# Patient Record
Sex: Male | Born: 1950 | Race: Black or African American | Hispanic: No | Smoking: Current some day smoker
Health system: Southern US, Community
[De-identification: ages and names within clinical notes are randomized; demographics above are authoritative.]

## PROBLEM LIST (undated history)

## (undated) DIAGNOSIS — I272 Pulmonary hypertension, unspecified: Secondary | ICD-10-CM

## (undated) DIAGNOSIS — G4730 Sleep apnea, unspecified: Secondary | ICD-10-CM

## (undated) DIAGNOSIS — I4891 Unspecified atrial fibrillation: Secondary | ICD-10-CM

## (undated) DIAGNOSIS — G4733 Obstructive sleep apnea (adult) (pediatric): Secondary | ICD-10-CM

## (undated) DIAGNOSIS — E119 Type 2 diabetes mellitus without complications: Secondary | ICD-10-CM

## (undated) DIAGNOSIS — I4892 Unspecified atrial flutter: Secondary | ICD-10-CM

## (undated) DIAGNOSIS — I10 Essential (primary) hypertension: Secondary | ICD-10-CM

## (undated) DIAGNOSIS — I499 Cardiac arrhythmia, unspecified: Secondary | ICD-10-CM

## (undated) DIAGNOSIS — N1831 Chronic kidney disease, stage 3a: Secondary | ICD-10-CM

## (undated) DIAGNOSIS — K219 Gastro-esophageal reflux disease without esophagitis: Secondary | ICD-10-CM

## (undated) HISTORY — DX: Pulmonary hypertension, unspecified: I27.20

## (undated) HISTORY — DX: Type 2 diabetes mellitus without complications: E11.9

## (undated) HISTORY — DX: Chronic kidney disease, stage 3a: N18.31

## (undated) HISTORY — DX: Unspecified atrial flutter: I48.92

## (undated) HISTORY — DX: Morbid (severe) obesity due to excess calories: E66.01

## (undated) HISTORY — PX: FINGER SURGERY: SHX640

## (undated) HISTORY — DX: Gastro-esophageal reflux disease without esophagitis: K21.9

## (undated) HISTORY — DX: Obstructive sleep apnea (adult) (pediatric): G47.33

---

## 2001-04-01 ENCOUNTER — Encounter: Payer: Self-pay | Admitting: Emergency Medicine

## 2001-04-01 ENCOUNTER — Inpatient Hospital Stay (HOSPITAL_COMMUNITY): Admission: EM | Admit: 2001-04-01 | Discharge: 2001-04-04 | Payer: Self-pay | Admitting: Cardiovascular Disease

## 2003-02-15 ENCOUNTER — Emergency Department (HOSPITAL_COMMUNITY): Admission: EM | Admit: 2003-02-15 | Discharge: 2003-02-15 | Payer: Self-pay

## 2004-01-07 ENCOUNTER — Emergency Department (HOSPITAL_COMMUNITY): Admission: EM | Admit: 2004-01-07 | Discharge: 2004-01-07 | Payer: Self-pay | Admitting: *Deleted

## 2004-07-20 ENCOUNTER — Ambulatory Visit: Payer: Self-pay | Admitting: Cardiology

## 2004-07-20 ENCOUNTER — Inpatient Hospital Stay (HOSPITAL_COMMUNITY): Admission: EM | Admit: 2004-07-20 | Discharge: 2004-07-21 | Payer: Self-pay | Admitting: Emergency Medicine

## 2004-07-21 ENCOUNTER — Ambulatory Visit: Payer: Self-pay | Admitting: Cardiology

## 2005-12-17 ENCOUNTER — Emergency Department (HOSPITAL_COMMUNITY): Admission: EM | Admit: 2005-12-17 | Discharge: 2005-12-17 | Payer: Self-pay | Admitting: Emergency Medicine

## 2006-03-31 ENCOUNTER — Emergency Department (HOSPITAL_COMMUNITY): Admission: EM | Admit: 2006-03-31 | Discharge: 2006-03-31 | Payer: Self-pay | Admitting: Emergency Medicine

## 2011-02-18 ENCOUNTER — Emergency Department (HOSPITAL_COMMUNITY): Payer: BC Managed Care – PPO

## 2011-02-18 ENCOUNTER — Encounter: Payer: Self-pay | Admitting: Emergency Medicine

## 2011-02-18 ENCOUNTER — Emergency Department (HOSPITAL_COMMUNITY)
Admission: EM | Admit: 2011-02-18 | Discharge: 2011-02-18 | Disposition: A | Discharge status: Home / Self Care | Payer: BC Managed Care – PPO | Attending: Emergency Medicine | Admitting: Emergency Medicine

## 2011-02-18 DIAGNOSIS — R05 Cough: Secondary | ICD-10-CM | POA: Insufficient documentation

## 2011-02-18 DIAGNOSIS — R1310 Dysphagia, unspecified: Secondary | ICD-10-CM | POA: Insufficient documentation

## 2011-02-18 DIAGNOSIS — R6889 Other general symptoms and signs: Secondary | ICD-10-CM | POA: Insufficient documentation

## 2011-02-18 DIAGNOSIS — J3489 Other specified disorders of nose and nasal sinuses: Secondary | ICD-10-CM | POA: Insufficient documentation

## 2011-02-18 DIAGNOSIS — I4891 Unspecified atrial fibrillation: Secondary | ICD-10-CM | POA: Insufficient documentation

## 2011-02-18 DIAGNOSIS — R509 Fever, unspecified: Secondary | ICD-10-CM | POA: Insufficient documentation

## 2011-02-18 DIAGNOSIS — J40 Bronchitis, not specified as acute or chronic: Secondary | ICD-10-CM

## 2011-02-18 DIAGNOSIS — R059 Cough, unspecified: Secondary | ICD-10-CM | POA: Insufficient documentation

## 2011-02-18 DIAGNOSIS — Z79899 Other long term (current) drug therapy: Secondary | ICD-10-CM | POA: Insufficient documentation

## 2011-02-18 DIAGNOSIS — I10 Essential (primary) hypertension: Secondary | ICD-10-CM | POA: Insufficient documentation

## 2011-02-18 DIAGNOSIS — K219 Gastro-esophageal reflux disease without esophagitis: Secondary | ICD-10-CM | POA: Insufficient documentation

## 2011-02-18 HISTORY — DX: Essential (primary) hypertension: I10

## 2011-02-18 HISTORY — DX: Unspecified atrial fibrillation: I48.91

## 2011-02-18 MED ORDER — PANTOPRAZOLE SODIUM 20 MG PO TBEC: 20.0000 mg | DELAYED_RELEASE_TABLET | Freq: Every day | ORAL | Status: DC

## 2011-02-18 NOTE — ED Notes (Signed)
Pt c/o intermittent cough/congestion/fever/chills/insomnia "since summertime". nad noted.

## 2011-02-18 NOTE — ED Provider Notes (Signed)
History   This chart was scribed for Benny Lennert, MD scribed by Magnus Sinning. The patient was seen in room APA03/[facility]03    CSN: [id number] Arrival date & time: 02/18/2011 10:16 AM   First MD Initiated Contact with Patient 02/18/11 1156      Chief Complaint  Patient presents with  . Cough  . Nasal Congestion    (Consider location/radiation/quality/duration/timing/severity/associated sxs/prior treatment) HPI Kevin Goodman is a 60 y.o. male who presents to the Emergency Department complaining of constant moderate difficulty swallowing with associated productive cough and subjective fever. Pt reports that he has been experiencing feelings of "things getting stuck" in his throat since the summer, but that it has worsened over the week. Denies abdominal pain, and vomiting. Pt does not currently smoke.   Past Medical History  Diagnosis Date  . Hypertension   . Atrial fibrillation     Past Surgical History  Procedure Date  . Finger surgery     left ring    History reviewed. No pertinent family history.  History  Substance Use Topics  . Smoking status: Not on file  . Smokeless tobacco: Not on file  . Alcohol Use: No      Review of Systems  Constitutional: Positive for fever. Negative for fatigue.  HENT: Positive for trouble swallowing. Negative for congestion, sinus pressure and ear discharge.   Eyes: Negative for discharge.  Respiratory: Positive for cough.   Cardiovascular: Negative for chest pain.  Gastrointestinal: Negative for vomiting, abdominal pain and diarrhea.  Genitourinary: Negative for frequency and hematuria.  Musculoskeletal: Negative for back pain.  Skin: Negative for rash.  Neurological: Negative for seizures and headaches.  Hematological: Negative.   Psychiatric/Behavioral: Negative for hallucinations.    Allergies  Penicillins  Home Medications   Current Outpatient Rx  Name Route Sig Dispense Refill  . VICKS NYQUIL COUGH PO Oral Take 1  capsule by mouth at bedtime as needed. For cough     . GUAIFENESIN ER 600 MG PO TB12 Oral Take 1,200 mg by mouth 2 (two) times daily.      Marland Kitchen ALKA-SELTZER PLUS COLD & COUGH PO Oral Take 1 tablet by mouth 2 (two) times daily.      Marland Kitchen PANTOPRAZOLE SODIUM 20 MG PO TBEC Oral Take 1 tablet (20 mg total) by mouth daily. 30 tablet 1    BP 173/98  Pulse 74  Temp 98.2 F (36.8 C)  Resp 20  Ht 6\' 1"  (1.854 m)  Wt 360 lb (163.295 kg)  BMI 47.50 kg/m2  SpO2 98%  Physical Exam  Nursing note and vitals reviewed. Constitutional: He is oriented to person, place, and time. He appears well-developed and well-nourished. No distress.  HENT:  Head: Normocephalic and atraumatic.  Mouth/Throat: Oropharynx is clear and moist. No oropharyngeal exudate.  Eyes: Conjunctivae and EOM are normal. No scleral icterus.  Neck: Neck supple. No thyromegaly present.  Cardiovascular: Normal rate and regular rhythm.  Exam reveals no gallop and no friction rub.   No murmur heard. Pulmonary/Chest: No stridor. He has no wheezes. He has no rales. He exhibits no tenderness.  Abdominal: He exhibits no distension. There is no tenderness. There is no rebound.  Musculoskeletal: Normal range of motion. He exhibits no edema.  Lymphadenopathy:    He has no cervical adenopathy.  Neurological: He is oriented to person, place, and time. Coordination normal.  Skin: No rash noted. No erythema.  Psychiatric: He has a normal mood and affect. His behavior is normal.  ED Course  Procedures (including critical care time) DIAGNOSTIC STUDIES: Oxygen Saturation is 99% on room air, normal by my interpretation.    COORDINATION OF CARE: 2:17 PM-Physician reviewed and explained imaging results with patient and entertained questions. Patient informed of intent to d/c home and agrees with plan of action set at this time.   Dg Chest 2 View  02/18/2011  *RADIOLOGY REPORT*  Clinical Data: Shortness of breath.  CHEST - 2 VIEW  Comparison:  12/17/2005  Findings: Heart and mediastinal contours are within normal limits. No focal opacities or effusions.  No acute bony abnormality.  IMPRESSION: No active cardiopulmonary disease.  Original Report Authenticated By: Cyndie Chime, M.D.     1. Bronchitis   2. GERD (gastroesophageal reflux disease)                Benny Lennert, MD 02/18/11 [phone]

## 2011-07-16 ENCOUNTER — Emergency Department (HOSPITAL_COMMUNITY): Payer: BC Managed Care – PPO

## 2011-07-16 ENCOUNTER — Inpatient Hospital Stay (HOSPITAL_COMMUNITY)
Admission: EM | Admit: 2011-07-16 | Discharge: 2011-07-20 | DRG: 139 | Disposition: A | Discharge status: Home / Self Care | Payer: BC Managed Care – PPO | Attending: Family Medicine | Admitting: Family Medicine

## 2011-07-16 ENCOUNTER — Encounter (HOSPITAL_COMMUNITY): Payer: Self-pay | Admitting: *Deleted

## 2011-07-16 DIAGNOSIS — I4891 Unspecified atrial fibrillation: Principal | ICD-10-CM

## 2011-07-16 DIAGNOSIS — E785 Hyperlipidemia, unspecified: Secondary | ICD-10-CM | POA: Diagnosis present

## 2011-07-16 DIAGNOSIS — I10 Essential (primary) hypertension: Secondary | ICD-10-CM

## 2011-07-16 DIAGNOSIS — Z9119 Patient's noncompliance with other medical treatment and regimen: Secondary | ICD-10-CM

## 2011-07-16 DIAGNOSIS — E739 Lactose intolerance, unspecified: Secondary | ICD-10-CM | POA: Diagnosis present

## 2011-07-16 DIAGNOSIS — E66813 Obesity, class 3: Secondary | ICD-10-CM

## 2011-07-16 DIAGNOSIS — Z91199 Patient's noncompliance with other medical treatment and regimen due to unspecified reason: Secondary | ICD-10-CM

## 2011-07-16 DIAGNOSIS — IMO0002 Reserved for concepts with insufficient information to code with codable children: Secondary | ICD-10-CM

## 2011-07-16 DIAGNOSIS — E119 Type 2 diabetes mellitus without complications: Secondary | ICD-10-CM

## 2011-07-16 DIAGNOSIS — Z6841 Body Mass Index (BMI) 40.0 and over, adult: Secondary | ICD-10-CM

## 2011-07-16 LAB — CBC
Hemoglobin: 14.6 g/dL (ref 13.0–17.0)
Platelets: 246 10*3/uL (ref 150–400)
RBC: 4.9 MIL/uL (ref 4.22–5.81)
WBC: 11.6 10*3/uL — ABNORMAL HIGH (ref 4.0–10.5)

## 2011-07-16 LAB — URINALYSIS, ROUTINE W REFLEX MICROSCOPIC
Bilirubin Urine: NEGATIVE
Glucose, UA: NEGATIVE mg/dL
Ketones, ur: NEGATIVE mg/dL
pH: 6 (ref 5.0–8.0)

## 2011-07-16 LAB — POCT I-STAT, CHEM 8
Creatinine, Ser: 1.2 mg/dL (ref 0.50–1.35)
Hemoglobin: 16 g/dL (ref 13.0–17.0)
Potassium: 3.8 mEq/L (ref 3.5–5.1)
Sodium: 142 mEq/L (ref 135–145)

## 2011-07-16 LAB — COMPREHENSIVE METABOLIC PANEL
ALT: 27 U/L (ref 0–53)
AST: 24 U/L (ref 0–37)
Alkaline Phosphatase: 78 U/L (ref 39–117)
CO2: 24 mEq/L (ref 19–32)
Chloride: 102 mEq/L (ref 96–112)
GFR calc non Af Amer: 61 mL/min — ABNORMAL LOW (ref >90)
Sodium: 140 mEq/L (ref 135–145)
Total Bilirubin: 0.3 mg/dL (ref 0.3–1.2)

## 2011-07-16 LAB — URINE MICROSCOPIC-ADD ON

## 2011-07-16 LAB — POCT I-STAT TROPONIN I

## 2011-07-16 MED ORDER — SODIUM CHLORIDE 0.9 % IV BOLUS (SEPSIS)
500.0000 mL | Freq: Once | INTRAVENOUS | Status: AC
  Administered 2011-07-16: 500 mL via INTRAVENOUS

## 2011-07-16 MED ORDER — ENOXAPARIN SODIUM 100 MG/ML ~~LOC~~ SOLN
1.0000 mg/kg | Freq: Two times a day (BID) | SUBCUTANEOUS | Status: DC
  Administered 2011-07-16 – 2011-07-20 (×8): 170 mg via SUBCUTANEOUS
  Filled 2011-07-16: qty 1.7
  Filled 2011-07-16 (×5): qty 2
  Filled 2011-07-16: qty 1.7
  Filled 2011-07-16 (×3): qty 2
  Filled 2011-07-16: qty 1.7

## 2011-07-16 MED ORDER — DILTIAZEM HCL 50 MG/10ML IV SOLN
20.0000 mg | Freq: Once | INTRAVENOUS | Status: DC
  Filled 2011-07-16: qty 4

## 2011-07-16 MED ORDER — ZOLPIDEM TARTRATE 5 MG PO TABS
5.0000 mg | ORAL_TABLET | Freq: Every evening | ORAL | Status: DC | PRN
  Administered 2011-07-17: 5 mg via ORAL
  Filled 2011-07-16: qty 1

## 2011-07-16 MED ORDER — SODIUM CHLORIDE 0.9 % IV BOLUS (SEPSIS)
500.0000 mL | Freq: Once | INTRAVENOUS | Status: AC
  Administered 2011-07-16: 17:00:00 via INTRAVENOUS

## 2011-07-16 MED ORDER — ONDANSETRON HCL 4 MG/2ML IJ SOLN: 4.0000 mg | Freq: Four times a day (QID) | INTRAMUSCULAR | Status: DC | PRN

## 2011-07-16 MED ORDER — SODIUM CHLORIDE 0.9 % IV SOLN
INTRAVENOUS | Status: DC
  Administered 2011-07-16: 14:00:00 via INTRAVENOUS

## 2011-07-16 MED ORDER — DILTIAZEM HCL 25 MG/5ML IV SOLN
20.0000 mg | Freq: Once | INTRAVENOUS | Status: AC
  Administered 2011-07-16: 20 mg via INTRAVENOUS
  Filled 2011-07-16: qty 5

## 2011-07-16 MED ORDER — MORPHINE SULFATE 2 MG/ML IJ SOLN
1.0000 mg | INTRAMUSCULAR | Status: DC | PRN
  Administered 2011-07-17: 1 mg via INTRAVENOUS
  Filled 2011-07-16: qty 1

## 2011-07-16 MED ORDER — ASPIRIN 81 MG PO CHEW
324.0000 mg | CHEWABLE_TABLET | Freq: Once | ORAL | Status: AC
  Administered 2011-07-16: 324 mg via ORAL
  Filled 2011-07-16: qty 4

## 2011-07-16 MED ORDER — DILTIAZEM HCL 100 MG IV SOLR
5.0000 mg/h | INTRAVENOUS | Status: DC
  Administered 2011-07-16: 15 mg/h via INTRAVENOUS
  Administered 2011-07-16: 5 mg/h via INTRAVENOUS
  Administered 2011-07-17 (×2): 15 mg/h via INTRAVENOUS
  Filled 2011-07-16: qty 100

## 2011-07-16 MED ORDER — WARFARIN - PHYSICIAN DOSING INPATIENT: Freq: Every day | Status: DC

## 2011-07-16 MED ORDER — WARFARIN SODIUM 5 MG PO TABS
5.0000 mg | ORAL_TABLET | Freq: Every day | ORAL | Status: DC
  Administered 2011-07-17 – 2011-07-18 (×2): 5 mg via ORAL
  Filled 2011-07-16 (×2): qty 1

## 2011-07-16 NOTE — ED Notes (Signed)
Stated feeling much better, heart rate remains 120's, increased drip rate of diltiazem to 12.5

## 2011-07-16 NOTE — ED Notes (Signed)
Resting in bed, voices no complaints.  

## 2011-07-16 NOTE — ED Provider Notes (Cosign Needed)
History     CSN: [id number]  Arrival date & time 07/16/11  1310   First MD Initiated Contact with Patient 07/16/11 1321      Chief Complaint  Patient presents with  . Chest Pain    (Consider location/radiation/quality/duration/timing/severity/associated sxs/prior treatment) HPI Comments: Kevin Goodman is a 61 y.o. Male who reports palpitations today, while at work. He also complains of chest pain today; described as a fluttering sensation. He has had dyspnea on exertion for several days and a generalized malaise. His doctor took him off Coumadin several months ago, because his atrial fibrillation had abated. He denies recent cough, nausea, vomiting, shortness of breath, change in bowel or urinary habits. His only current home medicine is saline nasal spray. He has not tried any medicines or treatment for the problem today.  The history is provided by the patient.    Past Medical History  Diagnosis Date  . Hypertension   . Atrial fibrillation     Past Surgical History  Procedure Date  . Finger surgery     left ring    History reviewed. No pertinent family history.  History  Substance Use Topics  . Smoking status: Never Smoker   . Smokeless tobacco: Not on file  . Alcohol Use: No      Review of Systems  All other systems reviewed and are negative.    Allergies  Shrimp and Penicillins  Home Medications   Current Outpatient Rx  Name Route Sig Dispense Refill  . SALINE NASAL SPRAY 0.65 % NA SOLN Nasal Place 1 spray into the nose as needed. Congestion/Allergies      BP 132/86  Pulse 102  Temp 98.8 F (37.1 C)  Resp 20  Ht 6\' 1"  (1.854 m)  Wt 370 lb (167.831 kg)  BMI 48.82 kg/m2  SpO2 96%  Physical Exam  Nursing note and vitals reviewed. Constitutional: He is oriented to person, place, and time. He appears well-developed.       He is obese  HENT:  Head: Normocephalic and atraumatic.  Right Ear: External ear normal.  Left Ear: External ear normal.   Eyes: Conjunctivae and EOM are normal. Pupils are equal, round, and reactive to light.  Neck: Normal range of motion and phonation normal. Neck supple.  Cardiovascular: Normal heart sounds and intact distal pulses.        Irregularly irregular tachycardia  Pulmonary/Chest: Effort normal and breath sounds normal. He exhibits no bony tenderness.  Abdominal: Soft. Normal appearance. There is no tenderness.  Musculoskeletal: Normal range of motion. He exhibits no edema and no tenderness.  Neurological: He is alert and oriented to person, place, and time. He has normal strength. No cranial nerve deficit or sensory deficit. He exhibits normal muscle tone. Coordination normal.  Skin: Skin is warm, dry and intact.  Psychiatric: He has a normal mood and affect. His behavior is normal. Judgment and thought content normal.    ED Course  Procedures (including critical care time)  13:50-Emergency department treatment: IV Cardizem drip and bolus; heart rate improved to 110 with blood pressure 137/85.  16:20- BP 132/86 with HR 128-135. Lopressor and second IV bolus ordered. ordered.   17:25- BP 125/109 with HR 113. Pt remains comfortable.      Date: 07/16/2011  Rate: 150  Rhythm: atrial fibrillation  QRS Axis: normal  Intervals: normal QT  ST/T Wave abnormalities: normal  Conduction Disutrbances:incomplete RBBB  Narrative Interpretation:   Old EKG Reviewed: changes noted- conversion to AF with RVR  CRITICAL CARE Performed by: Flint Melter   Total critical care time: 75  Critical care time was exclusive of separately billable procedures and treating other patients.  Critical care was necessary to treat or prevent imminent or life-threatening deterioration.  Critical care was time spent personally by me on the following activities: development of treatment plan with patient and/or surrogate as well as nursing, discussions with consultants, evaluation of patient's response to treatment,  examination of patient, obtaining history from patient or surrogate, ordering and performing treatments and interventions, ordering and review of laboratory studies, ordering and review of radiographic studies, pulse oximetry and re-evaluation of patient's condition.    Labs Reviewed  CBC - Abnormal; Notable for the following:    WBC 11.6 (*)    All other components within normal limits  COMPREHENSIVE METABOLIC PANEL - Abnormal; Notable for the following:    Glucose, Bld 152 (*)    GFR calc non Af Amer 61 (*)    GFR calc Af Amer 71 (*)    All other components within normal limits  URINALYSIS, ROUTINE W REFLEX MICROSCOPIC - Abnormal; Notable for the following:    Hgb urine dipstick TRACE (*)    Leukocytes, UA SMALL (*)    All other components within normal limits  POCT I-STAT, CHEM 8 - Abnormal; Notable for the following:    Glucose, Bld 152 (*)    All other components within normal limits  URINE MICROSCOPIC-ADD ON - Abnormal; Notable for the following:    Squamous Epithelial / LPF FEW (*)    All other components within normal limits  PROTIME-INR  PRO B NATRIURETIC PEPTIDE  APTT  POCT I-STAT TROPONIN I  URINE CULTURE   Dg Chest Port 1 View  07/16/2011  *RADIOLOGY REPORT*  Clinical Data: Chest pain  PORTABLE CHEST - 1 VIEW  Comparison: 02/18/2011  Findings: The study is limited by poor inspiration.  Cardiac size is stable.  No acute infiltrate or pleural effusion.  No pulmonary edema.  Mild basilar atelectasis.  IMPRESSION: Limited study by poor inspiration.  No pulmonary edema.  Bilateral basilar atelectasis.  Original Report Authenticated By: Natasha Mead, M.D.     1. Atrial fibrillation with RVR       MDM  Recurrent atrial fibrillation with RVR.  No clear provocation. Urinalysis, slightly abnormal, urine culture sent. No UTI symptoms. Patient has been treated and is improved in the emergency department. He needs to be admitted for stabilization. He will likely need to be  anticoagulated.   Plan: Admit- to PCP service        Flint Melter, MD 07/16/11 1740

## 2011-07-16 NOTE — ED Notes (Signed)
Pt resting in bed, resp even.  Wife at bedside.

## 2011-07-16 NOTE — H&P (Signed)
NAME:  Kevin Goodman, POPOFF              ACCOUNT NO.:  [id number]  MEDICAL RECORD NO.:  [id number]  LOCATION:  IC04                          FACILITY:  [facility]  PHYSICIAN:  Jakyah Bradby G. Renard Matter, MD   DATE OF BIRTH:  [birth year]  DATE OF ADMISSION:  07/16/2011 DATE OF DISCHARGE:  LH                             HISTORY & PHYSICAL   This patient has a history of atrial fibrillation in the past and was taken off of anticoagulation because of normal sinus rhythm.  States that while at work, he developed a fluttering sensation in his heart and some shortness of breath, and "palpitations."  He presented to the emergency room where he was seen by ED physician, and found to be in atrial fibrillation with rapid ventricular rate.  His electrocardiogram only showed atrial fibrillation with a rate of 150.  He was placed on Cardizem drip, and subsequently admitted.  Lab studies were essentially within normal range.  Chemistries normal.  Urinalysis negative.  A random glucose was 186.  SOCIAL HISTORY:  The patient never smoked or used alcohol.  PAST MEDICAL HISTORY: 1. Hypertension. 2. Atrial fibrillation. 3. Prior history of surgery on left ring finger.  ALLERGIES:  To SHRIMP and PENICILLIN.  HOME MEDICATIONS:  On a nasal spray.  REVIEW OF SYSTEMS:  HEENT:  Negative.  Cardiopulmonary:  The patient had irregular heartbeat on admission with slight chest pain, anterior chest. GI:  No bowel irregularity or bleeding.  GU:  No dysuria, hematuria.  PHYSICAL EXAMINATION:  VITAL SIGNS:  Alert male with blood pressure 132/86, pulse rate at present 112, respirations 20, temp 98.8. HEENT:  PERRLA.  TM negative.  Oropharynx benign. NECK:  Supple.  No JVD or thyroid abnormalities. HEART:  Irregular rhythm with a rate currently of 107. LUNGS:  Clear to P and A. ABDOMEN:  No palpable organs or masses.  No organomegaly. EXTREMITIES:  Free of edema.  ASSESSMENT:  The patient was admitted with new-onset atrial  fibrillation with rapid ventricular rate.  PLAN:  To continue the Cardizem drip this evening with possible transition to p.o. medication.  In a.m., we will check hemoglobin A1c and blood sugar in the morning.     Abdulrahim Siddiqi G. Renard Matter, MD     AGM/MEDQ  D:  07/16/2011  T:  07/16/2011  Job:  [id number]

## 2011-07-16 NOTE — ED Notes (Signed)
heartrate continuously above 130, currently 154, increased ditiazem drip to 10

## 2011-07-16 NOTE — ED Notes (Signed)
Lt chest pain, x 10 min . Had push mowed lawn this am.  NO nausea, Sob,  Sweating.

## 2011-07-17 LAB — GLUCOSE, CAPILLARY
Glucose-Capillary: 197 mg/dL — ABNORMAL HIGH (ref 70–99)
Glucose-Capillary: 111 mg/dL — ABNORMAL HIGH (ref 70–99)

## 2011-07-17 LAB — HEMOGLOBIN A1C
Hgb A1c MFr Bld: 7.6 % — ABNORMAL HIGH (ref <5.7)
Mean Plasma Glucose: 171 mg/dL — ABNORMAL HIGH (ref <117)

## 2011-07-17 LAB — CARDIAC PANEL(CRET KIN+CKTOT+MB+TROPI): Troponin I: <0.3 ng/mL (ref <0.30)

## 2011-07-17 LAB — PROTIME-INR
INR: 1.11 (ref 0.00–1.49)
Prothrombin Time: 14.5 seconds (ref 11.6–15.2)

## 2011-07-17 MED ORDER — AMIODARONE LOAD VIA INFUSION
150.0000 mg | Freq: Once | INTRAVENOUS | Status: AC
  Administered 2011-07-17: 150 mg via INTRAVENOUS
  Filled 2011-07-17: qty 83.34

## 2011-07-17 MED ORDER — LORAZEPAM 2 MG/ML IJ SOLN
1.0000 mg | INTRAMUSCULAR | Status: DC | PRN
  Administered 2011-07-17 – 2011-07-20 (×6): 1 mg via INTRAVENOUS
  Filled 2011-07-17 (×5): qty 1

## 2011-07-17 MED ORDER — DILTIAZEM HCL 100 MG IV SOLR
5.0000 mg/h | INTRAVENOUS | Status: DC
  Administered 2011-07-17: 5 mg/h via INTRAVENOUS
  Filled 2011-07-17: qty 100

## 2011-07-17 MED ORDER — LORAZEPAM 2 MG/ML IJ SOLN: 0.5000 mg | Freq: Once | INTRAMUSCULAR | Status: DC

## 2011-07-17 MED ORDER — SIMETHICONE 80 MG PO CHEW
40.0000 mg | CHEWABLE_TABLET | Freq: Four times a day (QID) | ORAL | Status: DC | PRN
  Administered 2011-07-17 (×2): 40 mg via ORAL
  Filled 2011-07-17 (×2): qty 1

## 2011-07-17 MED ORDER — AMIODARONE HCL IN DEXTROSE 360-4.14 MG/200ML-% IV SOLN
30.0000 mg/h | INTRAVENOUS | Status: DC
  Administered 2011-07-17: 30 mg/h via INTRAVENOUS
  Administered 2011-07-17: 60 mg/h
  Administered 2011-07-18 (×2): 30 mg/h via INTRAVENOUS
  Filled 2011-07-17 (×2): qty 200

## 2011-07-17 MED ORDER — METOPROLOL SUCCINATE ER 50 MG PO TB24
50.0000 mg | ORAL_TABLET | Freq: Every day | ORAL | Status: DC
  Administered 2011-07-17 – 2011-07-20 (×4): 50 mg via ORAL
  Filled 2011-07-17 (×4): qty 1

## 2011-07-17 MED ORDER — WARFARIN VIDEO
Freq: Once | Status: AC
  Administered 2011-07-17: 12:00:00

## 2011-07-17 MED ORDER — COUMADIN BOOK
Freq: Once | Status: AC
  Administered 2011-07-17: 1
  Filled 2011-07-17: qty 1

## 2011-07-17 MED ORDER — LORAZEPAM 2 MG/ML IJ SOLN
INTRAMUSCULAR | Status: AC
  Filled 2011-07-17: qty 1

## 2011-07-17 MED ORDER — AMIODARONE HCL IN DEXTROSE 360-4.14 MG/200ML-% IV SOLN
60.0000 mg/h | INTRAVENOUS | Status: AC
  Administered 2011-07-17 (×2): 60 mg/h via INTRAVENOUS
  Filled 2011-07-17 (×2): qty 200

## 2011-07-17 NOTE — Progress Notes (Signed)
0320-called MD regarding restlessness and anxiety.

## 2011-07-17 NOTE — Progress Notes (Signed)
HR CONTINUES TO BE 130'S TO 150'S DESPITE CARDIZEM DRIP AT MG/HR. DR [facility] CALLED AND INFORMED. AMIODARONE  150MG  IV BOLUS GIVEN THEN DRIP STARTED AT 60MG /HR FOR 24HR PER PROTOCOL.Marland KitchenCARDIZEM DRIP STOPPED.

## 2011-07-17 NOTE — Plan of Care (Signed)
Problem: Phase I Progression Outcomes Goal: Hemodynamically stable Outcome: Progressing On titratable cardizem drip Goal: Voiding-avoid urinary catheter unless indicated Outcome: Completed/Met Date Met:  07/17/11 Using urinal

## 2011-07-17 NOTE — Progress Notes (Signed)
0115-called E-link MD regarding pt complaints of belching and burning of the throat.

## 2011-07-17 NOTE — Progress Notes (Signed)
eLink Physician-Brief Progress Note Patient Name: Kevin Goodman DOB: [birth year] MRN: [id number]  Date of Service  07/17/2011   HPI/Events of Note  A fib RVR despite amio   eICU Interventions  Start cardizem gtt   Intervention Category Intermediate Interventions: Arrhythmia - evaluation and management  Demontray Franta 07/17/2011, 9:43 PM

## 2011-07-17 NOTE — Progress Notes (Signed)
2115-contacted E-link MD regarding continuation of HR in the upper 140's to 150's. Orders to follow.

## 2011-07-17 NOTE — Progress Notes (Signed)
PT GIVEN INFORMATION BOOKLET ON TAKING COUMADIN. HE IS NOW WATCHING THE "TAKING A BLOOD THINNER" VIDEO

## 2011-07-18 LAB — URINE CULTURE
Colony Count: 80000
Culture  Setup Time: 201305110256

## 2011-07-18 LAB — CARDIAC PANEL(CRET KIN+CKTOT+MB+TROPI)
Relative Index: 1.7 (ref 0.0–2.5)
Total CK: 233 U/L — ABNORMAL HIGH (ref 7–232)
Troponin I: <0.3 ng/mL (ref <0.30)

## 2011-07-18 LAB — PROTIME-INR: INR: 1.11 (ref 0.00–1.49)

## 2011-07-18 MED ORDER — AMIODARONE HCL IN DEXTROSE 360-4.14 MG/200ML-% IV SOLN
INTRAVENOUS | Status: AC
  Filled 2011-07-18: qty 200

## 2011-07-18 NOTE — Progress Notes (Signed)
NAME:  Kevin Goodman, Kevin Goodman              ACCOUNT NO.:  [id number]  MEDICAL RECORD NO.:  [id number]  LOCATION:  IC04                          FACILITY:  [facility]  PHYSICIAN:  Ruthanna Macchia G. Renard Matter, MD   DATE OF BIRTH:  11/13/1950  DATE OF PROCEDURE: DATE OF DISCHARGE:                                PROGRESS NOTE   This patient has been treated for atrial fibrillation with rapid ventricular response.  He has had to be placed on amiodarone drip as well as Cardizem and his rate has come down.  OBJECTIVE:  VITAL SIGNS:  Blood pressure 122/71, respirations 22, pulse 67, and temp 98. LUNGS:  Diminished breath sounds. HEART:  Irregular rhythm. ABDOMEN:  No palpable organs or masses.  The patient did have repeat cardiac markers today which showed CK 233, CK-MB 3.9, troponin less than 0.3, relative index 1.7.  ASSESSMENT:  The patient was admitted with atrial fibrillation with rapid ventricular response.  He had some chest pain, although cardiac markers remained within normal limits.  PLAN:  To continue current regimen and we will go ahead and start warfarin.  We will have pharmacy to dose.     Rawleigh Rode G. Renard Matter, MD     AGM/MEDQ  D:  07/18/2011  T:  07/18/2011  Job:  [id number]

## 2011-07-18 NOTE — Progress Notes (Signed)
PT REMAINS IN NORMAL SINUS RHYTHM.

## 2011-07-18 NOTE — Progress Notes (Signed)
Pt needs a repeat sleep study ; he had one before but could never sleep so he was told to return which he never did. He has periods of apnea and de sats while asleep requiring some o2.  Also snores very loudly.

## 2011-07-18 NOTE — Progress Notes (Signed)
PT HAS BEEN IN NORMAL SINUS RHYTHM ALL MORNING. CARDIZEM DRIP HAS BEEN AT 5MG /HR.   CARDIZEM DRIP TURNED OFF AT THIS TIME.

## 2011-07-19 ENCOUNTER — Encounter (HOSPITAL_COMMUNITY): Payer: Self-pay | Admitting: Adult Health

## 2011-07-19 DIAGNOSIS — I4891 Unspecified atrial fibrillation: Principal | ICD-10-CM

## 2011-07-19 DIAGNOSIS — I10 Essential (primary) hypertension: Secondary | ICD-10-CM

## 2011-07-19 DIAGNOSIS — I517 Cardiomegaly: Secondary | ICD-10-CM

## 2011-07-19 LAB — LIPID PANEL: HDL: 38 mg/dL — ABNORMAL LOW (ref >39)

## 2011-07-19 LAB — TSH: TSH: 3.648 u[IU]/mL (ref 0.350–4.500)

## 2011-07-19 LAB — CARDIAC PANEL(CRET KIN+CKTOT+MB+TROPI): CK, MB: 2.9 ng/mL (ref 0.3–4.0)

## 2011-07-19 LAB — MAGNESIUM: Magnesium: 2 mg/dL (ref 1.5–2.5)

## 2011-07-19 MED ORDER — METOPROLOL TARTRATE 25 MG PO TABS: 25.0000 mg | ORAL_TABLET | Freq: Two times a day (BID) | ORAL | Status: DC

## 2011-07-19 MED ORDER — HYDROCHLOROTHIAZIDE 12.5 MG PO CAPS
25.0000 mg | ORAL_CAPSULE | Freq: Every day | ORAL | Status: DC
Start: 2011-07-19 — End: 2011-07-19

## 2011-07-19 MED ORDER — ASPIRIN EC 81 MG PO TBEC
81.0000 mg | DELAYED_RELEASE_TABLET | Freq: Every day | ORAL | Status: DC
  Administered 2011-07-19 – 2011-07-20 (×2): 81 mg via ORAL
  Filled 2011-07-19 (×2): qty 1

## 2011-07-19 MED ORDER — DILTIAZEM HCL ER COATED BEADS 120 MG PO CP24
120.0000 mg | ORAL_CAPSULE | Freq: Two times a day (BID) | ORAL | Status: DC
  Administered 2011-07-19 – 2011-07-20 (×3): 120 mg via ORAL
  Filled 2011-07-19 (×3): qty 1

## 2011-07-19 MED ORDER — LISINOPRIL 10 MG PO TABS
5.0000 mg | ORAL_TABLET | Freq: Every day | ORAL | Status: DC
  Administered 2011-07-19 – 2011-07-20 (×2): 5 mg via ORAL
  Filled 2011-07-19 (×2): qty 1

## 2011-07-19 MED ORDER — WARFARIN - PHARMACIST DOSING INPATIENT
Freq: Every day | Status: DC
  Administered 2011-07-19: 18:00:00

## 2011-07-19 MED ORDER — DILTIAZEM HCL ER 60 MG PO CP12
120.0000 mg | ORAL_CAPSULE | Freq: Two times a day (BID) | ORAL | Status: DC
  Filled 2011-07-19: qty 2

## 2011-07-19 MED ORDER — ENOXAPARIN SODIUM 150 MG/ML ~~LOC~~ SOLN: 1.0000 mg/kg | Freq: Two times a day (BID) | SUBCUTANEOUS | Status: DC

## 2011-07-19 MED ORDER — HYDROCHLOROTHIAZIDE 12.5 MG PO CAPS: 12.5000 mg | ORAL_CAPSULE | Freq: Every day | ORAL | Status: DC

## 2011-07-19 MED ORDER — ENOXAPARIN SODIUM 150 MG/ML ~~LOC~~ SOLN: 170.0000 mg | Freq: Two times a day (BID) | SUBCUTANEOUS | Status: DC

## 2011-07-19 MED ORDER — WARFARIN SODIUM 10 MG PO TABS
10.0000 mg | ORAL_TABLET | Freq: Once | ORAL | Status: AC
  Administered 2011-07-19: 10 mg via ORAL
  Filled 2011-07-19: qty 1

## 2011-07-19 NOTE — Progress Notes (Signed)
*  PRELIMINARY RESULTS* Echocardiogram 2D Echocardiogram has been performed.  Kevin Goodman 07/19/2011, 9:51 AM

## 2011-07-19 NOTE — Progress Notes (Signed)
[id number] 

## 2011-07-19 NOTE — Progress Notes (Signed)
UR Chart Review Completed  

## 2011-07-19 NOTE — Plan of Care (Signed)
Problem: Phase II Progression Outcomes Goal: Hemodynamically stable Outcome: Progressing Patient remains on ammiodarone gtt. In NSR.  Pending transition to po medicines

## 2011-07-19 NOTE — Care Management Note (Unsigned)
    Page 1 of 1   07/19/2011     2:04:58 PM   CARE MANAGEMENT NOTE 07/19/2011  Patient:  Kevin Goodman, Kevin Goodman   Account Number:  [id number]  Date Initiated:  07/19/2011  Documentation initiated by:  Sharrie Rothman  Subjective/Objective Assessment:   Pt admitted from home with A fib with RVR. Pt lives with wife and is independent with ADL's PTA. Pt will return home at discharge.     Action/Plan:   CM explained importance of taking medications as prescribed and pt verbalized understanding. No CM needs noted. Will continue to monitor.   Anticipated DC Date:  07/23/2011   Anticipated DC Plan:  HOME/SELF CARE      DC Planning Services  CM consult      Choice offered to / List presented to:             Status of service:  In process, will continue to follow Medicare Important Message given?   (If response is "NO", the following Medicare IM given date fields will be blank) Date Medicare IM given:   Date Additional Medicare IM given:    Discharge Disposition:  HOME/SELF CARE  Per UR Regulation:    If discussed at Long Length of Stay Meetings, dates discussed:    Comments:  07/19/11 1400 Arlyss Queen, RN BSN CM

## 2011-07-19 NOTE — Progress Notes (Signed)
ANTICOAGULATION CONSULT NOTE - Initial Consult  Pharmacy Consult for Coumadin Indication: atrial fibrillation  Allergies  Allergen Reactions  . Shrimp (Shellfish Allergy) Anaphylaxis  . Penicillins Other (See Comments)    Childhood allergy    Patient Measurements: Height: 6\' 1"  (185.4 cm) Weight: 374 lb 12.5 oz (170 kg) IBW/kg (Calculated) : 79.9   Vital Signs: Temp: 98 F (36.7 C) (05/13 0800) Temp src: Oral (05/13 0800) BP: 131/76 mmHg (05/13 0944) Pulse Rate: 77  (05/13 0944)  Labs:  Basename 07/19/11 0458 07/18/11 0418 07/17/11 0513 07/16/11 1350 07/16/11 1325  HGB -- -- -- 16.0 14.6  HCT -- -- -- 47.0 44.2  PLT -- -- -- -- 246  APTT -- -- -- -- 33  LABPROT 14.0 14.5 14.5 -- --  INR 1.06 1.11 1.11 -- --  HEPARINUNFRC -- -- -- -- --  CREATININE -- -- -- 1.20 1.26  CKTOTAL 198 233* 377* -- --  CKMB 2.9 3.9 6.1* -- --  TROPONINI <0.30 <0.30 <0.30 -- --    Estimated Creatinine Clearance: 110 ml/min (by C-G formula based on Cr of 1.2).   Medical History: Past Medical History  Diagnosis Date  . Hypertension   . Atrial fibrillation   . Gout     Medications:  Scheduled:    . aspirin EC  81 mg Oral Daily  . diltiazem  120 mg Oral BID  . enoxaparin (LOVENOX) injection  1 mg/kg Subcutaneous Q12H  . hydrochlorothiazide  12.5 mg Oral Daily  . lisinopril  5 mg Oral Daily  . metoprolol succinate  50 mg Oral Daily  . warfarin  5 mg Oral q1800  . DISCONTD: diltiazem  120 mg Oral Q12H  . DISCONTD: enoxaparin (LOVENOX) injection  1 mg/kg Subcutaneous Q12H  . DISCONTD: enoxaparin (LOVENOX) injection  170 mg Subcutaneous Q12H  . DISCONTD: hydrochlorothiazide  25 mg Oral Daily  . DISCONTD: metoprolol tartrate  25 mg Oral BID  . DISCONTD: Warfarin - Physician Dosing Inpatient   Does not apply q1800    Assessment: 61 yo M admitted with new onset Afib. He was started on full dose Lovenox to bridge with warfarin 5mg . He has received 2 doses of warfarin. INR at  baseline.   No bleeding noted. Renal function stable. Warfarin score=10  Goal of Therapy:  INR 2-3 Anti-Xa level 0.6-1.2 units/ml 4hrs after LMWH dose given Monitor platelets by anticoagulation protocol: Yes   Plan:  1) Increase Coumadin 10mg  po x 1 today. 2) Daily INR. 3) Continue Lovenox per MD until INR at goal. 4) Check CBC q3days while on Lovenox. 5) Warfarin education initiated.  Elson Clan 07/19/2011,1:11 PM

## 2011-07-19 NOTE — Plan of Care (Signed)
Problem: Food- and Nutrition-Related Knowledge Deficit (NB-1.1) Goal: Nutrition education Formal process to instruct or train a patient/client in a skill or to impart knowledge to help patients/clients voluntarily manage or modify food choices and eating behavior to maintain or improve health.  Outcome: Completed/Met Date Met:  07/19/11 Knowledge deficit resolved 5/13 with diet education.   Comments:  Discussed and provided handouts obtained from ADA nutrition care manual for "Type 2 Diabetes Nutrition Therapy" and "General Healthful Nutrition". Patient stated he plans to check his blood sugar regularly and count carbohydrates. We also discussed food label reading. Patient was ver eager to listen and asked good questions. Patient is without any further nutrition related questions at this time. Expect fair compliance.  RD available for nutrition needs.  Jorma, [facility] [phone]

## 2011-07-19 NOTE — Progress Notes (Signed)
NAME:  Kevin Goodman, Kevin Goodman              ACCOUNT NO.:  [id number]  MEDICAL RECORD NO.:  [id number]  LOCATION:  IC04                          FACILITY:  [facility]  PHYSICIAN:  Melvyn Novas, MDDATE OF BIRTH:  [birth year]  DATE OF PROCEDURE:  07/19/2011 DATE OF DISCHARGE:                                PROGRESS NOTE   SUBJECTIVE:  The patient has history of adiposity, hypertension, glucose intolerance, and I believe hyperlipidemia, history of paroxysmal AFib previously according to H and P, who came in with AFib, rapid ventricular response.  Cardiac enzymes were essentially negative, was placed on Cardizem and amiodarone fusion, and subsequently reverted to sinus rhythm.  At present, no angina or angina equivalents.  The patient has stopped taking an antihypertensive medicine 2 months ago, unknown which it is.  SUBJECTIVE:  VITAL SIGNS:  Blood pressure 123/81, pulse 73 and regular sinus, temperature 98, O2 saturation 97%.  Cardiac enzymes negative. Hemoglobin 16. NECK:  No JVD. LUNGS:  Clear to A and P.  No rales, wheezes, or rhonchi. HEART:  Regular rhythm.  No murmurs, gallops, heaves, thrills, or rubs.  ASSESSMENT AND PLAN:  Right now, this Cardizem infusion is discontinued. We will start Cardizem 12 hour capsule 120 q.12, add low-dose Lopressor 25 p.o. b.i.d., get A1c, lipid profile, TSH.  Cardiology consult is requested and we will schedule echocardiogram.     Melvyn Novas, MD     RMD/MEDQ  D:  07/19/2011  T:  07/19/2011  Job:  [id number]

## 2011-07-19 NOTE — Consult Note (Signed)
CARDIOLOGY CONSULT NOTE  Patient ID: TAVI HOOGENDOORN MRN: [id number] DOB/AGE: 1950-06-14 61 y.o.  Admit date: 07/16/2011 Referring Physician: [name] Primary [name], MD, MD Primary Cardiologist: Feliberto Gottron (formerly [place])  Reason for Consultation: Atrial Fib with RVR Active Problems:  Atrial fibrillation or flutter  Hypertension  Morbid obesity  HPI: [name] is a 61 year old morbidly obese patient formerly seen by Dr. Clovis Riley was lost to followup 10 years ago with known history of hypertension and atrial fibrillation. He has a history of been on Coumadin in the past but this was discontinued approximately 10 years ago S. he remained in normal sinus rhythm. The patient has been in his usual state of health with the exception of not taking his blood pressure medicine as directed. If he runs out he usually waits to 3 months to get it filled. He was at work as a Electrical engineer and felt his heart beginning to flutter and race with associated diaphoresis. He states that earlier that day he had mowed his lawn and felt his heart rate go up and felt a little  flushed .Thought it was related to the yard work. After he took a shower he felt better. He endorses occasional racing heart with exertion which usually goes away at rest in the last few years. He endorses snoring along with 2-3 pillow orthopnea.     On arrival to the emergency room he was found to be in atrial fib flutter with RVR heart rate of 180 beats per minute blood pressure was 172/113. He was given Cardizem 20 mg IV bolus and begun on a Cardizem drip. He was also started on aspirin. Once in ICU as noted his heart rate did not respond well to the Cardizem. He was started on amiodarone drip. Converted to NSR. He has subsequently been placed on metoprolol and transitioned to po Cardizem. Amiodarone gtt is still infusing.      Review of labs reveals negative cardiac enzymes, essentially normal renal function with a  creatinine of 1.20 normal LFTs, mildly elevated blood glucose. Hemoglobin A1c was found elevated at 7.6, BNP 52.2. Chest x-ray was negative for CHF without evidence of cardiomegaly. He is currently still hypertensive. He is without complaint however.       Review of systems complete and found to be negative unless listed above   Past Medical History  Diagnosis Date  . Hypertension   . Atrial fibrillation   . Gout     Family History  Problem Relation Age of Onset  . Heart failure Mother   . Hypertension Mother   . Hypertension Father   . Stroke Father   . Stroke Brother     History   Social History  . Marital Status: Married    Spouse Name: N/A    Number of Children: N/A  . Years of Education: N/A   Occupational History  . Security    Social History Main Topics  . Smoking status: Former Smoker    Types: Cigars    Quit date: 07/15/2009  . Smokeless tobacco: Not on file  . Alcohol Use: Yes  . Drug Use: No  . Sexually Active: Yes   Other Topics Concern  . Not on file   Social History Narrative  . No narrative on file    Past Surgical History  Procedure Date  . Finger surgery     left ring     Prescriptions prior to admission  Medication Sig Dispense Refill  . lisinopril-hydrochlorothiazide (PRINZIDE,ZESTORETIC)  20-12.5 MG per tablet Take 1 tablet by mouth daily.      . sodium chloride (OCEAN) 0.65 % nasal spray Place 1 spray into the nose as needed. Congestion/Allergies        Physical Exam: Blood pressure 123/65, pulse 68, temperature 98 F (36.7 C), temperature source Oral, resp. rate 19, height 6\' 1"  (1.854 m), weight 374 lb 12.5 oz (170 kg), SpO2 97.00%.   General: Well developed, well nourished, in no acute distress, morbidly obese Head: Eyes PERRLA, No xanthomas.   Normal cephalic and atramatic  Lungs: Clear bilaterally to auscultation and percussion. Heart: HRRR S1 S2, without MRG.  Pulses are 2+ & equal.            No carotid bruit. No JVD.  No  abdominal bruits. No femoral bruits. Abdomen: Bowel sounds are positive, abdomen soft and non-tender without masses or                  Hernia's noted.Obese Msk:  Back normal, normal gait. Normal strength and tone for age. Extremities: No clubbing, cyanosis or edema.  DP +1 Neuro: Alert and oriented X 3. Psych:  Good affect, responds appropriately   Labs:   Lab Results  Component Value Date   WBC 11.6* 07/16/2011   HGB 16.0 07/16/2011   HCT 47.0 07/16/2011   MCV 90.2 07/16/2011   PLT 246 07/16/2011     Lab 07/16/11 1350 07/16/11 1325  NA 142 --  K 3.8 --  CL 105 --  CO2 -- 24  BUN 19 --  CREATININE 1.20 --  CALCIUM -- 10.4  PROT -- 7.1  BILITOT -- 0.3  ALKPHOS -- 78  ALT -- 27  AST -- 24  GLUCOSE 152* --   Lab Results  Component Value Date   CKTOTAL 198 07/19/2011   CKMB 2.9 07/19/2011   TROPONINI <0.30 07/19/2011        Radiology: IMPRESSION: Limited study by poor inspiration. No pulmonary edema. Bilateral  EKG: Admisison: Atrial flutter with variable A-V block Marked ST abnormality, possible inferior subendocardial injury Current : NSR with Biatrial enlargement, T-wave abnormality with poor R-wave progression. ICRBBB. Rate of 70 bpm.  ASSESSMENT AND PLAN:   1. Atrial fib with RVR: Currently in normal sinus rhythm and has responded to amiodarone and Cardizem drips. He has been transitioned to by mouth Cardizem 120 mg twice a day. He is also been started on metoprolol 50 mg by mouth daily. He has also recently started on Coumadin. [place] 2 hypertension and newly diagnosed diabetes. Amiodarone drip will be discontinued.Although he has negative CE, he has risk factors for CAD. Will need to plan for OP stress test. Add ASA to medication regimen.    He will need to have evaluation for sleep apnea as this can contribute to atrial fibrillation. He admits to snoring not sleeping well, and his wife has noticed that he stopped breathing several times during the night. This can  be completed as an outpatient. I have encouraged him to pursue this. He states that he did have a sleep study in the remote past but was unable to sleep and therefore the sleep study was to be rescheduled and he never did have it completed.  2. Hypertension: With diabetes, the patient would benefit from ACE inhibitor. Creatinine 1.2. It is noted from it review of past records that he has been on lisinopril HCTZ i20/12.5. n the past. We will reinstitute this at 5/12.5 mg daily as he is now  on calcium channel blocker and beta blocker, to avoid hypotension. An echocardiogram will be completed today to evaluate LV function, and atrial size.  3. Newly diagnosed diabetes: Hgb AIC  7.6 May need be placed on low-dose metformin, vs. Lifestyle changes.. We will defer to primary care.  4. Morbid obesity: Weight loss is strongly recommended with increased exercise. Dietary consult is ordered.   Signed: Bettey Mare. Lyman Bishop NP [facility] 07/19/2011, 9:11 AM Co-Sign MD I have taken a history, reviewed medications, allergies, PMH, SH, FH, and reviewed ROS and examined the patient.  I agree with the assessment and plan. Echo shows an EF of 60% with severe LVH and only mild LAE. Good BP control is of major importance. No long term amiodarone for now. OP Stress not clinically indicated and with morbid obesity images would be difficult to interpret.  Dina Mobley C. Daleen Squibb, MD, [facility] Pager:  [phone]

## 2011-07-20 DIAGNOSIS — I10 Essential (primary) hypertension: Secondary | ICD-10-CM

## 2011-07-20 DIAGNOSIS — E119 Type 2 diabetes mellitus without complications: Secondary | ICD-10-CM

## 2011-07-20 LAB — BASIC METABOLIC PANEL
BUN: 13 mg/dL (ref 6–23)
CO2: 27 mEq/L (ref 19–32)
Chloride: 99 mEq/L (ref 96–112)
GFR calc Af Amer: 75 mL/min — ABNORMAL LOW (ref >90)
Potassium: 4.1 mEq/L (ref 3.5–5.1)

## 2011-07-20 LAB — CBC
HCT: 46.7 % (ref 39.0–52.0)
Hemoglobin: 15.3 g/dL (ref 13.0–17.0)
MCHC: 32.8 g/dL (ref 30.0–36.0)
MCV: 89.1 fL (ref 78.0–100.0)
WBC: 10.3 10*3/uL (ref 4.0–10.5)

## 2011-07-20 LAB — CARDIAC PANEL(CRET KIN+CKTOT+MB+TROPI): Troponin I: <0.3 ng/mL (ref <0.30)

## 2011-07-20 LAB — PROTIME-INR: INR: 1.2 (ref 0.00–1.49)

## 2011-07-20 MED ORDER — METOPROLOL SUCCINATE ER 50 MG PO TB24: 50.0000 mg | ORAL_TABLET | Freq: Every day | ORAL | Status: DC

## 2011-07-20 MED ORDER — DILTIAZEM HCL ER COATED BEADS 120 MG PO CP24: 240.0000 mg | ORAL_CAPSULE | Freq: Two times a day (BID) | ORAL | Status: DC

## 2011-07-20 MED ORDER — WARFARIN SODIUM 10 MG PO TABS: 5.0000 mg | ORAL_TABLET | Freq: Once | ORAL | Status: DC

## 2011-07-20 MED ORDER — WARFARIN SODIUM 10 MG PO TABS: 10.0000 mg | ORAL_TABLET | Freq: Once | ORAL | Status: DC

## 2011-07-20 NOTE — Progress Notes (Signed)
Subjective: Feels much better today. No chest pain or palpitations. No shortness of breath at rest.   Objective: Blood pressure 102/63, pulse 67, temperature 98.3 F (36.8 C), temperature source Oral, resp. rate 17, height 6\' 1"  (1.854 m), weight 369 lb 4.3 oz (167.5 kg), SpO2 94.00%. Weight change: -5 lb 8.2 oz (-2.5 kg)  Telemetry - Normal sinus rhythm.  Exam -   General - Morbidly obese male in no acute distress.  Lungs - Decreased but clear to consultation.  Cardiac - Regular rate and rhythm, no gallop.  Extremities - No pitting.  Lab Results - Basic Metabolic Panel:  Lab 07/20/11 1610 07/19/11 0458 07/16/11 1350 07/16/11 1325  NA 137 -- 142 140  K 4.1 -- 3.8 --  CL 99 -- 105 102  CO2 27 -- -- 24  GLUCOSE 135* -- 152* 152*  BUN 13 -- 19 18  CREATININE 1.21 -- 1.20 1.26  CALCIUM 9.8 -- -- 10.4  MG -- 2.0 -- --  PHOS -- -- -- --   Liver Function Tests:  Lab 07/16/11 1325  AST 24  ALT 27  ALKPHOS 78  BILITOT 0.3  PROT 7.1  ALBUMIN 3.9   CBC:  Lab 07/20/11 0430 07/16/11 1350 07/16/11 1325  WBC 10.3 -- 11.6*  NEUTROABS -- -- --  HGB 15.3 16.0 14.6  HCT 46.7 47.0 44.2  MCV 89.1 -- 90.2  PLT 242 -- 246   Cardiac Enzymes:  Lab 07/20/11 0430 07/19/11 0458 07/18/11 0418 07/17/11 0513  CKTOTAL 224 198 233* 377*  CKMB 2.7 2.9 3.9 6.1*  CKMBINDEX -- -- -- --  TROPONINI <0.30 <0.30 <0.30 <0.30    ECG -  Echocardiogram 5/13: Study Conclusions  - Left ventricle: The cavity size was normal. Wall thickness was increased in a pattern of severe LVH. Systolic function was normal. The estimated ejection fraction was in the range of 55% to 60%. Wall motion was normal; there were no regional wall motion abnormalities. Doppler parameters are consistent with abnormal left ventricular relaxation (grade 1 diastolic dysfunction). - Left atrium: The atrium was mildly dilated.   Current Medications    . aspirin EC  81 mg Oral Daily  . diltiazem  120 mg  Oral BID  . enoxaparin (LOVENOX) injection  1 mg/kg Subcutaneous Q12H  . hydrochlorothiazide  12.5 mg Oral Daily  . lisinopril  5 mg Oral Daily  . metoprolol succinate  50 mg Oral Daily  . warfarin  10 mg Oral ONCE-1800  . Warfarin - Pharmacist Dosing Inpatient   Does not apply q1800  . DISCONTD: diltiazem  120 mg Oral Q12H  . DISCONTD: enoxaparin (LOVENOX) injection  1 mg/kg Subcutaneous Q12H  . DISCONTD: enoxaparin (LOVENOX) injection  170 mg Subcutaneous Q12H  . DISCONTD: hydrochlorothiazide  25 mg Oral Daily  . DISCONTD: warfarin  5 mg Oral q1800  . DISCONTD: Warfarin - Physician Dosing Inpatient   Does not apply q1800    Assessment:  1. Paroxysmal atrial fibrillation and atrial flutter with rapid ventricular response, converted to sinus rhythm. CHADS2 score is 2. LVEF is normal with severe LVH, and there is mild left atrial enlargement. Cardiac markers are normal, arguing against acute coronary syndrome.  2. Hypertension, good blood pressure control in hospital.  3. Type 2 diabetes mellitus, hemoglobin A1c 7.6.  4. Morbid obesity.  5. Noncompliance.  Plan:  Discussed situation with the patient. At this point he is on Cardizem CD 120 mg twice daily, Toprol-XL 50 mg daily, low-dose aspirin,  and Coumadin per pharmacy. He should be able to transfer out of the unit and ambulate today, possibly go home later this afternoon. No clear need to overlap his Coumadin with Lovenox. Key for him will be compliance with medications and followup. I agree that a sleep study should be ultimately considered, and this can be further discussed as an outpatient. We can follow his Coumadin in Coumadin clinic. Hold off on further ischemic testing at this particular time. Consider stopping HCTZ for now, otherwise continue ACE inhibitor.   Jonelle Sidle, M.D., F.A.C.C.

## 2011-07-20 NOTE — Progress Notes (Signed)
ANTICOAGULATION CONSULT NOTE   Pharmacy Consult for Coumadin Indication: atrial fibrillation  Allergies  Allergen Reactions  . Shrimp (Shellfish Allergy) Anaphylaxis  . Penicillins Other (See Comments)    Childhood allergy    Patient Measurements: Height: 6\' 1"  (185.4 cm) Weight: 369 lb 4.3 oz (167.5 kg) IBW/kg (Calculated) : 79.9   Vital Signs: Temp: 98.3 F (36.8 C) (05/14 0400) Temp src: Oral (05/14 0400) BP: 102/63 mmHg (05/14 0500) Pulse Rate: 67  (05/14 0500)  Labs:  Basename 07/20/11 0430 07/19/11 0458 07/18/11 0418  HGB 15.3 -- --  HCT 46.7 -- --  PLT 242 -- --  APTT -- -- --  LABPROT 15.5* 14.0 14.5  INR 1.20 1.06 1.11  HEPARINUNFRC -- -- --  CREATININE 1.21 -- --  CKTOTAL 224 198 233*  CKMB 2.7 2.9 3.9  TROPONINI <0.30 <0.30 <0.30    Estimated Creatinine Clearance: 108.1 ml/min (by C-G formula based on Cr of 1.21).   Medical History: Past Medical History  Diagnosis Date  . Hypertension   . Atrial fibrillation   . Gout     Medications:  Scheduled:     . aspirin EC  81 mg Oral Daily  . diltiazem  120 mg Oral BID  . enoxaparin (LOVENOX) injection  1 mg/kg Subcutaneous Q12H  . lisinopril  5 mg Oral Daily  . metoprolol succinate  50 mg Oral Daily  . warfarin  10 mg Oral ONCE-1800  . warfarin  10 mg Oral ONCE-1800  . Warfarin - Pharmacist Dosing Inpatient   Does not apply q1800  . DISCONTD: hydrochlorothiazide  12.5 mg Oral Daily  . DISCONTD: hydrochlorothiazide  25 mg Oral Daily  . DISCONTD: warfarin  5 mg Oral q1800  . DISCONTD: Warfarin - Physician Dosing Inpatient   Does not apply q1800    Assessment: 61 yo M admitted with new onset Afib. He was started on full dose Lovenox to bridge with warfarin 5mg . He has received 2 doses of warfarin. INR at baseline.   No bleeding noted. Renal function stable. Warfarin score=10  Goal of Therapy:  INR 2-3 Anti-Xa level 0.6-1.2 units/ml 4hrs after LMWH dose given Monitor platelets by  anticoagulation protocol: Yes   Plan:  1) Coumadin 10mg  po x 1 today. 2) Daily INR. 3) Continue Lovenox per MD until INR at goal. 4) Check CBC q3days while on Lovenox. 5) Warfarin education initiated.  Valrie Hart A 07/20/2011,8:57 AM

## 2011-07-20 NOTE — Progress Notes (Signed)
PT UP WALKING IN HALL AROUND ICU AND 2A NURSES STATION X2 W/ NO SOB OR CHEST PAIN. GAIT IS STEADY.

## 2011-07-20 NOTE — Discharge Summary (Signed)
NAME:  Kevin Goodman, KINSELLA NO.:  [id number]  MEDICAL RECORD NO.:  [id number]  LOCATION:  IC04                          FACILITY:  [facility]  PHYSICIAN:  Melvyn Novas, MDDATE OF BIRTH:  Dec 24, 1950  DATE OF ADMISSION:  07/16/2011 DATE OF DISCHARGE:  05/14/2013LH                              DISCHARGE SUMMARY   The patient is a 61 year old black male with a history of hypertension, glucose intolerance, mild hyperlipidemia, history of paroxysmal AFib, reverted to sinus rhythm last year.  He was admitted to the hospital with AFib with rapid ventricular response, had been noncompliant with his antihypertensive medicines for a period of 2 months prior to this admission.  He denies anginal chest pain.  Cardiac enzymes were negative x3.  He had his blood pressure medicines renewed and he was in good hemodynamic control.  He was again placed on Lovenox full dose and then Coumadin with subsequent therapeutic effect. The patient told me that he used to continue Coumadin daily on the basis and follow up in my office on Tuesday, 3 days' time to check his PT/INR, he understands this.  DISCHARGE MEDICATIONS: 1. Aspirin 81 mg per day. 2. Cardizem 24-hour capsule 240 p.o. daily. 3. Toprol-XL 50 mg p.o. daily. 4. Lisinopril/HCTZ 20/12.5 p.o. daily. 5. Coumadin 5 mg p.o. daily.  The patient will follow up in my office in 2 days' time.     Melvyn Novas, MD     RMD/MEDQ  D:  07/20/2011  T:  07/20/2011  Job:  [id number]

## 2011-07-20 NOTE — Progress Notes (Signed)
Hilda Lias, RN notified of follow up appointments

## 2011-07-20 NOTE — Progress Notes (Signed)
DR [name] IN  TO PREFORM DISCHARGE EXAMINATION. THIS RM GIVE PT HIS D/C INSTRUCTIONS, PRESCRTIONS AND FOLLOW UP APPOINTMENTS. PT EXPRESSED GOOD UNDERSTANDING. IV-NSL D/C'D PRE PROTOCOL FOR D/C.

## 2011-07-20 NOTE — Discharge Summary (Signed)
[id number] 

## 2011-07-20 NOTE — Discharge Instructions (Addendum)
Arrhythmia Categories Your heart is a muscle that works to pump blood through your body by regular contractions. The beating of your heart is controlled by a system of special pacemaker cells. These cells control the electrical activity of the heart. When the system controlling this regular beating is disturbed, a heart rhythm abnormality (arrhythmia) results. WHEN YOUR HEART SKIPS A BEAT One of the most common and least serious heart arrhythmias is called an ectopic or premature atrial heartbeat (PAC). This may be noticed as a small change in your regular pulse. A PAC originates from the top part (atrium) of the heart. Within the right atrium, the SA node is the area that normally controls the regularity of the heart. PACs occur in heart tissue outside of the SA node region. You may feel this as a skipped beat or heart flutter, especially if several occur in succession or occur frequently.  Another arrhythmia is ventricular premature complex (VCP or PVC). These extra beats start out in the bottom, more muscular chambers of the heart. In most cases a PVC is harmless. If there are underlying causes that are making the heart irritable such as an overactive thyroid or a prior heart attack PVCs may be of more concern. In a few cases, medications to control the heart rhythm may be prescribed. Things to try at home:  Cut down or avoid alcohol, tobacco and caffeine.   Get enough sleep.   Reduce stress.   Exercise more.  WHEN THE HEART BEATS TOO FAST Atrial tachycardia is a fast heart rate, which starts out in the atrium. It may last from minutes to much longer. Your heart may beat 140 to 240 times per minute instead of the normal 60 to 100.  Symptoms include a worried feeling (anxiety) and a sense that your heart is beating fast and hard.   You may be able to stop the fast rate by holding your breath or bearing down as if you were going to have a bowel movement.   This type of fast rate is usually not  dangerous.  Atrial fibrillation and atrial flutter are other fast rhythms that start in the atria. Both conditions keep the atria from filling with enough blood so the heart does not work well.  Symptoms include feeling lightheaded or faint.   These fast rates may be the result of heart damage or disease. Too much thyroid hormone may play a role.   There may be no clear cause or it may be from heart disease or damage.   Medication or a special electrical treatment (cardioversion) may be needed to get the heart beating normally.  Ventricular tachycardia is a fast heart rate that starts in the lower muscular chambers (ventricles) This is a serious disorder that requires treatment as soon as possible. You need someone else to get and use a small defibrillator.  Symptoms include collapse, chest pain, or being short of breath.   Treatment may include medication, procedures to improve blood flow to the heart, or an implantable cardiac defibrillator (ICD).  DIAGNOSIS   A cardiogram (EKG or ECG) will be done to see the arrhythmia, as well as lab tests to check the underlying cause.   If the extra beats or fast rate come and go, you may wear a Holter monitor that records your heart rate for a longer period of time.  HOME CARE INSTRUCTIONS  If your caregiver has given you a follow-up appointment, it is very important to keep that appointment. Not keeping the   appointment could result in a heart attack, permanent injury, pain, and disability. If there is any problem keeping the appointment, you must call back to this facility for assistance.  SEEK MEDICAL CARE IF:  You have irregular or fast heartbeats (palpitations).   You experience skipped beats.   You develop lightheadedness.   You have chest discomfort.   You develop shortness of breath.   You have more frequent episodes, if you are already being treated.  SEEK IMMEDIATE MEDICAL CARE IF:   You have severe chest pain, especially if the  pain is crushing or pressure-like and spreads to the arms, back, neck, or jaw, or if you have sweating, feeling sick to your stomach (nausea), or shortness of breath. THIS IS AN EMERGENCY. Do not wait to see if the pain will go away. Get medical help at once. Call 911 or 0 (operator). DO NOT drive yourself to the hospital.   You feel dizzy or faint.   You have episodes of previously documented atrial tachycardia that do not resolve with the techniques your caregiver has taught you.   Irregular or rapid heartbeats begin to occur more often than in the past, especially if they are associated with more pronounced symptoms or of longer duration.  Document Released: 02/22/2005 Document Revised: 02/11/2011 Document Reviewed: 07/07/2006 [facility] Patient Information 2012 [place], [place].Atrial Fibrillation Atrial fibrillation is an abnormal heartbeat (rhythm). It can cause your heart rate to be faster or slower than normal, and can cause clots of blood to form in your heart. These clots can cause other health problems. Atrial fibrillation may be caused by a heart attack, lung problem, or certain medicine. Sometimes the cause of atrial fibrillation is not found. HOME CARE  Take blood thinning medicine (anticoagulants) as told by your doctor. Your doctor will need to draw your blood to check lab values if you take blood thinners.   If you had a cardioversion, limit your activity as told by your doctor.   Learn how to check your heartbeat (pulse) for an abnormal or irregular beat. Your doctor can show you how.   Ask your doctor if it is okay to exercise.   Only take medicine as told by your doctor.  GET HELP RIGHT AWAY IF:   You have trouble breathing or feel dizzy.   You have puffy (swollen) feet or ankles.   You have blood in your pee (urine) or poop (bowel movement).   You feel your heart "skipping" beats.   You feel your heart "racing" or beating fast.   You have weakness in your arms or  legs.   You have trouble talking, seeing, or thinking.   You have chest pain or pain in your arm or jaw.  MAKE SURE YOU:   Understand these instructions.   Will watch your condition.   Will get help right away if you are not doing well or get worse.  Document Released: 12/02/2007 Document Revised: 02/11/2011 Document Reviewed: 06/12/2009 [facility] Patient Information 2012 [place], [place].

## 2011-07-27 ENCOUNTER — Encounter: Payer: Self-pay | Admitting: Adult Health

## 2011-07-29 ENCOUNTER — Encounter: Payer: BC Managed Care – PPO | Admitting: Adult Health

## 2011-07-29 ENCOUNTER — Encounter: Payer: Self-pay | Admitting: Adult Health

## 2011-07-29 ENCOUNTER — Ambulatory Visit (INDEPENDENT_AMBULATORY_CARE_PROVIDER_SITE_OTHER): Payer: BC Managed Care – PPO | Admitting: Adult Health

## 2011-07-29 ENCOUNTER — Ambulatory Visit (INDEPENDENT_AMBULATORY_CARE_PROVIDER_SITE_OTHER): Payer: BC Managed Care – PPO | Admitting: *Deleted

## 2011-07-29 VITALS — BP 122/81 | HR 69 | Resp 18 | Ht 73.0 in | Wt 361.0 lb

## 2011-07-29 DIAGNOSIS — IMO0002 Reserved for concepts with insufficient information to code with codable children: Secondary | ICD-10-CM

## 2011-07-29 DIAGNOSIS — I10 Essential (primary) hypertension: Secondary | ICD-10-CM

## 2011-07-29 DIAGNOSIS — Z7901 Long term (current) use of anticoagulants: Secondary | ICD-10-CM | POA: Insufficient documentation

## 2011-07-29 NOTE — Progress Notes (Signed)
   HPI: Kevin Goodman is a pleasant 60 y/o patient of Dr. Daleen Squibb we are seeing post hospitalization for new onset atrial fib, currently on coumadin tx, with history of hypertension and morbid obesity. He is without complaint, has lost 12 lbs and is medically compliant. He denies any bleeding issues or heart racing/palpitations. He is walking 3 miles a day. He is calling his hospitalization "a wake-up call." He is committed to being more health conscious.   Allergies  Allergen Reactions  . Shrimp (Shellfish Allergy) Anaphylaxis  . Penicillins Other (See Comments)    Childhood allergy    Current Outpatient Prescriptions  Medication Sig Dispense Refill  . CINNAMON PO Take by mouth daily.      Marland Kitchen diltiazem (CARDIZEM CD) 120 MG 24 hr capsule Take 2 capsules (240 mg total) by mouth 2 (two) times daily.  30 capsule  3  . lisinopril-hydrochlorothiazide (PRINZIDE,ZESTORETIC) 20-12.5 MG per tablet Take 1 tablet by mouth daily.      . metoprolol succinate (TOPROL-XL) 50 MG 24 hr tablet Take 1 tablet (50 mg total) by mouth daily. Take with or immediately following a meal.  30 tablet  3  . warfarin (COUMADIN) 10 MG tablet Take 0.5 tablets (5 mg total) by mouth one time only at 6 PM.  30 tablet  2    Past Medical History  Diagnosis Date  . Hypertension   . Atrial fibrillation   . Gout     Past Surgical History  Procedure Date  . Finger surgery     left ring    WJX:BJYNWG of systems complete and found to be negative unless listed above PHYSICAL EXAM BP 122/81  Pulse 69  Resp 18  Ht 6\' 1"  (1.854 m)  Wt 361 lb (163.749 kg)  BMI 47.63 kg/m2  General: Well developed, well nourished, in no acute distress Head: Eyes PERRLA, No xanthomas.   Normal cephalic and atraumatic Lungs: Clear bilaterally to auscultation and percussion. Heart: HRRR S1 S2, without MRG.  Pulses are 2+ & equal.            No carotid bruit. No JVD.  No abdominal bruits. No femoral bruits. Abdomen: Bowel sounds are positive,  abdomen soft and non-tender without masses or                  Hernia's noted. Msk:  Back normal, normal gait. Normal strength and tone for age. Extremities: No clubbing, cyanosis or edema.  DP +1 Neuro: Alert and oriented X 3. Psych:  Good affect, responds appropriately    ASSESSMENT AND PLAN

## 2011-07-29 NOTE — Assessment & Plan Note (Signed)
Excellent control of BP. Will watch this closely as he is trying to lose weight. With significant wt loss, he will need medication adjustments to avoid hypotension. He is encouraged to continue exercise and wt loss regimen.

## 2011-07-29 NOTE — Assessment & Plan Note (Signed)
Remains in NSR. He continues on cardizem and coumadin. He has no recurrence of symptoms. He will continue current medication regimen. We will see him in 6 months. I have encouraged him on his new healthy lifestyle choices.

## 2011-07-29 NOTE — Patient Instructions (Signed)
**Note De-identified Sharay Bellissimo Obfuscation** Your physician recommends that you continue on your current medications as directed. Please refer to the Current Medication list given to you today.  Your physician recommends that you schedule a follow-up appointment in: 6 months  

## 2011-07-29 NOTE — Patient Instructions (Signed)
Pt will let me know if he changes INR management to Dr Janna Arch.

## 2011-08-04 ENCOUNTER — Ambulatory Visit: Payer: Self-pay | Admitting: *Deleted

## 2011-08-04 DIAGNOSIS — IMO0002 Reserved for concepts with insufficient information to code with codable children: Secondary | ICD-10-CM

## 2011-08-04 DIAGNOSIS — Z7901 Long term (current) use of anticoagulants: Secondary | ICD-10-CM

## 2014-04-08 DEATH — deceased

## 2016-04-19 ENCOUNTER — Emergency Department (HOSPITAL_COMMUNITY): Payer: BC Managed Care – PPO

## 2016-04-19 ENCOUNTER — Inpatient Hospital Stay (HOSPITAL_COMMUNITY)
Admission: EM | Admit: 2016-04-19 | Discharge: 2016-04-25 | DRG: 853 | Disposition: A | Discharge status: Home Health | Payer: BC Managed Care – PPO | Attending: Internal Medicine | Admitting: Internal Medicine

## 2016-04-19 ENCOUNTER — Encounter (HOSPITAL_COMMUNITY): Payer: Self-pay | Admitting: Emergency Medicine

## 2016-04-19 ENCOUNTER — Inpatient Hospital Stay (HOSPITAL_COMMUNITY): Payer: BC Managed Care – PPO

## 2016-04-19 DIAGNOSIS — A419 Sepsis, unspecified organism: Secondary | ICD-10-CM | POA: Diagnosis present

## 2016-04-19 DIAGNOSIS — Z880 Allergy status to penicillin: Secondary | ICD-10-CM | POA: Diagnosis not present

## 2016-04-19 DIAGNOSIS — N179 Acute kidney failure, unspecified: Secondary | ICD-10-CM | POA: Diagnosis present

## 2016-04-19 DIAGNOSIS — B9689 Other specified bacterial agents as the cause of diseases classified elsewhere: Secondary | ICD-10-CM | POA: Diagnosis present

## 2016-04-19 DIAGNOSIS — R05 Cough: Secondary | ICD-10-CM | POA: Diagnosis present

## 2016-04-19 DIAGNOSIS — E860 Dehydration: Secondary | ICD-10-CM | POA: Diagnosis present

## 2016-04-19 DIAGNOSIS — L0291 Cutaneous abscess, unspecified: Secondary | ICD-10-CM | POA: Diagnosis not present

## 2016-04-19 DIAGNOSIS — M109 Gout, unspecified: Secondary | ICD-10-CM | POA: Diagnosis present

## 2016-04-19 DIAGNOSIS — Z23 Encounter for immunization: Secondary | ICD-10-CM | POA: Diagnosis not present

## 2016-04-19 DIAGNOSIS — R000 Tachycardia, unspecified: Secondary | ICD-10-CM | POA: Diagnosis present

## 2016-04-19 DIAGNOSIS — L03818 Cellulitis of other sites: Secondary | ICD-10-CM

## 2016-04-19 DIAGNOSIS — Z6841 Body Mass Index (BMI) 40.0 and over, adult: Secondary | ICD-10-CM | POA: Diagnosis not present

## 2016-04-19 DIAGNOSIS — I480 Paroxysmal atrial fibrillation: Secondary | ICD-10-CM | POA: Diagnosis not present

## 2016-04-19 DIAGNOSIS — L02416 Cutaneous abscess of left lower limb: Secondary | ICD-10-CM | POA: Diagnosis present

## 2016-04-19 DIAGNOSIS — F17290 Nicotine dependence, other tobacco product, uncomplicated: Secondary | ICD-10-CM | POA: Diagnosis present

## 2016-04-19 DIAGNOSIS — I10 Essential (primary) hypertension: Secondary | ICD-10-CM | POA: Diagnosis present

## 2016-04-19 DIAGNOSIS — E1151 Type 2 diabetes mellitus with diabetic peripheral angiopathy without gangrene: Secondary | ICD-10-CM | POA: Diagnosis present

## 2016-04-19 DIAGNOSIS — M726 Necrotizing fasciitis: Secondary | ICD-10-CM | POA: Diagnosis not present

## 2016-04-19 DIAGNOSIS — E46 Unspecified protein-calorie malnutrition: Secondary | ICD-10-CM | POA: Diagnosis present

## 2016-04-19 DIAGNOSIS — R059 Cough, unspecified: Secondary | ICD-10-CM

## 2016-04-19 DIAGNOSIS — E1165 Type 2 diabetes mellitus with hyperglycemia: Secondary | ICD-10-CM | POA: Diagnosis not present

## 2016-04-19 DIAGNOSIS — J209 Acute bronchitis, unspecified: Secondary | ICD-10-CM | POA: Diagnosis not present

## 2016-04-19 DIAGNOSIS — E119 Type 2 diabetes mellitus without complications: Secondary | ICD-10-CM

## 2016-04-19 DIAGNOSIS — I4891 Unspecified atrial fibrillation: Secondary | ICD-10-CM | POA: Diagnosis present

## 2016-04-19 HISTORY — DX: Type 2 diabetes mellitus without complications: E11.9

## 2016-04-19 LAB — COMPREHENSIVE METABOLIC PANEL
ALBUMIN: 2.9 g/dL — AB (ref 3.5–5.0)
ALT: 25 U/L (ref 17–63)
ANION GAP: 15 (ref 5–15)
AST: 28 U/L (ref 15–41)
Alkaline Phosphatase: 60 U/L (ref 38–126)
BILIRUBIN TOTAL: 0.8 mg/dL (ref 0.3–1.2)
BUN: 28 mg/dL — ABNORMAL HIGH (ref 6–20)
CALCIUM: 8.6 mg/dL — AB (ref 8.9–10.3)
CO2: 19 mmol/L — ABNORMAL LOW (ref 22–32)
Chloride: 92 mmol/L — ABNORMAL LOW (ref 101–111)
Creatinine, Ser: 2.06 mg/dL — ABNORMAL HIGH (ref 0.61–1.24)
GFR calc non Af Amer: 33 mL/min — ABNORMAL LOW (ref >60)
GFR, EST AFRICAN AMERICAN: 38 mL/min — AB (ref >60)
GLUCOSE: 673 mg/dL — AB (ref 65–99)
Potassium: 3.7 mmol/L (ref 3.5–5.1)
Sodium: 126 mmol/L — ABNORMAL LOW (ref 135–145)
TOTAL PROTEIN: 7.3 g/dL (ref 6.5–8.1)

## 2016-04-19 LAB — GLUCOSE, CAPILLARY: Glucose-Capillary: 332 mg/dL — ABNORMAL HIGH (ref 65–99)

## 2016-04-19 LAB — CBC WITH DIFFERENTIAL/PLATELET
BASOS ABS: 0 10*3/uL (ref 0.0–0.1)
Basophils Relative: 0 %
EOS ABS: 0 10*3/uL (ref 0.0–0.7)
Eosinophils Relative: 0 %
HEMATOCRIT: 40.5 % (ref 39.0–52.0)
Hemoglobin: 13.9 g/dL (ref 13.0–17.0)
LYMPHS ABS: 3.5 10*3/uL (ref 0.7–4.0)
Lymphocytes Relative: 17 %
MCH: 31.1 pg (ref 26.0–34.0)
MCHC: 34.3 g/dL (ref 30.0–36.0)
MCV: 90.6 fL (ref 78.0–100.0)
MONOS PCT: 8 %
Monocytes Absolute: 1.6 10*3/uL — ABNORMAL HIGH (ref 0.1–1.0)
NEUTROS ABS: 15.2 10*3/uL — AB (ref 1.7–7.7)
Neutrophils Relative %: 75 %
Platelets: 280 10*3/uL (ref 150–400)
RBC: 4.47 MIL/uL (ref 4.22–5.81)
RDW: 14.1 % (ref 11.5–15.5)
WBC: 20.3 10*3/uL — ABNORMAL HIGH (ref 4.0–10.5)

## 2016-04-19 LAB — CBG MONITORING, ED: GLUCOSE-CAPILLARY: 390 mg/dL — AB (ref 65–99)

## 2016-04-19 LAB — PROCALCITONIN: Procalcitonin: 2.83 ng/mL

## 2016-04-19 LAB — LACTIC ACID, PLASMA
LACTIC ACID, VENOUS: 3 mmol/L — AB (ref 0.5–1.9)
LACTIC ACID, VENOUS: 2.3 mmol/L — AB (ref 0.5–1.9)
Lactic Acid, Venous: 2.2 mmol/L (ref 0.5–1.9)

## 2016-04-19 LAB — PROTIME-INR
INR: 1.21
Prothrombin Time: 15.4 seconds — ABNORMAL HIGH (ref 11.4–15.2)

## 2016-04-19 LAB — APTT: aPTT: 36 seconds (ref 24–36)

## 2016-04-19 MED ORDER — VANCOMYCIN HCL 10 G IV SOLR
1750.0000 mg | INTRAVENOUS | Status: DC
  Administered 2016-04-20 – 2016-04-23 (×4): 1750 mg via INTRAVENOUS
  Filled 2016-04-19 (×5): qty 1750

## 2016-04-19 MED ORDER — ONDANSETRON HCL 4 MG/2ML IJ SOLN: 4.0000 mg | Freq: Four times a day (QID) | INTRAMUSCULAR | Status: DC | PRN

## 2016-04-19 MED ORDER — ALLOPURINOL 300 MG PO TABS
300.0000 mg | ORAL_TABLET | Freq: Every day | ORAL | Status: DC
  Administered 2016-04-20 – 2016-04-25 (×6): 300 mg via ORAL
  Filled 2016-04-19 (×6): qty 1

## 2016-04-19 MED ORDER — INFLUENZA VAC SPLIT QUAD 0.5 ML IM SUSY
0.5000 mL | PREFILLED_SYRINGE | INTRAMUSCULAR | Status: AC
  Administered 2016-04-20: 0.5 mL via INTRAMUSCULAR
  Filled 2016-04-19: qty 0.5

## 2016-04-19 MED ORDER — METOPROLOL SUCCINATE ER 50 MG PO TB24
50.0000 mg | ORAL_TABLET | Freq: Every day | ORAL | Status: DC
  Administered 2016-04-20 – 2016-04-25 (×6): 50 mg via ORAL
  Filled 2016-04-19 (×6): qty 1

## 2016-04-19 MED ORDER — MORPHINE SULFATE (PF) 4 MG/ML IV SOLN
INTRAVENOUS | Status: AC
  Filled 2016-04-19: qty 1

## 2016-04-19 MED ORDER — SODIUM CHLORIDE 0.9 % IV SOLN: INTRAVENOUS | Status: DC

## 2016-04-19 MED ORDER — ACETAMINOPHEN 325 MG PO TABS: 650.0000 mg | ORAL_TABLET | Freq: Four times a day (QID) | ORAL | Status: DC | PRN

## 2016-04-19 MED ORDER — SODIUM CHLORIDE 0.9 % IV BOLUS (SEPSIS)
500.0000 mL | Freq: Once | INTRAVENOUS | Status: AC
  Administered 2016-04-19: 500 mL via INTRAVENOUS

## 2016-04-19 MED ORDER — INSULIN ASPART 100 UNIT/ML ~~LOC~~ SOLN
0.0000 [IU] | Freq: Three times a day (TID) | SUBCUTANEOUS | Status: DC
  Administered 2016-04-20: 7 [IU] via SUBCUTANEOUS
  Administered 2016-04-20 (×2): 11 [IU] via SUBCUTANEOUS
  Administered 2016-04-21: 7 [IU] via SUBCUTANEOUS
  Administered 2016-04-21 (×2): 11 [IU] via SUBCUTANEOUS
  Administered 2016-04-22: 4 [IU] via SUBCUTANEOUS
  Administered 2016-04-22: 11 [IU] via SUBCUTANEOUS
  Administered 2016-04-22 – 2016-04-23 (×2): 7 [IU] via SUBCUTANEOUS
  Administered 2016-04-23 (×2): 4 [IU] via SUBCUTANEOUS
  Administered 2016-04-24: 3 [IU] via SUBCUTANEOUS
  Administered 2016-04-24: 4 [IU] via SUBCUTANEOUS
  Administered 2016-04-24: 3 [IU] via SUBCUTANEOUS
  Administered 2016-04-25: 2 [IU] via SUBCUTANEOUS

## 2016-04-19 MED ORDER — CLINDAMYCIN PHOSPHATE 900 MG/50ML IV SOLN
900.0000 mg | Freq: Three times a day (TID) | INTRAVENOUS | Status: DC
  Administered 2016-04-19 – 2016-04-21 (×5): 900 mg via INTRAVENOUS
  Filled 2016-04-19 (×7): qty 50

## 2016-04-19 MED ORDER — VANCOMYCIN HCL 10 G IV SOLR
2000.0000 mg | Freq: Once | INTRAVENOUS | Status: AC
  Administered 2016-04-19: 2000 mg via INTRAVENOUS
  Filled 2016-04-19: qty 2000

## 2016-04-19 MED ORDER — VANCOMYCIN HCL IN DEXTROSE 1-5 GM/200ML-% IV SOLN
1000.0000 mg | Freq: Once | INTRAVENOUS | Status: DC
  Filled 2016-04-19: qty 200

## 2016-04-19 MED ORDER — IOPAMIDOL (ISOVUE-300) INJECTION 61%
80.0000 mL | Freq: Once | INTRAVENOUS | Status: AC | PRN
  Administered 2016-04-19: 75 mL via INTRAVENOUS

## 2016-04-19 MED ORDER — INSULIN ASPART 100 UNIT/ML ~~LOC~~ SOLN
0.0000 [IU] | Freq: Every day | SUBCUTANEOUS | Status: DC
  Administered 2016-04-19: 4 [IU] via SUBCUTANEOUS

## 2016-04-19 MED ORDER — ACETAMINOPHEN 650 MG RE SUPP: 650.0000 mg | Freq: Four times a day (QID) | RECTAL | Status: DC | PRN

## 2016-04-19 MED ORDER — INSULIN GLARGINE 100 UNIT/ML ~~LOC~~ SOLN
10.0000 [IU] | Freq: Every day | SUBCUTANEOUS | Status: DC
  Administered 2016-04-19: 10 [IU] via SUBCUTANEOUS
  Filled 2016-04-19: qty 0.1

## 2016-04-19 MED ORDER — ALBUTEROL SULFATE (2.5 MG/3ML) 0.083% IN NEBU: 2.5000 mg | INHALATION_SOLUTION | RESPIRATORY_TRACT | Status: DC | PRN

## 2016-04-19 MED ORDER — DEXTROSE 5 % IV SOLN
1.0000 g | Freq: Three times a day (TID) | INTRAVENOUS | Status: DC
  Administered 2016-04-19 – 2016-04-23 (×11): 1 g via INTRAVENOUS
  Filled 2016-04-19 (×17): qty 1

## 2016-04-19 MED ORDER — MORPHINE SULFATE (PF) 4 MG/ML IV SOLN
4.0000 mg | Freq: Once | INTRAVENOUS | Status: AC
  Administered 2016-04-19: 4 mg via INTRAVENOUS

## 2016-04-19 MED ORDER — LACTATED RINGERS IV SOLN
INTRAVENOUS | Status: DC
  Administered 2016-04-19: 21:00:00 via INTRAVENOUS

## 2016-04-19 MED ORDER — ONDANSETRON HCL 4 MG PO TABS: 4.0000 mg | ORAL_TABLET | Freq: Four times a day (QID) | ORAL | Status: DC | PRN

## 2016-04-19 MED ORDER — SODIUM CHLORIDE 0.9 % IV BOLUS (SEPSIS)
1000.0000 mL | Freq: Once | INTRAVENOUS | Status: AC
  Administered 2016-04-19: 1000 mL via INTRAVENOUS

## 2016-04-19 MED ORDER — DEXTROSE 5 % IV SOLN
2.0000 g | Freq: Once | INTRAVENOUS | Status: AC
  Administered 2016-04-19: 2 g via INTRAVENOUS
  Filled 2016-04-19: qty 2

## 2016-04-19 MED ORDER — INSULIN ASPART 100 UNIT/ML ~~LOC~~ SOLN
8.0000 [IU] | Freq: Once | SUBCUTANEOUS | Status: AC
  Administered 2016-04-19: 8 [IU] via SUBCUTANEOUS
  Filled 2016-04-19: qty 1

## 2016-04-19 MED ORDER — PNEUMOCOCCAL VAC POLYVALENT 25 MCG/0.5ML IJ INJ
0.5000 mL | INJECTION | INTRAMUSCULAR | Status: AC
  Administered 2016-04-20: 0.5 mL via INTRAMUSCULAR
  Filled 2016-04-19 (×2): qty 0.5

## 2016-04-19 MED ORDER — MORPHINE SULFATE (PF) 4 MG/ML IV SOLN
4.0000 mg | INTRAVENOUS | Status: DC | PRN
  Administered 2016-04-21 – 2016-04-22 (×5): 4 mg via INTRAVENOUS
  Filled 2016-04-19 (×5): qty 1

## 2016-04-19 MED ORDER — METRONIDAZOLE IN NACL 5-0.79 MG/ML-% IV SOLN
500.0000 mg | Freq: Three times a day (TID) | INTRAVENOUS | Status: DC
  Administered 2016-04-19: 500 mg via INTRAVENOUS
  Filled 2016-04-19: qty 100

## 2016-04-19 NOTE — ED Notes (Signed)
CRITICAL VALUE ALERT  Critical value received:  Glucose 673  Date of notification:  04/19/16  Time of notification:  [id number]  Critical value read back:Yes.    Nurse who received alert:  RMinter, RN  MD notified (1st page):  Dr. Reather Converse  Time of first page:  1311  MD notified (2nd page):  Time of second page:  Responding MD:  Dr. Laverta Baltimore  Time MD responded:  1311

## 2016-04-19 NOTE — Progress Notes (Signed)
Pharmacy Antibiotic Note  Kevin Goodman is a 66 y.o. male admitted on 04/19/2016 with cellulitis.  Pharmacy has been consulted for Vancomycin, Azactam, and Flagyl dosing.  Plan: Vancomycin 2000mg  loading dose then 1750mg    IV every 24 hours.  Goal trough 10-15 mcg/mL. Azactam 2gm IV loading dose in ED, then 1gm IV q8h Metronidazole 500mg  IV q8h F/U cxs and clinical progress Monitor V/S and labs and levels as indicated  Height: 6' 0.83" (185 cm) Weight: (!) 361 lb (163.7 kg) IBW/kg (Calculated) : 79.52  Temp (24hrs), Avg:98.3 F (36.8 C), Min:98.3 F (36.8 C), Max:98.3 F (36.8 C)   Recent Labs Lab 04/19/16 1153 04/19/16 1248 04/19/16 1549  WBC 20.3*  --   --   CREATININE 2.06*  --   --   LATICACIDVEN  --  3.0* 2.3*    Normalized CrCl 37mls/min  Estimated Creatinine Clearance: 58.8 mL/min (by C-G formula based on SCr of 2.06 mg/dL (H)).    Allergies  Allergen Reactions  . Shrimp [Shellfish Allergy] Anaphylaxis  . Penicillins Other (See Comments)    Childhood allergy    Antimicrobials this admission: Vancomycin 2/12 >>  Aztreonam 2/12 >>  Metronidazole 2/12>>  Dose adjustments this admission: N/A  Microbiology results: 2/12 BCx: pending  Thank you for allowing pharmacy to be a part of this patient's care.  Isac Sarna, BS Pharm D, [place] Clinical Pharmacist Pager [phone] 04/19/2016 5:02 PM

## 2016-04-19 NOTE — ED Notes (Signed)
CRITICAL VALUE ALERT  Critical value received:  Lactic acid 3.0  Date of notification:  04/19/16  Time of notification:    Critical value read back:Yes.    Nurse who received alert:  RMinter, RN  MD notified (1st page):  Dr. Laverta Baltimore  Time of first page:  73  MD notified (2nd page):  Time of second page:  Responding MD:  Dr. Laverta Baltimore  Time MD responded:  1400

## 2016-04-19 NOTE — ED Notes (Signed)
Called [facility] with bed assignment.   They will send [place] a truck.

## 2016-04-19 NOTE — ED Notes (Signed)
PT left with [facility] at this time.  

## 2016-04-19 NOTE — ED Provider Notes (Signed)
[facility] DEPT Provider Note   CSN: [id number] Arrival date & time: 04/19/16  1123   By signing my name below, I, Kevin Goodman, attest that this documentation has been prepared under the direction and in the presence of Margette Fast, MD. Electronically Signed: Hilbert Goodman, Scribe. 04/19/16. 12:28 PM. History   Chief Complaint Chief Complaint  Patient presents with  . Abscess     The history is provided by the patient. No language interpreter was used.    HPI Comments: Kevin Goodman is a 66 y.o. male who presents to the Emergency Department complaining of left groin abscess that began around 2 weeks ago. The patient states that it began in his scrotum and spread to his leg. He reports that the abscess is continuing to grow. He also reports some drainage. He has been having a subjective fever, chills, and diaphoresis for the past 3 days. He denies any abdominal pain. He hasn't been to see a doctor for this problem. He has a hx of diabetes which he is on metformin but restarted only 1 week prior. Did not mention abscess during that visit. The pain in his groin is throbbing and non-radiating. Made worse with movement. No alleviating factors.  Past Medical History:  Diagnosis Date  . Atrial fibrillation ([place])   . Diabetes mellitus without complication ([place])   . Gout   . Hypertension     Patient Active Problem List   Diagnosis Date Noted  . Abscess 04/19/2016  . Long term (current) use of anticoagulants 07/29/2011  . Type 2 diabetes mellitus ([place]) 07/20/2011  . Atrial fibrillation or flutter 07/19/2011  . Hypertension 07/19/2011  . Morbid obesity ([place]) 07/19/2011    Past Surgical History:  Procedure Laterality Date  . FINGER SURGERY     left ring       Home Medications    Prior to Admission medications   Medication Sig Start Date End Date Taking? Authorizing Provider  allopurinol (ZYLOPRIM) 300 MG tablet Take 300 mg by mouth daily.   Yes Historical  Provider, MD  clobetasol cream (TEMOVATE) [id number] % Apply 1 application topically 2 (two) times daily as needed.   Yes Historical Provider, MD  metFORMIN (GLUCOPHAGE) 500 MG tablet Take 500 mg by mouth 2 (two) times daily with a meal.   Yes Historical Provider, MD  metoprolol succinate (TOPROL-XL) 50 MG 24 hr tablet Take 50 mg by mouth daily. Take with or immediately following a meal.   Yes Historical Provider, MD    Family History Family History  Problem Relation Age of Onset  . Heart failure Mother   . Hypertension Mother   . Hypertension Father   . Stroke Father   . Stroke Brother     Social History Social History  Substance Use Topics  . Smoking status: Current Some Day Smoker    Types: Cigars  . Smokeless tobacco: Never Used  . Alcohol use No     Allergies   Shrimp [shellfish allergy] and Penicillins   Review of Systems Review of Systems  Constitutional: Positive for chills, diaphoresis and fever.  Gastrointestinal: Negative for abdominal pain.  Genitourinary:       Left groin pain.  All other systems reviewed and are negative.  10-point ROS reviewed and otherwise normal.    Physical Exam Updated Vital Signs BP 107/63   Pulse 102   Temp 98.3 F (36.8 C) (Oral)   Resp 23   Ht 6' 0.83" (1.85 m)   Wt (!) 361  lb (163.7 kg)   SpO2 97%   BMI 47.84 kg/m   Physical Exam  Constitutional: He is oriented to person, place, and time. He appears well-developed and well-nourished.  HENT:  Head: Normocephalic and atraumatic.  Cardiovascular: Normal heart sounds and intact distal pulses.  Exam reveals no gallop and no friction rub.   No murmur heard. Sinus tachycardia.   Pulmonary/Chest: Effort normal and breath sounds normal. No stridor. No respiratory distress. He has no wheezes. He has no rales.  Abdominal: Soft. He exhibits no distension. There is no tenderness. There is no guarding.  Genitourinary: Penis normal.  Genitourinary Comments: 20X15 cm area of  fluctuance and surrounding erythema with induration extending into the perineum.   Musculoskeletal: Normal range of motion. He exhibits no edema or deformity.  Neurological: He is alert and oriented to person, place, and time.  Skin: Skin is warm and dry.  Psychiatric: He has a normal mood and affect.  Nursing note and vitals reviewed.      ED Treatments / Results  DIAGNOSTIC STUDIES: Oxygen Saturation is 99% on RA, normal by my interpretation.    COORDINATION OF CARE: 12:19 PM Discussed treatment plan with pt at bedside and pt agreed to plan. I will check the patient's lactic acid levels and CT scan.  Labs (all labs ordered are listed, but only abnormal results are displayed) Labs Reviewed  CBC WITH DIFFERENTIAL/PLATELET - Abnormal; Notable for the following:       Result Value   WBC 20.3 (*)    Neutro Abs 15.2 (*)    Monocytes Absolute 1.6 (*)    All other components within normal limits  COMPREHENSIVE METABOLIC PANEL - Abnormal; Notable for the following:    Sodium 126 (*)    Chloride 92 (*)    CO2 19 (*)    Glucose, Bld 673 (*)    BUN 28 (*)    Creatinine, Ser 2.06 (*)    Calcium 8.6 (*)    Albumin 2.9 (*)    GFR calc non Af Amer 33 (*)    GFR calc Af Amer 38 (*)    All other components within normal limits  PROTIME-INR - Abnormal; Notable for the following:    Prothrombin Time 15.4 (*)    All other components within normal limits  LACTIC ACID, PLASMA - Abnormal; Notable for the following:    Lactic Acid, Venous 3.0 (*)    All other components within normal limits  LACTIC ACID, PLASMA - Abnormal; Notable for the following:    Lactic Acid, Venous 2.3 (*)    All other components within normal limits  CBG MONITORING, ED - Abnormal; Notable for the following:    Glucose-Capillary >600 (*)    All other components within normal limits  CBG MONITORING, ED - Abnormal; Notable for the following:    Glucose-Capillary 390 (*)    All other components within normal limits    CULTURE, BLOOD (ROUTINE X 2)  CULTURE, BLOOD (ROUTINE X 2)    EKG  EKG Interpretation  Date/Time:  Monday April 19 2016 12:41:48 EST Ventricular Rate:  100 PR Interval:    QRS Duration: 103 QT Interval:  334 QTC Calculation: 431 R Axis:   140 Text Interpretation:  Sinus tachycardia Right axis deviation No STEMI.  Confirmed by LONG MD, JOSHUA [id number]) on 04/19/2016 12:43:58 PM       Radiology Ct Pelvis W Contrast  Result Date: 04/19/2016 CLINICAL DATA:  66 year old male with left groin infection and abscess  beginning 2 weeks ago. Diabetic. Initial encounter. EXAM: CT PELVIS WITH CONTRAST TECHNIQUE: Multidetector CT imaging of the pelvis was performed using the standard protocol following the bolus administration of intravenous contrast. CONTRAST:  79mL ISOVUE-300 IOPAMIDOL (ISOVUE-300) INJECTION 61% COMPARISON:  None. FINDINGS: Urinary Tract: Distal ureters and urinary bladder appear unremarkable. Kidneys not included. Bowel: Negative visible large bowel, it including a partially visible normal retrocecal appendix. Negative visible small bowel. Vascular/Lymphatic: Suboptimal intravascular contrast in part related to decreased contrast dose in the setting of renal insufficiency. Mild femoral artery calcified peripheral vascular disease. No right inguinal or right iliac lymphadenopathy. See left side node details below. Reproductive: The scrotum itself appears to be spared except for superficial skin thickening. There is no scrotal gas. However, there is a small area of scrotal fat stranding on the right side (coronal image 36). Other: Left inguinal subcutaneous gas and confluent fat stranding beginning just below the left inguinal ligament and continuing distally into the anterior and medial thigh. Clustered gas near the skin surface on series 2, image 69. Reactive appearing left inguinal and left iliac lymph nodes, measuring individually up to 17 mm short axis. No cystic or necrotic nodes. No  drainable fluid collection. Musculoskeletal: Motion artifact at both hip joints. Moderate to severe chronic degenerative changes in the visible lumbar spine. No acute osseous abnormality identified. IMPRESSION: 1. Soft tissue infection suspicious for Necrotizing Fasciitis or infection by a gas-forming organism. Inflammation with abundant subcutaneous gas extending from the left groin at the level of the inguinal ligament tracking into the anterior and medial left thigh. No associated abscess or fluid collection. 2. The scrotum appears largely spared except for skin thickening and a small area of right superior scrotal subcutaneous inflammation (series 4, image 36). 3. Reactive left inguinal and iliac station lymph nodes with no cystic or necrotic nodes. 4. No involvement of the anatomic pelvis. 5. Study discussed by telephone with Dr. Vonna Kotyk LONG on 04/19/2016 at 15:52 . Electronically Signed   By: Genevie Ann M.D.   On: 04/19/2016 15:54    Procedures Procedures (including critical care time)  CRITICAL CARE Performed by: Margette Fast Total critical care time: 30 minutes Critical care time was exclusive of separately billable procedures and treating other patients. Critical care was necessary to treat or prevent imminent or life-threatening deterioration. Critical care was time spent personally by me on the following activities: development of treatment plan with patient and/or surrogate as well as nursing, discussions with consultants, evaluation of patient's response to treatment, examination of patient, obtaining history from patient or surrogate, ordering and performing treatments and interventions, ordering and review of laboratory studies, ordering and review of radiographic studies, pulse oximetry and re-evaluation of patient's condition.  Nanda Quinton, MD Emergency Medicine   Medications Ordered in ED Medications  metroNIDAZOLE (FLAGYL) IVPB 500 mg (0 mg Intravenous Stopped 04/19/16 1428)    aztreonam (AZACTAM) 1 g in dextrose 5 % 50 mL IVPB (not administered)  vancomycin (VANCOCIN) 1,750 mg in sodium chloride 0.9 % 500 mL IVPB (not administered)  sodium chloride 0.9 % bolus 500 mL (0 mLs Intravenous Stopped 04/19/16 1400)  sodium chloride 0.9 % bolus 1,000 mL (0 mLs Intravenous Stopped 04/19/16 1255)  aztreonam (AZACTAM) 2 g in dextrose 5 % 50 mL IVPB (0 g Intravenous Stopped 04/19/16 1324)  vancomycin (VANCOCIN) 2,000 mg in sodium chloride 0.9 % 500 mL IVPB (0 mg Intravenous Stopped 04/19/16 1533)  insulin aspart (novoLOG) injection 8 Units (8 Units Subcutaneous Given 04/19/16 1327)  iopamidol (ISOVUE-300) 61 % injection 80 mL (75 mLs Intravenous Contrast Given 04/19/16 1514)     Initial Impression / Assessment and Plan / ED Course  I have reviewed the triage vital signs and the nursing notes.  Pertinent labs & imaging results that were available during my care of the patient were reviewed by me and considered in my medical decision making (see chart for details).  Patient resents to the emergency department for evaluation of severe left inguinal abscess with some active drainage. Patient has associated tachycardia has been having subjective fevers at home for the past 3 days. The area is very large and pictured above. There is induration extending to the perineum. I started the patient on IV fluids and antibiotics and will obtain a CT scan of the pelvis. I expect that given the size of this abscess and require surgical evaluation and likely treatment in the OR as opposed to bedside drainage. Will keep the patient NPO at this time.   Spoke with Radiology regarding the read and concern for necrotizing infection and a gas-forming organism. Started Abx and IVF previously. Will discuss with general surgery for admission and consideration of rapid debridement. Updated patient.   04:45 PM Spoke with Dr. Grandville Silos with General Surgery at [facility] who recommends hospital admission at [facility] and  they will consult upon his arrival for consideration of surgical debridement. Patient's lactate is down trending at this time. He is awake and alert with tachycardia that is improved after fluids. I updated the patient regarding CT scan results and need for admission and surgical intervention. Patient understands and is in agreement with plan.   Discussed patient's case with Hospitalist, Dr. Lorin Mercy. Patient and family (if present) updated with plan. Care transferred to Hospitalist service.  I reviewed all nursing notes, vitals, pertinent old records, EKGs, labs, imaging (as available).   Final Clinical Impressions(s) / ED Diagnoses   Final diagnoses:  Cellulitis of other specified site  Sepsis, due to unspecified organism [facility])   I personally performed the services described in this documentation, which was scribed in my presence. The recorded information has been reviewed and is accurate.   Nanda Quinton, MD   Margette Fast, MD 04/19/16 [id number]

## 2016-04-19 NOTE — H&P (Signed)
History and Physical    Kevin Goodman [id number] DOB: January 04, 1951 DOA: 04/19/2016  PCP: Maricela Curet, MD Consultants:  None Patient coming from: home - lives with wife; NOK: wife, [phone]  Chief Complaint: groin infection  HPI: Kevin Goodman is a 66 y.o. male with medical history significant of HTN, DM, afib, gout, and morbid obesity presenting with left groin infection that started this weekend.  He thought he had a little boil in his left upper thigh/groin region and it started getting bigger and bigger.  It has been "draining a whole lot of pus and red blood and jelly-looking stuff, it's awful nasty."  No h/o similar issues.  No sick contacts (wife has cancer).  Denies h/o MRSA.  Has been feeling hot/sweaty for about a week.  Lost his appetite this weekend.  Sugars have been out of control since since last week.  Saw Dr. Lorriane Shire last Monday for his sugar, didn't think about mentioning the abscess.  Coughing frequently, gets cramps in his abdomen.  Has had a cold, feels like the flu, 3 weeks or so.  Cough is still ongoing.  Cough productive of yellowish-green sputum.  He did not mention this last week at his doctor's appointment either.  He was treated with Vanc/Flagyl/Aztreonam in the ER and the patient was seen by Dr. Arnoldo Morale.  Dr. Arnoldo Morale was concerned about gas-formin organisms and suggested transfer to [facility].  Dr. Grandville Silos spoke directly to Dr. Arnoldo Morale and agreed to consult on the patient upon arrival.   Review of Systems: As per HPI; otherwise 10 point review of systems reviewed and negative.   Ambulatory Status:  Ambulates without assistance.  Past Medical History:  Diagnosis Date  . Atrial fibrillation ([place])   . Diabetes mellitus without complication ([place])   . Gout   . Hypertension     Past Surgical History:  Procedure Laterality Date  . FINGER SURGERY     left ring    Social History   Social History  . Marital status: Married    Spouse name: N/A    . Number of children: N/A  . Years of education: N/A   Occupational History  . Security [employer]   Social History Main Topics  . Smoking status: Current Some Day Smoker    Types: Cigars  . Smokeless tobacco: Never Used  . Alcohol use No  . Drug use: No  . Sexual activity: Yes   Other Topics Concern  . Not on file   Social History Narrative  . No narrative on file    Allergies  Allergen Reactions  . Shrimp [Shellfish Allergy] Anaphylaxis  . Penicillins Other (See Comments)    Childhood allergy    Family History  Problem Relation Age of Onset  . Heart failure Mother   . Hypertension Mother   . Hypertension Father   . Stroke Father   . Stroke Brother     Prior to Admission medications   Medication Sig Start Date End Date Taking? Authorizing Provider  allopurinol (ZYLOPRIM) 300 MG tablet Take 300 mg by mouth daily.   Yes Historical Provider, MD  clobetasol cream (TEMOVATE) [id number] % Apply 1 application topically 2 (two) times daily as needed.   Yes Historical Provider, MD  metFORMIN (GLUCOPHAGE) 500 MG tablet Take 500 mg by mouth 2 (two) times daily with a meal.   Yes Historical Provider, MD  metoprolol succinate (TOPROL-XL) 50 MG 24 hr tablet Take 50 mg by mouth daily. Take with or immediately following  a meal.   Yes Historical Provider, MD    Physical Exam: Vitals:   04/19/16 1730 04/19/16 1800 04/19/16 1830 04/19/16 2019  BP: 120/68 120/68 120/68 123/72  Pulse: 102 109 90 80  Resp: 24 26 22 20   Temp:  98.2 F (36.8 C) 98.2 F (36.8 C) 98.5 F (36.9 C)  TempSrc:  Oral  Oral  SpO2: 97% 97% 97% 95%  Weight:    (!) 168.9 kg (372 lb 5.7 oz)  Height:    6\' 1"  (1.854 m)     General:  Appears calm and comfortable and is NAD; morbidly obese Eyes:  PERRL, EOMI, normal lids, iris ENT:  grossly normal hearing, lips & tongue, mmm Neck:  no LAD, masses or thyromegaly Cardiovascular:  RRR, no m/r/g. No LE edema.  Respiratory:  CTA bilaterally, no w/r/r.  Normal respiratory effort.  Occasional cough. Abdomen:  soft, ntnd, NABS Skin: extremely large (20x15 cm) and protuberant mass in the left groin that has fluctuance, friability, and purulent drainage.  There is erythema that extends toward the perineum.  Picture is available in ER note. Musculoskeletal:  grossly normal tone BUE/BLE, good ROM, no bony abnormality Psychiatric:  grossly normal mood and affect, speech fluent and appropriate, AOx3 Neurologic:  CN 2-12 grossly intact, moves all extremities in coordinated fashion, sensation intact  Labs on Admission: I have personally reviewed following labs and imaging studies  CBC:  Recent Labs Lab 04/19/16 1153  WBC 20.3*  NEUTROABS 15.2*  HGB 13.9  HCT 40.5  MCV 90.6  PLT 123456   Basic Metabolic Panel:  Recent Labs Lab 04/19/16 1153  NA 126*  K 3.7  CL 92*  CO2 19*  GLUCOSE 673*  BUN 28*  CREATININE 2.06*  CALCIUM 8.6*   GFR: Estimated Creatinine Clearance: 60 mL/min (by C-G formula based on SCr of 2.06 mg/dL (H)). Liver Function Tests:  Recent Labs Lab 04/19/16 1153  AST 28  ALT 25  ALKPHOS 60  BILITOT 0.8  PROT 7.3  ALBUMIN 2.9*   No results for input(s): LIPASE, AMYLASE in the last 168 hours. No results for input(s): AMMONIA in the last 168 hours. Coagulation Profile:  Recent Labs Lab 04/19/16 1153  INR 1.21   Cardiac Enzymes: No results for input(s): CKTOTAL, CKMB, CKMBINDEX, TROPONINI in the last 168 hours. BNP (last 3 results) No results for input(s): PROBNP in the last 8760 hours. HbA1C: No results for input(s): HGBA1C in the last 72 hours. CBG:  Recent Labs Lab 04/19/16 1142 04/19/16 1539  GLUCAP >600* 390*   Lipid Profile: No results for input(s): CHOL, HDL, LDLCALC, TRIG, CHOLHDL, LDLDIRECT in the last 72 hours. Thyroid Function Tests: No results for input(s): TSH, T4TOTAL, FREET4, T3FREE, THYROIDAB in the last 72 hours. Anemia Panel: No results for input(s): VITAMINB12, FOLATE,  FERRITIN, TIBC, IRON, RETICCTPCT in the last 72 hours. Urine analysis:    Component Value Date/Time   COLORURINE YELLOW 07/16/2011 [place] 07/16/2011 1407   LABSPEC 1.010 07/16/2011 1407   PHURINE 6.0 07/16/2011 1407   GLUCOSEU NEGATIVE 07/16/2011 1407   HGBUR TRACE (A) 07/16/2011 1407   BILIRUBINUR NEGATIVE 07/16/2011 1407   KETONESUR NEGATIVE 07/16/2011 1407   PROTEINUR NEGATIVE 07/16/2011 1407   UROBILINOGEN 0.2 07/16/2011 1407   NITRITE NEGATIVE 07/16/2011 1407   LEUKOCYTESUR SMALL (A) 07/16/2011 1407    Creatinine Clearance: Estimated Creatinine Clearance: 60 mL/min (by C-G formula based on SCr of 2.06 mg/dL (H)).  Sepsis Labs: @LABRCNTIP (procalcitonin:4,lacticidven:4) ) Recent Results (from the  past 240 hour(s))  Culture, blood (routine x 2)     Status: None (Preliminary result)   Collection Time: 04/19/16 12:30 PM  Result Value Ref Range Status   Specimen Description BLOOD LEFT ARM  Final   Special Requests BOTTLES DRAWN AEROBIC AND ANAEROBIC 6 CC EACH  Final   Culture PENDING  Incomplete   Report Status PENDING  Incomplete  Culture, blood (routine x 2)     Status: None (Preliminary result)   Collection Time: 04/19/16 12:44 PM  Result Value Ref Range Status   Specimen Description BLOOD LEFT HAND  Final   Special Requests BOTTLES DRAWN AEROBIC AND ANAEROBIC 6 CC EACH  Final   Culture PENDING  Incomplete   Report Status PENDING  Incomplete     Radiological Exams on Admission: Dg Chest 2 View  Result Date: 04/19/2016 CLINICAL DATA:  large abscess on left groin. Began on scrotum and has spread to leg. History of A-FIB, DM, HTN. Pt refused to stand. EXAM: CHEST  2 VIEW COMPARISON:  07/16/2011 FINDINGS: The heart size and mediastinal contours are within normal limits. Both lungs are clear. The visualized skeletal structures are unremarkable. IMPRESSION: No active cardiopulmonary disease. Electronically Signed   By: Nolon Nations M.D.   On: 04/19/2016  18:22   Ct Pelvis W Contrast  Result Date: 04/19/2016 CLINICAL DATA:  66 year old male with left groin infection and abscess beginning 2 weeks ago. Diabetic. Initial encounter. EXAM: CT PELVIS WITH CONTRAST TECHNIQUE: Multidetector CT imaging of the pelvis was performed using the standard protocol following the bolus administration of intravenous contrast. CONTRAST:  40mL ISOVUE-300 IOPAMIDOL (ISOVUE-300) INJECTION 61% COMPARISON:  None. FINDINGS: Urinary Tract: Distal ureters and urinary bladder appear unremarkable. Kidneys not included. Bowel: Negative visible large bowel, it including a partially visible normal retrocecal appendix. Negative visible small bowel. Vascular/Lymphatic: Suboptimal intravascular contrast in part related to decreased contrast dose in the setting of renal insufficiency. Mild femoral artery calcified peripheral vascular disease. No right inguinal or right iliac lymphadenopathy. See left side node details below. Reproductive: The scrotum itself appears to be spared except for superficial skin thickening. There is no scrotal gas. However, there is a small area of scrotal fat stranding on the right side (coronal image 36). Other: Left inguinal subcutaneous gas and confluent fat stranding beginning just below the left inguinal ligament and continuing distally into the anterior and medial thigh. Clustered gas near the skin surface on series 2, image 69. Reactive appearing left inguinal and left iliac lymph nodes, measuring individually up to 17 mm short axis. No cystic or necrotic nodes. No drainable fluid collection. Musculoskeletal: Motion artifact at both hip joints. Moderate to severe chronic degenerative changes in the visible lumbar spine. No acute osseous abnormality identified. IMPRESSION: 1. Soft tissue infection suspicious for Necrotizing Fasciitis or infection by a gas-forming organism. Inflammation with abundant subcutaneous gas extending from the left groin at the level of the  inguinal ligament tracking into the anterior and medial left thigh. No associated abscess or fluid collection. 2. The scrotum appears largely spared except for skin thickening and a small area of right superior scrotal subcutaneous inflammation (series 4, image 36). 3. Reactive left inguinal and iliac station lymph nodes with no cystic or necrotic nodes. 4. No involvement of the anatomic pelvis. 5. Study discussed by telephone with Dr. Vonna Kotyk LONG on 04/19/2016 at 15:52 . Electronically Signed   By: Genevie Ann M.D.   On: 04/19/2016 15:54    EKG: Independently reviewed.  Sinus tachycardia  with rate 100; no evidence of acute ischemia  Assessment/Plan Principal Problem:   Sepsis ([place]) Active Problems:   Atrial fibrillation ([facility])   Morbid obesity ([facility])   Type 2 diabetes mellitus ([facility])   Abscess   Acute renal failure ([facility])   Acute bronchitis   Sepsis due to an abscess/subcutaneous infection from a gas-forming organism/necrotizing fasciitis --Elevated WBC count (20.3), tachycardia with elevated lactate to 3.0 (repeat 2.3)  -Sepsis protocol initiated -Source of infection is left groin/upper thigh and is concerning for gas-forming organism/nec fasc -Patient was seen in the [facility] ER by Dr. Arnoldo Morale, who recommends transfer to [facility]; he has spoken with Dr. Grandville Silos, who agrees to see the patient upon arrival -Blood cultures pending -Will admit with telemetry and continue to monitor -Treat with IV Vanc/Aztreonam (no Zosyn due to a PCN allergy)/Clindamycin -Will add HIV -Will trend lactate to ensure improvement -Due to high likelihood of need for operative intervention, possibly on multiple occasions, will hold Lovenox for now -INR 1.21  Acute renal failure -Creatinine 2.06, prior 1.21 -Related to dehydration in the setting of serious infection -Will attempt to hold nephrotoxic medications (with Vanc dosing per pharmacy) -Rehydrate and follow  DM, poor control in the setting of active  infection -Glucose 673, repeat 390 -Sodium 126, corrected 135 -Already improving with IV insulin -He is not in DKA and should not require a drip at this time -Anticipate ongoing improvement with insulin and rehydration -Last A1c in Epic was in 2013 and was 7.6; will repeat -He is only taking Metformin 500 mg monotherapy and so has plenty of room for titration once stabilized -For now, will add 10 units Lantus qhs and cover with SSI -He is NPO at this time -Consider diabetic education when more clinically stable  Malnutrition/morbid obesity -Albumin 2.9 -He is currently NPO in anticipation of need for operative repair -Consider nutrition consult once able to eat -He seems very receptive to assistance with weight loss  Bronchitis -Patient with recent URI, now with persistent cough -No SOB or hypoxia -Negative CXR -Suspect bronchitis -Should not need (additional) antibiotics -Will give Albuterol nebs prn  H/o afib -Currently in NSR on telemetry, EKG, and exam -Not on anticoagulation at this time -Tachycardia is likely related to current infection -Will monitor on telemetry   DVT prophylaxis:  SCDs Code Status: Full - confirmed with patient Family Communication: None present Disposition Plan:  Home once clinically improved Consults called: Surgery (called by ER doctor and Dr. Arnoldo Morale so not recalled by hospitalist; order placed for call once patient arrives at [facility])  Admission status: Admit - It is my clinical opinion that admission to INPATIENT is reasonable and necessary because this patient will require at least 2 midnights in the hospital to treat this condition based on the medical complexity of the problems presented.  Given the aforementioned information, the predictability of an adverse outcome is felt to be significant.    Karmen Bongo MD [facility]  If 7PM-7AM, please contact night-coverage [email or web address] Password [facility]  04/19/2016, 9:02 PM

## 2016-04-19 NOTE — Consult Note (Signed)
Reason for Consult:necrotizing fasciitis Referring Physician: Rashaun, Wichert is an 66 y.o. male.  HPI: 66 yo male with uncontrolled diabetes who has recently been off his medications. He notes being ill with a cold the past week. He had a boil for the last 2 weeks that increased in size over the weekend. He went to a doctor today and was immediately sent to the hospital where he was diagnosed with a gas forming bacterial infection and transferred to [facility] for higher level care. He notes general malaise, subjective fevers, and poor appetite.  Past Medical History:  Diagnosis Date  . Atrial fibrillation ([place])   . Diabetes mellitus without complication ([place])   . Gout   . Hypertension     Past Surgical History:  Procedure Laterality Date  . FINGER SURGERY     left ring    Family History  Problem Relation Age of Onset  . Heart failure Mother   . Hypertension Mother   . Hypertension Father   . Stroke Father   . Stroke Brother     Social History:  reports that he has been smoking Cigars.  He has never used smokeless tobacco. He reports that he does not drink alcohol or use drugs.  Allergies:  Allergies  Allergen Reactions  . Shrimp [Shellfish Allergy] Anaphylaxis  . Penicillins Other (See Comments)    Childhood allergy    Medications: I have reviewed the patient's current medications.  Results for orders placed or performed during the hospital encounter of 04/19/16 (from the past 48 hour(s))  CBG monitoring, ED     Status: Abnormal   Collection Time: 04/19/16 11:42 AM  Result Value Ref Range   Glucose-Capillary >600 (HH) 65 - 99 mg/dL  CBC with Differential     Status: Abnormal   Collection Time: 04/19/16 11:53 AM  Result Value Ref Range   WBC 20.3 (H) 4.0 - 10.5 K/uL    Comment: WHITE COUNT CONFIRMED ON SMEAR   RBC 4.47 4.22 - 5.81 MIL/uL   Hemoglobin 13.9 13.0 - 17.0 g/dL   HCT 40.5 39.0 - 52.0 %   MCV 90.6 78.0 - 100.0 fL   MCH 31.1 26.0 - 34.0 pg   MCHC 34.3 30.0 - 36.0 g/dL   RDW 14.1 11.5 - 15.5 %   Platelets 280 150 - 400 K/uL   Neutrophils Relative % 75 %   Lymphocytes Relative 17 %   Monocytes Relative 8 %   Eosinophils Relative 0 %   Basophils Relative 0 %   Neutro Abs 15.2 (H) 1.7 - 7.7 K/uL   Lymphs Abs 3.5 0.7 - 4.0 K/uL   Monocytes Absolute 1.6 (H) 0.1 - 1.0 K/uL   Eosinophils Absolute 0.0 0.0 - 0.7 K/uL   Basophils Absolute 0.0 0.0 - 0.1 K/uL   WBC Morphology MILD LEFT SHIFT (1-5% METAS, OCC MYELO, OCC BANDS)     Comment: ATYPICAL LYMPHOCYTES  Comprehensive metabolic panel     Status: Abnormal   Collection Time: 04/19/16 11:53 AM  Result Value Ref Range   Sodium 126 (L) 135 - 145 mmol/L   Potassium 3.7 3.5 - 5.1 mmol/L   Chloride 92 (L) 101 - 111 mmol/L   CO2 19 (L) 22 - 32 mmol/L   Glucose, Bld 673 (HH) 65 - 99 mg/dL    Comment: CRITICAL RESULT CALLED TO, READ BACK BY AND VERIFIED WITH: MINTER,R AT 1310 ON 04/19/2016 BY ISLEY,B    BUN 28 (H) 6 - 20 mg/dL  Creatinine, Ser 2.06 (H) 0.61 - 1.24 mg/dL   Calcium 8.6 (L) 8.9 - 10.3 mg/dL   Total Protein 7.3 6.5 - 8.1 g/dL   Albumin 2.9 (L) 3.5 - 5.0 g/dL   AST 28 15 - 41 U/L   ALT 25 17 - 63 U/L   Alkaline Phosphatase 60 38 - 126 U/L   Total Bilirubin 0.8 0.3 - 1.2 mg/dL   GFR calc non Af Amer 33 (L) >60 mL/min   GFR calc Af Amer 38 (L) >60 mL/min    Comment: (NOTE) The eGFR has been calculated using the CKD EPI equation. This calculation has not been validated in all clinical situations. eGFR's persistently <60 mL/min signify possible Chronic Kidney Disease.    Anion gap 15 5 - 15  Protime-INR     Status: Abnormal   Collection Time: 04/19/16 11:53 AM  Result Value Ref Range   Prothrombin Time 15.4 (H) 11.4 - 15.2 seconds   INR 1.21   Culture, blood (routine x 2)     Status: None (Preliminary result)   Collection Time: 04/19/16 12:30 PM  Result Value Ref Range   Specimen Description BLOOD LEFT ARM    Special Requests BOTTLES DRAWN AEROBIC AND ANAEROBIC  6 CC EACH    Culture PENDING    Report Status PENDING   Culture, blood (routine x 2)     Status: None (Preliminary result)   Collection Time: 04/19/16 12:44 PM  Result Value Ref Range   Specimen Description BLOOD LEFT HAND    Special Requests BOTTLES DRAWN AEROBIC AND ANAEROBIC 6 CC EACH    Culture PENDING    Report Status PENDING   Lactic acid, plasma     Status: Abnormal   Collection Time: 04/19/16 12:48 PM  Result Value Ref Range   Lactic Acid, Venous 3.0 (HH) 0.5 - 1.9 mmol/L    Comment: CRITICAL RESULT CALLED TO, READ BACK BY AND VERIFIED WITH: MINTER,R AT 1355 ON 04/19/2016 BY ISLEY,B   POC CBG, ED     Status: Abnormal   Collection Time: 04/19/16  3:39 PM  Result Value Ref Range   Glucose-Capillary 390 (H) 65 - 99 mg/dL  Lactic acid, plasma     Status: Abnormal   Collection Time: 04/19/16  3:49 PM  Result Value Ref Range   Lactic Acid, Venous 2.3 (HH) 0.5 - 1.9 mmol/L    Comment: CRITICAL RESULT CALLED TO, READ BACK BY AND VERIFIED WITH: MINTER,R AT 1620 ON 04/19/2016 BY ISLEY,B   Lactic acid, plasma     Status: Abnormal   Collection Time: 04/19/16  9:20 PM  Result Value Ref Range   Lactic Acid, Venous 2.2 (HH) 0.5 - 1.9 mmol/L    Comment: CRITICAL RESULT CALLED TO, READ BACK BY AND VERIFIED WITH: ROMESBURG,K RN 04/19/2016 2228 JORDANS   Procalcitonin     Status: None   Collection Time: 04/19/16  9:20 PM  Result Value Ref Range   Procalcitonin 2.83 ng/mL    Comment:        Interpretation: PCT > 2 ng/mL: Systemic infection (sepsis) is likely, unless other causes are known. (NOTE)         ICU PCT Algorithm               Non ICU PCT Algorithm    ----------------------------     ------------------------------         PCT < 0.25 ng/mL  PCT < 0.1 ng/mL     Stopping of antibiotics            Stopping of antibiotics       strongly encouraged.               strongly encouraged.    ----------------------------     ------------------------------       PCT  level decrease by               PCT < 0.25 ng/mL       >= 80% from peak PCT       OR PCT 0.25 - 0.5 ng/mL          Stopping of antibiotics                                             encouraged.     Stopping of antibiotics           encouraged.    ----------------------------     ------------------------------       PCT level decrease by              PCT >= 0.25 ng/mL       < 80% from peak PCT        AND PCT >= 0.5 ng/mL            Continuing antibiotics                                               encouraged.       Continuing antibiotics            encouraged.    ----------------------------     ------------------------------     PCT level increase compared          PCT > 0.5 ng/mL         with peak PCT AND          PCT >= 0.5 ng/mL             Escalation of antibiotics                                          strongly encouraged.      Escalation of antibiotics        strongly encouraged.   APTT     Status: None   Collection Time: 04/19/16  9:20 PM  Result Value Ref Range   aPTT 36 24 - 36 seconds  Glucose, capillary     Status: Abnormal   Collection Time: 04/19/16 10:59 PM  Result Value Ref Range   Glucose-Capillary 332 (H) 65 - 99 mg/dL    Dg Chest 2 View  Result Date: 04/19/2016 CLINICAL DATA:  large abscess on left groin. Began on scrotum and has spread to leg. History of A-FIB, DM, HTN. Pt refused to stand. EXAM: CHEST  2 VIEW COMPARISON:  07/16/2011 FINDINGS: The heart size and mediastinal contours are within normal limits. Both lungs are clear. The visualized skeletal structures are unremarkable. IMPRESSION: No active cardiopulmonary disease. Electronically Signed   By: Nolon Nations M.D.   On: 04/19/2016 18:22  Ct Pelvis W Contrast  Result Date: 04/19/2016 CLINICAL DATA:  66 year old male with left groin infection and abscess beginning 2 weeks ago. Diabetic. Initial encounter. EXAM: CT PELVIS WITH CONTRAST TECHNIQUE: Multidetector CT imaging of the pelvis was  performed using the standard protocol following the bolus administration of intravenous contrast. CONTRAST:  41m ISOVUE-300 IOPAMIDOL (ISOVUE-300) INJECTION 61% COMPARISON:  None. FINDINGS: Urinary Tract: Distal ureters and urinary bladder appear unremarkable. Kidneys not included. Bowel: Negative visible large bowel, it including a partially visible normal retrocecal appendix. Negative visible small bowel. Vascular/Lymphatic: Suboptimal intravascular contrast in part related to decreased contrast dose in the setting of renal insufficiency. Mild femoral artery calcified peripheral vascular disease. No right inguinal or right iliac lymphadenopathy. See left side node details below. Reproductive: The scrotum itself appears to be spared except for superficial skin thickening. There is no scrotal gas. However, there is a small area of scrotal fat stranding on the right side (coronal image 36). Other: Left inguinal subcutaneous gas and confluent fat stranding beginning just below the left inguinal ligament and continuing distally into the anterior and medial thigh. Clustered gas near the skin surface on series 2, image 69. Reactive appearing left inguinal and left iliac lymph nodes, measuring individually up to 17 mm short axis. No cystic or necrotic nodes. No drainable fluid collection. Musculoskeletal: Motion artifact at both hip joints. Moderate to severe chronic degenerative changes in the visible lumbar spine. No acute osseous abnormality identified. IMPRESSION: 1. Soft tissue infection suspicious for Necrotizing Fasciitis or infection by a gas-forming organism. Inflammation with abundant subcutaneous gas extending from the left groin at the level of the inguinal ligament tracking into the anterior and medial left thigh. No associated abscess or fluid collection. 2. The scrotum appears largely spared except for skin thickening and a small area of right superior scrotal subcutaneous inflammation (series 4, image  36). 3. Reactive left inguinal and iliac station lymph nodes with no cystic or necrotic nodes. 4. No involvement of the anatomic pelvis. 5. Study discussed by telephone with Dr. JVonna KotykLONG on 04/19/2016 at 15:52 . Electronically Signed   By: HGenevie AnnM.D.   On: 04/19/2016 15:54    Review of Systems  Constitutional: Positive for chills, fever and malaise/fatigue.  HENT: Negative for hearing loss.   Eyes: Negative for blurred vision and double vision.  Respiratory: Negative for cough and hemoptysis.   Cardiovascular: Negative for chest pain and palpitations.  Gastrointestinal: Negative for abdominal pain, nausea and vomiting.  Genitourinary: Negative for dysuria and urgency.  Musculoskeletal: Negative for myalgias and neck pain.  Skin: Positive for itching and rash.  Neurological: Negative for dizziness, tingling and headaches.  Endo/Heme/Allergies: Does not bruise/bleed easily.  Psychiatric/Behavioral: Negative for depression and suicidal ideas.   Blood pressure 123/72, pulse 80, temperature 98.5 F (36.9 C), temperature source Oral, resp. rate 20, height '6\' 1"'  (1.854 m), weight (!) 168.9 kg (372 lb 5.7 oz), SpO2 95 %. Physical Exam  Nursing note and vitals reviewed. Constitutional: He is oriented to person, place, and time. He appears well-developed and well-nourished.  HENT:  Head: Normocephalic and atraumatic.  Eyes: Conjunctivae and EOM are normal. No scleral icterus.  Neck: Normal range of motion. Neck supple.  Cardiovascular: Normal rate and regular rhythm.   Respiratory: Effort normal and breath sounds normal. He has no wheezes. He has no rales. He exhibits no tenderness.  GI: Soft. He exhibits no distension. There is no tenderness. There is no rebound.  Musculoskeletal: Normal range of motion.  He exhibits no edema.  Neurological: He is alert and oriented to person, place, and time.  Skin: Skin is warm and dry.  Large left inner thigh abscess with skin changes consistent with  necrosis, total length of inflammation approximately 15cm x 10cm, sparing of the groin crease  Psychiatric: He has a normal mood and affect. His behavior is normal.      Assessment/Plan: 66 yo mal with diabetes mellitus who presents with left thigh soft tissue infection concerning for necrotizing fasciitis -debridement in OR -plan for long term wound care -broad spectrum antibiotics.  Arta Bruce Kinsinger 04/19/2016, 11:12 PM

## 2016-04-19 NOTE — Progress Notes (Signed)
CRITICAL VALUE ALERT  Critical value received:  Lactic Acid 2.2  Date of notification:  04/19/2016  Time of notification:  2227  Critical value read back:Yes.    Nurse who received alert:  Clydell Hakim RN  MD notified (1st page):  Np. Baltazar Najjar  Time of first page:  2229  MD notified (2nd page):  Time of second page:  Responding MD:  NP Baltazar Najjar  Time MD responded:  2236

## 2016-04-19 NOTE — ED Notes (Signed)
Patient transported to X-ray 

## 2016-04-19 NOTE — ED Triage Notes (Signed)
Pt reports a large abscess on left groin.  Began on scrotum and has spread to leg.  Is draining through clothing at triage with extremely bad smell.

## 2016-04-19 NOTE — ED Notes (Signed)
[facility] at [facility] at this time and report given to Santiago Glad, [place] for [phone]

## 2016-04-19 NOTE — Progress Notes (Signed)
RN stated she had not called surgery yet to inform of pt's arrival to [facility]. NP paged surgeon on call.  KJKG, NP [facility]

## 2016-04-20 ENCOUNTER — Inpatient Hospital Stay (HOSPITAL_COMMUNITY): Payer: BC Managed Care – PPO | Admitting: Anesthesiology

## 2016-04-20 ENCOUNTER — Encounter (HOSPITAL_COMMUNITY)
Admission: EM | Disposition: A | Discharge status: Home Health | Payer: Self-pay | Source: Home / Self Care | Attending: Internal Medicine

## 2016-04-20 HISTORY — PX: INCISION AND DRAINAGE OF WOUND: SHX1803

## 2016-04-20 LAB — LACTIC ACID, PLASMA: Lactic Acid, Venous: 1.7 mmol/L (ref 0.5–1.9)

## 2016-04-20 LAB — GLUCOSE, CAPILLARY
GLUCOSE-CAPILLARY: 198 mg/dL — AB (ref 65–99)
GLUCOSE-CAPILLARY: 269 mg/dL — AB (ref 65–99)
GLUCOSE-CAPILLARY: 270 mg/dL — AB (ref 65–99)
GLUCOSE-CAPILLARY: 279 mg/dL — AB (ref 65–99)
GLUCOSE-CAPILLARY: 282 mg/dL — AB (ref 65–99)
Glucose-Capillary: 336 mg/dL — ABNORMAL HIGH (ref 65–99)
Glucose-Capillary: 241 mg/dL — ABNORMAL HIGH (ref 65–99)
Glucose-Capillary: 276 mg/dL — ABNORMAL HIGH (ref 65–99)

## 2016-04-20 LAB — POCT I-STAT 4, (NA,K, GLUC, HGB,HCT)
Glucose, Bld: 337 mg/dL — ABNORMAL HIGH (ref 65–99)
HEMATOCRIT: 37 % — AB (ref 39.0–52.0)
HEMOGLOBIN: 12.6 g/dL — AB (ref 13.0–17.0)
Potassium: 4.1 mmol/L (ref 3.5–5.1)
SODIUM: 135 mmol/L (ref 135–145)

## 2016-04-20 LAB — CBC
HEMATOCRIT: 34.1 % — AB (ref 39.0–52.0)
Hemoglobin: 11.3 g/dL — ABNORMAL LOW (ref 13.0–17.0)
MCH: 30.1 pg (ref 26.0–34.0)
MCHC: 33.1 g/dL (ref 30.0–36.0)
MCV: 90.7 fL (ref 78.0–100.0)
PLATELETS: 251 10*3/uL (ref 150–400)
RBC: 3.76 MIL/uL — AB (ref 4.22–5.81)
RDW: 14.2 % (ref 11.5–15.5)
WBC: 18.2 10*3/uL — ABNORMAL HIGH (ref 4.0–10.5)

## 2016-04-20 LAB — BASIC METABOLIC PANEL
Anion gap: 9 (ref 5–15)
BUN: 21 mg/dL — AB (ref 6–20)
CHLORIDE: 102 mmol/L (ref 101–111)
CO2: 24 mmol/L (ref 22–32)
CREATININE: 1.29 mg/dL — AB (ref 0.61–1.24)
Calcium: 8.4 mg/dL — ABNORMAL LOW (ref 8.9–10.3)
GFR calc Af Amer: >60 mL/min (ref >60)
GFR calc non Af Amer: 57 mL/min — ABNORMAL LOW (ref >60)
Glucose, Bld: 282 mg/dL — ABNORMAL HIGH (ref 65–99)
POTASSIUM: 3.9 mmol/L (ref 3.5–5.1)
Sodium: 135 mmol/L (ref 135–145)

## 2016-04-20 LAB — HIV ANTIBODY (ROUTINE TESTING W REFLEX): HIV SCREEN 4TH GENERATION: NONREACTIVE

## 2016-04-20 LAB — SURGICAL PCR SCREEN
MRSA, PCR: NEGATIVE
STAPHYLOCOCCUS AUREUS: NEGATIVE

## 2016-04-20 SURGERY — IRRIGATION AND DEBRIDEMENT WOUND
Anesthesia: General | Site: Thigh | Laterality: Left

## 2016-04-20 MED ORDER — LIDOCAINE HCL (CARDIAC) 20 MG/ML IV SOLN
INTRAVENOUS | Status: DC | PRN
  Administered 2016-04-20: 60 mg via INTRATRACHEAL

## 2016-04-20 MED ORDER — INSULIN ASPART 100 UNIT/ML ~~LOC~~ SOLN
SUBCUTANEOUS | Status: AC
  Filled 2016-04-20: qty 1

## 2016-04-20 MED ORDER — HYDROCODONE-ACETAMINOPHEN 5-325 MG PO TABS
1.0000 | ORAL_TABLET | ORAL | Status: DC | PRN
  Administered 2016-04-20 – 2016-04-24 (×5): 1 via ORAL
  Filled 2016-04-20 (×5): qty 1

## 2016-04-20 MED ORDER — PROPOFOL 10 MG/ML IV BOLUS
INTRAVENOUS | Status: AC
  Filled 2016-04-20: qty 20

## 2016-04-20 MED ORDER — SUGAMMADEX SODIUM 200 MG/2ML IV SOLN
INTRAVENOUS | Status: AC
  Filled 2016-04-20: qty 2

## 2016-04-20 MED ORDER — SODIUM CHLORIDE 0.9 % IV SOLN
INTRAVENOUS | Status: DC
  Filled 2016-04-20: qty 2.5

## 2016-04-20 MED ORDER — ROCURONIUM BROMIDE 50 MG/5ML IV SOSY
PREFILLED_SYRINGE | INTRAVENOUS | Status: AC
  Filled 2016-04-20: qty 5

## 2016-04-20 MED ORDER — FENTANYL CITRATE (PF) 100 MCG/2ML IJ SOLN
INTRAMUSCULAR | Status: AC
  Filled 2016-04-20: qty 2

## 2016-04-20 MED ORDER — 0.9 % SODIUM CHLORIDE (POUR BTL) OPTIME
TOPICAL | Status: DC | PRN
Start: 2016-04-20 — End: 2016-04-20
  Administered 2016-04-20: 1000 mL

## 2016-04-20 MED ORDER — MIDAZOLAM HCL 2 MG/2ML IJ SOLN
INTRAMUSCULAR | Status: DC | PRN
  Administered 2016-04-20: 2 mg via INTRAVENOUS

## 2016-04-20 MED ORDER — PRO-STAT SUGAR FREE PO LIQD
30.0000 mL | Freq: Two times a day (BID) | ORAL | Status: DC
  Administered 2016-04-20 – 2016-04-25 (×11): 30 mL via ORAL
  Filled 2016-04-20 (×11): qty 30

## 2016-04-20 MED ORDER — LIDOCAINE 2% (20 MG/ML) 5 ML SYRINGE
INTRAMUSCULAR | Status: AC
  Filled 2016-04-20: qty 5

## 2016-04-20 MED ORDER — SODIUM CHLORIDE 0.9 % IV SOLN
INTRAVENOUS | Status: DC
Start: 2016-04-20 — End: 2016-04-21
  Administered 2016-04-20 – 2016-04-21 (×2): via INTRAVENOUS

## 2016-04-20 MED ORDER — SODIUM CHLORIDE 0.9 % IV SOLN
INTRAVENOUS | Status: DC | PRN
  Administered 2016-04-20: 02:00:00 via INTRAVENOUS

## 2016-04-20 MED ORDER — HYDROMORPHONE HCL 1 MG/ML IJ SOLN: 0.2500 mg | INTRAMUSCULAR | Status: DC | PRN

## 2016-04-20 MED ORDER — INSULIN ASPART 100 UNIT/ML ~~LOC~~ SOLN
SUBCUTANEOUS | Status: DC | PRN
  Administered 2016-04-20: 10 [IU] via SUBCUTANEOUS

## 2016-04-20 MED ORDER — MIDAZOLAM HCL 2 MG/2ML IJ SOLN
INTRAMUSCULAR | Status: AC
  Filled 2016-04-20: qty 2

## 2016-04-20 MED ORDER — SUCCINYLCHOLINE CHLORIDE 20 MG/ML IJ SOLN
INTRAMUSCULAR | Status: DC | PRN
  Administered 2016-04-20: 100 mg via INTRAVENOUS

## 2016-04-20 MED ORDER — HEPARIN SODIUM (PORCINE) 5000 UNIT/ML IJ SOLN
5000.0000 [IU] | Freq: Three times a day (TID) | INTRAMUSCULAR | Status: DC
  Administered 2016-04-20 – 2016-04-25 (×15): 5000 [IU] via SUBCUTANEOUS
  Filled 2016-04-20 (×14): qty 1

## 2016-04-20 MED ORDER — PROPOFOL 10 MG/ML IV BOLUS
INTRAVENOUS | Status: DC | PRN
  Administered 2016-04-20: 50 mg via INTRAVENOUS
  Administered 2016-04-20: 150 mg via INTRAVENOUS

## 2016-04-20 MED ORDER — FENTANYL CITRATE (PF) 100 MCG/2ML IJ SOLN
INTRAMUSCULAR | Status: DC | PRN
  Administered 2016-04-20: 100 ug via INTRAVENOUS

## 2016-04-20 MED ORDER — INSULIN GLARGINE 100 UNIT/ML ~~LOC~~ SOLN
25.0000 [IU] | Freq: Every day | SUBCUTANEOUS | Status: DC
  Administered 2016-04-20: 25 [IU] via SUBCUTANEOUS
  Filled 2016-04-20: qty 0.25

## 2016-04-20 MED ORDER — ADULT MULTIVITAMIN W/MINERALS CH
1.0000 | ORAL_TABLET | Freq: Every day | ORAL | Status: DC
  Administered 2016-04-20 – 2016-04-25 (×6): 1 via ORAL
  Filled 2016-04-20 (×6): qty 1

## 2016-04-20 MED ORDER — SUCCINYLCHOLINE CHLORIDE 200 MG/10ML IV SOSY
PREFILLED_SYRINGE | INTRAVENOUS | Status: AC
  Filled 2016-04-20: qty 10

## 2016-04-20 SURGICAL SUPPLY — 29 items
BLADE SURG 10 STRL SS (BLADE) ×3 IMPLANT
BNDG GAUZE ELAST 4 BULKY (GAUZE/BANDAGES/DRESSINGS) ×3 IMPLANT
CANISTER SUCTION 2500CC (MISCELLANEOUS) ×3 IMPLANT
COVER SURGICAL LIGHT HANDLE (MISCELLANEOUS) ×3 IMPLANT
DRSG PAD ABDOMINAL 8X10 ST (GAUZE/BANDAGES/DRESSINGS) ×3 IMPLANT
ELECT CAUTERY BLADE 6.4 (BLADE) ×3 IMPLANT
ELECT REM PT RETURN 9FT ADLT (ELECTROSURGICAL) ×3
ELECTRODE REM PT RTRN 9FT ADLT (ELECTROSURGICAL) ×1 IMPLANT
GLOVE BIOGEL PI IND STRL 7.5 (GLOVE) ×1 IMPLANT
GLOVE BIOGEL PI INDICATOR 7.5 (GLOVE) ×2
GLOVE SURG SS PI 7.0 STRL IVOR (GLOVE) ×3 IMPLANT
GOWN STRL REUS W/ TWL LRG LVL3 (GOWN DISPOSABLE) ×2 IMPLANT
GOWN STRL REUS W/TWL LRG LVL3 (GOWN DISPOSABLE) ×4
KIT BASIN OR (CUSTOM PROCEDURE TRAY) ×3 IMPLANT
KIT ROOM TURNOVER OR (KITS) ×3 IMPLANT
LEGGING LITHOTOMY PAIR STRL (DRAPES) ×3 IMPLANT
NS IRRIG 1000ML POUR BTL (IV SOLUTION) ×6 IMPLANT
PACK SURGICAL SETUP 50X90 (CUSTOM PROCEDURE TRAY) ×3 IMPLANT
PAD ARMBOARD 7.5X6 YLW CONV (MISCELLANEOUS) ×3 IMPLANT
PENCIL BUTTON HOLSTER BLD 10FT (ELECTRODE) ×3 IMPLANT
SUT SILK 2 0 (SUTURE) ×2
SUT SILK 2-0 18XBRD TIE 12 (SUTURE) ×1 IMPLANT
SWAB COLLECTION DEVICE MRSA (MISCELLANEOUS) ×3 IMPLANT
TOWEL OR 17X24 6PK STRL BLUE (TOWEL DISPOSABLE) ×3 IMPLANT
TOWEL OR 17X26 10 PK STRL BLUE (TOWEL DISPOSABLE) ×3 IMPLANT
TUBE ANAEROBIC SPECIMEN COL (MISCELLANEOUS) ×3 IMPLANT
TUBE CONNECTING 12'X1/4 (SUCTIONS) ×1
TUBE CONNECTING 12X1/4 (SUCTIONS) ×2 IMPLANT
YANKAUER SUCT BULB TIP NO VENT (SUCTIONS) ×3 IMPLANT

## 2016-04-20 NOTE — Progress Notes (Signed)
Report given to [facility], Therapist, sports.  Notified her anesthesia MD wants blood sugars monitored closely.  Will continue to monitor and notify for further changes.

## 2016-04-20 NOTE — Progress Notes (Signed)
At bedside with receiving RN and told her again anesthesia MD wants CBG checked Q1H.  Last CBG done around 0300.

## 2016-04-20 NOTE — Progress Notes (Signed)
Initial Nutrition Assessment  DOCUMENTATION CODES:   Morbid obesity  INTERVENTION:   -MVI daily -30 ml Prostat BID, each supplement provides 100 kcals and 15 grams protein -Provided "Carbohydrate Counting for People with Type 2 Diabetes", "Diabetes Label Reading Tips", and "Sodium Free Flavoring Tips" from AND's Nutrition Care Manual  NUTRITION DIAGNOSIS:   Increased nutrient needs related to chronic illness as evidenced by estimated needs.  GOAL:   Patient will meet greater than or equal to 90% of their needs  MONITOR:   PO intake, Supplement acceptance, Diet advancement, Labs, Weight trends, Skin, I & O's  REASON FOR ASSESSMENT:   Malnutrition Screening Tool    ASSESSMENT:   Kevin Goodman is a 66 y.o. male with medical history significant of HTN, DM, afib, gout, and morbid obesity presenting with left groin infection that started this weekend.  He thought he had a little boil in his left upper thigh/groin region and it started getting bigger and bigger.  It has been "draining a whole lot of pus and red blood and jelly-looking stuff, it's awful nasty."   S/p Procedure on 04/19/16: excisional debridement of left thigh, 18cm x 9cm area involving full thickness skin and soft tissue  Spoke with pt at bedside, who reports he generally has a good appetite. He shares that his "last good meal" was 4 days ago and he has had decreased appetite since then related to acute illness ("just not feeling well"). Pt reports tolerating clear liquids and consuming 100% of meals.   Pt reports UBW is over 400#. He admits that he has tried to lose weight many times in the past, but has been inconsistent with his efforts. However, wt loss not consistent with wt hx. Pt was very forthcoming about his noncompliance with diabetes self-management. He has not taken medications for approximately one year (previously on Metformin for 4 years). He shares that he has put his health on the backburner, secondary  to caring for his wife with cancer. He expresses interest in taking better care of himself after discharge and requests more information on diet for DM and weight loss. Provided "Carbohydrate Counting for People with Type 2 Diabetes", "Diabetes Label Reading Tips", and "Sodium Free Flavoring Tips" from AND's Nutrition Care Manual.   Nutrition-Focused physical exam completed. Findings are no fat depletion, no muscle depletion, and mild edema.    Labs reviewed: CBGS: 269-336.   Diet Order:  Diet bariatric clear liquid Room service appropriate? Yes; Fluid consistency: Thin  Skin:  Wound (see comment) (lt groin abcess)  Last BM:  04/18/16  Height:   Ht Readings from Last 1 Encounters:  04/19/16 6\' 1"  (1.854 m)    Weight:   Wt Readings from Last 1 Encounters:  04/19/16 (!) 372 lb 5.7 oz (168.9 kg)    Ideal Body Weight:  86.4 kg  BMI:  Body mass index is 49.13 kg/m.  Estimated Nutritional Needs:   Kcal:  2200-2400  Protein:  115-130 grams  Fluid:  >2.2 L  EDUCATION NEEDS:   Education needs addressed  Kynsleigh Westendorf A. Jimmye Norman, RD, LDN, CDE Pager: [phone] After hours Pager: [phone]

## 2016-04-20 NOTE — Anesthesia Postprocedure Evaluation (Signed)
Anesthesia Post Note  Patient: Kevin Goodman  Procedure(s) Performed: Procedure(s) (LRB): LEFT THIGH DEBRIDEMENT (Left)  Patient location during evaluation: PACU Anesthesia Type: General Level of consciousness: awake and alert Pain management: pain level controlled Vital Signs Assessment: post-procedure vital signs reviewed and stable Respiratory status: spontaneous breathing, nonlabored ventilation and respiratory function stable Cardiovascular status: blood pressure returned to baseline and stable Postop Assessment: no signs of nausea or vomiting Anesthetic complications: no       Last Vitals:  Vitals:   04/20/16 0855 04/20/16 1507  BP: 126/77 125/75  Pulse: 89 86  Resp:  16  Temp:  37 C    Last Pain:  Vitals:   04/20/16 1507  TempSrc: Oral  PainSc:                  Lilith Solana,W. EDMOND

## 2016-04-20 NOTE — Progress Notes (Signed)
0429 CBG 270.  NP Baltazar Najjar notified.

## 2016-04-20 NOTE — Anesthesia Procedure Notes (Signed)
Procedure Name: Intubation Date/Time: 04/20/2016 1:44 AM Performed by: Valetta Fuller Pre-anesthesia Checklist: Patient identified, Emergency Drugs available, Suction available and Patient being monitored Patient Re-evaluated:Patient Re-evaluated prior to inductionOxygen Delivery Method: Circle system utilized Preoxygenation: Pre-oxygenation with 100% oxygen Intubation Type: IV induction Ventilation: Mask ventilation without difficulty Laryngoscope Size: Miller and 2 Tube type: Oral Tube size: 7.5 mm Number of attempts: 1 Airway Equipment and Method: Stylet Placement Confirmation: ETT inserted through vocal cords under direct vision,  positive ETCO2 and breath sounds checked- equal and bilateral Secured at: 23 cm Tube secured with: Tape Dental Injury: Teeth and Oropharynx as per pre-operative assessment

## 2016-04-20 NOTE — Transfer of Care (Signed)
Immediate Anesthesia Transfer of Care Note  Patient: Kevin Goodman  Procedure(s) Performed: Procedure(s): LEFT THIGH DEBRIDEMENT (Left)  Patient Location: PACU  Anesthesia Type:General  Level of Consciousness: awake, alert  and oriented  Airway & Oxygen Therapy: Patient connected to nasal cannula oxygen  Post-op Assessment: Report given to RN, Post -op Vital signs reviewed and stable and Patient moving all extremities X 4  Post vital signs: Reviewed and stable  Last Vitals:  Vitals:   04/20/16 0100 04/20/16 0251  BP: 138/71   Pulse: 95   Resp: 20   Temp: 37.4 C (P) 36.5 C    Last Pain:  Vitals:   04/20/16 0100  TempSrc: Oral  PainSc:          Complications: No apparent anesthesia complications

## 2016-04-20 NOTE — Progress Notes (Addendum)
PROGRESS NOTE    DONIVIN KOSTERMAN  [id number] DOB: Nov 15, 1950 DOA: 04/19/2016 PCP: Maricela Curet, MD  Brief Narrative:Benard L Ardis is a 66 y.o. male with medical history significant of HTN, DM, afib, gout, and morbid obesity presenting with left groin infection that started this weekend.  He thought he had a little boil in his left upper thigh/groin region and it started getting bigger and bigger.  It has been "draining a whole lot of pus and red blood and jelly-looking stuff. Went to [facility] ER and was transferred to [facility] for surgical debridement.  Assessment & Plan:   Sepsis due to Necrotizing fascitis of L thigh -s/p extensive excisional debridement this am -Continue Vanc/Aztreobnam -FU OR cultures -CCS following, wound care  Uncontrolled DM -uncontrolled, Hba1c pending,  -only on metformin at home, added lantus, will increase dose -SSI  Acute renal failure -Creatinine 2.06, prior 1.21 -Related to dehydration in the setting of above infection -IVF, monitor, caution with Vanc dosing  Morbid obesity -needs lifestyle modification  Cough/Bronchitis -afebrile, Negative CXR -Supportive care, do not suspect Flu  H/o afib -Currently in NSR on telemetry, EKG, and exam -Not on anticoagulation at this time, was previously on warfarin, stopped by PCP many years ago according to patient, no h/o bleeding -chadsvasc is 2 -if we detect any Afib this admission will start NOAC otherwise will defer to PCP  DVT prophylaxis:  add heparin Code Status: Full Code Family Communication: None present Disposition Plan:  Home once clinically improved  Consultants:   CCS   Procedures:  2/13 excisional debridement of left thigh, 18cm x 9cm area involving full thickness skin and soft tissue   Antimicrobials: Vanc/Aztrenam   Subjective: Feels better, back from OR after debridement  Objective: Vitals:   04/20/16 0344 04/20/16 0557 04/20/16 0557 04/20/16 0855  BP: 118/67  110/73 110/73 126/77  Pulse: 93 87 87 89  Resp: (!) 22 18 18    Temp: 97.9 F (36.6 C) 97.7 F (36.5 C) 97.7 F (36.5 C)   TempSrc: Oral Oral Oral   SpO2: 98% 99% 99%   Weight:      Height:        Intake/Output Summary (Last 24 hours) at 04/20/16 1123 Last data filed at 04/20/16 [id number]  Gross per 24 hour  Intake             4025 ml  Output              850 ml  Net             3175 ml   Filed Weights   04/19/16 1200 04/19/16 2019  Weight: (!) 163.7 kg (361 lb) (!) 168.9 kg (372 lb 5.7 oz)    Examination:  General exam: Obese male, no distress, AAOx3 Respiratory system: Clear to auscultation. Respiratory effort normal. Cardiovascular system: S1 & S2 heard, RRR. No JVD, murmurs Gastrointestinal system: Abdomen is nondistended, soft and nontender. Normal bowel sounds heard. Central nervous system: Alert and oriented. No focal neurological deficits. Extremities: Symmetric 5 x 5 power. Skin: No rashes, lesions or ulcers Psychiatry: Judgement and insight appear normal. Mood & affect appropriate.     Data Reviewed: I have personally reviewed following labs and imaging studies  CBC:  Recent Labs Lab 04/19/16 1153 04/20/16 0208  WBC 20.3*  --   NEUTROABS 15.2*  --   HGB 13.9 12.6*  HCT 40.5 37.0*  MCV 90.6  --   PLT 280  --    Basic Metabolic  Panel:  Recent Labs Lab 04/19/16 1153 04/20/16 0208  NA 126* 135  K 3.7 4.1  CL 92*  --   CO2 19*  --   GLUCOSE 673* 337*  BUN 28*  --   CREATININE 2.06*  --   CALCIUM 8.6*  --    GFR: Estimated Creatinine Clearance: 60 mL/min (by C-G formula based on SCr of 2.06 mg/dL (H)). Liver Function Tests:  Recent Labs Lab 04/19/16 1153  AST 28  ALT 25  ALKPHOS 60  BILITOT 0.8  PROT 7.3  ALBUMIN 2.9*   No results for input(s): LIPASE, AMYLASE in the last 168 hours. No results for input(s): AMMONIA in the last 168 hours. Coagulation Profile:  Recent Labs Lab 04/19/16 1153  INR 1.21   Cardiac Enzymes: No results  for input(s): CKTOTAL, CKMB, CKMBINDEX, TROPONINI in the last 168 hours. BNP (last 3 results) No results for input(s): PROBNP in the last 8760 hours. HbA1C: No results for input(s): HGBA1C in the last 72 hours. CBG:  Recent Labs Lab 04/20/16 0058 04/20/16 0252 04/20/16 0429 04/20/16 0553 04/20/16 0806  GLUCAP 282* 336* 270* 279* 269*   Lipid Profile: No results for input(s): CHOL, HDL, LDLCALC, TRIG, CHOLHDL, LDLDIRECT in the last 72 hours. Thyroid Function Tests: No results for input(s): TSH, T4TOTAL, FREET4, T3FREE, THYROIDAB in the last 72 hours. Anemia Panel: No results for input(s): VITAMINB12, FOLATE, FERRITIN, TIBC, IRON, RETICCTPCT in the last 72 hours. Urine analysis:    Component Value Date/Time   COLORURINE YELLOW 07/16/2011 [place] 07/16/2011 1407   LABSPEC 1.010 07/16/2011 1407   PHURINE 6.0 07/16/2011 1407   GLUCOSEU NEGATIVE 07/16/2011 1407   HGBUR TRACE (A) 07/16/2011 1407   BILIRUBINUR NEGATIVE 07/16/2011 1407   KETONESUR NEGATIVE 07/16/2011 1407   PROTEINUR NEGATIVE 07/16/2011 1407   UROBILINOGEN 0.2 07/16/2011 1407   NITRITE NEGATIVE 07/16/2011 1407   LEUKOCYTESUR SMALL (A) 07/16/2011 1407   Sepsis Labs: @LABRCNTIP (procalcitonin:4,lacticidven:4)  ) Recent Results (from the past 240 hour(s))  Culture, blood (routine x 2)     Status: None (Preliminary result)   Collection Time: 04/19/16 12:30 PM  Result Value Ref Range Status   Specimen Description BLOOD LEFT ARM  Final   Special Requests BOTTLES DRAWN AEROBIC AND ANAEROBIC 6 CC EACH  Final   Culture NO GROWTH < 24 HOURS  Final   Report Status PENDING  Incomplete  Culture, blood (routine x 2)     Status: None (Preliminary result)   Collection Time: 04/19/16 12:44 PM  Result Value Ref Range Status   Specimen Description BLOOD LEFT HAND  Final   Special Requests BOTTLES DRAWN AEROBIC AND ANAEROBIC 6 CC EACH  Final   Culture NO GROWTH < 24 HOURS  Final   Report Status PENDING   Incomplete  Surgical pcr screen     Status: None   Collection Time: 04/19/16 11:28 PM  Result Value Ref Range Status   MRSA, PCR NEGATIVE NEGATIVE Final   Staphylococcus aureus NEGATIVE NEGATIVE Final    Comment:        The Xpert SA Assay (FDA approved for NASAL specimens in patients over 32 years of age), is one component of a comprehensive surveillance program.  Test performance has been validated by [facility] for patients greater than or equal to 10 year old. It is not intended to diagnose infection nor to guide or monitor treatment.          Radiology Studies: Dg Chest 2 View  Result  Date: 04/19/2016 CLINICAL DATA:  large abscess on left groin. Began on scrotum and has spread to leg. History of A-FIB, DM, HTN. Pt refused to stand. EXAM: CHEST  2 VIEW COMPARISON:  07/16/2011 FINDINGS: The heart size and mediastinal contours are within normal limits. Both lungs are clear. The visualized skeletal structures are unremarkable. IMPRESSION: No active cardiopulmonary disease. Electronically Signed   By: Nolon Nations M.D.   On: 04/19/2016 18:22   Ct Pelvis W Contrast  Result Date: 04/19/2016 CLINICAL DATA:  66 year old male with left groin infection and abscess beginning 2 weeks ago. Diabetic. Initial encounter. EXAM: CT PELVIS WITH CONTRAST TECHNIQUE: Multidetector CT imaging of the pelvis was performed using the standard protocol following the bolus administration of intravenous contrast. CONTRAST:  22mL ISOVUE-300 IOPAMIDOL (ISOVUE-300) INJECTION 61% COMPARISON:  None. FINDINGS: Urinary Tract: Distal ureters and urinary bladder appear unremarkable. Kidneys not included. Bowel: Negative visible large bowel, it including a partially visible normal retrocecal appendix. Negative visible small bowel. Vascular/Lymphatic: Suboptimal intravascular contrast in part related to decreased contrast dose in the setting of renal insufficiency. Mild femoral artery calcified peripheral vascular  disease. No right inguinal or right iliac lymphadenopathy. See left side node details below. Reproductive: The scrotum itself appears to be spared except for superficial skin thickening. There is no scrotal gas. However, there is a small area of scrotal fat stranding on the right side (coronal image 36). Other: Left inguinal subcutaneous gas and confluent fat stranding beginning just below the left inguinal ligament and continuing distally into the anterior and medial thigh. Clustered gas near the skin surface on series 2, image 69. Reactive appearing left inguinal and left iliac lymph nodes, measuring individually up to 17 mm short axis. No cystic or necrotic nodes. No drainable fluid collection. Musculoskeletal: Motion artifact at both hip joints. Moderate to severe chronic degenerative changes in the visible lumbar spine. No acute osseous abnormality identified. IMPRESSION: 1. Soft tissue infection suspicious for Necrotizing Fasciitis or infection by a gas-forming organism. Inflammation with abundant subcutaneous gas extending from the left groin at the level of the inguinal ligament tracking into the anterior and medial left thigh. No associated abscess or fluid collection. 2. The scrotum appears largely spared except for skin thickening and a small area of right superior scrotal subcutaneous inflammation (series 4, image 36). 3. Reactive left inguinal and iliac station lymph nodes with no cystic or necrotic nodes. 4. No involvement of the anatomic pelvis. 5. Study discussed by telephone with Dr. Vonna Kotyk LONG on 04/19/2016 at 15:52 . Electronically Signed   By: Genevie Ann M.D.   On: 04/19/2016 15:54        Scheduled Meds: . allopurinol  300 mg Oral Daily  . aztreonam  1 g Intravenous Q8H  . clindamycin (CLEOCIN) IV  900 mg Intravenous Q8H  . insulin aspart      . insulin aspart  0-20 Units Subcutaneous TID WC  . insulin aspart  0-5 Units Subcutaneous QHS  . insulin glargine  10 Units Subcutaneous QHS    . insulin (NOVOLIN-R) infusion   Intravenous To OR  . metoprolol succinate  50 mg Oral Daily  . pneumococcal 23 valent vaccine  0.5 mL Intramuscular Tomorrow-1000  . vancomycin  1,750 mg Intravenous Q24H   Continuous Infusions: . lactated ringers 75 mL/hr at 04/20/16 0859     LOS: 1 day    Time spent: [place], MD [facility] Pager [phone]  If 7PM-7AM, please contact night-coverage [email or web address] Password  [email or web address] 04/20/2016, 11:23 AM

## 2016-04-20 NOTE — Op Note (Signed)
Preoperative diagnosis: necrotizing fasciitis  Postoperative diagnosis: same   Procedure: excisional debridement of left thigh, 18cm x 9cm area involving full thickness skin and soft tissue Surgeon: Gurney Maxin, M.D.  Asst: none  Anesthesia: general  Indications for procedure: Kevin Goodman is a 66 y.o. year old male with symptoms of left thigh swelling and fever was diagnosed with gas forming bacterial infection.  Description of procedure: The patient was brought into the operative suite. Anesthesia was administered with General endotracheal anesthesia. WHO checklist was applied. The patient was then placed in lithotomy position. The area was prepped and draped in the usual sterile fashion.  High cautery was used to make an elliptical incision around the raised area with skin changes. Further cautery was used to excise the area going deep towards the pelvic bone and anterior thigh muscles. Purulence was encountered and this was collected and sent for specimen. A piece of soft tissue 18cm x 9cm and 9cm deep was excised. In doing so there was bleeding from one area of the distal wound. A vein was identified and divided and ligated. Further inspection showed 2 areas with concern for infected tissue, one 3cm area in the high point of the wound near the hip joint and one in the distal portion of the wound. These were removed with cautery. The proximal area near the hip joint contained a large (3cm) swollen lymph node. No other signs of purulence or crepitus was identified in the remaining floor of the incision. The wound was irrigated with saline. Hemostasis was once more ensured with cautery. The wound was then packed with 3 packs of kerlix rolls moistened by saline. The area was bandaged. Patient awoke from anesthesia and was brought to pacu in stable condition.  Findings: large soft tissue infection of left thigh  Specimen: left thigh abscess for culture, left thigh soft tissue for  path  Implant: 3 rolls of Kerlix tied end to end   Blood loss: 289ml  Local anesthesia: none  Complications: none  Gurney Maxin, M.D. General, Bariatric, & Minimally Invasive Surgery [facility]

## 2016-04-20 NOTE — Anesthesia Preprocedure Evaluation (Signed)
Anesthesia Evaluation  Patient identified by MRN, date of birth, ID band Patient awake    Reviewed: Allergy & Precautions, H&P , NPO status , Patient's Chart, lab work & pertinent test results, reviewed documented beta blocker date and time   Airway Mallampati: III  TM Distance: >3 FB Neck ROM: Full    Dental no notable dental hx. (+) Teeth Intact, Dental Advisory Given   Pulmonary Current Smoker,    Pulmonary exam normal breath sounds clear to auscultation       Cardiovascular hypertension, Pt. on home beta blockers and Pt. on medications  Rhythm:Regular Rate:Tachycardia     Neuro/Psych negative neurological ROS  negative psych ROS   GI/Hepatic negative GI ROS, Neg liver ROS,   Endo/Other  diabetes, Insulin DependentMorbid obesity  Renal/GU negative Renal ROS  negative genitourinary   Musculoskeletal   Abdominal   Peds  Hematology negative hematology ROS (+)   Anesthesia Other Findings   Reproductive/Obstetrics negative OB ROS                             Anesthesia Physical Anesthesia Plan  ASA: III and emergent  Anesthesia Plan: General   Post-op Pain Management:    Induction: Intravenous, Rapid sequence and Cricoid pressure planned  Airway Management Planned: Oral ETT  Additional Equipment:   Intra-op Plan:   Post-operative Plan: Extubation in OR  Informed Consent: I have reviewed the patients History and Physical, chart, labs and discussed the procedure including the risks, benefits and alternatives for the proposed anesthesia with the patient or authorized representative who has indicated his/her understanding and acceptance.   Dental advisory given  Plan Discussed with: CRNA  Anesthesia Plan Comments:         Anesthesia Quick Evaluation

## 2016-04-20 NOTE — Progress Notes (Signed)
Inpatient Diabetes Program Recommendations  AACE/ADA: New Consensus Statement on Inpatient Glycemic Control (2015)  Target Ranges:  Prepandial:   less than 140 mg/dL      Peak postprandial:   less than 180 mg/dL (1-2 hours)      Critically ill patients:  140 - 180 mg/dL   Results for CASWELL, KALEN (MRN [id number]) as of 04/20/2016 09:54  Ref. Range 04/20/2016 00:58 04/20/2016 02:52 04/20/2016 04:29 04/20/2016 05:53 04/20/2016 08:06  Glucose-Capillary Latest Ref Range: 65 - 99 mg/dL 282 (H) 336 (H) 270 (H) 279 (H) 269 (H)   Review of Glycemic Control  Diabetes history: DM 2 Outpatient Diabetes medications: Metformin 500 BID Current orders for Inpatient glycemic control: Lantus 10 units, Novolog Resistant + HS scale  Inpatient Diabetes Program Recommendations:   Glucose still in the high 200's after Lantus 10 units given. Due to patient's weight and resistance, please consider increasing basal insulin to Lantus 20 units.   Thanks,  Tama Headings RN, MSN, [facility] Inpatient Diabetes Coordinator Team Pager [phone] (8a-5p)

## 2016-04-20 NOTE — Progress Notes (Signed)
Received report from Eleanor, Overton Mam,

## 2016-04-20 NOTE — Progress Notes (Signed)
Patient ID: Kevin Goodman, male   DOB: 09/02/1950, 66 y.o.   MRN: [id number]  [facility] Surgery Progress Note  Day of Surgery  Subjective: Feeling better this morning than prior to surgery. Pain is well controlled. Dressing has not yet been changed.  Objective: Vital signs in last 24 hours: Temp:  [97.7 F (36.5 C)-99.3 F (37.4 C)] 97.7 F (36.5 C) (02/13 0557) Pulse Rate:  [80-119] 89 (02/13 0855) Resp:  [13-31] 18 (02/13 0557) BP: (107-152)/(62-77) 126/77 (02/13 0855) SpO2:  [92 %-100 %] 99 % (02/13 0557) Weight:  [361 lb (163.7 kg)-372 lb 5.7 oz (168.9 kg)] 372 lb 5.7 oz (168.9 kg) (02/12 2019) Last BM Date: 04/18/16  Intake/Output from previous day: 02/12 0701 - 02/13 0700 In: 4025 [P.O.:100; I.V.:1575; IV Piggyback:2350] Out: 850 [Urine:600; Blood:250] Intake/Output this shift: No intake/output data recorded.  PE: Gen:  Alert, NAD, pleasant Pulm:  Effort normal Abd: Soft, NT/ND Left thigh: anterior/proximal thigh wound s/p I&D, base clean with no drainage, tissue is pink, no surrounding cellulitis     Lab Results:   Recent Labs  04/19/16 1153 04/20/16 0208  WBC 20.3*  --   HGB 13.9 12.6*  HCT 40.5 37.0*  PLT 280  --    BMET  Recent Labs  04/19/16 1153 04/20/16 0208  NA 126* 135  K 3.7 4.1  CL 92*  --   CO2 19*  --   GLUCOSE 673* 337*  BUN 28*  --   CREATININE 2.06*  --   CALCIUM 8.6*  --    PT/INR  Recent Labs  04/19/16 1153  LABPROT 15.4*  INR 1.21   CMP     Component Value Date/Time   NA 135 04/20/2016 0208   K 4.1 04/20/2016 0208   CL 92 (L) 04/19/2016 1153   CO2 19 (L) 04/19/2016 1153   GLUCOSE 337 (H) 04/20/2016 0208   BUN 28 (H) 04/19/2016 1153   CREATININE 2.06 (H) 04/19/2016 1153   CALCIUM 8.6 (L) 04/19/2016 1153   PROT 7.3 04/19/2016 1153   ALBUMIN 2.9 (L) 04/19/2016 1153   AST 28 04/19/2016 1153   ALT 25 04/19/2016 1153   ALKPHOS 60 04/19/2016 1153   BILITOT 0.8 04/19/2016 1153   GFRNONAA 33 (L) 04/19/2016  1153   GFRAA 38 (L) 04/19/2016 1153   Lipase  No results found for: LIPASE     Studies/Results: Dg Chest 2 View  Result Date: 04/19/2016 CLINICAL DATA:  large abscess on left groin. Began on scrotum and has spread to leg. History of A-FIB, DM, HTN. Pt refused to stand. EXAM: CHEST  2 VIEW COMPARISON:  07/16/2011 FINDINGS: The heart size and mediastinal contours are within normal limits. Both lungs are clear. The visualized skeletal structures are unremarkable. IMPRESSION: No active cardiopulmonary disease. Electronically Signed   By: Nolon Nations M.D.   On: 04/19/2016 18:22   Ct Pelvis W Contrast  Result Date: 04/19/2016 CLINICAL DATA:  66 year old male with left groin infection and abscess beginning 2 weeks ago. Diabetic. Initial encounter. EXAM: CT PELVIS WITH CONTRAST TECHNIQUE: Multidetector CT imaging of the pelvis was performed using the standard protocol following the bolus administration of intravenous contrast. CONTRAST:  30mL ISOVUE-300 IOPAMIDOL (ISOVUE-300) INJECTION 61% COMPARISON:  None. FINDINGS: Urinary Tract: Distal ureters and urinary bladder appear unremarkable. Kidneys not included. Bowel: Negative visible large bowel, it including a partially visible normal retrocecal appendix. Negative visible small bowel. Vascular/Lymphatic: Suboptimal intravascular contrast in part related to decreased contrast dose in the setting  of renal insufficiency. Mild femoral artery calcified peripheral vascular disease. No right inguinal or right iliac lymphadenopathy. See left side node details below. Reproductive: The scrotum itself appears to be spared except for superficial skin thickening. There is no scrotal gas. However, there is a small area of scrotal fat stranding on the right side (coronal image 36). Other: Left inguinal subcutaneous gas and confluent fat stranding beginning just below the left inguinal ligament and continuing distally into the anterior and medial thigh. Clustered gas  near the skin surface on series 2, image 69. Reactive appearing left inguinal and left iliac lymph nodes, measuring individually up to 17 mm short axis. No cystic or necrotic nodes. No drainable fluid collection. Musculoskeletal: Motion artifact at both hip joints. Moderate to severe chronic degenerative changes in the visible lumbar spine. No acute osseous abnormality identified. IMPRESSION: 1. Soft tissue infection suspicious for Necrotizing Fasciitis or infection by a gas-forming organism. Inflammation with abundant subcutaneous gas extending from the left groin at the level of the inguinal ligament tracking into the anterior and medial left thigh. No associated abscess or fluid collection. 2. The scrotum appears largely spared except for skin thickening and a small area of right superior scrotal subcutaneous inflammation (series 4, image 36). 3. Reactive left inguinal and iliac station lymph nodes with no cystic or necrotic nodes. 4. No involvement of the anatomic pelvis. 5. Study discussed by telephone with Dr. Vonna Kotyk LONG on 04/19/2016 at 15:52 . Electronically Signed   By: Genevie Ann M.D.   On: 04/19/2016 15:54    Anti-infectives: Anti-infectives    Start     Dose/Rate Route Frequency Ordered Stop   04/20/16 1300  vancomycin (VANCOCIN) 1,750 mg in sodium chloride 0.9 % 500 mL IVPB     1,750 mg 250 mL/hr over 120 Minutes Intravenous Every 24 hours 04/19/16 1713     04/19/16 2100  aztreonam (AZACTAM) 1 g in dextrose 5 % 50 mL IVPB     1 g 100 mL/hr over 30 Minutes Intravenous Every 8 hours 04/19/16 1713     04/19/16 2000  clindamycin (CLEOCIN) IVPB 900 mg     900 mg 100 mL/hr over 30 Minutes Intravenous Every 8 hours 04/19/16 1911     04/19/16 1300  metroNIDAZOLE (FLAGYL) IVPB 500 mg  Status:  Discontinued     500 mg 100 mL/hr over 60 Minutes Intravenous Every 8 hours 04/19/16 1253 04/19/16 1944   04/19/16 1300  vancomycin (VANCOCIN) 2,000 mg in sodium chloride 0.9 % 500 mL IVPB     2,000 mg 250  mL/hr over 120 Minutes Intravenous  Once 04/19/16 1253 04/19/16 1533   04/19/16 1245  aztreonam (AZACTAM) 2 g in dextrose 5 % 50 mL IVPB     2 g 100 mL/hr over 30 Minutes Intravenous  Once 04/19/16 1239 04/19/16 1324   04/19/16 1245  vancomycin (VANCOCIN) IVPB 1000 mg/200 mL premix  Status:  Discontinued     1,000 mg 200 mL/hr over 60 Minutes Intravenous  Once 04/19/16 1239 04/19/16 1354       Assessment/Plan Necrotizing fasciitis Excisional debridement of left thigh, 18cm x 9cm area involving full thickness skin and soft tissue 2/13 Luke Kinsinger - POD 0 - culture pending  Poorly controlled DM H/o A fib  ID - clinda 12/12>>, vanco 12/12>>, azactam 12/12>> FEN - bariatric clear liquids, advance as tolerated  Plan - Dressing changed. Continue BID wet to dry dressing changes. Encourage daily shower with wound open. Continue broad spectrum antibiotics and  follow culture. I will go ahead and consult home health for wound care.   LOS: 1 day    Jerrye Beavers , [facility] Surgery 04/20/2016, 11:17 AM Pager: [phone] Consults: [phone] Mon-Fri 7:00 am-4:30 pm Sat-Sun 7:00 am-11:30 am

## 2016-04-21 ENCOUNTER — Encounter (HOSPITAL_COMMUNITY): Payer: Self-pay | Admitting: General Surgery

## 2016-04-21 LAB — BASIC METABOLIC PANEL
ANION GAP: 10 (ref 5–15)
BUN: 15 mg/dL (ref 6–20)
CHLORIDE: 101 mmol/L (ref 101–111)
CO2: 24 mmol/L (ref 22–32)
Calcium: 7.9 mg/dL — ABNORMAL LOW (ref 8.9–10.3)
Creatinine, Ser: 1.19 mg/dL (ref 0.61–1.24)
GFR calc Af Amer: >60 mL/min (ref >60)
Glucose, Bld: 236 mg/dL — ABNORMAL HIGH (ref 65–99)
POTASSIUM: 3.7 mmol/L (ref 3.5–5.1)
SODIUM: 135 mmol/L (ref 135–145)

## 2016-04-21 LAB — GLUCOSE, CAPILLARY
GLUCOSE-CAPILLARY: 290 mg/dL — AB (ref 65–99)
Glucose-Capillary: 259 mg/dL — ABNORMAL HIGH (ref 65–99)
Glucose-Capillary: 218 mg/dL — ABNORMAL HIGH (ref 65–99)
Glucose-Capillary: 169 mg/dL — ABNORMAL HIGH (ref 65–99)

## 2016-04-21 LAB — CBC
HEMATOCRIT: 33.7 % — AB (ref 39.0–52.0)
HEMOGLOBIN: 11.1 g/dL — AB (ref 13.0–17.0)
MCH: 30 pg (ref 26.0–34.0)
MCHC: 32.9 g/dL (ref 30.0–36.0)
MCV: 91.1 fL (ref 78.0–100.0)
Platelets: 260 10*3/uL (ref 150–400)
RBC: 3.7 MIL/uL — AB (ref 4.22–5.81)
RDW: 14.5 % (ref 11.5–15.5)
WBC: 17.8 10*3/uL — AB (ref 4.0–10.5)

## 2016-04-21 LAB — HEMOGLOBIN A1C
Hgb A1c MFr Bld: 14.3 % — ABNORMAL HIGH (ref 4.8–5.6)
Mean Plasma Glucose: 364 mg/dL

## 2016-04-21 MED ORDER — INSULIN GLARGINE 100 UNIT/ML ~~LOC~~ SOLN
30.0000 [IU] | Freq: Every day | SUBCUTANEOUS | Status: DC
  Administered 2016-04-21 – 2016-04-24 (×4): 30 [IU] via SUBCUTANEOUS
  Filled 2016-04-21 (×5): qty 0.3

## 2016-04-21 MED ORDER — GUAIFENESIN-DM 100-10 MG/5ML PO SYRP
5.0000 mL | ORAL_SOLUTION | ORAL | Status: DC | PRN
  Administered 2016-04-21 – 2016-04-24 (×5): 5 mL via ORAL
  Filled 2016-04-21 (×5): qty 5

## 2016-04-21 NOTE — Progress Notes (Addendum)
Patient ID: Kevin Goodman, male   DOB: August 21, 1950, 66 y.o.   MRN: [id number]  [facility] Surgery Progress Note  1 Day Post-Op  Subjective: No complaints this morning. Tolerating dressing changes well.  Objective: Vital signs in last 24 hours: Temp:  [98.6 F (37 C)-99.7 F (37.6 C)] 99.3 F (37.4 C) (02/14 0557) Pulse Rate:  [86-92] 89 (02/14 0557) Resp:  [16-17] 17 (02/14 0557) BP: (125-143)/(68-78) 143/78 (02/14 0557) SpO2:  [98 %-99 %] 99 % (02/14 0557) Last BM Date: 04/18/16  Intake/Output from previous day: 02/13 0701 - 02/14 0700 In: 2087.5 [I.V.:1287.5; IV Piggyback:800] Out: 500 [Urine:500] Intake/Output this shift: No intake/output data recorded.  PE: Gen:  Alert, NAD, pleasant Pulm:  Effort normal Abd: Soft, NT/ND Left thigh: anterior/proximal thigh wound s/p I&D, base clean with no drainage, tissue is pink, no surrounding cellulitis     Lab Results:   Recent Labs  04/20/16 1151 04/21/16 0608  WBC 18.2* 17.8*  HGB 11.3* 11.1*  HCT 34.1* 33.7*  PLT 251 260   BMET  Recent Labs  04/19/16 1153 04/20/16 0208 04/20/16 1151  NA 126* 135 135  K 3.7 4.1 3.9  CL 92*  --  102  CO2 19*  --  24  GLUCOSE 673* 337* 282*  BUN 28*  --  21*  CREATININE 2.06*  --  1.29*  CALCIUM 8.6*  --  8.4*   PT/INR  Recent Labs  04/19/16 1153  LABPROT 15.4*  INR 1.21   CMP     Component Value Date/Time   NA 135 04/20/2016 1151   K 3.9 04/20/2016 1151   CL 102 04/20/2016 1151   CO2 24 04/20/2016 1151   GLUCOSE 282 (H) 04/20/2016 1151   BUN 21 (H) 04/20/2016 1151   CREATININE 1.29 (H) 04/20/2016 1151   CALCIUM 8.4 (L) 04/20/2016 1151   PROT 7.3 04/19/2016 1153   ALBUMIN 2.9 (L) 04/19/2016 1153   AST 28 04/19/2016 1153   ALT 25 04/19/2016 1153   ALKPHOS 60 04/19/2016 1153   BILITOT 0.8 04/19/2016 1153   GFRNONAA 57 (L) 04/20/2016 1151   GFRAA >60 04/20/2016 1151   Lipase  No results found for: LIPASE     Studies/Results: Dg Chest 2  View  Result Date: 04/19/2016 CLINICAL DATA:  large abscess on left groin. Began on scrotum and has spread to leg. History of A-FIB, DM, HTN. Pt refused to stand. EXAM: CHEST  2 VIEW COMPARISON:  07/16/2011 FINDINGS: The heart size and mediastinal contours are within normal limits. Both lungs are clear. The visualized skeletal structures are unremarkable. IMPRESSION: No active cardiopulmonary disease. Electronically Signed   By: Nolon Nations M.D.   On: 04/19/2016 18:22   Ct Pelvis W Contrast  Result Date: 04/19/2016 CLINICAL DATA:  66 year old male with left groin infection and abscess beginning 2 weeks ago. Diabetic. Initial encounter. EXAM: CT PELVIS WITH CONTRAST TECHNIQUE: Multidetector CT imaging of the pelvis was performed using the standard protocol following the bolus administration of intravenous contrast. CONTRAST:  33mL ISOVUE-300 IOPAMIDOL (ISOVUE-300) INJECTION 61% COMPARISON:  None. FINDINGS: Urinary Tract: Distal ureters and urinary bladder appear unremarkable. Kidneys not included. Bowel: Negative visible large bowel, it including a partially visible normal retrocecal appendix. Negative visible small bowel. Vascular/Lymphatic: Suboptimal intravascular contrast in part related to decreased contrast dose in the setting of renal insufficiency. Mild femoral artery calcified peripheral vascular disease. No right inguinal or right iliac lymphadenopathy. See left side node details below. Reproductive: The scrotum itself appears  to be spared except for superficial skin thickening. There is no scrotal gas. However, there is a small area of scrotal fat stranding on the right side (coronal image 36). Other: Left inguinal subcutaneous gas and confluent fat stranding beginning just below the left inguinal ligament and continuing distally into the anterior and medial thigh. Clustered gas near the skin surface on series 2, image 69. Reactive appearing left inguinal and left iliac lymph nodes, measuring  individually up to 17 mm short axis. No cystic or necrotic nodes. No drainable fluid collection. Musculoskeletal: Motion artifact at both hip joints. Moderate to severe chronic degenerative changes in the visible lumbar spine. No acute osseous abnormality identified. IMPRESSION: 1. Soft tissue infection suspicious for Necrotizing Fasciitis or infection by a gas-forming organism. Inflammation with abundant subcutaneous gas extending from the left groin at the level of the inguinal ligament tracking into the anterior and medial left thigh. No associated abscess or fluid collection. 2. The scrotum appears largely spared except for skin thickening and a small area of right superior scrotal subcutaneous inflammation (series 4, image 36). 3. Reactive left inguinal and iliac station lymph nodes with no cystic or necrotic nodes. 4. No involvement of the anatomic pelvis. 5. Study discussed by telephone with Dr. Vonna Kotyk LONG on 04/19/2016 at 15:52 . Electronically Signed   By: Genevie Ann M.D.   On: 04/19/2016 15:54    Anti-infectives: Anti-infectives    Start     Dose/Rate Route Frequency Ordered Stop   04/20/16 1300  vancomycin (VANCOCIN) 1,750 mg in sodium chloride 0.9 % 500 mL IVPB     1,750 mg 250 mL/hr over 120 Minutes Intravenous Every 24 hours 04/19/16 1713     04/19/16 2100  aztreonam (AZACTAM) 1 g in dextrose 5 % 50 mL IVPB     1 g 100 mL/hr over 30 Minutes Intravenous Every 8 hours 04/19/16 1713     04/19/16 2000  clindamycin (CLEOCIN) IVPB 900 mg     900 mg 100 mL/hr over 30 Minutes Intravenous Every 8 hours 04/19/16 1911     04/19/16 1300  metroNIDAZOLE (FLAGYL) IVPB 500 mg  Status:  Discontinued     500 mg 100 mL/hr over 60 Minutes Intravenous Every 8 hours 04/19/16 1253 04/19/16 1944   04/19/16 1300  vancomycin (VANCOCIN) 2,000 mg in sodium chloride 0.9 % 500 mL IVPB     2,000 mg 250 mL/hr over 120 Minutes Intravenous  Once 04/19/16 1253 04/19/16 1533   04/19/16 1245  aztreonam (AZACTAM) 2 g in  dextrose 5 % 50 mL IVPB     2 g 100 mL/hr over 30 Minutes Intravenous  Once 04/19/16 1239 04/19/16 1324   04/19/16 1245  vancomycin (VANCOCIN) IVPB 1000 mg/200 mL premix  Status:  Discontinued     1,000 mg 200 mL/hr over 60 Minutes Intravenous  Once 04/19/16 1239 04/19/16 1354       Assessment/Plan Necrotizing fasciitis Excisional debridement of left thigh, 18cm x 9cm area involving full thickness skin and soft tissue 2/13 Luke Kinsinger - POD 1 - gram stain: MODERATE GRAM POSITIVE COCCI, FEW GRAM NEGATIVE RODS - culture pending - WBC trending down 17.8. TMAX 99.7  Poorly controlled DM H/o A fib  ID - clinda 12/12>>, vanco 12/12>>, azactam 12/12>> FEN - bariatric full liquids, advance as tolerated  Plan - Wound stable. Continue BID wet to dry dressing changes. Encourage daily shower with wound open. Continue broad spectrum antibiotics and follow culture.    LOS: 2 days  Jerrye Beavers , [facility] Surgery 04/21/2016, 7:46 AM Pager: [phone] Consults: [phone] Mon-Fri 7:00 am-4:30 pm Sat-Sun 7:00 am-11:30 am  Patient examined and I agree with the assessment and plan  Georganna Skeans, MD, MPH, FACS Trauma: [phone] General Surgery: [phone]  04/21/2016 2:08 PM

## 2016-04-21 NOTE — Progress Notes (Signed)
Inpatient Diabetes Program Recommendations  AACE/ADA: New Consensus Statement on Inpatient Glycemic Control (2015)  Target Ranges:  Prepandial:   less than 140 mg/dL      Peak postprandial:   less than 180 mg/dL (1-2 hours)      Critically ill patients:  140 - 180 mg/dL   Review of Glycemic Control  Diabetes history: DM 2 Outpatient Diabetes medications: Metformin 500 BID Current orders for Inpatient glycemic control: Lantus 25 units, Novolog Resistant + HS scale  Inpatient Diabetes Program Recommendations:   Fasting glucose 236 in labs. Please consider increasing Lantus to 32-34 units (0.2units/kg) QHS.  Thanks,  Tama Headings RN, MSN, [facility] Inpatient Diabetes Coordinator Team Pager [phone] (8a-5p)

## 2016-04-21 NOTE — Progress Notes (Signed)
PROGRESS NOTE        PATIENT DETAILS Name: Kevin Goodman Age: 66 y.o. Sex: male Date of Birth: Aug 07, 1950 Admit Date: 04/19/2016 Admitting Physician Karmen Bongo, MD [id number] M, MD  Brief Narrative: Patient is a 66 y.o. male with history of morbid obesity, hypertension, diabetes, atrial fibrillation not on anticoagulation who presented with necrotizing infection of his left groin/left thigh area. He was seen by general surgery and underwent incision and drainage. See below for further details.  Subjective: Lying comfortably in bed-some pain at the left groin site-but no chest pain or shortness of breath.  Assessment/Plan: Sepsis secondary to necrotizing fasciitis of left thigh/left groin area: Sepsis pathophysiology has resolved-patient is currently status post extensive excisional debridement on 2/13. He was maintained on empiric vancomycin/clindamycin/Azactam-wound cultures positive for gram-positive cocci and gram-negative rods. Since clinically improved, will go ahead and discontinue clindamycin but maintain on vancomycin/Azactam till further culture data is obtained. Gen. surgery following an directing wound care.  Uncontrolled type 2 diabetes with hyperglycemia: Await A1c-suspect patient will require insulin on discharge. CBGs although better-are still elevated-increase Lantus to 30 units, continue SSI. Plans are to resume oral hypoglycemic agents on discharge. Have instructed RN to begin insulin training, I have consulted diabetic coordinator for further counseling/education.  Hypertension: Continue metoprolol-blood pressure currently controlled.  Acute renal failure: Likely hemodynamically mediated in a setting of sepsis. Resolved with IV fluids and other supportive measures.  History of paroxysmal atrial fibrillation: Maintaining sinus rhythm-chadsvasc is 2. Per patient, previously he was on anticoagulation with warfarin, apparently this was  discontinued many years ago his PCP. Start aspirin, discussion currently in progress regarding initiation of anticoagulation-patient will think about it and let us know-alternatively we could ask him to follow-up with his PCP for further continued care.  Morbid obesity: Counseled regarding importance of weight loss.  DVT Prophylaxis: Prophylactic Heparin   Code Status: Full code   Family Communication: None at bedside  Disposition Plan: Remain inpatient-but will plan on Home health  on discharge-probably in 1-2 days  Antimicrobial agents: Anti-infectives    Start     Dose/Rate Route Frequency Ordered Stop   04/20/16 1300  vancomycin (VANCOCIN) 1,750 mg in sodium chloride 0.9 % 500 mL IVPB     1,750 mg 250 mL/hr over 120 Minutes Intravenous Every 24 hours 04/19/16 1713     04/19/16 2100  aztreonam (AZACTAM) 1 g in dextrose 5 % 50 mL IVPB     1 g 100 mL/hr over 30 Minutes Intravenous Every 8 hours 04/19/16 1713     04/19/16 2000  clindamycin (CLEOCIN) IVPB 900 mg  Status:  Discontinued     900 mg 100 mL/hr over 30 Minutes Intravenous Every 8 hours 04/19/16 1911 04/21/16 0843   04/19/16 1300  metroNIDAZOLE (FLAGYL) IVPB 500 mg  Status:  Discontinued     500 mg 100 mL/hr over 60 Minutes Intravenous Every 8 hours 04/19/16 1253 04/19/16 1944   04/19/16 1300  vancomycin (VANCOCIN) 2,000 mg in sodium chloride 0.9 % 500 mL IVPB     2,000 mg 250 mL/hr over 120 Minutes Intravenous  Once 04/19/16 1253 04/19/16 1533   04/19/16 1245  aztreonam (AZACTAM) 2 g in dextrose 5 % 50 mL IVPB     2 g 100 mL/hr over 30 Minutes Intravenous  Once 04/19/16 1239 04/19/16 1324   04/19/16 1245  vancomycin (  VANCOCIN) IVPB 1000 mg/200 mL premix  Status:  Discontinued     1,000 mg 200 mL/hr over 60 Minutes Intravenous  Once 04/19/16 1239 04/19/16 1354      Procedures: 2/13 excisional debridement of left thigh, 18cm x 9cm area involving full thickness skin and soft tissue  CONSULTS:  general  surgery  Time spent: 25 minutes-Greater than 50% of this time was spent in counseling, explanation of diagnosis, planning of further management, and coordination of care.  MEDICATIONS: Scheduled Meds: . allopurinol  300 mg Oral Daily  . aztreonam  1 g Intravenous Q8H  . feeding supplement (PRO-STAT SUGAR FREE 64)  30 mL Oral BID  . heparin subcutaneous  5,000 Units Subcutaneous Q8H  . insulin aspart  0-20 Units Subcutaneous TID WC  . insulin aspart  0-5 Units Subcutaneous QHS  . insulin glargine  25 Units Subcutaneous QHS  . metoprolol succinate  50 mg Oral Daily  . multivitamin with minerals  1 tablet Oral Daily  . vancomycin  1,750 mg Intravenous Q24H   Continuous Infusions: PRN Meds:.acetaminophen **OR** acetaminophen, albuterol, HYDROcodone-acetaminophen, morphine injection, ondansetron **OR** ondansetron (ZOFRAN) IV   PHYSICAL EXAM: Vital signs: Vitals:   04/20/16 1507 04/20/16 2301 04/21/16 0557 04/21/16 0930  BP: 125/75 130/68 (!) 143/78 (!) 144/70  Pulse: 86 92 89 93  Resp: 16 17 17    Temp: 98.6 F (37 C) 99.7 F (37.6 C) 99.3 F (37.4 C)   TempSrc: Oral Oral Oral   SpO2: 98% 99% 99%   Weight:      Height:       Filed Weights   04/19/16 1200 04/19/16 2019  Weight: (!) 163.7 kg (361 lb) (!) 168.9 kg (372 lb 5.7 oz)   Body mass index is 49.13 kg/m.   General appearance :Awake, alert, not in any distress.  Eyes:, pupils equally reactive to light and accomodation,no scleral icterus.Pink conjunctiva HEENT: Atraumatic and Normocephalic Neck: supple, no JVD. No cervical lymphadenopathy. No thyromegaly Resp:Good air entry bilaterally, no added sounds  CVS: S1 S2 regular, no murmurs.  GI: Bowel sounds present, Non tender and not distended with no gaurding, rigidity or rebound.No organomegaly Extremities: B/L Lower Ext shows no edema, both legs are warm to touch.Dressing present in the left eye-did not open. Neurology:  speech clear,Non focal, sensation is grossly  intact. Psychiatric: Normal judgment and insight. Alert and oriented x 3. Normal mood. Musculoskeletal:No digital cyanosis Skin:No Rash, warm and dry Wounds:N/A  I have personally reviewed following labs and imaging studies  LABORATORY DATA: CBC:  Recent Labs Lab 04/19/16 1153 04/20/16 0208 04/20/16 1151 04/21/16 0608  WBC 20.3*  --  18.2* 17.8*  NEUTROABS 15.2*  --   --   --   HGB 13.9 12.6* 11.3* 11.1*  HCT 40.5 37.0* 34.1* 33.7*  MCV 90.6  --  90.7 91.1  PLT 280  --  251 [id number]    Basic Metabolic Panel:  Recent Labs Lab 04/19/16 1153 04/20/16 0208 04/20/16 1151 04/21/16 0608  NA 126* 135 135 135  K 3.7 4.1 3.9 3.7  CL 92*  --  102 101  CO2 19*  --  24 24  GLUCOSE 673* 337* 282* 236*  BUN 28*  --  21* 15  CREATININE 2.06*  --  1.29* 1.19  CALCIUM 8.6*  --  8.4* 7.9*    GFR: Estimated Creatinine Clearance: 103.8 mL/min (by C-G formula based on SCr of 1.19 mg/dL).  Liver Function Tests:  Recent Labs Lab 04/19/16 1153  AST  28  ALT 25  ALKPHOS 60  BILITOT 0.8  PROT 7.3  ALBUMIN 2.9*   No results for input(s): LIPASE, AMYLASE in the last 168 hours. No results for input(s): AMMONIA in the last 168 hours.  Coagulation Profile:  Recent Labs Lab 04/19/16 1153  INR 1.21    Cardiac Enzymes: No results for input(s): CKTOTAL, CKMB, CKMBINDEX, TROPONINI in the last 168 hours.  BNP (last 3 results) No results for input(s): PROBNP in the last 8760 hours.  HbA1C: No results for input(s): HGBA1C in the last 72 hours.  CBG:  Recent Labs Lab 04/20/16 0806 04/20/16 1222 04/20/16 1705 04/20/16 2202 04/21/16 0917  GLUCAP 269* 241* 276* 198* 259*    Lipid Profile: No results for input(s): CHOL, HDL, LDLCALC, TRIG, CHOLHDL, LDLDIRECT in the last 72 hours.  Thyroid Function Tests: No results for input(s): TSH, T4TOTAL, FREET4, T3FREE, THYROIDAB in the last 72 hours.  Anemia Panel: No results for input(s): VITAMINB12, FOLATE, FERRITIN, TIBC, IRON,  RETICCTPCT in the last 72 hours.  Urine analysis:    Component Value Date/Time   COLORURINE YELLOW 07/16/2011 Bray 07/16/2011 1407   LABSPEC 1.010 07/16/2011 1407   PHURINE 6.0 07/16/2011 1407   GLUCOSEU NEGATIVE 07/16/2011 1407   HGBUR TRACE (A) 07/16/2011 1407   BILIRUBINUR NEGATIVE 07/16/2011 1407   KETONESUR NEGATIVE 07/16/2011 1407   PROTEINUR NEGATIVE 07/16/2011 1407   UROBILINOGEN 0.2 07/16/2011 1407   NITRITE NEGATIVE 07/16/2011 1407   LEUKOCYTESUR SMALL (A) 07/16/2011 1407    Sepsis Labs: Lactic Acid, Venous    Component Value Date/Time   LATICACIDVEN 1.7 04/19/2016 2333    MICROBIOLOGY: Recent Results (from the past 240 hour(s))  Culture, blood (routine x 2)     Status: None (Preliminary result)   Collection Time: 04/19/16 12:30 PM  Result Value Ref Range Status   Specimen Description BLOOD LEFT ARM  Final   Special Requests BOTTLES DRAWN AEROBIC AND ANAEROBIC 6 CC EACH  Final   Culture NO GROWTH 2 DAYS  Final   Report Status PENDING  Incomplete  Culture, blood (routine x 2)     Status: None (Preliminary result)   Collection Time: 04/19/16 12:44 PM  Result Value Ref Range Status   Specimen Description BLOOD LEFT HAND  Final   Special Requests BOTTLES DRAWN AEROBIC AND ANAEROBIC 6 CC EACH  Final   Culture NO GROWTH 2 DAYS  Final   Report Status PENDING  Incomplete  Surgical pcr screen     Status: None   Collection Time: 04/19/16 11:28 PM  Result Value Ref Range Status   MRSA, PCR NEGATIVE NEGATIVE Final   Staphylococcus aureus NEGATIVE NEGATIVE Final    Comment:        The Xpert SA Assay (FDA approved for NASAL specimens in patients over 24 years of age), is one component of a comprehensive surveillance program.  Test performance has been validated by [facility] for patients greater than or equal to 16 year old. It is not intended to diagnose infection nor to guide or monitor treatment.   Aerobic/Anaerobic Culture  (surgical/deep wound)     Status: None (Preliminary result)   Collection Time: 04/20/16  2:07 AM  Result Value Ref Range Status   Specimen Description ABSCESS  Final   Special Requests LEFT THIGH DEBRIDEMENT  Final   Gram Stain   Final    FEW WBC PRESENT,BOTH PMN AND MONONUCLEAR MODERATE GRAM POSITIVE COCCI IN PAIRS FEW GRAM NEGATIVE RODS  Culture PENDING  Incomplete   Report Status PENDING  Incomplete    RADIOLOGY STUDIES/RESULTS: Dg Chest 2 View  Result Date: 04/19/2016 CLINICAL DATA:  large abscess on left groin. Began on scrotum and has spread to leg. History of A-FIB, DM, HTN. Pt refused to stand. EXAM: CHEST  2 VIEW COMPARISON:  07/16/2011 FINDINGS: The heart size and mediastinal contours are within normal limits. Both lungs are clear. The visualized skeletal structures are unremarkable. IMPRESSION: No active cardiopulmonary disease. Electronically Signed   By: Nolon Nations M.D.   On: 04/19/2016 18:22   Ct Pelvis W Contrast  Result Date: 04/19/2016 CLINICAL DATA:  66 year old male with left groin infection and abscess beginning 2 weeks ago. Diabetic. Initial encounter. EXAM: CT PELVIS WITH CONTRAST TECHNIQUE: Multidetector CT imaging of the pelvis was performed using the standard protocol following the bolus administration of intravenous contrast. CONTRAST:  71mL ISOVUE-300 IOPAMIDOL (ISOVUE-300) INJECTION 61% COMPARISON:  None. FINDINGS: Urinary Tract: Distal ureters and urinary bladder appear unremarkable. Kidneys not included. Bowel: Negative visible large bowel, it including a partially visible normal retrocecal appendix. Negative visible small bowel. Vascular/Lymphatic: Suboptimal intravascular contrast in part related to decreased contrast dose in the setting of renal insufficiency. Mild femoral artery calcified peripheral vascular disease. No right inguinal or right iliac lymphadenopathy. See left side node details below. Reproductive: The scrotum itself appears to be spared  except for superficial skin thickening. There is no scrotal gas. However, there is a small area of scrotal fat stranding on the right side (coronal image 36). Other: Left inguinal subcutaneous gas and confluent fat stranding beginning just below the left inguinal ligament and continuing distally into the anterior and medial thigh. Clustered gas near the skin surface on series 2, image 69. Reactive appearing left inguinal and left iliac lymph nodes, measuring individually up to 17 mm short axis. No cystic or necrotic nodes. No drainable fluid collection. Musculoskeletal: Motion artifact at both hip joints. Moderate to severe chronic degenerative changes in the visible lumbar spine. No acute osseous abnormality identified. IMPRESSION: 1. Soft tissue infection suspicious for Necrotizing Fasciitis or infection by a gas-forming organism. Inflammation with abundant subcutaneous gas extending from the left groin at the level of the inguinal ligament tracking into the anterior and medial left thigh. No associated abscess or fluid collection. 2. The scrotum appears largely spared except for skin thickening and a small area of right superior scrotal subcutaneous inflammation (series 4, image 36). 3. Reactive left inguinal and iliac station lymph nodes with no cystic or necrotic nodes. 4. No involvement of the anatomic pelvis. 5. Study discussed by telephone with Dr. Vonna Kotyk LONG on 04/19/2016 at 15:52 . Electronically Signed   By: Genevie Ann M.D.   On: 04/19/2016 15:54     LOS: 2 days   Oren Binet, MD  [facility] Pager:[phone]  If 7PM-7AM, please contact night-coverage [email or web address] Password [facility] 04/21/2016, 11:21 AM

## 2016-04-22 LAB — BASIC METABOLIC PANEL
ANION GAP: 10 (ref 5–15)
BUN: 8 mg/dL (ref 6–20)
CHLORIDE: 104 mmol/L (ref 101–111)
CO2: 23 mmol/L (ref 22–32)
Calcium: 8.4 mg/dL — ABNORMAL LOW (ref 8.9–10.3)
Creatinine, Ser: 1.11 mg/dL (ref 0.61–1.24)
GFR calc Af Amer: >60 mL/min (ref >60)
GFR calc non Af Amer: >60 mL/min (ref >60)
GLUCOSE: 206 mg/dL — AB (ref 65–99)
POTASSIUM: 3.9 mmol/L (ref 3.5–5.1)
Sodium: 137 mmol/L (ref 135–145)

## 2016-04-22 LAB — CBC
HCT: 35.3 % — ABNORMAL LOW (ref 39.0–52.0)
HEMOGLOBIN: 11.7 g/dL — AB (ref 13.0–17.0)
MCH: 30.2 pg (ref 26.0–34.0)
MCHC: 33.1 g/dL (ref 30.0–36.0)
MCV: 91 fL (ref 78.0–100.0)
Platelets: 318 10*3/uL (ref 150–400)
RBC: 3.88 MIL/uL — AB (ref 4.22–5.81)
RDW: 14.4 % (ref 11.5–15.5)
WBC: 19.4 10*3/uL — ABNORMAL HIGH (ref 4.0–10.5)

## 2016-04-22 LAB — GLUCOSE, CAPILLARY
GLUCOSE-CAPILLARY: 176 mg/dL — AB (ref 65–99)
Glucose-Capillary: 287 mg/dL — ABNORMAL HIGH (ref 65–99)
Glucose-Capillary: 241 mg/dL — ABNORMAL HIGH (ref 65–99)
Glucose-Capillary: 186 mg/dL — ABNORMAL HIGH (ref 65–99)

## 2016-04-22 LAB — HEMOGLOBIN A1C
Hgb A1c MFr Bld: 14.1 % — ABNORMAL HIGH (ref 4.8–5.6)
Mean Plasma Glucose: 358 mg/dL

## 2016-04-22 MED ORDER — ASPIRIN EC 81 MG PO TBEC
81.0000 mg | DELAYED_RELEASE_TABLET | Freq: Every day | ORAL | Status: DC
  Administered 2016-04-22 – 2016-04-25 (×4): 81 mg via ORAL
  Filled 2016-04-22 (×5): qty 1

## 2016-04-22 MED ORDER — INSULIN STARTER KIT- PEN NEEDLES (ENGLISH)
1.0000 | Freq: Once | Status: AC
  Administered 2016-04-22: 1
  Filled 2016-04-22: qty 1

## 2016-04-22 MED ORDER — LIVING WELL WITH DIABETES BOOK
Freq: Once | Status: AC
  Administered 2016-04-22: 16:00:00
  Filled 2016-04-22: qty 1

## 2016-04-22 NOTE — Progress Notes (Signed)
PROGRESS NOTE        PATIENT DETAILS Name: Kevin Goodman Age: 66 y.o. Sex: male Date of Birth: [birth year] Admit Date: 04/19/2016 Admitting Physician Karmen Bongo, MD LID:[id number] M, MD  Brief Narrative: Patient is a 66 y.o. male with history of morbid obesity, hypertension, diabetes, atrial fibrillation not on anticoagulation who presented with necrotizing infection of his left groin/left thigh area. He was seen by general surgery and underwent incision and drainage. See below for further details.  Subjective: Lying comfortably in bed-no chest pain or shortness of breath.  Assessment/Plan: Sepsis secondary to necrotizing fasciitis of left thigh/left groin area: Sepsis pathophysiology has resolved-patient is currently status post extensive excisional debridement on 2/13. Continues to have persistent leukocytosis-but per general surgery ward site looks good-plans are to continue empiric vancomycin and Azactam and await culture data. Gen. surgery contemplating placing wound VAC tomorrow  Uncontrolled type 2 diabetes with hyperglycemia: CBGs relatively stable CBGs-continue Lantus 30 units and SSI. Resume oral hypoglycemics on discharge. A1c 40.1. RN aware to provide insulin administration teaching.  Hypertension: Continue metoprolol-blood pressure currently controlled.  Acute renal failure: Likely hemodynamically mediated in a setting of sepsis. Resolved with IV fluids and other supportive measures.  History of paroxysmal atrial fibrillation: Maintaining sinus rhythm-chadsvasc is 2. Per patient, previously he was on anticoagulation with warfarin, apparently this was discontinued many years ago his PCP. Start aspirin, patient will discuss with his PCP in the outpatient setting regarding reinitiation of anticoagulation post discharge  Morbid obesity: Counseled regarding importance of weight loss.  DVT Prophylaxis: Prophylactic Heparin   Code  Status: Full code   Family Communication: None at bedside  Disposition Plan: Remain inpatient-but will plan on Home health  on discharge-probably in 1-2 days  Antimicrobial agents: Anti-infectives    Start     Dose/Rate Route Frequency Ordered Stop   04/20/16 1300  vancomycin (VANCOCIN) 1,750 mg in sodium chloride 0.9 % 500 mL IVPB     1,750 mg 250 mL/hr over 120 Minutes Intravenous Every 24 hours 04/19/16 1713     04/19/16 2100  aztreonam (AZACTAM) 1 g in dextrose 5 % 50 mL IVPB     1 g 100 mL/hr over 30 Minutes Intravenous Every 8 hours 04/19/16 1713     04/19/16 2000  clindamycin (CLEOCIN) IVPB 900 mg  Status:  Discontinued     900 mg 100 mL/hr over 30 Minutes Intravenous Every 8 hours 04/19/16 1911 04/21/16 0843   04/19/16 1300  metroNIDAZOLE (FLAGYL) IVPB 500 mg  Status:  Discontinued     500 mg 100 mL/hr over 60 Minutes Intravenous Every 8 hours 04/19/16 1253 04/19/16 1944   04/19/16 1300  vancomycin (VANCOCIN) 2,000 mg in sodium chloride 0.9 % 500 mL IVPB     2,000 mg 250 mL/hr over 120 Minutes Intravenous  Once 04/19/16 1253 04/19/16 1533   04/19/16 1245  aztreonam (AZACTAM) 2 g in dextrose 5 % 50 mL IVPB     2 g 100 mL/hr over 30 Minutes Intravenous  Once 04/19/16 1239 04/19/16 1324   04/19/16 1245  vancomycin (VANCOCIN) IVPB 1000 mg/200 mL premix  Status:  Discontinued     1,000 mg 200 mL/hr over 60 Minutes Intravenous  Once 04/19/16 1239 04/19/16 1354      Procedures: 2/13 excisional debridement of left thigh, 18cm x 9cm area involving full thickness skin and soft  tissue  CONSULTS:  general surgery  Time spent: 25 minutes-Greater than 50% of this time was spent in counseling, explanation of diagnosis, planning of further management, and coordination of care.  MEDICATIONS: Scheduled Meds: . allopurinol  300 mg Oral Daily  . aspirin EC  81 mg Oral Daily  . aztreonam  1 g Intravenous Q8H  . feeding supplement (PRO-STAT SUGAR FREE 64)  30 mL Oral BID  .  heparin subcutaneous  5,000 Units Subcutaneous Q8H  . insulin aspart  0-20 Units Subcutaneous TID WC  . insulin glargine  30 Units Subcutaneous QHS  . insulin starter kit- pen needles  1 kit Other Once  . living well with diabetes book   Does not apply Once  . metoprolol succinate  50 mg Oral Daily  . multivitamin with minerals  1 tablet Oral Daily  . vancomycin  1,750 mg Intravenous Q24H   Continuous Infusions: PRN Meds:.acetaminophen **OR** acetaminophen, albuterol, guaiFENesin-dextromethorphan, HYDROcodone-acetaminophen, morphine injection, ondansetron **OR** ondansetron (ZOFRAN) IV   PHYSICAL EXAM: Vital signs: Vitals:   04/21/16 0930 04/21/16 1332 04/22/16 0526 04/22/16 0619  BP: (!) 144/70 136/77 121/70 121/70  Pulse: 93 88 91   Resp:  _0 Temp:  98.9 F (37.2 C) 98.8 F (37.1 C) 98.8 F (37.1 C)  TempSrc:  Oral Oral Oral  SpO2:  100% 97% 97%  Weight:      Height:       Filed Weights   04/19/16 1200 04/19/16 2019  Weight: (!) 163.7 kg (361 lb) (!) 168.9 kg (372 lb 5.7 oz)   Body mass index is 49.13 kg/m.   General appearance :Awake, alert, not in any distress.  Eyes:, pupils equally reactive to light and accomodation,no scleral icterus. HEENT: Atraumatic and Normocephalic Neck: supple, no JVD. No cervical lymphadenopathy. Resp:Good air entry bilaterally, no added sounds  CVS: S1 S2 regular, no murmurs.  GI: Bowel sounds present, Non tender and not distended with no gaurding, rigidity or rebound.No organomegaly Extremities: B/L Lower Ext shows no edema, both legs are warm to touch.Dressing present in the left eye-did not open. Neurology:  speech clear,Non focal, sensation is grossly intact. Psychiatric: Normal judgment and insight. Alert and oriented x 3.  Musculoskeletal:No digital cyanosis Skin:No Rash, warm and dry Wounds:N/A  I have personally reviewed following labs and imaging studies  LABORATORY DATA: CBC:  Recent Labs Lab 04/19/16 1153  04/20/16 0208 04/20/16 1151 04/21/16 0608 04/22/16 0832  WBC 20.3*  --  18.2* 17.8* 19.4*  NEUTROABS 15.2*  --   --   --   --   HGB 13.9 12.6* 11.3* 11.1* 11.7*  HCT 40.5 37.0* 34.1* 33.7* 35.3*  MCV 90.6  --  90.7 91.1 91.0  PLT 280  --  251 260 742    Basic Metabolic Panel:  Recent Labs Lab 04/19/16 1153 04/20/16 0208 04/20/16 1151 04/21/16 0608 04/22/16 0832  NA 126* 135 135 135 137  K 3.7 4.1 3.9 3.7 3.9  CL 92*  --  102 101 104  CO2 19*  --  _1 GLUCOSE 673* 337* 282* 236* 206*  BUN 28*  --  21* 15 8  CREATININE 2.06*  --  1.29* 1.19 1.11  CALCIUM 8.6*  --  8.4* 7.9* 8.4*    GFR: Estimated Creatinine Clearance: 111.3 mL/min (by C-G formula based on SCr of 1.11 mg/dL).  Liver Function Tests:  Recent Labs Lab 04/19/16 1153  AST 28  ALT 25  ALKPHOS 60  BILITOT  0.8  PROT 7.3  ALBUMIN 2.9*   No results for input(s): LIPASE, AMYLASE in the last 168 hours. No results for input(s): AMMONIA in the last 168 hours.  Coagulation Profile:  Recent Labs Lab 04/19/16 1153  INR 1.21    Cardiac Enzymes: No results for input(s): CKTOTAL, CKMB, CKMBINDEX, TROPONINI in the last 168 hours.  BNP (last 3 results) No results for input(s): PROBNP in the last 8760 hours.  HbA1C:  Recent Labs  04/19/16 2137 04/21/16 0904  HGBA1C 14.3* 14.1*    CBG:  Recent Labs Lab 04/21/16 1119 04/21/16 1742 04/21/16 2114 04/22/16 0828 04/22/16 1204  GLUCAP 290* 218* 169* 176* 287*    Lipid Profile: No results for input(s): CHOL, HDL, LDLCALC, TRIG, CHOLHDL, LDLDIRECT in the last 72 hours.  Thyroid Function Tests: No results for input(s): TSH, T4TOTAL, FREET4, T3FREE, THYROIDAB in the last 72 hours.  Anemia Panel: No results for input(s): VITAMINB12, FOLATE, FERRITIN, TIBC, IRON, RETICCTPCT in the last 72 hours.  Urine analysis:    Component Value Date/Time   COLORURINE YELLOW 07/16/2011 [place] 07/16/2011 1407   LABSPEC 1.010  07/16/2011 1407   PHURINE 6.0 07/16/2011 1407   GLUCOSEU NEGATIVE 07/16/2011 1407   HGBUR TRACE (A) 07/16/2011 1407   BILIRUBINUR NEGATIVE 07/16/2011 1407   KETONESUR NEGATIVE 07/16/2011 1407   PROTEINUR NEGATIVE 07/16/2011 1407   UROBILINOGEN 0.2 07/16/2011 1407   NITRITE NEGATIVE 07/16/2011 1407   LEUKOCYTESUR SMALL (A) 07/16/2011 1407    Sepsis Labs: Lactic Acid, Venous    Component Value Date/Time   LATICACIDVEN 1.7 04/19/2016 2333    MICROBIOLOGY: Recent Results (from the past 240 hour(s))  Culture, blood (routine x 2)     Status: None (Preliminary result)   Collection Time: 04/19/16 12:30 PM  Result Value Ref Range Status   Specimen Description BLOOD LEFT ARM  Final   Special Requests BOTTLES DRAWN AEROBIC AND ANAEROBIC 6 CC EACH  Final   Culture NO GROWTH 3 DAYS  Final   Report Status PENDING  Incomplete  Culture, blood (routine x 2)     Status: None (Preliminary result)   Collection Time: 04/19/16 12:44 PM  Result Value Ref Range Status   Specimen Description BLOOD LEFT HAND  Final   Special Requests BOTTLES DRAWN AEROBIC AND ANAEROBIC 6 CC EACH  Final   Culture NO GROWTH 3 DAYS  Final   Report Status PENDING  Incomplete  Surgical pcr screen     Status: None   Collection Time: 04/19/16 11:28 PM  Result Value Ref Range Status   MRSA, PCR NEGATIVE NEGATIVE Final   Staphylococcus aureus NEGATIVE NEGATIVE Final    Comment:        The Xpert SA Assay (FDA approved for NASAL specimens in patients over 66 years of age), is one component of a comprehensive surveillance program.  Test performance has been validated by [facility] for patients greater than or equal to 19 year old. It is not intended to diagnose infection nor to guide or monitor treatment.   Aerobic/Anaerobic Culture (surgical/deep wound)     Status: None (Preliminary result)   Collection Time: 04/20/16  2:07 AM  Result Value Ref Range Status   Specimen Description ABSCESS  Final   Special  Requests LEFT THIGH DEBRIDEMENT  Final   Gram Stain   Final    FEW WBC PRESENT,BOTH PMN AND MONONUCLEAR MODERATE GRAM POSITIVE COCCI IN PAIRS FEW GRAM NEGATIVE RODS    Culture CULTURE REINCUBATED FOR BETTER  GROWTH  Final   Report Status PENDING  Incomplete    RADIOLOGY STUDIES/RESULTS: Dg Chest 2 View  Result Date: 04/19/2016 CLINICAL DATA:  large abscess on left groin. Began on scrotum and has spread to leg. History of A-FIB, DM, HTN. Pt refused to stand. EXAM: CHEST  2 VIEW COMPARISON:  07/16/2011 FINDINGS: The heart size and mediastinal contours are within normal limits. Both lungs are clear. The visualized skeletal structures are unremarkable. IMPRESSION: No active cardiopulmonary disease. Electronically Signed   By: Nolon Nations M.D.   On: 04/19/2016 18:22   Ct Pelvis W Contrast  Result Date: 04/19/2016 CLINICAL DATA:  66 year old male with left groin infection and abscess beginning 2 weeks ago. Diabetic. Initial encounter. EXAM: CT PELVIS WITH CONTRAST TECHNIQUE: Multidetector CT imaging of the pelvis was performed using the standard protocol following the bolus administration of intravenous contrast. CONTRAST:  19m ISOVUE-300 IOPAMIDOL (ISOVUE-300) INJECTION 61% COMPARISON:  None. FINDINGS: Urinary Tract: Distal ureters and urinary bladder appear unremarkable. Kidneys not included. Bowel: Negative visible large bowel, it including a partially visible normal retrocecal appendix. Negative visible small bowel. Vascular/Lymphatic: Suboptimal intravascular contrast in part related to decreased contrast dose in the setting of renal insufficiency. Mild femoral artery calcified peripheral vascular disease. No right inguinal or right iliac lymphadenopathy. See left side node details below. Reproductive: The scrotum itself appears to be spared except for superficial skin thickening. There is no scrotal gas. However, there is a small area of scrotal fat stranding on the right side (coronal image  36). Other: Left inguinal subcutaneous gas and confluent fat stranding beginning just below the left inguinal ligament and continuing distally into the anterior and medial thigh. Clustered gas near the skin surface on series 2, image 69. Reactive appearing left inguinal and left iliac lymph nodes, measuring individually up to 17 mm short axis. No cystic or necrotic nodes. No drainable fluid collection. Musculoskeletal: Motion artifact at both hip joints. Moderate to severe chronic degenerative changes in the visible lumbar spine. No acute osseous abnormality identified. IMPRESSION: 1. Soft tissue infection suspicious for Necrotizing Fasciitis or infection by a gas-forming organism. Inflammation with abundant subcutaneous gas extending from the left groin at the level of the inguinal ligament tracking into the anterior and medial left thigh. No associated abscess or fluid collection. 2. The scrotum appears largely spared except for skin thickening and a small area of right superior scrotal subcutaneous inflammation (series 4, image 36). 3. Reactive left inguinal and iliac station lymph nodes with no cystic or necrotic nodes. 4. No involvement of the anatomic pelvis. 5. Study discussed by telephone with Dr. JVonna KotykLONG on 04/19/2016 at 15:52 . Electronically Signed   By: HGenevie AnnM.D.   On: 04/19/2016 15:54     LOS: 3 days   GOren Binet MD  [facility] Pager:[phone] If 7PM-7AM, please contact night-coverage [email or web address] Password [email or web address]2/15/2018, 12:56 PM

## 2016-04-22 NOTE — Progress Notes (Signed)
Patient ID: Kevin Goodman, male   DOB: Jun 25, 1950, 66 y.o.   MRN: [id number]  [facility] Surgery Progress Note  2 Days Post-Op  Subjective: Feels well today. Tolerating dressing changes. Home health has been arranged for wound care; patient states that his brother will also be able to help with dressing changes. Asking for solid food to eat.  Objective: Vital signs in last 24 hours: Temp:  [98.8 F (37.1 C)-98.9 F (37.2 C)] 98.8 F (37.1 C) (02/15 0619) Pulse Rate:  [88-93] 88 (02/14 1332) Resp:  [16-18] 18 (02/15 0619) BP: (121-144)/(70-77) 121/70 (02/15 0619) SpO2:  [97 %-100 %] 97 % (02/15 0619) Last BM Date: 04/21/16  Intake/Output from previous day: 02/14 0701 - 02/15 0700 In: 550 [IV Piggyback:550] Out: 1300 [Urine:1300] Intake/Output this shift: Total I/O In: -  Out: 300 [Urine:300]  PE: Gen: Alert, NAD, pleasant Pulm: Effort normal Abd: Soft, NT/ND Left thigh: anterior/proximal thigh wound s/p I&D, serous drainage noted at base of wound, tissue is pink, no surrounding cellulitis      Lab Results:   Recent Labs  04/20/16 1151 04/21/16 0608  WBC 18.2* 17.8*  HGB 11.3* 11.1*  HCT 34.1* 33.7*  PLT 251 260   BMET  Recent Labs  04/20/16 1151 04/21/16 0608  NA 135 135  K 3.9 3.7  CL 102 101  CO2 24 24  GLUCOSE 282* 236*  BUN 21* 15  CREATININE 1.29* 1.19  CALCIUM 8.4* 7.9*   PT/INR  Recent Labs  04/19/16 1153  LABPROT 15.4*  INR 1.21   CMP     Component Value Date/Time   NA 135 04/21/2016 0608   K 3.7 04/21/2016 0608   CL 101 04/21/2016 0608   CO2 24 04/21/2016 0608   GLUCOSE 236 (H) 04/21/2016 0608   BUN 15 04/21/2016 0608   CREATININE 1.19 04/21/2016 0608   CALCIUM 7.9 (L) 04/21/2016 0608   PROT 7.3 04/19/2016 1153   ALBUMIN 2.9 (L) 04/19/2016 1153   AST 28 04/19/2016 1153   ALT 25 04/19/2016 1153   ALKPHOS 60 04/19/2016 1153   BILITOT 0.8 04/19/2016 1153   GFRNONAA >60 04/21/2016 0608   GFRAA >60 04/21/2016 0608     Lipase  No results found for: LIPASE     Studies/Results: No results found.  Anti-infectives: Anti-infectives    Start     Dose/Rate Route Frequency Ordered Stop   04/20/16 1300  vancomycin (VANCOCIN) 1,750 mg in sodium chloride 0.9 % 500 mL IVPB     1,750 mg 250 mL/hr over 120 Minutes Intravenous Every 24 hours 04/19/16 1713     04/19/16 2100  aztreonam (AZACTAM) 1 g in dextrose 5 % 50 mL IVPB     1 g 100 mL/hr over 30 Minutes Intravenous Every 8 hours 04/19/16 1713     04/19/16 2000  clindamycin (CLEOCIN) IVPB 900 mg  Status:  Discontinued     900 mg 100 mL/hr over 30 Minutes Intravenous Every 8 hours 04/19/16 1911 04/21/16 0843   04/19/16 1300  metroNIDAZOLE (FLAGYL) IVPB 500 mg  Status:  Discontinued     500 mg 100 mL/hr over 60 Minutes Intravenous Every 8 hours 04/19/16 1253 04/19/16 1944   04/19/16 1300  vancomycin (VANCOCIN) 2,000 mg in sodium chloride 0.9 % 500 mL IVPB     2,000 mg 250 mL/hr over 120 Minutes Intravenous  Once 04/19/16 1253 04/19/16 1533   04/19/16 1245  aztreonam (AZACTAM) 2 g in dextrose 5 % 50 mL IVPB  2 g 100 mL/hr over 30 Minutes Intravenous  Once 04/19/16 1239 04/19/16 1324   04/19/16 1245  vancomycin (VANCOCIN) IVPB 1000 mg/200 mL premix  Status:  Discontinued     1,000 mg 200 mL/hr over 60 Minutes Intravenous  Once 04/19/16 1239 04/19/16 1354       Assessment/Plan Necrotizing fasciitis Excisional debridement of left thigh, 18cm x 9cm area involving full thickness skin and soft tissue2/13 Luke Kinsinger - POD 2 - gram stain: MODERATE GRAM POSITIVE COCCI, FEW GRAM NEGATIVE RODS - culture pending - surgical path: EXTENSIVE SUPPURATIVE INFLAMMATION WITH NECROSIS AND ABSCESS, ENLARGED LYMPH NODE WITH REACTIVE CHANGES - labs pending this AM, afebrile  Poorly controlled DM - A1C 14.1 H/o A fib  ID - clinda 12/12>>2/14, vanco 12/12>>, azactam 12/12>> FEN - carb modified diet  Plan - Labs pending. Wound with some serous drainage  today. Increase to TID wet to dry dressing changes. Encourage daily shower with wound open. Continue broad spectrum antibiotics and follow culture. Needs good education of diabetes control.   LOS: 3 days    Jerrye Beavers , [facility] Surgery 04/22/2016, 8:17 AM Pager: [phone] Consults: [phone] Mon-Fri 7:00 am-4:30 pm Sat-Sun 7:00 am-11:30 am

## 2016-04-22 NOTE — Care Management Note (Addendum)
Case Management Note  Patient Details  Name: Kevin Goodman MRN: [id number] Date of Birth: April 17, 1950  Subjective/Objective:                 Spoke to patient at the bedside. He stated that after discharge he will DC to his wife Myra's house. Address [address], [place] [id number]. He states he will halp and support there. He requested RW and BSC (will need bariatric). He states that he would like to use [facility] home health. Patient states he is to have surgery tomorrow to wound, and is uncertain if he will have a wound VAC.    Action/Plan:  CM routed [facility] orders and demographics to [facility], [phone] Orders placed for bariatric RW and [facility], referral made to [facility] for delivery to room 2/15.   Patient will need DC summ faxed to [facility], and wound VAC set up if applicable.  Expected Discharge Date:                  Expected Discharge Plan:  [place]  In-House Referral:     Discharge planning Services  CM Consult  Post Acute Care Choice:    Choice offered to:  Patient  DME Arranged:    DME Agency:     HH Arranged:    [place]:  [facility]  Status of Service:  In process, will continue to follow  If discussed at Long Length of Stay Meetings, dates discussed:    Additional Comments:  Carles Collet, RN 04/22/2016, 11:17 AM

## 2016-04-22 NOTE — Progress Notes (Signed)
Pharmacy Antibiotic Note  Kevin Goodman is a 66 y.o. male admitted on 04/19/2016 with right groin infection s/p debridement 2/13.  Pharmacy has been consulted for Vancomycin and Azactam dosing. Renal function is stable. Will hold off on vancomycin trough since Strep may be growing in abscess culture.  Plan: Vancomycin 1750mg  IV every 24 hours.  Goal trough 15-20 mcg/mL. Azactam 1 g IV q8h F/U cxs and clinical progress Monitor V/S and labs and levels as indicated  Height: 6\' 1"  (185.4 cm) Weight: (!) 372 lb 5.7 oz (168.9 kg) IBW/kg (Calculated) : 79.9  Temp (24hrs), Avg:98.8 F (37.1 C), Min:98.8 F (37.1 C), Max:98.9 F (37.2 C)   Recent Labs Lab 04/19/16 1153 04/19/16 1248 04/19/16 1549 04/19/16 2120 04/19/16 2333 04/20/16 1151 04/21/16 0608 04/22/16 0832  WBC 20.3*  --   --   --   --  18.2* 17.8* 19.4*  CREATININE 2.06*  --   --   --   --  1.29* 1.19 1.11  LATICACIDVEN  --  3.0* 2.3* 2.2* 1.7  --   --   --     Normalized CrCl 56mls/min  Estimated Creatinine Clearance: 111.3 mL/min (by C-G formula based on SCr of 1.11 mg/dL).    Allergies  Allergen Reactions  . Shrimp [Shellfish Allergy] Anaphylaxis  . Penicillins Other (See Comments)    Childhood allergy    Antimicrobials this admission: Clinda 2/12 >> 2/14 Vancomycin 2/12>> Aztreonam 2/12>> Metronidazole 2/12   Dose adjustments this admission: N/A  Microbiology results: 2/12BCx: ngtd 2/13 abscess: mod GPC pairs, few GNR  Thank you for allowing pharmacy to be a part of this patient's care.  Renold Genta, PharmD, BCPS Clinical Pharmacist Phone for today - [place] - [phone] 04/22/2016 1:16 PM

## 2016-04-22 NOTE — Progress Notes (Addendum)
Inpatient Diabetes Program Recommendations  AACE/ADA: New Consensus Statement on Inpatient Glycemic Control (2015)  Target Ranges:  Prepandial:   less than 140 mg/dL      Peak postprandial:   less than 180 mg/dL (1-2 hours)      Critically ill patients:  140 - 180 mg/dL  Results for Kevin Goodman, Kevin Goodman (MRN [id number]) as of 04/22/2016 08:10  Ref. Range 04/20/2016 22:02 04/21/2016 09:17 04/21/2016 11:19 04/21/2016 17:42 04/21/2016 21:14  Glucose-Capillary Latest Ref Range: 65 - 99 mg/dL 198 (H) 259 (H) 290 (H) 218 (H) 169 (H)   Results for Kevin Goodman (MRN [id number]) as of 04/22/2016 08:10  Ref. Range 04/21/2016 09:04  Hemoglobin A1C Latest Ref Range: 4.8 - 5.6 % 14.1 (H)    Review of Glycemic Control  Diabetes history: DM2 Outpatient Diabetes medications:Metformin 500 mg BID Current orders for Inpatient glycemic control: Lantus 30 units QHS, Novolog 0-20 units TID with meals  Inpatient Diabetes Program Recommendations: Insulin - Meal Coverage: Please consider ordering Novolog 6 units TID with meals for meal cvoerage. Insulin-correction: Please consider ordering Novolog 0-5 units QHS for bedtime correction. HgbA1C: A1C 14.1% on 04/21/16 indicating an average glucose fo 358 mg/dl over the past 2-3 months. Recommend discharging patient on insulin and have patient follow up with PCP.  NOTE: Ordered Living Well with Diabetes booklet, insulin starter kit, patient education videos on DM and insulin, and patient education by nursing.  BEDSIDE NURSING: Please use each patient interaction to provide diabetes education. Please review Living Well with Diabetes booklet with the patient, have patient watch patient education videos on diabetes, and instruct on insulin administration. Please allow patient to be actively engaged with diabetes management by allowing patient to check own glucose and self-administer insulin injections. Diabetes Coordinator will follow up with patient and reinforce diabetes  education.   Addendum 04/22/16_0 :42-Spoke with patient over the phone (Diabetes Coordinator on [place] today and assessing patient remotely) about diabetes and home regimen for diabetes control. Patient reports that he is followed by PCP for diabetes management and currently he takes Metformin 500 mg BID as an outpatient for diabetes control. Patient reports that he last seen his PCP last week and his glucose was noted to be 450 mg/dl at the office. Inquired about any changes with DM medications and patient reports that no changes were made with DM medications at his last office visit. Patient does not have a glucometer or testing supplies and has not been checking his glucose as his PCP has not asked him to monitor glucose at home. Discussed A1C results (14.1% pm 04/21/16) and explained that his current A1C indicates an average glucose of 358 mg/dl over the past 2-3 months. Discussed glucose and A1C goals. Discussed importance of checking CBGs and maintaining good CBG control to prevent long-term and short-term complications. Explained how hyperglycemia leads to damage within blood vessels which lead to the common complications seen with uncontrolled diabetes. Stressed to the patient the importance of improving glycemic control to prevent further complications from uncontrolled diabetes and improve wound healing. Discussed impact of nutrition, exercise, stress, sickness, and medications on diabetes control.Discussed Lantus and Novolog insulin as currently ordered and explained he will likely be discharging on insulin. Patient reports that his wife use to take insulin (insulin pens) and he is more than willing to take insulin as an outpatient. Patient states that he would prefer to use insulin pens. Patient has been self injecting insulin since yesterday afternoon. Informed patient he should be receiving the  Living Well with Diabetes booklet and the insulin starter kit. Asked patient to review both when  he receives them from his nurse and we will discuss in more depth tomorrow after he reviews the information. Discussed risk of hypoglycemia with insulin use and discussed hypoglycemia in detail along with proper treatment.  Encouraged patient to check his glucose 4 times per day (before meals and at bedtime) and to keep a log book of glucose readings and insulin taken which he will need to take to doctor appointments. Explained how his doctor can use the log book to continue to make insulin adjustments if needed. Patient verbalized understanding of information discussed and he states that he has no further questions at this time related to diabetes. Diabetes Coordinator will see patient in person tomorrow to follow up and reinforce education.  Thanks, Barnie Alderman, RN, MSN, CDE Diabetes Coordinator Inpatient Diabetes Program [phone] (Team Pager from 8am to 5pm)

## 2016-04-23 DIAGNOSIS — M726 Necrotizing fasciitis: Secondary | ICD-10-CM

## 2016-04-23 LAB — GLUCOSE, CAPILLARY
GLUCOSE-CAPILLARY: 140 mg/dL — AB (ref 65–99)
Glucose-Capillary: 166 mg/dL — ABNORMAL HIGH (ref 65–99)
Glucose-Capillary: 237 mg/dL — ABNORMAL HIGH (ref 65–99)
Glucose-Capillary: 183 mg/dL — ABNORMAL HIGH (ref 65–99)

## 2016-04-23 LAB — CBC
HEMATOCRIT: 32 % — AB (ref 39.0–52.0)
Hemoglobin: 10.6 g/dL — ABNORMAL LOW (ref 13.0–17.0)
MCH: 30.1 pg (ref 26.0–34.0)
MCHC: 33.1 g/dL (ref 30.0–36.0)
MCV: 90.9 fL (ref 78.0–100.0)
Platelets: 343 10*3/uL (ref 150–400)
RBC: 3.52 MIL/uL — AB (ref 4.22–5.81)
RDW: 14.4 % (ref 11.5–15.5)
WBC: 17.9 10*3/uL — ABNORMAL HIGH (ref 4.0–10.5)

## 2016-04-23 MED ORDER — METRONIDAZOLE 500 MG PO TABS
500.0000 mg | ORAL_TABLET | Freq: Three times a day (TID) | ORAL | Status: DC
  Administered 2016-04-23 – 2016-04-25 (×7): 500 mg via ORAL
  Filled 2016-04-23 (×7): qty 1

## 2016-04-23 MED ORDER — CEFUROXIME AXETIL 500 MG PO TABS
500.0000 mg | ORAL_TABLET | Freq: Two times a day (BID) | ORAL | Status: DC
  Administered 2016-04-23 – 2016-04-25 (×4): 500 mg via ORAL
  Filled 2016-04-23 (×5): qty 1

## 2016-04-23 NOTE — Progress Notes (Signed)
Patient ID: Kevin Goodman, male   DOB: 04-Jun-1950, 66 y.o.   MRN: [id number]  [facility] Surgery Progress Note  3 Days Post-Op  Subjective: No complaints this morning. Tolerating diet and pain well controlled. WBC is trending down and he is afebrile.  Objective: Vital signs in last 24 hours: Temp:  [98.5 F (36.9 C)-99.3 F (37.4 C)] 98.5 F (36.9 C) (02/16 0411) Pulse Rate:  [85-96] 94 (02/16 0710) Resp:  [18-22] 18 (02/16 0710) BP: (133-175)/(71-82) 154/71 (02/16 0710) SpO2:  [97 %-99 %] 97 % (02/16 0710) Last BM Date: 04/22/16  Intake/Output from previous day: 02/15 0701 - 02/16 0700 In: 460 [P.O.:460] Out: 1455 [Urine:1455] Intake/Output this shift: No intake/output data recorded.  PE: Gen: Alert, NAD, pleasant Pulm: Effort normal Abd: Soft, NT/ND Left thigh: anterior/proximal thigh wound s/p I&D, serous drainage noted at base of wound, tissue is pink, no surrounding cellulitis  Lab Results:   Recent Labs  04/22/16 0832 04/23/16 0455  WBC 19.4* 17.9*  HGB 11.7* 10.6*  HCT 35.3* 32.0*  PLT 318 343   BMET  Recent Labs  04/21/16 0608 04/22/16 0832  NA 135 137  K 3.7 3.9  CL 101 104  CO2 24 23  GLUCOSE 236* 206*  BUN 15 8  CREATININE 1.19 1.11  CALCIUM 7.9* 8.4*   PT/INR No results for input(s): LABPROT, INR in the last 72 hours. CMP     Component Value Date/Time   NA 137 04/22/2016 0832   K 3.9 04/22/2016 0832   CL 104 04/22/2016 0832   CO2 23 04/22/2016 0832   GLUCOSE 206 (H) 04/22/2016 0832   BUN 8 04/22/2016 0832   CREATININE 1.11 04/22/2016 0832   CALCIUM 8.4 (L) 04/22/2016 0832   PROT 7.3 04/19/2016 1153   ALBUMIN 2.9 (L) 04/19/2016 1153   AST 28 04/19/2016 1153   ALT 25 04/19/2016 1153   ALKPHOS 60 04/19/2016 1153   BILITOT 0.8 04/19/2016 1153   GFRNONAA >60 04/22/2016 0832   GFRAA >60 04/22/2016 0832   Lipase  No results found for: LIPASE     Studies/Results: No results found.  Anti-infectives: Anti-infectives     Start     Dose/Rate Route Frequency Ordered Stop   04/20/16 1300  vancomycin (VANCOCIN) 1,750 mg in sodium chloride 0.9 % 500 mL IVPB     1,750 mg 250 mL/hr over 120 Minutes Intravenous Every 24 hours 04/19/16 1713     04/19/16 2100  aztreonam (AZACTAM) 1 g in dextrose 5 % 50 mL IVPB     1 g 100 mL/hr over 30 Minutes Intravenous Every 8 hours 04/19/16 1713     04/19/16 2000  clindamycin (CLEOCIN) IVPB 900 mg  Status:  Discontinued     900 mg 100 mL/hr over 30 Minutes Intravenous Every 8 hours 04/19/16 1911 04/21/16 0843   04/19/16 1300  metroNIDAZOLE (FLAGYL) IVPB 500 mg  Status:  Discontinued     500 mg 100 mL/hr over 60 Minutes Intravenous Every 8 hours 04/19/16 1253 04/19/16 1944   04/19/16 1300  vancomycin (VANCOCIN) 2,000 mg in sodium chloride 0.9 % 500 mL IVPB     2,000 mg 250 mL/hr over 120 Minutes Intravenous  Once 04/19/16 1253 04/19/16 1533   04/19/16 1245  aztreonam (AZACTAM) 2 g in dextrose 5 % 50 mL IVPB     2 g 100 mL/hr over 30 Minutes Intravenous  Once 04/19/16 1239 04/19/16 1324   04/19/16 1245  vancomycin (VANCOCIN) IVPB 1000 mg/200 mL premix  Status:  Discontinued     1,000 mg 200 mL/hr over 60 Minutes Intravenous  Once 04/19/16 1239 04/19/16 1354       Assessment/Plan Necrotizing fasciitis Excisional debridement of left thigh, 18cm x 9cm area involving full thickness skin and soft tissue2/13 Luke Kinsinger - POD 3 - gram stain: MODERATE GRAM POSITIVE COCCI, FEW GRAM NEGATIVE RODS - culture pending - surgical path: EXTENSIVE SUPPURATIVE INFLAMMATION WITH NECROSIS AND ABSCESS, ENLARGED LYMPH NODE WITH REACTIVE CHANGES - WBC trending down, afebrile  Poorly controlled DM - A1C 14.1 H/o A fib  ID - clinda 12/12>>2/14, vanco 12/12>>, azactam 12/12>> FEN - carb modified diet  Plan - Transition to wound vac today. Appreciate WOC consult. From surgical standpoint ok to switch to PO antibiotics x1 week and discharge today. Follow culture. Follow-up with Dr.  Kieth Brightly in about 2 weeks.    LOS: 4 days    Jerrye Beavers , [facility] Surgery 04/23/2016, 8:00 AM Pager: [phone] Consults: [phone] Mon-Fri 7:00 am-4:30 pm Sat-Sun 7:00 am-11:30 am

## 2016-04-23 NOTE — Progress Notes (Addendum)
Inpatient Diabetes Program Recommendations  AACE/ADA: New Consensus Statement on Inpatient Glycemic Control (2015)  Target Ranges:  Prepandial:   less than 140 mg/dL      Peak postprandial:   less than 180 mg/dL (1-2 hours)      Critically ill patients:  140 - 180 mg/dL   Results for Kevin Goodman, Kevin Goodman (MRN [id number]) as of 04/23/2016 08:41  Ref. Range 04/22/2016 08:28 04/22/2016 12:04 04/22/2016 17:15 04/22/2016 21:19 04/23/2016 07:42  Glucose-Capillary Latest Ref Range: 65 - 99 mg/dL 176 (H) 287 (H) 241 (H) 186 (H) 166 (H)   Review of Glycemic Control  Diabetes history: DM2 Outpatient Diabetes medications:Metformin 500 mg BID Current orders for Inpatient glycemic control: Lantus 30 units QHS, Novolog 0-20 units TID with meals  Inpatient Diabetes Program Recommendations: Insulin - Meal Coverage: Post prandial is consistently elevated and patient would benefit from Novolog meal coverage. Please consider ordering Novolog 6 units TID with meals for meal cvoerage. Insulin-correction: Please consider ordering Novolog 0-5 units QHS for bedtime correction. HgbA1C: A1C 14.1% on 04/21/16 indicating an average glucose fo 358 mg/dl over the past 2-3 months. Recommend discharging patient on insulin and have patient follow up with PCP.  Addendum 04/23/16'@12' :43-Talked with patient; reviewed DM education that was taught yesterday and patient is able to answer various questions correctly. Discussed insulin and insulin pens (patient reported yesterday that he preferred to use insulin pens). Educated patient on insulin pen use at home. Reviewed contents of insulin flexpen starter kit. Reviewed all steps of insulin pen including attachment of needle, 2-unit air shot, dialing up dose, giving injection, removing needle, disposal of sharps, storage of unused insulin, disposal of insulin etc. Patient able to provide successful return demonstration.  MD to give patient Rxs for insulin pens and insulin pen  needles.   Thanks, Barnie Alderman, RN, MSN, CDE Diabetes Coordinator Inpatient Diabetes Program [phone] (Team Pager from 8am to 5pm)

## 2016-04-23 NOTE — Consult Note (Signed)
[facility] Nurse wound consult note Surgical team following for assessment and plan of care to left groin wound.  Requested to apply Vac dressing. Wound type: Full thickness post-surgical wound to left thigh Measurement: 4X20X6.5cm Wound bed: 85% red, interspersed with 15% yellow Drainage (amount, consistency, odor) Mod amt yellow drainage, no odor  Periwound: Erythremia and induration surrounding wound Dressing procedure/placement/frequency: Applied 2 pieces black foam and a barrier ring to attempt to maintain the seal to near the groin area where skin is moist and macerated and it is difficult adhere Vac drape.  Pt medicated for pain and tolerated with minimal discomfort.  Plan to change dressing on Monday if he is still in the hospital at that time.  Pt states right groin has developed a new area of drainage; there is a red raised vesicle with fluctuance and mod amt tan pus draining.  Notified surgical PA of need for possible bedside debridement to this site. Julien Girt MSN, RN, [place], [place], [place]

## 2016-04-23 NOTE — Progress Notes (Signed)
PROGRESS NOTE        PATIENT DETAILS Name: Kevin Goodman Age: 66 y.o. Sex: male Date of Birth: [birth year] Admit Date: 04/19/2016 Admitting Physician Karmen Bongo, MD OP:[id number] M, MD  Brief Narrative: Patient is a 66 y.o. male with history of morbid obesity, hypertension, diabetes, atrial fibrillation not on anticoagulation who presented with necrotizing infection of his left groin/left thigh area. He was seen by general surgery and underwent incision and drainage. See below for further details.  Subjective: Lying comfortably in bed-no chest pain or shortness of breath. Wound VAC applied this morning by RN  Assessment/Plan: Sepsis secondary to necrotizing fasciitis of left thigh/left groin area: Sepsis pathophysiology has resolved-patient is currently status post extensive excisional debridement on 2/13. Continues to have persistent leukocytosis-no indication of infection at the surgical site-suspect leukocytosis from ongoing inflammation rather than active infection. Also has a very small pustule on the right scrotal area that was drained at bedside by general surgery this morning. Awaiting final culture data (Gram stain showing both gram-positive cocci and gram-negative rods). For now continue with broad-spectrum antibiotics, once cultures are final we will transition to oral antibiotics-will plan a total of 2 weeks of antimicrobial therapy. Wound VAC applied this morning, Gen. surgery recommending reassessment on 2/17 prior to consideration of discharge.  Uncontrolled type 2 diabetes with hyperglycemia: CBGs relatively stable CBGs-continue Lantus 30 units and SSI. Resume oral hypoglycemics on discharge. A1c 40.1. RN aware to provide insulin administration teaching.  Hypertension: Continue metoprolol-blood pressure currently controlled.  Acute renal failure: Likely hemodynamically mediated in a setting of sepsis. Resolved with IV fluids and other  supportive measures.  History of paroxysmal atrial fibrillation: Maintaining sinus rhythm-chadsvasc is 2. Per patient, previously he was on anticoagulation with warfarin, apparently this was discontinued many years ago his PCP. Start aspirin, patient will discuss with his PCP in the outpatient setting regarding reinitiation of anticoagulation post discharge  Morbid obesity: Counseled regarding importance of weight loss.  DVT Prophylaxis: Prophylactic Heparin   Code Status: Full code   Family Communication: None at bedside  Disposition Plan: Remain inpatient-but will plan on Home health  on discharge-probably on 2/17  Antimicrobial agents: Anti-infectives    Start     Dose/Rate Route Frequency Ordered Stop   04/20/16 1300  vancomycin (VANCOCIN) 1,750 mg in sodium chloride 0.9 % 500 mL IVPB     1,750 mg 250 mL/hr over 120 Minutes Intravenous Every 24 hours 04/19/16 1713     04/19/16 2100  aztreonam (AZACTAM) 1 g in dextrose 5 % 50 mL IVPB     1 g 100 mL/hr over 30 Minutes Intravenous Every 8 hours 04/19/16 1713     04/19/16 2000  clindamycin (CLEOCIN) IVPB 900 mg  Status:  Discontinued     900 mg 100 mL/hr over 30 Minutes Intravenous Every 8 hours 04/19/16 1911 04/21/16 0843   04/19/16 1300  metroNIDAZOLE (FLAGYL) IVPB 500 mg  Status:  Discontinued     500 mg 100 mL/hr over 60 Minutes Intravenous Every 8 hours 04/19/16 1253 04/19/16 1944   04/19/16 1300  vancomycin (VANCOCIN) 2,000 mg in sodium chloride 0.9 % 500 mL IVPB     2,000 mg 250 mL/hr over 120 Minutes Intravenous  Once 04/19/16 1253 04/19/16 1533   04/19/16 1245  aztreonam (AZACTAM) 2 g in dextrose 5 % 50 mL IVPB  2 g 100 mL/hr over 30 Minutes Intravenous  Once 04/19/16 1239 04/19/16 1324   04/19/16 1245  vancomycin (VANCOCIN) IVPB 1000 mg/200 mL premix  Status:  Discontinued     1,000 mg 200 mL/hr over 60 Minutes Intravenous  Once 04/19/16 1239 04/19/16 1354      Procedures: 2/13 excisional debridement of left  thigh, 18cm x 9cm area involving full thickness skin and soft tissue  CONSULTS:  general surgery  Time spent: 25 minutes-Greater than 50% of this time was spent in counseling, explanation of diagnosis, planning of further management, and coordination of care.  MEDICATIONS: Scheduled Meds: . allopurinol  300 mg Oral Daily  . aspirin EC  81 mg Oral Daily  . aztreonam  1 g Intravenous Q8H  . feeding supplement (PRO-STAT SUGAR FREE 64)  30 mL Oral BID  . heparin subcutaneous  5,000 Units Subcutaneous Q8H  . insulin aspart  0-20 Units Subcutaneous TID WC  . insulin glargine  30 Units Subcutaneous QHS  . metoprolol succinate  50 mg Oral Daily  . multivitamin with minerals  1 tablet Oral Daily  . vancomycin  1,750 mg Intravenous Q24H   Continuous Infusions: PRN Meds:.acetaminophen **OR** acetaminophen, albuterol, guaiFENesin-dextromethorphan, HYDROcodone-acetaminophen, morphine injection, ondansetron **OR** ondansetron (ZOFRAN) IV   PHYSICAL EXAM: Vital signs: Vitals:   04/22/16 2100 04/23/16 0411 04/23/16 0710 04/23/16 0800  BP: (!) 144/77 (!) 175/78 (!) 154/71 134/76  Pulse: 96 85 94 91  Resp: 19 18 18 12   Temp: 99.1 F (37.3 C) 98.5 F (36.9 C)    TempSrc: Oral     SpO2: 98% 99% 97% 100%  Weight:      Height:       Filed Weights   04/19/16 1200 04/19/16 2019  Weight: (!) 163.7 kg (361 lb) (!) 168.9 kg (372 lb 5.7 oz)   Body mass index is 49.13 kg/m.   General appearance :Awake, alert, not in any distress.  Eyes:, pupils equally reactive to light and accomodation,no scleral icterus. HEENT: Atraumatic and Normocephalic Neck: supple, no JVD. No cervical lymphadenopathy. Resp:Good air entry bilaterally, no added sounds  CVS: S1 S2 regular, no murmurs.  GI: Bowel sounds present, Non tender and not distended with no gaurding, rigidity or rebound.No organomegaly Extremities: B/L Lower Ext shows no edema, both legs are warm to touch.Wound vac on left upper thigh Neurology:   speech clear,Non focal, sensation is grossly intact. Psychiatric: Normal judgment and insight. Alert and oriented x 3.  Musculoskeletal:No digital cyanosis Skin:No Rash, warm and dry Wounds:N/A  I have personally reviewed following labs and imaging studies  LABORATORY DATA: CBC:  Recent Labs Lab 04/19/16 1153 04/20/16 0208 04/20/16 1151 04/21/16 0608 04/22/16 0832 04/23/16 0455  WBC 20.3*  --  18.2* 17.8* 19.4* 17.9*  NEUTROABS 15.2*  --   --   --   --   --   HGB 13.9 12.6* 11.3* 11.1* 11.7* 10.6*  HCT 40.5 37.0* 34.1* 33.7* 35.3* 32.0*  MCV 90.6  --  90.7 91.1 91.0 90.9  PLT 280  --  251 260 318 [id number]    Basic Metabolic Panel:  Recent Labs Lab 04/19/16 1153 04/20/16 0208 04/20/16 1151 04/21/16 0608 04/22/16 0832  NA 126* 135 135 135 137  K 3.7 4.1 3.9 3.7 3.9  CL 92*  --  102 101 104  CO2 19*  --  24 24 23   GLUCOSE 673* 337* 282* 236* 206*  BUN 28*  --  21* 15 8  CREATININE 2.06*  --  1.29* 1.19 1.11  CALCIUM 8.6*  --  8.4* 7.9* 8.4*    GFR: Estimated Creatinine Clearance: 111.3 mL/min (by C-G formula based on SCr of 1.11 mg/dL).  Liver Function Tests:  Recent Labs Lab 04/19/16 1153  AST 28  ALT 25  ALKPHOS 60  BILITOT 0.8  PROT 7.3  ALBUMIN 2.9*   No results for input(s): LIPASE, AMYLASE in the last 168 hours. No results for input(s): AMMONIA in the last 168 hours.  Coagulation Profile:  Recent Labs Lab 04/19/16 1153  INR 1.21    Cardiac Enzymes: No results for input(s): CKTOTAL, CKMB, CKMBINDEX, TROPONINI in the last 168 hours.  BNP (last 3 results) No results for input(s): PROBNP in the last 8760 hours.  HbA1C:  Recent Labs  04/21/16 0904  HGBA1C 14.1*    CBG:  Recent Labs Lab 04/22/16 0828 04/22/16 1204 04/22/16 1715 04/22/16 2119 04/23/16 0742  GLUCAP 176* 287* 241* 186* 166*    Lipid Profile: No results for input(s): CHOL, HDL, LDLCALC, TRIG, CHOLHDL, LDLDIRECT in the last 72 hours.  Thyroid Function Tests: No  results for input(s): TSH, T4TOTAL, FREET4, T3FREE, THYROIDAB in the last 72 hours.  Anemia Panel: No results for input(s): VITAMINB12, FOLATE, FERRITIN, TIBC, IRON, RETICCTPCT in the last 72 hours.  Urine analysis:    Component Value Date/Time   COLORURINE YELLOW 07/16/2011 [place] 07/16/2011 1407   LABSPEC 1.010 07/16/2011 1407   PHURINE 6.0 07/16/2011 1407   GLUCOSEU NEGATIVE 07/16/2011 1407   HGBUR TRACE (A) 07/16/2011 1407   BILIRUBINUR NEGATIVE 07/16/2011 1407   KETONESUR NEGATIVE 07/16/2011 1407   PROTEINUR NEGATIVE 07/16/2011 1407   UROBILINOGEN 0.2 07/16/2011 1407   NITRITE NEGATIVE 07/16/2011 1407   LEUKOCYTESUR SMALL (A) 07/16/2011 1407    Sepsis Labs: Lactic Acid, Venous    Component Value Date/Time   LATICACIDVEN 1.7 04/19/2016 2333    MICROBIOLOGY: Recent Results (from the past 240 hour(s))  Culture, blood (routine x 2)     Status: None (Preliminary result)   Collection Time: 04/19/16 12:30 PM  Result Value Ref Range Status   Specimen Description BLOOD LEFT ARM  Final   Special Requests BOTTLES DRAWN AEROBIC AND ANAEROBIC 6 CC EACH  Final   Culture NO GROWTH 4 DAYS  Final   Report Status PENDING  Incomplete  Culture, blood (routine x 2)     Status: None (Preliminary result)   Collection Time: 04/19/16 12:44 PM  Result Value Ref Range Status   Specimen Description BLOOD LEFT HAND  Final   Special Requests BOTTLES DRAWN AEROBIC AND ANAEROBIC 6 CC EACH  Final   Culture NO GROWTH 4 DAYS  Final   Report Status PENDING  Incomplete  Surgical pcr screen     Status: None   Collection Time: 04/19/16 11:28 PM  Result Value Ref Range Status   MRSA, PCR NEGATIVE NEGATIVE Final   Staphylococcus aureus NEGATIVE NEGATIVE Final    Comment:        The Xpert SA Assay (FDA approved for NASAL specimens in patients over 49 years of age), is one component of a comprehensive surveillance program.  Test performance has been validated by [facility] for  patients greater than or equal to 27 year old. It is not intended to diagnose infection nor to guide or monitor treatment.   Aerobic/Anaerobic Culture (surgical/deep wound)     Status: None (Preliminary result)   Collection Time: 04/20/16  2:07 AM  Result Value Ref Range Status  Specimen Description ABSCESS  Final   Special Requests LEFT THIGH DEBRIDEMENT  Final   Gram Stain   Final    FEW WBC PRESENT,BOTH PMN AND MONONUCLEAR MODERATE GRAM POSITIVE COCCI IN PAIRS FEW GRAM NEGATIVE RODS    Culture CULTURE REINCUBATED FOR BETTER GROWTH  Final   Report Status PENDING  Incomplete    RADIOLOGY STUDIES/RESULTS: Dg Chest 2 View  Result Date: 04/19/2016 CLINICAL DATA:  large abscess on left groin. Began on scrotum and has spread to leg. History of A-FIB, DM, HTN. Pt refused to stand. EXAM: CHEST  2 VIEW COMPARISON:  07/16/2011 FINDINGS: The heart size and mediastinal contours are within normal limits. Both lungs are clear. The visualized skeletal structures are unremarkable. IMPRESSION: No active cardiopulmonary disease. Electronically Signed   By: Nolon Nations M.D.   On: 04/19/2016 18:22   Ct Pelvis W Contrast  Result Date: 04/19/2016 CLINICAL DATA:  66 year old male with left groin infection and abscess beginning 2 weeks ago. Diabetic. Initial encounter. EXAM: CT PELVIS WITH CONTRAST TECHNIQUE: Multidetector CT imaging of the pelvis was performed using the standard protocol following the bolus administration of intravenous contrast. CONTRAST:  77mL ISOVUE-300 IOPAMIDOL (ISOVUE-300) INJECTION 61% COMPARISON:  None. FINDINGS: Urinary Tract: Distal ureters and urinary bladder appear unremarkable. Kidneys not included. Bowel: Negative visible large bowel, it including a partially visible normal retrocecal appendix. Negative visible small bowel. Vascular/Lymphatic: Suboptimal intravascular contrast in part related to decreased contrast dose in the setting of renal insufficiency. Mild femoral  artery calcified peripheral vascular disease. No right inguinal or right iliac lymphadenopathy. See left side node details below. Reproductive: The scrotum itself appears to be spared except for superficial skin thickening. There is no scrotal gas. However, there is a small area of scrotal fat stranding on the right side (coronal image 36). Other: Left inguinal subcutaneous gas and confluent fat stranding beginning just below the left inguinal ligament and continuing distally into the anterior and medial thigh. Clustered gas near the skin surface on series 2, image 69. Reactive appearing left inguinal and left iliac lymph nodes, measuring individually up to 17 mm short axis. No cystic or necrotic nodes. No drainable fluid collection. Musculoskeletal: Motion artifact at both hip joints. Moderate to severe chronic degenerative changes in the visible lumbar spine. No acute osseous abnormality identified. IMPRESSION: 1. Soft tissue infection suspicious for Necrotizing Fasciitis or infection by a gas-forming organism. Inflammation with abundant subcutaneous gas extending from the left groin at the level of the inguinal ligament tracking into the anterior and medial left thigh. No associated abscess or fluid collection. 2. The scrotum appears largely spared except for skin thickening and a small area of right superior scrotal subcutaneous inflammation (series 4, image 36). 3. Reactive left inguinal and iliac station lymph nodes with no cystic or necrotic nodes. 4. No involvement of the anatomic pelvis. 5. Study discussed by telephone with Dr. Vonna Kotyk LONG on 04/19/2016 at 15:52 . Electronically Signed   By: Genevie Ann M.D.   On: 04/19/2016 15:54     LOS: 4 days   Oren Binet, MD  [facility] Pager:[phone]  If 7PM-7AM, please contact night-coverage [email or web address] Password [facility] 04/23/2016, 9:45 AM

## 2016-04-23 NOTE — Progress Notes (Signed)
Pharmacy - Allergy History and Antibiotics  Discussed with patient who states PCN was a childhood allergy and parents never told him reaction. He has taken ampicillin with no problems in the past. I feel we could challenge a PCN and he does not have true allergy. I have removed PCN from allergy list.  Consider changing IV antibiotics to Augmentin orally. Another option might be Ceftin + Flagyl.   Renold Genta, PharmD, BCPS Clinical Pharmacist Phone for today - [place] - [phone] 04/23/2016 12:09 PM

## 2016-04-23 NOTE — Progress Notes (Signed)
Nutrition Follow-up  DOCUMENTATION CODES:   Morbid obesity  INTERVENTION:   -Continue MVI daily -Continue 30 ml Prostat BID, each supplement provides 100 kcals and 15 grams   NUTRITION DIAGNOSIS:   Increased nutrient needs related to chronic illness as evidenced by estimated needs.  Ongoing  GOAL:   Patient will meet greater than or equal to 90% of their needs  Progressing  MONITOR:   PO intake, Supplement acceptance, Diet advancement, Labs, Weight trends, Skin, I & O's  REASON FOR ASSESSMENT:   Malnutrition Screening Tool    ASSESSMENT:   Kevin Goodman is a 67 y.o. male with medical history significant of HTN, DM, afib, gout, and morbid obesity presenting with left groin infection that started this weekend.  He thought he had a little boil in his left upper thigh/groin region and it started getting bigger and bigger.  It has been "draining a whole lot of pus and red blood and jelly-looking stuff, it's awful nasty."   S/p Procedure on 04/19/16: excisional debridement of left thigh, 18cm x 9cm area involving full thickness skin and soft tissue  Wound vac applied to lt thigh this AM.   Spoke with pt at bedside, who reports great appetite. Meal completion 100%.   Pt voices that he continues to be motivated to pursue lifestyle changes to improve his health and glycemic control. He has not reviewed the education materials or videos yet, but reports he intends to later when his family members bring in his reading glasses. Pt was able to tell this RD that he should be consuming 60 grams of carbohydrate at each meal. RD had brief discussion about meal planning and carbohydrate counting with pt; encouraged him to review education resources prior to discharge.   Labs reviewed: CBGS: 166-241.  Diet Order:  Diet Carb Modified Fluid consistency: Thin; Room service appropriate? Yes  Skin:  Wound (see comment) (wound vac to full thickness wound on lt thigh, open wound rt  thigh)  Last BM:  04/18/16  Height:   Ht Readings from Last 1 Encounters:  04/19/16 6\' 1"  (1.854 m)    Weight:   Wt Readings from Last 1 Encounters:  04/19/16 (!) 372 lb 5.7 oz (168.9 kg)    Ideal Body Weight:  86.4 kg  BMI:  Body mass index is 49.13 kg/m.  Estimated Nutritional Needs:   Kcal:  2200-2400  Protein:  115-130 grams  Fluid:  >2.2 L  EDUCATION NEEDS:   Education needs addressed  Klarisa Barman A. Jimmye Norman, RD, LDN, CDE Pager: [phone] After hours Pager: [phone]

## 2016-04-23 NOTE — Care Management Note (Signed)
Case Management Note  Patient Details  Name: Kevin Goodman MRN: [id number] Date of Birth: 1950-03-21  Subjective/Objective:                    Action/Plan: Plan is to d/c on 04/24/2016 with home health services(RN), and wound vac. CM need to f/u with [name] health [phone]) to ensure orders received and fax d/c summary,( [facility], fax [phone]).  Expected Discharge Date:   04/25/2015      Expected Discharge Plan:  [name]  In-House Referral:     Discharge planning Services  CM Consult  Post Acute Care Choice:    Choice offered to:  Patient  DME Arranged:  Vac DME Agency:  [place]. (referral made to [place] @ [phone])  HH Arranged:  RN [place]:  [facility]  Status of Service:  In process, will continue to follow  If discussed at Long Length of Stay Meetings, dates discussed:    Additional Comments:  Sharin Mons, RN 04/23/2016, 11:12 AM

## 2016-04-24 LAB — GLUCOSE, CAPILLARY
GLUCOSE-CAPILLARY: 134 mg/dL — AB (ref 65–99)
Glucose-Capillary: 152 mg/dL — ABNORMAL HIGH (ref 65–99)
Glucose-Capillary: 146 mg/dL — ABNORMAL HIGH (ref 65–99)
Glucose-Capillary: 79 mg/dL (ref 65–99)

## 2016-04-24 LAB — CBC
HCT: 32.4 % — ABNORMAL LOW (ref 39.0–52.0)
Hemoglobin: 10.6 g/dL — ABNORMAL LOW (ref 13.0–17.0)
MCH: 30 pg (ref 26.0–34.0)
MCHC: 32.7 g/dL (ref 30.0–36.0)
MCV: 91.8 fL (ref 78.0–100.0)
PLATELETS: 358 10*3/uL (ref 150–400)
RBC: 3.53 MIL/uL — AB (ref 4.22–5.81)
RDW: 14.5 % (ref 11.5–15.5)
WBC: 17.6 10*3/uL — AB (ref 4.0–10.5)

## 2016-04-24 LAB — CULTURE, BLOOD (ROUTINE X 2)
Culture: NO GROWTH
Culture: NO GROWTH

## 2016-04-24 MED ORDER — FREESTYLE SYSTEM KIT: 1.0000 | PACK | Freq: Three times a day (TID) | 1 refills | Status: DC

## 2016-04-24 MED ORDER — HYDROCODONE-ACETAMINOPHEN 5-325 MG PO TABS: 1.0000 | ORAL_TABLET | Freq: Four times a day (QID) | ORAL | 0 refills | Status: DC | PRN

## 2016-04-24 MED ORDER — GLUCOSE BLOOD VI STRP: ORAL_STRIP | 0 refills | Status: DC

## 2016-04-24 MED ORDER — INSULIN PEN NEEDLE 32G X 8 MM MISC: 0 refills | Status: DC

## 2016-04-24 MED ORDER — INSULIN GLARGINE 100 UNIT/ML SOLOSTAR PEN: 30.0000 [IU] | PEN_INJECTOR | Freq: Every day | SUBCUTANEOUS | 0 refills | Status: DC

## 2016-04-24 MED ORDER — METRONIDAZOLE 500 MG PO TABS: 500.0000 mg | ORAL_TABLET | Freq: Three times a day (TID) | ORAL | 0 refills | Status: AC

## 2016-04-24 MED ORDER — FREESTYLE LANCETS MISC: 0 refills | Status: DC

## 2016-04-24 MED ORDER — ASPIRIN 81 MG PO TBEC: 81.0000 mg | DELAYED_RELEASE_TABLET | Freq: Every day | ORAL | Status: DC

## 2016-04-24 MED ORDER — PRO-STAT SUGAR FREE PO LIQD: 30.0000 mL | Freq: Two times a day (BID) | ORAL | 0 refills | Status: DC

## 2016-04-24 MED ORDER — INSULIN ASPART 100 UNIT/ML FLEXPEN: PEN_INJECTOR | SUBCUTANEOUS | 0 refills | Status: DC

## 2016-04-24 MED ORDER — CEFUROXIME AXETIL 500 MG PO TABS
500.0000 mg | ORAL_TABLET | Freq: Two times a day (BID) | ORAL | 0 refills | Status: AC
Start: 2016-04-24 — End: 2016-05-04

## 2016-04-24 NOTE — Progress Notes (Signed)
Pt. Has orders to go home today, this RN went to change wound vac over to pt. Home vac with advance, but tubing is not compatible.  Paged [place] with return called and informed that [place] will have to come out to pt. Home tonight to replaced vac. Will need to call surgeon to see if ok to do a wet to dry dressing for pt. To go home.  RN paged RNCM first to make sure that H/H RN would be able to come out tonight to replace vac.  Return call from Brandt Loosen, RN CM, who stated that [place] would have to send a nurse to the hospital to change the vac over to their machine.  Paged PA with surgery as well to inform of the plan. Will continue to monitor, await for surgery to call back and assist with discharge planning.  Alphonzo Lemmings, RN

## 2016-04-24 NOTE — Progress Notes (Signed)
Patient ID: Kevin Goodman, male   DOB: 1950-12-31, 66 y.o.   MRN: [id number]  [facility] Surgery Progress Note  4 Days Post-Op  Subjective: Patient feeling well this morning. Tolerating vac, had 600cc serosanguinous output since yesterday. States that scrotal abscess is still draining, less painful today.  Objective: Vital signs in last 24 hours: Temp:  [98.7 F (37.1 C)-99.1 F (37.3 C)] 98.7 F (37.1 C) (02/17 0518) Pulse Rate:  [81-87] 81 (02/17 0518) Resp:  [18-20] 18 (02/17 0518) BP: (129-166)/(61-85) 166/85 (02/17 0518) SpO2:  [97 %-100 %] 99 % (02/17 0518) Last BM Date: 04/22/16  Intake/Output from previous day: 02/16 0701 - 02/17 0700 In: 240 [P.O.:240] Out: 1430 [Urine:830; Drains:600] Intake/Output this shift: No intake/output data recorded.  PE: Gen: Alert, NAD, pleasant Pulm: Effort normal Abd: Soft, NT/ND Left thigh: anterior/proximal thigh wound with wound vac in place Scrotum: right side abscess with trace purulent drainage, no fluctuance or erythema noted  Lab Results:   Recent Labs  04/23/16 0455 04/24/16 0604  WBC 17.9* 17.6*  HGB 10.6* 10.6*  HCT 32.0* 32.4*  PLT 343 358   BMET  Recent Labs  04/22/16 0832  NA 137  K 3.9  CL 104  CO2 23  GLUCOSE 206*  BUN 8  CREATININE 1.11  CALCIUM 8.4*   PT/INR No results for input(s): LABPROT, INR in the last 72 hours. CMP     Component Value Date/Time   NA 137 04/22/2016 0832   K 3.9 04/22/2016 0832   CL 104 04/22/2016 0832   CO2 23 04/22/2016 0832   GLUCOSE 206 (H) 04/22/2016 0832   BUN 8 04/22/2016 0832   CREATININE 1.11 04/22/2016 0832   CALCIUM 8.4 (L) 04/22/2016 0832   PROT 7.3 04/19/2016 1153   ALBUMIN 2.9 (L) 04/19/2016 1153   AST 28 04/19/2016 1153   ALT 25 04/19/2016 1153   ALKPHOS 60 04/19/2016 1153   BILITOT 0.8 04/19/2016 1153   GFRNONAA >60 04/22/2016 0832   GFRAA >60 04/22/2016 0832   Lipase  No results found for: LIPASE     Studies/Results: No  results found.  Anti-infectives: Anti-infectives    Start     Dose/Rate Route Frequency Ordered Stop   04/23/16 1700  cefUROXime (CEFTIN) tablet 500 mg     500 mg Oral 2 times daily with meals 04/23/16 1602 05/04/16 0759   04/23/16 1615  metroNIDAZOLE (FLAGYL) tablet 500 mg     500 mg Oral Every 8 hours 04/23/16 1602 05/04/16 0559   04/20/16 1300  vancomycin (VANCOCIN) 1,750 mg in sodium chloride 0.9 % 500 mL IVPB  Status:  Discontinued     1,750 mg 250 mL/hr over 120 Minutes Intravenous Every 24 hours 04/19/16 1713 04/23/16 1602   04/19/16 2100  aztreonam (AZACTAM) 1 g in dextrose 5 % 50 mL IVPB  Status:  Discontinued     1 g 100 mL/hr over 30 Minutes Intravenous Every 8 hours 04/19/16 1713 04/23/16 1602   04/19/16 2000  clindamycin (CLEOCIN) IVPB 900 mg  Status:  Discontinued     900 mg 100 mL/hr over 30 Minutes Intravenous Every 8 hours 04/19/16 1911 04/21/16 0843   04/19/16 1300  metroNIDAZOLE (FLAGYL) IVPB 500 mg  Status:  Discontinued     500 mg 100 mL/hr over 60 Minutes Intravenous Every 8 hours 04/19/16 1253 04/19/16 1944   04/19/16 1300  vancomycin (VANCOCIN) 2,000 mg in sodium chloride 0.9 % 500 mL IVPB     2,000 mg 250 mL/hr  over 120 Minutes Intravenous  Once 04/19/16 1253 04/19/16 1533   04/19/16 1245  aztreonam (AZACTAM) 2 g in dextrose 5 % 50 mL IVPB     2 g 100 mL/hr over 30 Minutes Intravenous  Once 04/19/16 1239 04/19/16 1324   04/19/16 1245  vancomycin (VANCOCIN) IVPB 1000 mg/200 mL premix  Status:  Discontinued     1,000 mg 200 mL/hr over 60 Minutes Intravenous  Once 04/19/16 1239 04/19/16 1354       Assessment/Plan Necrotizing fasciitis Excisional debridement of left thigh, 18cm x 9cm area involving full thickness skin and soft tissue2/13 Luke Kinsinger - POD 4 - culture: VIRIDANS STREPTOCOCCUS; sensitive to erythromycin, levofloxacin, and vancomycin - surgical path: EXTENSIVE SUPPURATIVE INFLAMMATION WITH NECROSIS AND ABSCESS, ENLARGED LYMPH NODE WITH  REACTIVE CHANGES - WBC slowly trending down, afebrile  Poorly controlled DM - A1C 14.1 H/o A fib  ID - clinda 12/12>>2/14, vanco 2/12>>2/16, azactam 2/12>>2/16, ceftin 2/16>>, flagyl 2/16>> FEN - carb modified diet  Plan - patient transitioned to PO ceftin/flagyl yesterday. Continue vac to LLE. Patient will follow-up with Dr. Kieth Brightly in about 2 weeks.  Right scrotal abscess s/p bedside cleaning 2/16 draining appropriately, improving. Continue dry dressing as needed. Ok to discharge from surgical standpoint.    LOS: 5 days    Jerrye Beavers , [facility] Surgery 04/24/2016, 8:58 AM Pager: [phone] Consults: [phone] Mon-Fri 7:00 am-4:30 pm Sat-Sun 7:00 am-11:30 am

## 2016-04-24 NOTE — Care Management Note (Signed)
Case Management Note  Patient Details  Name: Kevin Goodman MRN: [id number] Date of Birth: 1950/09/07  Subjective/Objective:  Called [facility] and confirmed [facility] for Wound Vac (M-W-F) with [name]. Faxed Discharge Summary to [phone].  Spoke with pt to confirm receipt of RW, 3n1 and Wound Vac. No further CM needs at present.                   Action/Plan: Anticipate discharge home today. No further CM needs but will be available should additional discharge needs arise.   Expected Discharge Date:  04/24/16               Expected Discharge Plan:  [name]  In-House Referral:  NA  Discharge planning Services  CM Consult  Post Acute Care Choice:    Choice offered to:  Patient  DME Arranged:  [place] DME Agency:  [place]. (referral made to [place] @ [phone])  HH Arranged:  RN [place]:  [facility]  Status of Service:  Completed, signed off  If discussed at [place] of Stay Meetings, dates discussed:    Additional Comments:  Delrae Sawyers, RN 04/24/2016, 11:42 AM

## 2016-04-24 NOTE — Progress Notes (Signed)
Call received from [facility], RN CM, stating that [facility] is unable to have a nurse come in tonight to placed their wound vac and [facility] will not start until Monday.  Pt. Will not be able to go home with wet to dry till Monday.  Re-paged Dr. Grandville Silos with return call and stated need to call attending to see if they wanted to keep pt. Till Monday.  Paged Dr. Sloan Leiter with return call and informed of problem.  Will keep pt. Tonight and may can discharge tomorrow evening. Will continue to care for pt. And assist with discharge planning.  Informed pt. And pt. Son of plans.  Alphonzo Lemmings, RN

## 2016-04-24 NOTE — Discharge Summary (Signed)
PATIENT DETAILS Name: Kevin Goodman Age: 66 y.o. Sex: male Date of Birth: Aug 10, 1950 MRN: [id number]. Admitting Physician: Karmen Bongo, MD WGY:[id number] Jerilynn Mages, MD  Admit Date: 04/19/2016 Discharge date: 04/24/2016  Recommendations for Outpatient Follow-up:  1. Follow up with PCP in 1-2 weeks 2. Please obtain BMP/CBC in one week 3. Optimize glycemic control (A1c 14)-started on insulin 4. Please ensure follow-up with general surgery  Admitted From:  Home  Disposition: Home with home health [place]:  Yes  Equipment/Devices: Being discharged with wound VAC  Discharge Condition: Stable  CODE STATUS: FULL CODE  Diet recommendation:  Heart Healthy / Carb Modified   Brief Summary: See H&P, Labs, Consult and Test reports for all details in brief, Patient is a 66 y.o. male with history of morbid obesity, hypertension, diabetes, atrial fibrillation not on anticoagulation who presented with necrotizing infection of his left groin/left thigh area. He was seen by general surgery and underwent incision and drainage. See below for further details.  Brief Hospital Course: Sepsis secondary to necrotizing fasciitis of left thigh/left groin area: Sepsis pathophysiology has resolved-patient is currently status post extensive excisional debridement on 2/13. Continues to have some mild leukocytosis that seems to be slowly down trending- no indication of infection at the surgical site-suspect leukocytosis from ongoing inflammation rather than active infection. Also has a very small pustule on the right scrotal area that was drained at bedside by general surgery on 2/16. Perioperative culture shows viridans streptococci-initially on broad-spectrum antibiotics-she has not been narrowed down to Ceftin and Flagyl which will be continued for 10 more days. No further recommendations from general surgery, okay to discharge. Patient being discharged in a stable manner with wound VAC  in place. Home health services have been arranged.   Uncontrolled type 2 diabetes with hyperglycemia: CBGs relatively stable CBGs-continue Lantus 30 units and SSI on discharge. Resume oral hypoglycemics on discharge. A1c 14. Patient was educated about insulin-provided insulin teaching by RN. Diabetic coordinator was also consulted. He seems to be very comfortable administrating insulin by himself. We have also talked about recognizing symptoms of hypoglycemia and appropriate intervention if this happens.   Hypertension: Continue metoprolol-blood pressure currently controlled.  Acute renal failure: Likely hemodynamically mediated in a setting of sepsis. Resolved with IV fluids and other supportive measures.  History of paroxysmal atrial fibrillation: Maintaining sinus rhythm-chadsvasc is 2. Per patient, previously he was on anticoagulation with warfarin, apparently this was discontinued many years ago his PCP. Start aspirin, patient will discuss with his PCP in the outpatient setting regarding reinitiation of anticoagulation post discharge  Morbid obesity: Counseled regarding importance of weight loss.  Procedures/Studies: 2/13 excisional debridement of left thigh, 18cm x 9cm area involving full thickness skin and soft tissue  Discharge Diagnoses:  Principal Problem:   Sepsis ([place]) Active Problems:   Atrial fibrillation ([place])   Morbid obesity ([place])   Type 2 diabetes mellitus ([place])   Abscess   Acute renal failure ([place])   Acute bronchitis  Discharge Instructions: Activity:  As tolerated with Full fall precautions use walker/cane & assistance as needed   Discharge Instructions    Ambulatory referral to Nutrition and Diabetic Education    Complete by:  As directed    Patient lives in [place] so education in [place] would be most beneficial. A1C 14.1% on 04/21/16 and will be discharging new to insulin.   Call MD for:  redness, tenderness, or signs of infection (pain,  swelling, redness, odor or green/yellow discharge around  incision site)    Complete by:  As directed    Call MD for:  severe uncontrolled pain    Complete by:  As directed    Diet - low sodium heart healthy    Complete by:  As directed    Diet Carb Modified    Complete by:  As directed    Discharge instructions    Complete by:  As directed    Follow with Primary MD  [name],RICHARD M, MD in 1 week  Follow with general surgery as instructed    You have been started on insulin-please check his sugars 3-4 times a day, keep a record of these readings and present it to your primary care M.D. at your next follow-up.  Please get a complete blood count and chemistry panel checked by your Primary MD at your next visit, and again as instructed by your Primary MD.  Get Medicines reviewed and adjusted: Please take all your medications with you for your next visit with your Primary MD  Laboratory/radiological data: Please request your Primary MD to go over all hospital tests and procedure/radiological results at the follow up, please ask your Primary MD to get all Hospital records sent to his/her office.  In some cases, they will be blood work, cultures and biopsy results pending at the time of your discharge. Please request that your primary care M.D. follows up on these results.  Also Note the following: If you experience worsening of your admission symptoms, develop shortness of breath, life threatening emergency, suicidal or homicidal thoughts you must seek medical attention immediately by calling 911 or calling your MD immediately  if symptoms less severe.  You must read complete instructions/literature along with all the possible adverse reactions/side effects for all the Medicines you take and that have been prescribed to you. Take any new Medicines after you have completely understood and accpet all the possible adverse reactions/side effects.   Do not drive when taking Pain medications or  sleeping medications (Benzodaizepines)  Do not take more than prescribed Pain, Sleep and Anxiety Medications. It is not advisable to combine anxiety,sleep and pain medications without talking with your primary care practitioner  Special Instructions: If you have smoked or chewed Tobacco  in the last 2 yrs please stop smoking, stop any regular Alcohol  and or any Recreational drug use.  Wear Seat belts while driving.  Please note: You were cared for by a hospitalist during your hospital stay. Once you are discharged, your primary care physician will handle any further medical issues. Please note that NO REFILLS for any discharge medications will be authorized once you are discharged, as it is imperative that you return to your primary care physician (or establish a relationship with a primary care physician if you do not have one) for your post hospital discharge needs so that they can reassess your need for medications and monitor your lab values.   Increase activity slowly    Complete by:  As directed      Allergies as of 04/24/2016      Reactions   Shrimp [shellfish Allergy] Anaphylaxis      Medication List    TAKE these medications   allopurinol 300 MG tablet Commonly known as:  ZYLOPRIM Take 300 mg by mouth daily.   aspirin 81 MG EC tablet Take 1 tablet (81 mg total) by mouth daily.   cefUROXime 500 MG tablet Commonly known as:  CEFTIN Take 1 tablet (500 mg total) by mouth 2 (two)  times daily with a meal.   clobetasol cream 0.05 % Commonly known as:  TEMOVATE Apply 1 application topically 2 (two) times daily as needed.   feeding supplement (PRO-STAT SUGAR FREE 64) Liqd Take 30 mLs by mouth 2 (two) times daily.   freestyle lancets Use as instructed   glucose blood test strip Commonly known as:  FREESTYLE TEST STRIPS Use as instructed   glucose monitoring kit monitoring kit 1 each by Does not apply route 4 (four) times daily - after meals and at bedtime. 1 month  Diabetic Testing Supplies for QAC-QHS accuchecks.   HYDROcodone-acetaminophen 5-325 MG tablet Commonly known as:  NORCO/VICODIN Take 1 tablet by mouth every 6 (six) hours as needed for moderate pain.   insulin aspart 100 UNIT/ML FlexPen Commonly known as:  NOVOLOG FLEXPEN CBG < 70: implement hypoglycemia protocol-call MD CBG 70 - 120: 0 units CBG 121 - 150: 2 units CBG 151 - 200: 3 units CBG 201 - 250: 5 units CBG 251 - 300: 8 units CBG 301 - 350: 11 units CBG 351 - 400: 15 units CBG > 400: call MD   Insulin Glargine 100 UNIT/ML Solostar Pen Commonly known as:  LANTUS Inject 30 Units into the skin daily at 10 pm.   Insulin Pen Needle 32G X 8 MM Misc Use as directed   metFORMIN 500 MG tablet Commonly known as:  GLUCOPHAGE Take 500 mg by mouth 2 (two) times daily with a meal.   metoprolol succinate 50 MG 24 hr tablet Commonly known as:  TOPROL-XL Take 50 mg by mouth daily. Take with or immediately following a meal.   metroNIDAZOLE 500 MG tablet Commonly known as:  FLAGYL Take 1 tablet (500 mg total) by mouth every 8 (eight) hours.            Durable Medical Equipment        Start     Ordered   04/22/16 1156  For home use only DME Bedside commode  Once    Comments:  Weight 372 pounds, will need bariatric  Question:  Patient needs a bedside commode to treat with the following condition  Answer:  Weakness   04/22/16 1155   04/22/16 1155  For home use only DME Walker rolling  Once    Comments:  Weight 372 pounds, will need bariatric  Question:  Patient needs a walker to treat with the following condition  Answer:  Weakness   04/22/16 [place] Follow up.   Why:  For home health nurse Contact information: [place] Follow up.   Why:  Bariatric walker and BSC to be delivered to room prior to discharge Contact information: [address]  [address] [phone]        Mickeal Skinner, MD. Go on 05/06/2016.   Specialty:  General Surgery Why:  Your appointment is 05/06/2016 at 11:45AM. Please arrive 30 minutes prior to your appointment to fill out paperwork and to check in. Contact information: [address] [place] [address] [phone]        Inc[facility] - Dme [facility] Follow up.   Why:  wound vac arranged Contact information: [place] [address] [phone]          Allergies  Allergen Reactions  . Shrimp [Shellfish Allergy] Anaphylaxis    Consultations:  general surgery  Other Procedures/Studies: Dg Chest 2 View  Result Date: 04/19/2016 CLINICAL DATA:  large abscess on left groin. Began on scrotum and has spread to leg. History of A-FIB, DM, HTN. Pt refused to stand. EXAM: CHEST  2 VIEW COMPARISON:  07/16/2011 FINDINGS: The heart size and mediastinal contours are within normal limits. Both lungs are clear. The visualized skeletal structures are unremarkable. IMPRESSION: No active cardiopulmonary disease. Electronically Signed   By: Nolon Nations M.D.   On: 04/19/2016 18:22   Ct Pelvis W Contrast  Result Date: 04/19/2016 CLINICAL DATA:  66 year old male with left groin infection and abscess beginning 2 weeks ago. Diabetic. Initial encounter. EXAM: CT PELVIS WITH CONTRAST TECHNIQUE: Multidetector CT imaging of the pelvis was performed using the standard protocol following the bolus administration of intravenous contrast. CONTRAST:  103m ISOVUE-300 IOPAMIDOL (ISOVUE-300) INJECTION 61% COMPARISON:  None. FINDINGS: Urinary Tract: Distal ureters and urinary bladder appear unremarkable. Kidneys not included. Bowel: Negative visible large bowel, it including a partially visible normal retrocecal appendix. Negative visible small bowel. Vascular/Lymphatic: Suboptimal intravascular contrast in part related to decreased contrast dose in the setting of renal insufficiency.  Mild femoral artery calcified peripheral vascular disease. No right inguinal or right iliac lymphadenopathy. See left side node details below. Reproductive: The scrotum itself appears to be spared except for superficial skin thickening. There is no scrotal gas. However, there is a small area of scrotal fat stranding on the right side (coronal image 36). Other: Left inguinal subcutaneous gas and confluent fat stranding beginning just below the left inguinal ligament and continuing distally into the anterior and medial thigh. Clustered gas near the skin surface on series 2, image 69. Reactive appearing left inguinal and left iliac lymph nodes, measuring individually up to 17 mm short axis. No cystic or necrotic nodes. No drainable fluid collection. Musculoskeletal: Motion artifact at both hip joints. Moderate to severe chronic degenerative changes in the visible lumbar spine. No acute osseous abnormality identified. IMPRESSION: 1. Soft tissue infection suspicious for Necrotizing Fasciitis or infection by a gas-forming organism. Inflammation with abundant subcutaneous gas extending from the left groin at the level of the inguinal ligament tracking into the anterior and medial left thigh. No associated abscess or fluid collection. 2. The scrotum appears largely spared except for skin thickening and a small area of right superior scrotal subcutaneous inflammation (series 4, image 36). 3. Reactive left inguinal and iliac station lymph nodes with no cystic or necrotic nodes. 4. No involvement of the anatomic pelvis. 5. Study discussed by telephone with Dr. JVonna KotykLONG on 04/19/2016 at 15:52 . Electronically Signed   By: HGenevie AnnM.D.   On: 04/19/2016 15:54   TODAY-DAY OF DISCHARGE:  Subjective:   Kevin Sudanotoday has no headache,no chest abdominal pain,no new weakness tingling or numbness, feels much better wants to go home today.   Objective:   Blood pressure (!) 166/85, pulse 81, temperature 98.7 F (37.1  C), temperature source Oral, resp. rate 18, height '6\' 1"'  (1.854 m), weight (!) 168.9 kg (372 lb 5.7 oz), SpO2 99 %.  Intake/Output Summary (Last 24 hours) at 04/24/16 1108 Last data filed at 04/24/16 0900  Gross per 24 hour  Intake              720 ml  Output             1430 ml  Net             -710 ml   Filed  Weights   04/19/16 1200 04/19/16 2019  Weight: (!) 163.7 kg (361 lb) (!) 168.9 kg (372 lb 5.7 oz)    Exam: Awake Alert, Oriented *3, No new F.N deficits, Normal affect Kevin Goodman,PERRAL Supple Neck,No JVD, No cervical lymphadenopathy appriciated.  Symmetrical Chest wall movement, Good air movement bilaterally, CTAB RRR,No Gallops,Rubs or new Murmurs, No Parasternal Heave +ve B.Sounds, Abd Soft, Non tender, No organomegaly appriciated, No rebound -guarding or rigidity. No Cyanosis, Clubbing or edema, No new Rash or bruise   PERTINENT RADIOLOGIC STUDIES: Dg Chest 2 View  Result Date: 04/19/2016 CLINICAL DATA:  large abscess on left groin. Began on scrotum and has spread to leg. History of A-FIB, DM, HTN. Pt refused to stand. EXAM: CHEST  2 VIEW COMPARISON:  07/16/2011 FINDINGS: The heart size and mediastinal contours are within normal limits. Both lungs are clear. The visualized skeletal structures are unremarkable. IMPRESSION: No active cardiopulmonary disease. Electronically Signed   By: Nolon Nations M.D.   On: 04/19/2016 18:22   Ct Pelvis W Contrast  Result Date: 04/19/2016 CLINICAL DATA:  66 year old male with left groin infection and abscess beginning 2 weeks ago. Diabetic. Initial encounter. EXAM: CT PELVIS WITH CONTRAST TECHNIQUE: Multidetector CT imaging of the pelvis was performed using the standard protocol following the bolus administration of intravenous contrast. CONTRAST:  18m ISOVUE-300 IOPAMIDOL (ISOVUE-300) INJECTION 61% COMPARISON:  None. FINDINGS: Urinary Tract: Distal ureters and urinary bladder appear unremarkable. Kidneys not included. Bowel: Negative  visible large bowel, it including a partially visible normal retrocecal appendix. Negative visible small bowel. Vascular/Lymphatic: Suboptimal intravascular contrast in part related to decreased contrast dose in the setting of renal insufficiency. Mild femoral artery calcified peripheral vascular disease. No right inguinal or right iliac lymphadenopathy. See left side node details below. Reproductive: The scrotum itself appears to be spared except for superficial skin thickening. There is no scrotal gas. However, there is a small area of scrotal fat stranding on the right side (coronal image 36). Other: Left inguinal subcutaneous gas and confluent fat stranding beginning just below the left inguinal ligament and continuing distally into the anterior and medial thigh. Clustered gas near the skin surface on series 2, image 69. Reactive appearing left inguinal and left iliac lymph nodes, measuring individually up to 17 mm short axis. No cystic or necrotic nodes. No drainable fluid collection. Musculoskeletal: Motion artifact at both hip joints. Moderate to severe chronic degenerative changes in the visible lumbar spine. No acute osseous abnormality identified. IMPRESSION: 1. Soft tissue infection suspicious for Necrotizing Fasciitis or infection by a gas-forming organism. Inflammation with abundant subcutaneous gas extending from the left groin at the level of the inguinal ligament tracking into the anterior and medial left thigh. No associated abscess or fluid collection. 2. The scrotum appears largely spared except for skin thickening and a small area of right superior scrotal subcutaneous inflammation (series 4, image 36). 3. Reactive left inguinal and iliac station lymph nodes with no cystic or necrotic nodes. 4. No involvement of the anatomic pelvis. 5. Study discussed by telephone with Dr. JVonna KotykLONG on 04/19/2016 at 15:52 . Electronically Signed   By: HGenevie AnnM.D.   On: 04/19/2016 15:54     PERTINENT LAB  RESULTS: CBC:  Recent Labs  04/23/16 0455 04/24/16 0604  WBC 17.9* 17.6*  HGB 10.6* 10.6*  HCT 32.0* 32.4*  PLT 343 358   CMET CMP     Component Value Date/Time   NA 137 04/22/2016 0832   K 3.9 04/22/2016 06553  CL 104 04/22/2016 0832   CO2 23 04/22/2016 0832   GLUCOSE 206 (H) 04/22/2016 0832   BUN 8 04/22/2016 0832   CREATININE 1.11 04/22/2016 0832   CALCIUM 8.4 (L) 04/22/2016 0832   PROT 7.3 04/19/2016 1153   ALBUMIN 2.9 (L) 04/19/2016 1153   AST 28 04/19/2016 1153   ALT 25 04/19/2016 1153   ALKPHOS 60 04/19/2016 1153   BILITOT 0.8 04/19/2016 1153   GFRNONAA >60 04/22/2016 0832   GFRAA >60 04/22/2016 0832    GFR Estimated Creatinine Clearance: 111.3 mL/min (by C-G formula based on SCr of 1.11 mg/dL). No results for input(s): LIPASE, AMYLASE in the last 72 hours. No results for input(s): CKTOTAL, CKMB, CKMBINDEX, TROPONINI in the last 72 hours. Invalid input(s): POCBNP No results for input(s): DDIMER in the last 72 hours. No results for input(s): HGBA1C in the last 72 hours. No results for input(s): CHOL, HDL, LDLCALC, TRIG, CHOLHDL, LDLDIRECT in the last 72 hours. No results for input(s): TSH, T4TOTAL, T3FREE, THYROIDAB in the last 72 hours.  Invalid input(s): FREET3 No results for input(s): VITAMINB12, FOLATE, FERRITIN, TIBC, IRON, RETICCTPCT in the last 72 hours. Coags: No results for input(s): INR in the last 72 hours.  Invalid input(s): PT Microbiology: Recent Results (from the past 240 hour(s))  Culture, blood (routine x 2)     Status: None   Collection Time: 04/19/16 12:30 PM  Result Value Ref [place] Status   Specimen Description BLOOD LEFT ARM  Final   Special Requests BOTTLES DRAWN AEROBIC AND ANAEROBIC 6 CC EACH  Final   Culture NO GROWTH 5 DAYS  Final   Report Status 04/24/2016 FINAL  Final  Culture, blood (routine x 2)     Status: None   Collection Time: 04/19/16 12:44 PM  Result Value Ref [place] Status   Specimen Description BLOOD LEFT HAND   Final   Special Requests BOTTLES DRAWN AEROBIC AND ANAEROBIC 6 CC EACH  Final   Culture NO GROWTH 5 DAYS  Final   Report Status 04/24/2016 FINAL  Final  Surgical pcr screen     Status: None   Collection Time: 04/19/16 11:28 PM  Result Value Ref Range Status   MRSA, PCR NEGATIVE NEGATIVE Final   Staphylococcus aureus NEGATIVE NEGATIVE Final    Comment:        The Xpert SA Assay (FDA approved for NASAL specimens in patients over 19 years of age), is one component of a comprehensive surveillance program.  Test performance has been validated by [facility] for patients greater than or equal to 45 year old. It is not intended to diagnose infection nor to guide or monitor treatment.   Aerobic/Anaerobic Culture (surgical/deep wound)     Status: None (Preliminary result)   Collection Time: 04/20/16  2:07 AM  Result Value Ref [place] Status   Specimen Description ABSCESS  Final   Special Requests LEFT THIGH DEBRIDEMENT  Final   Gram Stain   Final    FEW WBC PRESENT,BOTH PMN AND MONONUCLEAR MODERATE GRAM POSITIVE COCCI IN PAIRS FEW GRAM NEGATIVE RODS    Culture   Final    MODERATE VIRIDANS STREPTOCOCCUS NO ANAEROBES ISOLATED; CULTURE IN PROGRESS FOR 5 DAYS    Report Status PENDING  Incomplete   Organism ID, Bacteria VIRIDANS STREPTOCOCCUS  Final      Susceptibility   Viridans streptococcus - MIC*    ERYTHROMYCIN <=0.12 SENSITIVE Sensitive     LEVOFLOXACIN 1 SENSITIVE Sensitive     VANCOMYCIN 0.5 SENSITIVE Sensitive     *  MODERATE VIRIDANS STREPTOCOCCUS    FURTHER DISCHARGE INSTRUCTIONS:  Get Medicines reviewed and adjusted: Please take all your medications with you for your next visit with your Primary MD  Laboratory/radiological data: Please request your Primary MD to go over all hospital tests and procedure/radiological results at the follow up, please ask your Primary MD to get all Hospital records sent to his/her office.  In some cases, they will be blood work, cultures  and biopsy results pending at the time of your discharge. Please request that your primary care M.D. goes through all the records of your hospital data and follows up on these results.  Also Note the following: If you experience worsening of your admission symptoms, develop shortness of breath, life threatening emergency, suicidal or homicidal thoughts you must seek medical attention immediately by calling 911 or calling your MD immediately  if symptoms less severe.  You must read complete instructions/literature along with all the possible adverse reactions/side effects for all the Medicines you take and that have been prescribed to you. Take any new Medicines after you have completely understood and accpet all the possible adverse reactions/side effects.   Do not drive when taking Pain medications or sleeping medications (Benzodaizepines)  Do not take more than prescribed Pain, Sleep and Anxiety Medications. It is not advisable to combine anxiety,sleep and pain medications without talking with your primary care practitioner  Special Instructions: If you have smoked or chewed Tobacco  in the last 2 yrs please stop smoking, stop any regular Alcohol  and or any Recreational drug use.  Wear Seat belts while driving.  Please note: You were cared for by a hospitalist during your hospital stay. Once you are discharged, your primary care physician will handle any further medical issues. Please note that NO REFILLS for any discharge medications will be authorized once you are discharged, as it is imperative that you return to your primary care physician (or establish a relationship with a primary care physician if you do not have one) for your post hospital discharge needs so that they can reassess your need for medications and monitor your lab values.  Total Time spent coordinating discharge including counseling, education and face to face time equals  45  minutes.  SignedOren Binet 04/24/2016 11:08 AM

## 2016-04-24 NOTE — Progress Notes (Signed)
Paged Dr. Grandville Silos to see if it was ok to do a wet to dry dressing for the pt. To go home tonight and have H/H place vac tonight.  Return call for Dr. Grandville Silos and stated pt. Could go home with wet to dry dressing and the vac could be placed tonight or in the morning.  Will continue to monitor.  Alphonzo Lemmings, RN

## 2016-04-25 DIAGNOSIS — A419 Sepsis, unspecified organism: Principal | ICD-10-CM

## 2016-04-25 LAB — GLUCOSE, CAPILLARY: Glucose-Capillary: 143 mg/dL — ABNORMAL HIGH (ref 65–99)

## 2016-04-25 NOTE — Progress Notes (Signed)
Kevin Goodman discharged Home with rolling walker and H/H RN for wound vac per MD order.  Pt. Didn't want the [facility] that was ordered, RN  had [name] with advanced home care to pick Discharge instructions reviewed and discussed with the patient, all questions and concerns answered. Copy of instructions, care notes and scripts given to patient.  Allergies as of 04/25/2016      Reactions   Shrimp [shellfish Allergy] Anaphylaxis      Medication List    TAKE these medications   allopurinol 300 MG tablet Commonly known as:  ZYLOPRIM Take 300 mg by mouth daily.   aspirin 81 MG EC tablet Take 1 tablet (81 mg total) by mouth daily.   cefUROXime 500 MG tablet Commonly known as:  CEFTIN Take 1 tablet (500 mg total) by mouth 2 (two) times daily with a meal.   clobetasol cream 0.05 % Commonly known as:  TEMOVATE Apply 1 application topically 2 (two) times daily as needed.   feeding supplement (PRO-STAT SUGAR FREE 64) Liqd Take 30 mLs by mouth 2 (two) times daily.   freestyle lancets Use as instructed   glucose blood test strip Commonly known as:  FREESTYLE TEST STRIPS Use as instructed   glucose monitoring kit monitoring kit 1 each by Does not apply route 4 (four) times daily - after meals and at bedtime. 1 month Diabetic Testing Supplies for QAC-QHS accuchecks.   HYDROcodone-acetaminophen 5-325 MG tablet Commonly known as:  NORCO/VICODIN Take 1 tablet by mouth every 6 (six) hours as needed for moderate pain.   insulin aspart 100 UNIT/ML FlexPen Commonly known as:  NOVOLOG FLEXPEN CBG < 70: implement hypoglycemia protocol-call MD CBG 70 - 120: 0 units CBG 121 - 150: 2 units CBG 151 - 200: 3 units CBG 201 - 250: 5 units CBG 251 - 300: 8 units CBG 301 - 350: 11 units CBG 351 - 400: 15 units CBG > 400: call MD   Insulin Glargine 100 UNIT/ML Solostar Pen Commonly known as:  LANTUS Inject 30 Units into the skin daily at 10 pm.   Insulin Pen Needle 32G X 8 MM Misc Use as directed    metFORMIN 500 MG tablet Commonly known as:  GLUCOPHAGE Take 500 mg by mouth 2 (two) times daily with a meal.   metoprolol succinate 50 MG 24 hr tablet Commonly known as:  TOPROL-XL Take 50 mg by mouth daily. Take with or immediately following a meal.   metroNIDAZOLE 500 MG tablet Commonly known as:  FLAGYL Take 1 tablet (500 mg total) by mouth every 8 (eight) hours.            Durable Medical Equipment        Start     Ordered   04/22/16 1156  For home use only DME Bedside commode  Once    Comments:  Weight 372 pounds, will need bariatric  Question:  Patient needs a bedside commode to treat with the following condition  Answer:  Weakness   04/22/16 1155   04/22/16 1155  For home use only DME Walker rolling  Once    Comments:  Weight 372 pounds, will need bariatric  Question:  Patient needs a walker to treat with the following condition  Answer:  Weakness   04/22/16 1155      Patients skin is clean, dry, with surgical incision to right and left groin.  Wound vac removed from left groin and wet to dry dressing place and dry dressing to right  groin.  IV site discontinued and catheter remains intact. Site without signs and symptoms of complications. Dressing and pressure applied.  Patient escorted to car by NT in a wheelchair,  no distress noted upon discharge.  Kevin Goodman, [name] 04/25/2016 1:56 PM

## 2016-04-25 NOTE — Progress Notes (Signed)
[facility]                                                                                                                                                                         Patient Demographics  Kevin Goodman, is a 66 y.o. male  [id number]  [id number]  DOB - 1950/04/15  Admit date - 04/19/2016  Admitting Physician Karmen Bongo, MD  Outpatient Primary MD for the patient is Maricela Curet, MD  LOS - 6   Chief Complaint  Patient presents with  . Abscess        Assessment & Plan    Patient seen briefly today due for discharge soon per Discharge done yesterday by Dr Sloan Leiter, no further issues, Vital signs stable, patient feels fine.  Discussed with case management. Wound VAC arranged for today at home and has been confirmed by case management. Follow with general surgery outpatient as well along with PCP.    Medications  Scheduled Meds: . allopurinol  300 mg Oral Daily  . aspirin EC  81 mg Oral Daily  . cefUROXime  500 mg Oral BID WC  . feeding supplement (PRO-STAT SUGAR FREE 64)  30 mL Oral BID  . heparin subcutaneous  5,000 Units Subcutaneous Q8H  . insulin aspart  0-20 Units Subcutaneous TID WC  . insulin glargine  30 Units Subcutaneous QHS  . metoprolol succinate  50 mg Oral Daily  . metroNIDAZOLE  500 mg Oral Q8H  . multivitamin with minerals  1 tablet Oral Daily   Continuous Infusions: PRN Meds:.acetaminophen **OR** acetaminophen, albuterol, guaiFENesin-dextromethorphan, HYDROcodone-acetaminophen, morphine injection, ondansetron **OR** ondansetron (ZOFRAN) IV    Time Spent in minutes   10 minutes   Lala Lund K M.D on 04/25/2016 at 10:34 AM  Between 7am to 7pm - Pager - [phone]  After 7pm go to [email or web address] - password [email or web address]  And look for the night coverage person covering for me after hours  [facility] Office  [phone]     Subjective:   Kevin Goodman today has, No headache, No chest pain, No abdominal pain - No Nausea, No new weakness tingling or numbness, No Cough - SOB.    Objective:   Vitals:   04/24/16 0518 04/24/16 2111 04/25/16 0600 04/25/16 1025  BP: (!) 166/85 (!) 147/79 125/66 (!) 141/87  Pulse: 81 80 85 92  Resp: 18 18 18    Temp: 98.7 F (37.1 C) 98.8 F (37.1 C) 98.3 F (36.8 C)   TempSrc: Oral     SpO2: 99% 100% 98%   Weight:      Height:        Wt Readings from Last 3 Encounters:  04/19/16 (!) 168.9 kg (372 lb 5.7 oz)  07/29/11 (!) 163.7  kg (361 lb)  07/20/11 (!) 167.5 kg (369 lb 4.3 oz)     Intake/Output Summary (Last 24 hours) at 04/25/16 1034 Last data filed at 04/25/16 [id number]  Gross per 24 hour  Intake              240 ml  Output             1530 ml  Net            -1290 ml    Exam Awake Alert, Oriented X 3, No new F.N deficits, Normal affect [place].AT,PERRAL Supple Neck,No JVD, No cervical lymphadenopathy appriciated.  Symmetrical Chest wall movement, Good air movement bilaterally, CTAB RRR,No Gallops,Rubs or new Murmurs, No Parasternal Heave +ve B.Sounds, Abd Soft, Non tender, No organomegaly appriciated, No rebound - guarding or rigidity. No Cyanosis, Clubbing or edema, No new Rash or bruise, L groin W vac in place, site stable   Data Review

## 2016-04-25 NOTE — Care Management Note (Signed)
Case Management Note  Patient Details  Name: Kevin Goodman MRN: [id number] Date of Birth: 1950/09/17  Subjective/Objective:   Spoke to [facility] management to confirm no RN available to change hospital Wound Vac to home vac today( as is their policy) ; Called [name] with [facility] to confirm [facility] available today vs Monday for Home vac application if pt discharged today with wet to dry dressing; [name] the [facility] will be able to see the pt later today and apply the Vac. Pt and [name] spoke on the phone to coordinate and exchange phone numbers.  Patient confirmed he is able to get a ride home by noon. Spoke with Candiss Norse, MD to confirm no additional barriers to discharge.                  Action/Plan: No further CM needs at this time   Expected Discharge Date:  04/24/16               Expected Discharge Plan:  [place]  In-House Referral:  NA  Discharge planning Services  CM Consult  Post Acute Care Choice:    Choice offered to:  Patient  DME Arranged:  [place] rolling DME Agency:  [place]. (referral made to [place] @ [phone])  HH Arranged:  RN Miles:  [facility]  Status of Service:  Completed, signed off  If discussed at [place] of Stay Meetings, dates discussed:    Additional Comments:  Delrae Sawyers, RN 04/25/2016, 9:59 AM

## 2016-04-27 LAB — AEROBIC/ANAEROBIC CULTURE (SURGICAL/DEEP WOUND)

## 2016-04-27 LAB — AEROBIC/ANAEROBIC CULTURE W GRAM STAIN (SURGICAL/DEEP WOUND)

## 2016-05-11 ENCOUNTER — Ambulatory Visit: Payer: BC Managed Care – PPO | Admitting: Nutrition

## 2016-09-16 ENCOUNTER — Encounter: Payer: Self-pay | Admitting: Gastroenterology

## 2016-11-11 ENCOUNTER — Encounter: Payer: Self-pay | Admitting: Gastroenterology

## 2016-11-11 ENCOUNTER — Ambulatory Visit (INDEPENDENT_AMBULATORY_CARE_PROVIDER_SITE_OTHER): Payer: BC Managed Care – PPO | Admitting: Gastroenterology

## 2016-11-11 ENCOUNTER — Other Ambulatory Visit: Payer: Self-pay

## 2016-11-11 DIAGNOSIS — R1310 Dysphagia, unspecified: Secondary | ICD-10-CM

## 2016-11-11 DIAGNOSIS — Z1211 Encounter for screening for malignant neoplasm of colon: Secondary | ICD-10-CM

## 2016-11-11 MED ORDER — PEG 3350-KCL-NA BICARB-NACL 420 G PO SOLR: 4000.0000 mL | ORAL | 0 refills | Status: DC

## 2016-11-11 NOTE — Patient Instructions (Signed)
We have scheduled you for a colonoscopy, upper endoscopy and dilation with Dr. Gala Romney.  Further recommendations to follow!

## 2016-11-11 NOTE — Progress Notes (Signed)
Primary Care Physician:  Lucia Gaskins, MD Primary Gastroenterologist:  Dr. Gala Romney   Chief Complaint  Patient presents with  . Dysphagia    HPI:   Kevin Goodman is a 66 y.o. male presenting today at the request of his PCP secondary to dysphagia. No prior EGD or colonoscopy. He has been caring for his wife, who is on hospice, and states he has put his own health on hold for awhile. Now desires to pursue evaluation for GI concerns.   When eating sometimes (ham, peanut butter, peanuts), gets stuck in mid esophagus and lots of mucus starts coming up. Has to regurgitate to feel better.  Just started taking Protonix again a few months ago. Symptoms started around June 2018. Has lost weight but was "scared to eat" anything. Believes he lost from about the 380 range down to 350s.   Lower GI: when started taking metformin, will occasionally have diarrhea. Occasional scant low-volume hematochezia on paper after straining.   Past Medical History:  Diagnosis Date  . Atrial fibrillation ([place])   . Diabetes mellitus without complication ([place])   . GERD (gastroesophageal reflux disease)   . Gout   . Hypertension     Past Surgical History:  Procedure Laterality Date  . FINGER SURGERY     left ring  . INCISION AND DRAINAGE OF WOUND Left 04/20/2016   Procedure: LEFT THIGH DEBRIDEMENT;  Surgeon: Arta Bruce Kinsinger, MD;  Location: [place];  Service: General;  Laterality: Left;    Current Outpatient Prescriptions  Medication Sig Dispense Refill  . allopurinol (ZYLOPRIM) 300 MG tablet Take 300 mg by mouth daily.    Marland Kitchen aspirin EC 81 MG EC tablet Take 1 tablet (81 mg total) by mouth daily.    Marland Kitchen glucose blood (FREESTYLE TEST STRIPS) test strip Use as instructed 100 each 0  . glucose monitoring kit (FREESTYLE) monitoring kit 1 each by Does not apply route 4 (four) times daily - after meals and at bedtime. 1 month Diabetic Testing Supplies for QAC-QHS accuchecks. 1 each 1  . insulin aspart  (NOVOLOG FLEXPEN) 100 UNIT/ML FlexPen CBG < 70: implement hypoglycemia protocol-call MD CBG 70 - 120: 0 units CBG 121 - 150: 2 units CBG 151 - 200: 3 units CBG 201 - 250: 5 units CBG 251 - 300: 8 units CBG 301 - 350: 11 units CBG 351 - 400: 15 units CBG > 400: call MD 15 mL 0  . Insulin Glargine (LANTUS) 100 UNIT/ML Solostar Pen Inject 30 Units into the skin daily at 10 pm. 15 mL 0  . Insulin Pen Needle 32G X 8 MM MISC Use as directed 100 each 0  . Lancets (FREESTYLE) lancets Use as instructed 100 each 0  . metFORMIN (GLUCOPHAGE) 500 MG tablet Take 500 mg by mouth 2 (two) times daily with a meal.    . metoprolol succinate (TOPROL-XL) 50 MG 24 hr tablet Take 50 mg by mouth daily. Take with or immediately following a meal.    . pantoprazole (PROTONIX) 40 MG tablet Take 40 mg by mouth daily.     No current facility-administered medications for this visit.     Allergies as of 11/11/2016 - Review Complete 11/11/2016  Allergen Reaction Noted  . Shrimp [shellfish allergy] Anaphylaxis 07/16/2011    Family History  Problem Relation Age of Onset  . Heart failure Mother   . Hypertension Mother   . Hypertension Father   . Stroke Father   . Prostate cancer Father   .  Stroke Brother   . Prostate cancer Brother   . Colon cancer Neg Hx   . Colon polyps Neg Hx     Social History   Social History  . Marital status: Married    Spouse name: N/A  . Number of children: N/A  . Years of education: N/A   Occupational History  . Security [employer]   Social History Main Topics  . Smoking status: Current Some Day Smoker    Types: Cigars  . Smokeless tobacco: Never Used  . Alcohol use Yes     Comment: occasional shot of liquour every now and then   . Drug use: No  . Sexual activity: Yes   Other Topics Concern  . Not on file   Social History Narrative  . No narrative on file    Review of Systems: Gen: Denies any fever, chills, fatigue, weight loss, lack of appetite.  CV:  Denies chest pain, heart palpitations, peripheral edema, syncope.  Resp: Denies shortness of breath at rest or with exertion. Denies wheezing or cough.  GI: see HPI  GU : Denies urinary burning, urinary frequency, urinary hesitancy MS: knee pain  Derm: Denies rash, itching, dry skin Psych: Denies depression, anxiety, memory loss, and confusion Heme: Denies bruising, bleeding, and enlarged lymph nodes.  Physical Exam: BP (!) 161/90   Pulse 71   Temp (!) 97.3 F (36.3 C) (Oral)   Ht '6\' 1"'  (1.854 m)   Wt (!) 354 lb 12.8 oz (160.9 kg)   BMI 46.81 kg/m  General:   Alert and oriented. Pleasant and cooperative. Well-nourished and well-developed.  Head:  Normocephalic and atraumatic. Eyes:  Without icterus, sclera clear and conjunctiva pink.  Ears:  Normal auditory acuity. Nose:  No deformity, discharge,  or lesions. Mouth:  No deformity or lesions, oral mucosa pink.  Lungs:  Clear to auscultation bilaterally. No wheezes, rales, or rhonchi. No distress.  Heart:  S1, S2 present without murmurs appreciated.  Abdomen:  +BS, soft, obese, non-tender and non-distended. No HSM noted. No guarding or rebound. No masses appreciated.  Rectal:  Deferred  Msk:  Symmetrical without gross deformities. Normal posture. Extremities:  Without  edema. Neurologic:  Alert and  oriented x4 Psych:  Alert and cooperative. Normal mood and affect.

## 2016-11-11 NOTE — Patient Instructions (Signed)
AB verbally advised for pt to take 1/2 dose of Lantus evening before tcs/egd/dil and no Metformin the morning of procedure. Pt is aware and noted on his instructions.

## 2016-11-15 NOTE — Assessment & Plan Note (Signed)
66 year old male with solid food dysphagia for several months, associated weight loss due to decreased oral intake, no improvement despite Protonix. No prior EGD. Needs diagnostic EGD with dilation as appropriate in near future.  Proceed with upper endoscopy/dilation in the near future with Dr. Gala Romney. The risks, benefits, and alternatives have been discussed in detail with patient. They have stated understanding and desire to proceed.  Phenergan 12.5 mg IV on call to augment sedation Continue Protonix once daily

## 2016-11-15 NOTE — Assessment & Plan Note (Signed)
No prior colonoscopy. Low-volume hematochezia in remote past in setting of straining, otherwise no concerning signs/symptoms. No FH of colorectal cancer or polyps.   Proceed with TCS with Dr. Gala Romney in near future: the risks, benefits, and alternatives have been discussed with the patient in detail. The patient states understanding and desires to proceed. Phenergan 12.5 mg IV on call to augment sedation

## 2016-11-16 NOTE — Progress Notes (Signed)
cc'ed to pcp °

## 2016-12-08 ENCOUNTER — Encounter (HOSPITAL_COMMUNITY): Payer: Self-pay | Admitting: *Deleted

## 2016-12-08 ENCOUNTER — Ambulatory Visit (HOSPITAL_COMMUNITY)
Admission: RE | Admit: 2016-12-08 | Discharge: 2016-12-08 | Disposition: A | Discharge status: Home / Self Care | Payer: BC Managed Care – PPO | Source: Ambulatory Visit | Attending: Internal Medicine | Admitting: Internal Medicine

## 2016-12-08 ENCOUNTER — Encounter (HOSPITAL_COMMUNITY)
Admission: RE | Disposition: A | Discharge status: Home / Self Care | Payer: Self-pay | Source: Ambulatory Visit | Attending: Internal Medicine

## 2016-12-08 DIAGNOSIS — D123 Benign neoplasm of transverse colon: Secondary | ICD-10-CM | POA: Insufficient documentation

## 2016-12-08 DIAGNOSIS — Z1212 Encounter for screening for malignant neoplasm of rectum: Secondary | ICD-10-CM | POA: Diagnosis not present

## 2016-12-08 DIAGNOSIS — K5730 Diverticulosis of large intestine without perforation or abscess without bleeding: Secondary | ICD-10-CM | POA: Diagnosis not present

## 2016-12-08 DIAGNOSIS — Z8249 Family history of ischemic heart disease and other diseases of the circulatory system: Secondary | ICD-10-CM | POA: Insufficient documentation

## 2016-12-08 DIAGNOSIS — Z91013 Allergy to seafood: Secondary | ICD-10-CM | POA: Insufficient documentation

## 2016-12-08 DIAGNOSIS — I10 Essential (primary) hypertension: Secondary | ICD-10-CM | POA: Diagnosis not present

## 2016-12-08 DIAGNOSIS — K219 Gastro-esophageal reflux disease without esophagitis: Secondary | ICD-10-CM | POA: Insufficient documentation

## 2016-12-08 DIAGNOSIS — Z1211 Encounter for screening for malignant neoplasm of colon: Secondary | ICD-10-CM

## 2016-12-08 DIAGNOSIS — I4891 Unspecified atrial fibrillation: Secondary | ICD-10-CM | POA: Diagnosis not present

## 2016-12-08 DIAGNOSIS — Z8042 Family history of malignant neoplasm of prostate: Secondary | ICD-10-CM | POA: Diagnosis not present

## 2016-12-08 DIAGNOSIS — M109 Gout, unspecified: Secondary | ICD-10-CM | POA: Diagnosis not present

## 2016-12-08 DIAGNOSIS — E119 Type 2 diabetes mellitus without complications: Secondary | ICD-10-CM | POA: Diagnosis not present

## 2016-12-08 DIAGNOSIS — Z823 Family history of stroke: Secondary | ICD-10-CM | POA: Diagnosis not present

## 2016-12-08 DIAGNOSIS — Z79899 Other long term (current) drug therapy: Secondary | ICD-10-CM | POA: Insufficient documentation

## 2016-12-08 DIAGNOSIS — R1310 Dysphagia, unspecified: Secondary | ICD-10-CM

## 2016-12-08 DIAGNOSIS — Z87892 Personal history of anaphylaxis: Secondary | ICD-10-CM | POA: Insufficient documentation

## 2016-12-08 DIAGNOSIS — Z7982 Long term (current) use of aspirin: Secondary | ICD-10-CM | POA: Insufficient documentation

## 2016-12-08 DIAGNOSIS — D122 Benign neoplasm of ascending colon: Secondary | ICD-10-CM

## 2016-12-08 DIAGNOSIS — Z794 Long term (current) use of insulin: Secondary | ICD-10-CM | POA: Diagnosis not present

## 2016-12-08 DIAGNOSIS — K449 Diaphragmatic hernia without obstruction or gangrene: Secondary | ICD-10-CM | POA: Diagnosis not present

## 2016-12-08 DIAGNOSIS — D124 Benign neoplasm of descending colon: Secondary | ICD-10-CM | POA: Diagnosis not present

## 2016-12-08 DIAGNOSIS — F17290 Nicotine dependence, other tobacco product, uncomplicated: Secondary | ICD-10-CM | POA: Insufficient documentation

## 2016-12-08 HISTORY — PX: COLONOSCOPY: SHX5424

## 2016-12-08 HISTORY — PX: POLYPECTOMY: SHX5525

## 2016-12-08 HISTORY — PX: MALONEY DILATION: SHX5535

## 2016-12-08 HISTORY — PX: ESOPHAGOGASTRODUODENOSCOPY: SHX5428

## 2016-12-08 LAB — GLUCOSE, CAPILLARY: Glucose-Capillary: 118 mg/dL — ABNORMAL HIGH (ref 65–99)

## 2016-12-08 SURGERY — COLONOSCOPY
Anesthesia: Moderate Sedation

## 2016-12-08 MED ORDER — MIDAZOLAM HCL 5 MG/5ML IJ SOLN
INTRAMUSCULAR | Status: DC
Start: 2016-12-08 — End: 2016-12-08
  Filled 2016-12-08: qty 10

## 2016-12-08 MED ORDER — LIDOCAINE VISCOUS 2 % MT SOLN
OROMUCOSAL | Status: DC | PRN
  Administered 2016-12-08: 4 mL via OROMUCOSAL

## 2016-12-08 MED ORDER — SODIUM CHLORIDE 0.9% FLUSH
INTRAVENOUS | Status: AC
  Filled 2016-12-08: qty 10

## 2016-12-08 MED ORDER — ONDANSETRON HCL 4 MG/2ML IJ SOLN
INTRAMUSCULAR | Status: DC
Start: 2016-12-08 — End: 2016-12-08
  Filled 2016-12-08: qty 2

## 2016-12-08 MED ORDER — MEPERIDINE HCL 100 MG/ML IJ SOLN
INTRAMUSCULAR | Status: AC
  Filled 2016-12-08: qty 2

## 2016-12-08 MED ORDER — PROMETHAZINE HCL 25 MG/ML IJ SOLN
INTRAMUSCULAR | Status: AC
  Filled 2016-12-08: qty 1

## 2016-12-08 MED ORDER — PROMETHAZINE HCL 25 MG/ML IJ SOLN
12.5000 mg | Freq: Once | INTRAMUSCULAR | Status: AC
  Administered 2016-12-08: 12.5 mg via INTRAVENOUS

## 2016-12-08 MED ORDER — MEPERIDINE HCL 100 MG/ML IJ SOLN
INTRAMUSCULAR | Status: DC | PRN
  Administered 2016-12-08: 50 mg via INTRAVENOUS
  Administered 2016-12-08 (×2): 25 mg via INTRAVENOUS

## 2016-12-08 MED ORDER — ONDANSETRON HCL 4 MG/2ML IJ SOLN
INTRAMUSCULAR | Status: DC | PRN
  Administered 2016-12-08: 4 mg via INTRAVENOUS

## 2016-12-08 MED ORDER — LIDOCAINE VISCOUS 2 % MT SOLN
OROMUCOSAL | Status: AC
  Filled 2016-12-08: qty 15

## 2016-12-08 MED ORDER — STERILE WATER FOR IRRIGATION IR SOLN
Status: DC | PRN
  Administered 2016-12-08: 10:00:00

## 2016-12-08 MED ORDER — MIDAZOLAM HCL 5 MG/5ML IJ SOLN
INTRAMUSCULAR | Status: DC | PRN
  Administered 2016-12-08 (×2): 1 mg via INTRAVENOUS
  Administered 2016-12-08: 2 mg via INTRAVENOUS

## 2016-12-08 MED ORDER — SODIUM CHLORIDE 0.9 % IV SOLN
INTRAVENOUS | Status: DC
  Administered 2016-12-08: 1000 mL via INTRAVENOUS

## 2016-12-08 NOTE — H&P (View-Only) (Signed)
Primary Care Physician:  Lucia Gaskins, MD Primary Gastroenterologist:  Dr. Gala Romney   Chief Complaint  Patient presents with  . Dysphagia    HPI:   Kevin Goodman is a 66 y.o. male presenting today at the request of his PCP secondary to dysphagia. No prior EGD or colonoscopy. He has been caring for his wife, who is on hospice, and states he has put his own health on hold for awhile. Now desires to pursue evaluation for GI concerns.   When eating sometimes (ham, peanut butter, peanuts), gets stuck in mid esophagus and lots of mucus starts coming up. Has to regurgitate to feel better.  Just started taking Protonix again a few months ago. Symptoms started around June 2018. Has lost weight but was "scared to eat" anything. Believes he lost from about the 380 range down to 350s.   Lower GI: when started taking metformin, will occasionally have diarrhea. Occasional scant low-volume hematochezia on paper after straining.   Past Medical History:  Diagnosis Date  . Atrial fibrillation ([place])   . Diabetes mellitus without complication ([place])   . GERD (gastroesophageal reflux disease)   . Gout   . Hypertension     Past Surgical History:  Procedure Laterality Date  . FINGER SURGERY     left ring  . INCISION AND DRAINAGE OF WOUND Left 04/20/2016   Procedure: LEFT THIGH DEBRIDEMENT;  Surgeon: Arta Bruce Kinsinger, MD;  Location: [place];  Service: General;  Laterality: Left;    Current Outpatient Prescriptions  Medication Sig Dispense Refill  . allopurinol (ZYLOPRIM) 300 MG tablet Take 300 mg by mouth daily.    Marland Kitchen aspirin EC 81 MG EC tablet Take 1 tablet (81 mg total) by mouth daily.    Marland Kitchen glucose blood (FREESTYLE TEST STRIPS) test strip Use as instructed 100 each 0  . glucose monitoring kit (FREESTYLE) monitoring kit 1 each by Does not apply route 4 (four) times daily - after meals and at bedtime. 1 month Diabetic Testing Supplies for QAC-QHS accuchecks. 1 each 1  . insulin aspart  (NOVOLOG FLEXPEN) 100 UNIT/ML FlexPen CBG < 70: implement hypoglycemia protocol-call MD CBG 70 - 120: 0 units CBG 121 - 150: 2 units CBG 151 - 200: 3 units CBG 201 - 250: 5 units CBG 251 - 300: 8 units CBG 301 - 350: 11 units CBG 351 - 400: 15 units CBG > 400: call MD 15 mL 0  . Insulin Glargine (LANTUS) 100 UNIT/ML Solostar Pen Inject 30 Units into the skin daily at 10 pm. 15 mL 0  . Insulin Pen Needle 32G X 8 MM MISC Use as directed 100 each 0  . Lancets (FREESTYLE) lancets Use as instructed 100 each 0  . metFORMIN (GLUCOPHAGE) 500 MG tablet Take 500 mg by mouth 2 (two) times daily with a meal.    . metoprolol succinate (TOPROL-XL) 50 MG 24 hr tablet Take 50 mg by mouth daily. Take with or immediately following a meal.    . pantoprazole (PROTONIX) 40 MG tablet Take 40 mg by mouth daily.     No current facility-administered medications for this visit.     Allergies as of 11/11/2016 - Review Complete 11/11/2016  Allergen Reaction Noted  . Shrimp [shellfish allergy] Anaphylaxis 07/16/2011    Family History  Problem Relation Age of Onset  . Heart failure Mother   . Hypertension Mother   . Hypertension Father   . Stroke Father   . Prostate cancer Father   .  Stroke Brother   . Prostate cancer Brother   . Colon cancer Neg Hx   . Colon polyps Neg Hx     Social History   Social History  . Marital status: Married    Spouse name: N/A  . Number of children: N/A  . Years of education: N/A   Occupational History  . Security [employer]   Social History Main Topics  . Smoking status: Current Some Day Smoker    Types: Cigars  . Smokeless tobacco: Never Used  . Alcohol use Yes     Comment: occasional shot of liquour every now and then   . Drug use: No  . Sexual activity: Yes   Other Topics Concern  . Not on file   Social History Narrative  . No narrative on file    Review of Systems: Gen: Denies any fever, chills, fatigue, weight loss, lack of appetite.  CV:  Denies chest pain, heart palpitations, peripheral edema, syncope.  Resp: Denies shortness of breath at rest or with exertion. Denies wheezing or cough.  GI: see HPI  GU : Denies urinary burning, urinary frequency, urinary hesitancy MS: knee pain  Derm: Denies rash, itching, dry skin Psych: Denies depression, anxiety, memory loss, and confusion Heme: Denies bruising, bleeding, and enlarged lymph nodes.  Physical Exam: BP (!) 161/90   Pulse 71   Temp (!) 97.3 F (36.3 C) (Oral)   Ht '6\' 1"'  (1.854 m)   Wt (!) 354 lb 12.8 oz (160.9 kg)   BMI 46.81 kg/m  General:   Alert and oriented. Pleasant and cooperative. Well-nourished and well-developed.  Head:  Normocephalic and atraumatic. Eyes:  Without icterus, sclera clear and conjunctiva pink.  Ears:  Normal auditory acuity. Nose:  No deformity, discharge,  or lesions. Mouth:  No deformity or lesions, oral mucosa pink.  Lungs:  Clear to auscultation bilaterally. No wheezes, rales, or rhonchi. No distress.  Heart:  S1, S2 present without murmurs appreciated.  Abdomen:  +BS, soft, obese, non-tender and non-distended. No HSM noted. No guarding or rebound. No masses appreciated.  Rectal:  Deferred  Msk:  Symmetrical without gross deformities. Normal posture. Extremities:  Without  edema. Neurologic:  Alert and  oriented x4 Psych:  Alert and cooperative. Normal mood and affect.

## 2016-12-08 NOTE — Interval H&P Note (Signed)
History and Physical Interval Note:  12/08/2016 9:27 AM  Kevin Goodman  has presented today for surgery, with the diagnosis of screening colonoscopy, dysphagia  The various methods of treatment have been discussed with the patient and family. After consideration of risks, benefits and other options for treatment, the patient has consented to  Procedure(s) with comments: COLONOSCOPY (N/A) - 9:15am ESOPHAGOGASTRODUODENOSCOPY (EGD) (N/A) MALONEY DILATION (N/A) as a surgical intervention .  The patient's history has been reviewed, patient examined, no change in status, stable for surgery.  I have reviewed the patient's chart and labs.  Questions were answered to the patient's satisfaction.     Robert Rourk  No change. EGD with ED as feasible/appropriate today per plan along with first ever average risk screening colonoscopy.  The risks, benefits, limitations, imponderables and alternatives regarding both EGD and colonoscopy have been reviewed with the patient. Questions have been answered. All parties agreeable.

## 2016-12-08 NOTE — Op Note (Signed)
[facility] Patient Name: Kevin Goodman Procedure Date: 12/08/2016 9:46 AM MRN: [id number] Date of Birth: [birth year] Attending MD: Norvel Richards , MD CSN: [id number] Age: 66 Admit Type: Outpatient Procedure:                Colonoscopy Indications:              Screening for colorectal malignant neoplasm Providers:                Norvel Richards, MD, Gwenlyn Fudge RN, RN,                            Randa Spike, Technician Referring MD:              Medicines:                Midazolam 5 mg IV, Meperidine 100 mg IV,                            Promethazine 12.5 mg IV, Ondansetron 4 mg IV Complications:            No immediate complications. Estimated Blood Loss:     Estimated blood loss was minimal. Procedure:                Pre-Anesthesia Assessment:                           - Prior to the procedure, a History and Physical                            was performed, and patient medications and                            allergies were reviewed. The patient's tolerance of                            previous anesthesia was also reviewed. The risks                            and benefits of the procedure and the sedation                            options and risks were discussed with the patient.                            All questions were answered, and informed consent                            was obtained. Prior Anticoagulants: The patient has                            taken no previous anticoagulant or antiplatelet                            agents. ASA Grade Assessment: II - A patient with  mild systemic disease. After reviewing the risks                            and benefits, the patient was deemed in                            satisfactory condition to undergo the procedure.                           After obtaining informed consent, the colonoscope                            was passed under direct vision. Throughout the                   procedure, the patient's blood pressure, pulse, and                            oxygen saturations were monitored continuously. The                            EC-[id number] ([id number]) scope was introduced through                            the anus and advanced to the the cecum, identified                            by appendiceal orifice and ileocecal valve. The                            colonoscopy was performed without difficulty. The                            patient tolerated the procedure well. The quality                            of the bowel preparation was adequate. The                            ileocecal valve, appendiceal orifice, and rectum                            were photographed. Scope In: 9:50:19 AM Scope Out: 10:12:04 AM Scope Withdrawal Time: 0 hours 11 minutes 15 seconds  Total Procedure Duration: 0 hours 21 minutes 45 seconds  Findings:      Scattered small-mouthed diverticula were found in the sigmoid colon and       descending colon.      Four semi-pedunculated polyps were found in the descending colon and       ascending colon. These polyps were removed with a cold snare. Resection       and retrieval were complete. Estimated blood loss was minimal.      A few polyps were found in the descending colon and ascending colon.       These polyps were removed with a hot snare. Resection and retrieval were  complete. Estimated blood loss: none.      The exam was otherwise without abnormality on direct and retroflexion       views. Impression:               - Diverticulosis in the sigmoid colon and in the                            descending colon.                           - Four polyps in the descending colon and in the                            ascending colon, removed with a cold snare.                            Resected and retrieved.                           - A few polyps in the descending colon and in the                             ascending colon, removed with a hot snare. Resected                            and retrieved.                           - The examination was otherwise normal on direct                            and retroflexion views. Moderate Sedation:      Moderate (conscious) sedation was administered by the endoscopy nurse       and supervised by the endoscopist. The following parameters were       monitored: oxygen saturation, heart rate, blood pressure, respiratory       rate, EKG, adequacy of pulmonary ventilation, and response to care.       Total physician intraservice time was 41 minutes. Recommendation:           - Patient has a contact number available for                            emergencies. The signs and symptoms of potential                            delayed complications were discussed with the                            patient. Return to normal activities tomorrow.                            Written discharge instructions were provided to the  patient.                           - Advance diet as tolerated.                           - Continue present medications.                           - Repeat colonoscopy date to be determined after                            pending pathology results are reviewed for                            surveillance based on pathology results.                           - Return to GI clinic in 6 months. See EGD report. Procedure Code(s):        --- Professional ---                           [phone], Colonoscopy, flexible; with removal of                            tumor(s), polyp(s), or other lesion(s) by snare                            technique                           99152, Moderate sedation services provided by the                            same physician or other qualified health care                            professional performing the diagnostic or                            therapeutic service that the sedation  supports,                            requiring the presence of an independent trained                            observer to assist in the monitoring of the                            patient's level of consciousness and physiological                            status; initial 15 minutes of intraservice time,  patient age 56 years or older                           [phone], Moderate sedation services; each additional                            15 minutes intraservice time                           [phone], Moderate sedation services; each additional                            15 minutes intraservice time Diagnosis Code(s):        --- Professional ---                           Z12.11, Encounter for screening for malignant                            neoplasm of colon                           D12.4, Benign neoplasm of descending colon                           D12.2, Benign neoplasm of ascending colon                           K57.30, Diverticulosis of large intestine without                            perforation or abscess without bleeding CPT copyright 2016 [employer]. All rights reserved. The codes documented in this report are preliminary and upon coder review may  be revised to meet current compliance requirements. Cristopher Estimable. Diara Chaudhari, MD Norvel Richards, MD 12/08/2016 10:23:10 AM This report has been signed electronically. Number of Addenda: 0

## 2016-12-08 NOTE — Op Note (Signed)
[facility] Patient Name: Kevin Goodman Procedure Date: 12/08/2016 9:17 AM MRN: [id number] Date of Birth: 20-Jan-1951 Attending MD: Norvel Richards , MD CSN: [id number] Age: 66 Admit Type: Outpatient Procedure:                Upper GI endoscopy Indications:              Dysphagia Providers:                Norvel Richards, MD, Jeanann Lewandowsky. Gwenlyn Perking RN, RN,                            Randa Spike, Technician Referring MD:              Medicines:                Midazolam 3 mg IV, Meperidine 75 mg IV, Ondansetron                            4 mg IV, Promethazine 29.5 mg IV Complications:            No immediate complications. Estimated Blood Loss:     Estimated blood loss: none. Procedure:                Pre-Anesthesia Assessment:                           - Prior to the procedure, a History and Physical                            was performed, and patient medications and                            allergies were reviewed. The patient's tolerance of                            previous anesthesia was also reviewed. The risks                            and benefits of the procedure and the sedation                            options and risks were discussed with the patient.                            All questions were answered, and informed consent                            was obtained. Prior Anticoagulants: The patient has                            taken no previous anticoagulant or antiplatelet                            agents. ASA Grade Assessment: II - A patient with  mild systemic disease. After reviewing the risks                            and benefits, the patient was deemed in                            satisfactory condition to undergo the procedure.                           After obtaining informed consent, the endoscope was                            passed under direct vision. Throughout the                            procedure, the  patient's blood pressure, pulse, and                            oxygen saturations were monitored continuously. The                            [id number] ([id number]) scope was introduced through the                            mouth, and advanced to the second part of duodenum.                            The upper GI endoscopy was accomplished without                            difficulty. The patient tolerated the procedure                            well. Scope In: 9:37:20 AM Scope Out: 9:44:47 AM Total Procedure Duration: 0 hours 7 minutes 27 seconds  Findings:      The examined esophagus was normal. The scope was withdrawn. Dilation was       performed with a Maloney dilator with mild resistance at 56 Fr. The       dilation site was examined following endoscope reinsertion and showed no       change. Estimated blood loss: none.      A small hiatal hernia was present.      The exam was otherwise without abnormality.      The duodenal bulb and second portion of the duodenum were normal. Impression:               - Normal esophagus. Dilated.                           - Small hiatal hernia.                           - The examination was otherwise normal.                           - Normal  duodenal bulb and second portion of the                            duodenum.                           - No specimens collected. Moderate Sedation:      Moderate (conscious) sedation was administered by the endoscopy nurse       and supervised by the endoscopist. The following parameters were       monitored: oxygen saturation, heart rate, blood pressure, respiratory       rate, EKG, adequacy of pulmonary ventilation, and response to care.       Total physician intraservice time was 13 minutes. Recommendation:           - Patient has a contact number available for                            emergencies. The signs and symptoms of potential                            delayed complications were discussed  with the                            patient. Return to normal activities tomorrow.                            Written discharge instructions were provided to the                            patient.                           - Resume previous diet.                           - Continue present medications. Continue Protonix                            40 mg daily.                           - No repeat upper endoscopy.                           - Return to GI office in 6 months. Procedure Code(s):        --- Professional ---                           [phone], Esophagogastroduodenoscopy, flexible,                            transoral; diagnostic, including collection of                            specimen(s) by brushing or washing, when performed                            (  separate procedure)                           43450, Dilation of esophagus, by unguided sound or                            bougie, single or multiple passes                           99152, Moderate sedation services provided by the                            same physician or other qualified health care                            professional performing the diagnostic or                            therapeutic service that the sedation supports,                            requiring the presence of an independent trained                            observer to assist in the monitoring of the                            patient's level of consciousness and physiological                            status; initial 15 minutes of intraservice time,                            patient age 35 years or older Diagnosis Code(s):        --- Professional ---                           K44.9, Diaphragmatic hernia without obstruction or                            gangrene                           R13.10, Dysphagia, unspecified CPT copyright 2016 [employer]. All rights reserved. The codes documented in this report are  preliminary and upon coder review may  be revised to meet current compliance requirements. Cristopher Estimable. Arnez Stoneking, MD Norvel Richards, MD 12/08/2016 10:18:03 AM This report has been signed electronically. Number of Addenda: 0

## 2016-12-08 NOTE — Discharge Instructions (Signed)
Diverticulosis Diverticulosis is a condition that develops when small pouches (diverticula) form in the wall of the large intestine (colon). The colon is where water is absorbed and stool is formed. The pouches form when the inside layer of the colon pushes through weak spots in the outer layers of the colon. You may have a few pouches or many of them. What are the causes? The cause of this condition is not known. What increases the risk? The following factors may make you more likely to develop this condition: Being older than age 41. Your risk for this condition increases with age. Diverticulosis is rare among people younger than age 19. By age 82, many people have it. Eating a low-fiber diet. Having frequent constipation. Being overweight. Not getting enough exercise. Smoking. Taking over-the-counter pain medicines, like aspirin and ibuprofen. Having a family history of diverticulosis.  What are the signs or symptoms? In most people, there are no symptoms of this condition. If you do have symptoms, they may include: Bloating. Cramps in the abdomen. Constipation or diarrhea. Pain in the lower left side of the abdomen.  How is this diagnosed? This condition is most often diagnosed during an exam for other colon problems. Because diverticulosis usually has no symptoms, it often cannot be diagnosed independently. This condition may be diagnosed by: Using a flexible scope to examine the colon (colonoscopy). Taking an X-ray of the colon after dye has been put into the colon (barium enema). Doing a CT scan.  How is this treated? You may not need treatment for this condition if you have never developed an infection related to diverticulosis. If you have had an infection before, treatment may include: Eating a high-fiber diet. This may include eating more fruits, vegetables, and grains. Taking a fiber supplement. Taking a live bacteria supplement (probiotic). Taking medicine to relax your  colon. Taking antibiotic medicines.  Follow these instructions at home: Drink 6-8 glasses of water or more each day to prevent constipation. Try not to strain when you have a bowel movement. If you have had an infection before: Eat more fiber as directed by your health care provider or your diet and nutrition specialist (dietitian). Take a fiber supplement or probiotic, if your health care provider approves. Take over-the-counter and prescription medicines only as told by your health care provider. If you were prescribed an antibiotic, take it as told by your health care provider. Do not stop taking the antibiotic even if you start to feel better. Keep all follow-up visits as told by your health care provider. This is important. Contact a health care provider if: You have pain in your abdomen. You have bloating. You have cramps. You have not had a bowel movement in 3 days. Get help right away if: Your pain gets worse. Your bloating becomes very bad. You have a fever or chills, and your symptoms suddenly get worse. You vomit. You have bowel movements that are bloody or black. You have bleeding from your rectum. Summary Diverticulosis is a condition that develops when small pouches (diverticula) form in the wall of the large intestine (colon). You may have a few pouches or many of them. This condition is most often diagnosed during an exam for other colon problems. If you have had an infection related to diverticulosis, treatment may include increasing the fiber in your diet, taking supplements, or taking medicines. This information is not intended to replace advice given to you by your health care provider. Make sure you discuss any questions you have  with your health care provider. Document Released: 11/20/2003 Document Revised: 01/12/2016 Document Reviewed: 01/12/2016 Elsevier Interactive Patient Education  2017 [name]. Colon Polyps Polyps are tissue growths inside the body.  Polyps can grow in many places, including the large intestine (colon). A polyp may be a round bump or a mushroom-shaped growth. You could have one polyp or several. Most colon polyps are noncancerous (benign). However, some colon polyps can become cancerous over time. What are the causes? The exact cause of colon polyps is not known. What increases the risk? This condition is more likely to develop in people who: Have a family history of colon cancer or colon polyps. Are older than 57 or older than 45 if they are African American. Have inflammatory bowel disease, such as ulcerative colitis or Crohn disease. Are overweight. Smoke cigarettes. Do not get enough exercise. Drink too much alcohol. Eat a diet that is: High in fat and red meat. Low in fiber. Had childhood cancer that was treated with abdominal radiation.  What are the signs or symptoms? Most polyps do not cause symptoms. If you have symptoms, they may include: Blood coming from your rectum when having a bowel movement. Blood in your stool.The stool may look dark red or black. A change in bowel habits, such as constipation or diarrhea.  How is this diagnosed? This condition is diagnosed with a colonoscopy. This is a procedure that uses a lighted, flexible scope to look at the inside of your colon. How is this treated? Treatment for this condition involves removing any polyps that are found. Those polyps will then be tested for cancer. If cancer is found, your health care provider will talk to you about options for colon cancer treatment. Follow these instructions at home: Diet Eat plenty of fiber, such as fruits, vegetables, and whole grains. Eat foods that are high in calcium and vitamin D, such as milk, cheese, yogurt, eggs, liver, fish, and broccoli. Limit foods high in fat, red meats, and processed meats, such as hot dogs, sausage, bacon, and lunch meats. Maintain a healthy weight, or lose weight if recommended by your  health care provider. General instructions Do not smoke cigarettes. Do not drink alcohol excessively. Keep all follow-up visits as told by your health care provider. This is important. This includes keeping regularly scheduled colonoscopies. Talk to your health care provider about when you need a colonoscopy. Exercise every day or as told by your health care provider. Contact a health care provider if: You have new or worsening bleeding during a bowel movement. You have new or increased blood in your stool. You have a change in bowel habits. You unexpectedly lose weight. This information is not intended to replace advice given to you by your health care provider. Make sure you discuss any questions you have with your health care provider. Document Released: 11/19/2003 Document Revised: 07/31/2015 Document Reviewed: 01/13/2015 Elsevier Interactive Patient Education  2018 [employer].  Colonoscopy Discharge Instructions  Read the instructions outlined below and refer to this sheet in the next few weeks. These discharge instructions provide you with general information on caring for yourself after you leave the hospital. Your doctor may also give you specific instructions. While your treatment has been planned according to the most current medical practices available, unavoidable complications occasionally occur. If you have any problems or questions after discharge, call Dr. Gala Romney at [phone]. ACTIVITY  You may resume your regular activity, but move at a slower pace for the next 24 hours.  Take frequent rest periods for the next 24 hours.   Walking will help get rid of the air and reduce the bloated feeling in your belly (abdomen).   No driving for 24 hours (because of the medicine (anesthesia) used during the test).    Do not sign any important legal documents or operate any machinery for 24 hours (because of the anesthesia used during the test).  NUTRITION  Drink plenty of fluids.     You may resume your normal diet as instructed by your doctor.   Begin with a light meal and progress to your normal diet. Heavy or fried foods are harder to digest and may make you feel sick to your stomach (nauseated).   Avoid alcoholic beverages for 24 hours or as instructed.  MEDICATIONS  You may resume your normal medications unless your doctor tells you otherwise.  WHAT YOU CAN EXPECT TODAY  Some feelings of bloating in the abdomen.   Passage of more gas than usual.   Spotting of blood in your stool or on the toilet paper.  IF YOU HAD POLYPS REMOVED DURING THE COLONOSCOPY:  No aspirin products for 7 days or as instructed.   No alcohol for 7 days or as instructed.   Eat a soft diet for the next 24 hours.  FINDING OUT THE RESULTS OF YOUR TEST Not all test results are available during your visit. If your test results are not back during the visit, make an appointment with your caregiver to find out the results. Do not assume everything is normal if you have not heard from your caregiver or the medical facility. It is important for you to follow up on all of your test results.  SEEK IMMEDIATE MEDICAL ATTENTION IF:  You have more than a spotting of blood in your stool.   Your belly is swollen (abdominal distention).   You are nauseated or vomiting.   You have a temperature over 101.   You have abdominal pain or discomfort that is severe or gets worse throughout the day.  EGD Discharge instructions Please read the instructions outlined below and refer to this sheet in the next few weeks. These discharge instructions provide you with general information on caring for yourself after you leave the hospital. Your doctor may also give you specific instructions. While your treatment has been planned according to the most current medical practices available, unavoidable complications occasionally occur. If you have any problems or questions after discharge, please call your  doctor. ACTIVITY  You may resume your regular activity but move at a slower pace for the next 24 hours.   Take frequent rest periods for the next 24 hours.   Walking will help expel (get rid of) the air and reduce the bloated feeling in your abdomen.   No driving for 24 hours (because of the anesthesia (medicine) used during the test).   You may shower.   Do not sign any important legal documents or operate any machinery for 24 hours (because of the anesthesia used during the test).  NUTRITION  Drink plenty of fluids.   You may resume your normal diet.   Begin with a light meal and progress to your normal diet.   Avoid alcoholic beverages for 24 hours or as instructed by your caregiver.  MEDICATIONS  You may resume your normal medications unless your caregiver tells you otherwise.  WHAT YOU CAN EXPECT TODAY  You may experience abdominal discomfort such as a feeling of fullness or gas pains.  FOLLOW-UP  Your doctor will discuss the results of your test with you.  SEEK IMMEDIATE MEDICAL ATTENTION IF ANY OF THE FOLLOWING OCCUR:  Excessive nausea (feeling sick to your stomach) and/or vomiting.   Severe abdominal pain and distention (swelling).   Trouble swallowing.   Temperature over 101 F (37.8 C).   Rectal bleeding or vomiting of blood.    Continue Protonix 40 mg daily  Diverticulosis and colon polyp information provided  Further recommendations to follow pending review of pathology report  Office visit with [place] in 6 months.

## 2016-12-09 ENCOUNTER — Encounter: Payer: Self-pay | Admitting: Internal Medicine

## 2016-12-10 ENCOUNTER — Encounter (HOSPITAL_COMMUNITY): Payer: Self-pay | Admitting: Internal Medicine

## 2017-01-07 ENCOUNTER — Encounter (HOSPITAL_COMMUNITY): Payer: Self-pay | Admitting: Emergency Medicine

## 2017-01-07 ENCOUNTER — Emergency Department (HOSPITAL_COMMUNITY)
Admission: EM | Admit: 2017-01-07 | Discharge: 2017-01-07 | Disposition: A | Discharge status: Home / Self Care | Payer: BC Managed Care – PPO | Attending: Emergency Medicine | Admitting: Emergency Medicine

## 2017-01-07 ENCOUNTER — Emergency Department (HOSPITAL_COMMUNITY): Payer: BC Managed Care – PPO

## 2017-01-07 DIAGNOSIS — Z79899 Other long term (current) drug therapy: Secondary | ICD-10-CM | POA: Insufficient documentation

## 2017-01-07 DIAGNOSIS — Z794 Long term (current) use of insulin: Secondary | ICD-10-CM | POA: Insufficient documentation

## 2017-01-07 DIAGNOSIS — I10 Essential (primary) hypertension: Secondary | ICD-10-CM | POA: Diagnosis not present

## 2017-01-07 DIAGNOSIS — E119 Type 2 diabetes mellitus without complications: Secondary | ICD-10-CM | POA: Insufficient documentation

## 2017-01-07 DIAGNOSIS — Z7982 Long term (current) use of aspirin: Secondary | ICD-10-CM | POA: Diagnosis not present

## 2017-01-07 DIAGNOSIS — F17290 Nicotine dependence, other tobacco product, uncomplicated: Secondary | ICD-10-CM | POA: Insufficient documentation

## 2017-01-07 DIAGNOSIS — Y92410 Unspecified street and highway as the place of occurrence of the external cause: Secondary | ICD-10-CM | POA: Insufficient documentation

## 2017-01-07 DIAGNOSIS — Y998 Other external cause status: Secondary | ICD-10-CM | POA: Diagnosis not present

## 2017-01-07 DIAGNOSIS — Y9389 Activity, other specified: Secondary | ICD-10-CM | POA: Diagnosis not present

## 2017-01-07 DIAGNOSIS — R0789 Other chest pain: Secondary | ICD-10-CM | POA: Insufficient documentation

## 2017-01-07 DIAGNOSIS — S161XXA Strain of muscle, fascia and tendon at neck level, initial encounter: Secondary | ICD-10-CM | POA: Diagnosis not present

## 2017-01-07 DIAGNOSIS — S199XXA Unspecified injury of neck, initial encounter: Secondary | ICD-10-CM | POA: Diagnosis present

## 2017-01-07 MED ORDER — ACETAMINOPHEN 325 MG PO TABS
650.0000 mg | ORAL_TABLET | Freq: Once | ORAL | Status: AC
  Administered 2017-01-07: 650 mg via ORAL
  Filled 2017-01-07: qty 2

## 2017-01-07 MED ORDER — LIDOCAINE 5 % EX PTCH: 1.0000 | MEDICATED_PATCH | CUTANEOUS | 0 refills | Status: DC

## 2017-01-07 MED ORDER — METHOCARBAMOL 500 MG PO TABS: 500.0000 mg | ORAL_TABLET | Freq: Two times a day (BID) | ORAL | 0 refills | Status: DC

## 2017-01-07 NOTE — Discharge Instructions (Signed)
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
## 2017-06-07 ENCOUNTER — Ambulatory Visit: Payer: BC Managed Care – PPO | Admitting: Gastroenterology

## 2017-06-07 ENCOUNTER — Telehealth: Payer: Self-pay | Admitting: Gastroenterology

## 2017-06-07 ENCOUNTER — Encounter: Payer: Self-pay | Admitting: Gastroenterology

## 2017-06-07 NOTE — Telephone Encounter (Signed)
PATIENT WAS A NO SHOW AND LETTER SENT  °
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
## 2018-07-04 ENCOUNTER — Other Ambulatory Visit (HOSPITAL_COMMUNITY): Payer: Self-pay | Admitting: Family Medicine

## 2018-07-04 ENCOUNTER — Other Ambulatory Visit: Payer: Self-pay | Admitting: Family Medicine

## 2018-07-04 DIAGNOSIS — M542 Cervicalgia: Secondary | ICD-10-CM

## 2018-08-08 ENCOUNTER — Ambulatory Visit (HOSPITAL_COMMUNITY)
Admission: RE | Admit: 2018-08-08 | Discharge: 2018-08-08 | Disposition: A | Discharge status: Home / Self Care | Payer: Medicare Other | Source: Ambulatory Visit | Attending: Family Medicine | Admitting: Family Medicine

## 2018-08-08 ENCOUNTER — Other Ambulatory Visit: Payer: Self-pay

## 2018-08-08 ENCOUNTER — Encounter (HOSPITAL_COMMUNITY): Payer: Self-pay

## 2018-08-08 DIAGNOSIS — M542 Cervicalgia: Secondary | ICD-10-CM

## 2018-09-07 ENCOUNTER — Other Ambulatory Visit: Payer: Medicare Other

## 2018-09-07 ENCOUNTER — Other Ambulatory Visit: Payer: Self-pay

## 2018-09-07 DIAGNOSIS — Z20822 Contact with and (suspected) exposure to covid-19: Secondary | ICD-10-CM

## 2018-09-13 LAB — NOVEL CORONAVIRUS, NAA: SARS-CoV-2, NAA: NOT DETECTED

## 2019-12-25 ENCOUNTER — Encounter: Payer: Self-pay | Admitting: Internal Medicine

## 2020-06-09 ENCOUNTER — Other Ambulatory Visit: Payer: Self-pay

## 2020-06-09 ENCOUNTER — Ambulatory Visit (INDEPENDENT_AMBULATORY_CARE_PROVIDER_SITE_OTHER): Payer: Self-pay | Admitting: *Deleted

## 2020-06-09 VITALS — Ht 73.0 in | Wt 376.8 lb

## 2020-06-09 DIAGNOSIS — Z86010 Personal history of colonic polyps: Secondary | ICD-10-CM

## 2020-06-09 NOTE — Progress Notes (Signed)
Noted. Agree that patient needs OV for further evaluation of dysphagia.

## 2020-06-09 NOTE — Progress Notes (Signed)
Primary Care Physician:  Lucia Gaskins, MD Primary Gastroenterologist:  Dr. Gala Romney  Chief Complaint  Patient presents with  . Dysphagia    Difficulty swallowing food, occ pills. Feels like gets stuck  . Gastroesophageal Reflux    HPI:   Kevin Goodman is a 70 y.o. male presenting today with a history of adenomas and due for 3-year-surveillance with last colonoscopy Oct 2018. History of chronic GERD, with empiric dilation also completed in 2018.   States he is being evaluated for tachycardia by PCP. Has been off PPI for awhile. Quit taking it. Has reflux. Feels like food is being hung up in esophagus. Rare pill dysphagia unless taking too many at one time. Has difficulty with rice, junk food, popcorn, chips. Wife passed in 2018 from cancer. He had a hard time recuperating after this with his own health needs. Noted improvement in dilation historically. No abdominal pain. No overt GI bleeding.   States he denies any obvious palpitations right now.  Does note shortness of breath with exertion chronically. No chest pain. Sees Dr. Cindie Laroche next week. Undergoing Echo at that time.   Initially HR 143 on presentation. Now 150 at rest.    Past Medical History:  Diagnosis Date  . Atrial fibrillation ([place])   . Diabetes mellitus without complication ([place])   . GERD (gastroesophageal reflux disease)   . Gout   . Hypertension     Past Surgical History:  Procedure Laterality Date  . COLONOSCOPY N/A 12/08/2016   sigmoid and descending colon diverticula, four semi-pedunculated polyps in descending and ascendign colon, few polyps in descending and ascending colon (tubular adenomas). 3 year surveillance.   . ESOPHAGOGASTRODUODENOSCOPY N/A 12/08/2016   normal esophagus s/p empiric dilation, small hiatal hernia.   Marland Kitchen FINGER SURGERY     left ring  . INCISION AND DRAINAGE OF WOUND Left 04/20/2016   Procedure: LEFT THIGH DEBRIDEMENT;  Surgeon: Mickeal Skinner, MD;  Location: [facility];   Service: General;  Laterality: Left;  Marland Kitchen MALONEY DILATION N/A 12/08/2016   Procedure: Venia Minks DILATION;  Surgeon: Daneil Dolin, MD;  [facility];  Service: Endoscopy;  Laterality: N/A;  . POLYPECTOMY  12/08/2016   Procedure: POLYPECTOMY;  Surgeon: Daneil Dolin, MD;  Location: [facility];  Service: Endoscopy;;  colon    MED LIST NOT COMPLETE AS DID NOT BRING MEDICATION LIST Current Outpatient Medications  Medication Sig Dispense Refill  . allopurinol (ZYLOPRIM) 300 MG tablet Take 300 mg by mouth daily.    Marland Kitchen aspirin EC 81 MG EC tablet Take 1 tablet (81 mg total) by mouth daily.    . insulin aspart (NOVOLOG FLEXPEN) 100 UNIT/ML FlexPen CBG < 70: implement hypoglycemia protocol-call MD CBG 70 - 120: 0 units CBG 121 - 150: 2 units CBG 151 - 200: 3 units CBG 201 - 250: 5 units CBG 251 - 300: 8 units CBG 301 - 350: 11 units CBG 351 - 400: 15 units CBG > 400: call MD 15 mL 0  . Insulin Glargine (LANTUS) 100 UNIT/ML Solostar Pen Inject 30 Units into the skin daily at 10 pm. 15 mL 0  . lidocaine (LIDODERM) 5 % Place 1 patch onto the skin daily. Remove & Discard patch within 12 hours or as directed by MD 30 patch 0  . metFORMIN (GLUCOPHAGE) 500 MG tablet Take 500 mg by mouth 2 (two) times daily with a meal.    . methocarbamol (ROBAXIN) 500 MG tablet Take 1 tablet (  500 mg total) by mouth 2 (two) times daily. 20 tablet 0  . metoprolol succinate (TOPROL-XL) 50 MG 24 hr tablet Take 50 mg by mouth daily. Take with or immediately following a meal.    . Multiple Vitamin (MULTIVITAMIN WITH MINERALS) TABS tablet Take 1 tablet by mouth daily.    . pantoprazole (PROTONIX) 40 MG tablet Take 40 mg by mouth daily.    . polyethylene glycol-electrolytes (TRILYTE) 420 g solution Take 4,000 mLs by mouth as directed. 4000 mL 0  . vitamin B-12 (CYANOCOBALAMIN) 1000 MCG tablet Take 1,000 mcg by mouth daily.     No current facility-administered medications for this visit.    Allergies as of  06/10/2020  . (No Known Allergies)    Family History  Problem Relation Age of Onset  . Heart failure Mother   . Hypertension Mother   . Hypertension Father   . Stroke Father   . Prostate cancer Father   . Stroke Brother   . Prostate cancer Brother   . Colon cancer Neg Hx   . Colon polyps Neg Hx     Social History   Socioeconomic History  . Marital status: Widowed    Spouse name: Not on file  . Number of children: Not on file  . Years of education: Not on file  . Highest education level: Not on file  Occupational History  . Occupation: Public house manager: [employer]  Tobacco Use  . Smoking status: Current Some Day Smoker    Types: Cigars  . Smokeless tobacco: Never Used  Vaping Use  . Vaping Use: Never used  Substance and Sexual Activity  . Alcohol use: Yes    Comment: occasional shot of liquour every now and then   . Drug use: No  . Sexual activity: Yes  Other Topics Concern  . Not on file  Social History Narrative  . Not on file   Social Determinants of Health   Financial Resource Strain: Not on file  Food Insecurity: Not on file  Transportation Needs: Not on file  Physical Activity: Not on file  Stress: Not on file  Social Connections: Not on file  Intimate Partner Violence: Not on file    Review of Systems: Gen: Denies any fever, chills, fatigue, weight loss, lack of appetite.  CV: see HPI Resp: see HPI GI: see HPI GU : Denies urinary burning, urinary frequency, urinary hesitancy MS: Denies joint pain, muscle weakness, cramps, or limitation of movement.  Derm: Denies rash, itching, dry skin Psych: Denies depression, anxiety, memory loss, and confusion Heme: Denies bruising, bleeding, and enlarged lymph nodes.  Physical Exam: BP (!) 154/102   Pulse (!) 143, repeat pulse at rest 150  Temp (!) 96.7 F (35.9 C) (Temporal)   Ht 6\' 1"  (1.854 m)   Wt (!) 393 lb 3.2 oz (178.4 kg)   BMI 51.88 kg/m  General:   Alert and oriented. Pleasant  and cooperative. Well-nourished and well-developed.  Head:  Normocephalic and atraumatic. Eyes:  Without icterus, sclera clear and conjunctiva pink.  Ears:  Normal auditory acuity. Mouth:  No deformity or lesions, oral mucosa pink.  Lungs:  Clear to auscultation bilaterally.  Heart:  S1, S2 present, bounding pulse, tachycardic at 150 Abdomen:  Sitting up in chair, obese, no TTP, limited exam as patient in chair Rectal:  Deferred  Msk:  Symmetrical without gross deformities. Normal posture. Extremities:  With chronic LLE edema for years, greater than right Neurologic:  Alert and  oriented x4 Skin:  Intact without significant lesions or rashes. Psych:  Alert and cooperative. Normal mood and affect.  ASSESSMENT/Plan: ALTER MOSS is a 70 y.o. male presenting today with a history of adenomas and due for 3-year-surveillance with last colonoscopy Oct 2018. History of chronic GERD, with empiric dilation also completed in 2018. Now with recurrent dysphagia.  Vitals today with BP 154/102, and pulse initially 143. I rechecked pulse again while sitting in chair after he had rested from coming inside, and it remains bounding at 150. Does not appear irregular (such as afib). He denies obvious palpitations but does note shortness of breath, although he notes this is chronic. No chest pain. Although he is being evaluated through PCP as outpatient, the history is not entirely clear, and he has remained tachycardic even at rest. Discussed this deserved medical attention beyond what we could offer. Initially, he did not want to present to the ED, but he is willing to do so now. I contacted the ED to let them know he was arriving.  I have sent in pantoprazole to resume. He will need surveillance colonoscopy, endoscopy/dilation in future once he has been evaluated from a cardiac standpoint. He has noted improvement historically with empiric dilation.  As of note, med list not complete at time of visit. Will  update this when received from pharmacy.    Annitta Needs, PhD, ANP-BC [facility]

## 2020-06-09 NOTE — Progress Notes (Signed)
Pt came in for nurse triage visit.  Informed me before we got finished that he was experiencing some difficulty swallowing and food getting stuck in his esophagus.  Made ov for 06/10/2020 at 8:30 with Roseanne Kaufman, NP.  Routing to [employer], [place] as Juluis Rainier.

## 2020-06-10 ENCOUNTER — Telehealth: Payer: Self-pay | Admitting: Gastroenterology

## 2020-06-10 ENCOUNTER — Inpatient Hospital Stay (HOSPITAL_COMMUNITY)
Admission: EM | Admit: 2020-06-10 | Discharge: 2020-06-14 | DRG: 308 | Disposition: A | Discharge status: Home / Self Care | Payer: Medicare PPO | Attending: Internal Medicine | Admitting: Internal Medicine

## 2020-06-10 ENCOUNTER — Encounter (HOSPITAL_COMMUNITY): Payer: Self-pay

## 2020-06-10 ENCOUNTER — Ambulatory Visit: Payer: Medicare PPO | Admitting: Gastroenterology

## 2020-06-10 ENCOUNTER — Encounter: Payer: Self-pay | Admitting: Gastroenterology

## 2020-06-10 ENCOUNTER — Other Ambulatory Visit: Payer: Self-pay

## 2020-06-10 ENCOUNTER — Emergency Department (HOSPITAL_COMMUNITY): Payer: Medicare PPO

## 2020-06-10 VITALS — BP 154/102 | HR 143 | Temp 96.7°F | Ht 73.0 in | Wt 393.2 lb

## 2020-06-10 DIAGNOSIS — R1310 Dysphagia, unspecified: Secondary | ICD-10-CM

## 2020-06-10 DIAGNOSIS — K219 Gastro-esophageal reflux disease without esophagitis: Secondary | ICD-10-CM | POA: Insufficient documentation

## 2020-06-10 DIAGNOSIS — Z794 Long term (current) use of insulin: Secondary | ICD-10-CM

## 2020-06-10 DIAGNOSIS — I4891 Unspecified atrial fibrillation: Secondary | ICD-10-CM | POA: Diagnosis present

## 2020-06-10 DIAGNOSIS — N179 Acute kidney failure, unspecified: Secondary | ICD-10-CM | POA: Diagnosis present

## 2020-06-10 DIAGNOSIS — J1282 Pneumonia due to coronavirus disease 2019: Secondary | ICD-10-CM | POA: Diagnosis present

## 2020-06-10 DIAGNOSIS — D126 Benign neoplasm of colon, unspecified: Secondary | ICD-10-CM | POA: Diagnosis not present

## 2020-06-10 DIAGNOSIS — E66813 Obesity, class 3: Secondary | ICD-10-CM | POA: Diagnosis present

## 2020-06-10 DIAGNOSIS — Z8249 Family history of ischemic heart disease and other diseases of the circulatory system: Secondary | ICD-10-CM | POA: Diagnosis not present

## 2020-06-10 DIAGNOSIS — G4733 Obstructive sleep apnea (adult) (pediatric): Secondary | ICD-10-CM | POA: Diagnosis present

## 2020-06-10 DIAGNOSIS — U071 COVID-19: Secondary | ICD-10-CM | POA: Diagnosis present

## 2020-06-10 DIAGNOSIS — E1165 Type 2 diabetes mellitus with hyperglycemia: Secondary | ICD-10-CM

## 2020-06-10 DIAGNOSIS — M109 Gout, unspecified: Secondary | ICD-10-CM | POA: Diagnosis present

## 2020-06-10 DIAGNOSIS — R000 Tachycardia, unspecified: Secondary | ICD-10-CM

## 2020-06-10 DIAGNOSIS — Z87891 Personal history of nicotine dependence: Secondary | ICD-10-CM | POA: Diagnosis not present

## 2020-06-10 DIAGNOSIS — I498 Other specified cardiac arrhythmias: Secondary | ICD-10-CM | POA: Insufficient documentation

## 2020-06-10 DIAGNOSIS — E119 Type 2 diabetes mellitus without complications: Secondary | ICD-10-CM

## 2020-06-10 DIAGNOSIS — I480 Paroxysmal atrial fibrillation: Secondary | ICD-10-CM | POA: Diagnosis present

## 2020-06-10 DIAGNOSIS — I483 Typical atrial flutter: Secondary | ICD-10-CM | POA: Diagnosis present

## 2020-06-10 DIAGNOSIS — I4892 Unspecified atrial flutter: Principal | ICD-10-CM

## 2020-06-10 DIAGNOSIS — Z6841 Body Mass Index (BMI) 40.0 and over, adult: Secondary | ICD-10-CM

## 2020-06-10 DIAGNOSIS — Z7982 Long term (current) use of aspirin: Secondary | ICD-10-CM | POA: Diagnosis not present

## 2020-06-10 DIAGNOSIS — Z79899 Other long term (current) drug therapy: Secondary | ICD-10-CM

## 2020-06-10 DIAGNOSIS — I10 Essential (primary) hypertension: Secondary | ICD-10-CM | POA: Diagnosis present

## 2020-06-10 DIAGNOSIS — Z7984 Long term (current) use of oral hypoglycemic drugs: Secondary | ICD-10-CM

## 2020-06-10 DIAGNOSIS — R1319 Other dysphagia: Secondary | ICD-10-CM | POA: Diagnosis not present

## 2020-06-10 LAB — BRAIN NATRIURETIC PEPTIDE: B Natriuretic Peptide: 410 pg/mL — ABNORMAL HIGH (ref 0.0–100.0)

## 2020-06-10 LAB — COMPREHENSIVE METABOLIC PANEL
ALT: 64 U/L — ABNORMAL HIGH (ref 0–44)
AST: 33 U/L (ref 15–41)
Albumin: 4 g/dL (ref 3.5–5.0)
Alkaline Phosphatase: 62 U/L (ref 38–126)
Anion gap: 9 (ref 5–15)
BUN: 20 mg/dL (ref 8–23)
CO2: 24 mmol/L (ref 22–32)
Calcium: 9.4 mg/dL (ref 8.9–10.3)
Chloride: 108 mmol/L (ref 98–111)
Creatinine, Ser: 1.31 mg/dL — ABNORMAL HIGH (ref 0.61–1.24)
GFR, Estimated: 60 mL/min — ABNORMAL LOW (ref >60)
Glucose, Bld: 153 mg/dL — ABNORMAL HIGH (ref 70–99)
Potassium: 4.7 mmol/L (ref 3.5–5.1)
Sodium: 141 mmol/L (ref 135–145)
Total Bilirubin: 0.9 mg/dL (ref 0.3–1.2)
Total Protein: 7.2 g/dL (ref 6.5–8.1)

## 2020-06-10 LAB — CBC WITH DIFFERENTIAL/PLATELET
Abs Immature Granulocytes: 0.04 10*3/uL (ref 0.00–0.07)
Basophils Absolute: 0.1 10*3/uL (ref 0.0–0.1)
Basophils Relative: 1 %
Eosinophils Absolute: 0.2 10*3/uL (ref 0.0–0.5)
Eosinophils Relative: 2 %
HCT: 42.3 % (ref 39.0–52.0)
Hemoglobin: 13.4 g/dL (ref 13.0–17.0)
Immature Granulocytes: 0 %
Lymphocytes Relative: 37 %
Lymphs Abs: 3.8 10*3/uL (ref 0.7–4.0)
MCH: 30.4 pg (ref 26.0–34.0)
MCHC: 31.7 g/dL (ref 30.0–36.0)
MCV: 95.9 fL (ref 80.0–100.0)
Monocytes Absolute: 0.7 10*3/uL (ref 0.1–1.0)
Monocytes Relative: 7 %
Neutro Abs: 5.4 10*3/uL (ref 1.7–7.7)
Neutrophils Relative %: 53 %
Platelets: 313 10*3/uL (ref 150–400)
RBC: 4.41 MIL/uL (ref 4.22–5.81)
RDW: 15.9 % — ABNORMAL HIGH (ref 11.5–15.5)
WBC: 10.3 10*3/uL (ref 4.0–10.5)
nRBC: 0 % (ref 0.0–0.2)

## 2020-06-10 LAB — RESP PANEL BY RT-PCR (FLU A&B, COVID) ARPGX2
Influenza A by PCR: NEGATIVE
Influenza B by PCR: NEGATIVE
SARS Coronavirus 2 by RT PCR: POSITIVE — AB

## 2020-06-10 LAB — TSH: TSH: 4.01 u[IU]/mL (ref 0.350–4.500)

## 2020-06-10 LAB — TROPONIN I (HIGH SENSITIVITY)
Troponin I (High Sensitivity): 8 ng/L (ref <18)
Troponin I (High Sensitivity): 8 ng/L (ref <18)

## 2020-06-10 LAB — CBG MONITORING, ED: Glucose-Capillary: 154 mg/dL — ABNORMAL HIGH (ref 70–99)

## 2020-06-10 LAB — HIV ANTIBODY (ROUTINE TESTING W REFLEX): HIV Screen 4th Generation wRfx: NONREACTIVE

## 2020-06-10 MED ORDER — ALLOPURINOL 300 MG PO TABS
300.0000 mg | ORAL_TABLET | Freq: Every day | ORAL | Status: DC
  Administered 2020-06-10 – 2020-06-14 (×5): 300 mg via ORAL
  Filled 2020-06-10 (×5): qty 1

## 2020-06-10 MED ORDER — ALBUTEROL SULFATE HFA 108 (90 BASE) MCG/ACT IN AERS
2.0000 | INHALATION_SPRAY | Freq: Four times a day (QID) | RESPIRATORY_TRACT | Status: DC
  Administered 2020-06-10: 2 via RESPIRATORY_TRACT
  Filled 2020-06-10: qty 6.7

## 2020-06-10 MED ORDER — ASCORBIC ACID 500 MG PO TABS
500.0000 mg | ORAL_TABLET | Freq: Every day | ORAL | Status: DC
  Administered 2020-06-11 – 2020-06-14 (×4): 500 mg via ORAL
  Filled 2020-06-10 (×4): qty 1

## 2020-06-10 MED ORDER — PANTOPRAZOLE SODIUM 40 MG PO TBEC: 40.0000 mg | DELAYED_RELEASE_TABLET | Freq: Every day | ORAL | 3 refills | Status: DC

## 2020-06-10 MED ORDER — PANTOPRAZOLE SODIUM 40 MG PO TBEC
40.0000 mg | DELAYED_RELEASE_TABLET | Freq: Every day | ORAL | Status: DC
  Administered 2020-06-10 – 2020-06-14 (×5): 40 mg via ORAL
  Filled 2020-06-10 (×5): qty 1

## 2020-06-10 MED ORDER — CHLORHEXIDINE GLUCONATE CLOTH 2 % EX PADS
6.0000 | MEDICATED_PAD | Freq: Every day | CUTANEOUS | Status: DC
  Administered 2020-06-11 – 2020-06-14 (×3): 6 via TOPICAL

## 2020-06-10 MED ORDER — ASPIRIN EC 81 MG PO TBEC: 81.0000 mg | DELAYED_RELEASE_TABLET | Freq: Every day | ORAL | Status: DC

## 2020-06-10 MED ORDER — DILTIAZEM HCL-DEXTROSE 125-5 MG/125ML-% IV SOLN (PREMIX)
5.0000 mg/h | INTRAVENOUS | Status: DC
  Administered 2020-06-10: 5 mg/h via INTRAVENOUS
  Administered 2020-06-13: 10 mg/h via INTRAVENOUS
  Administered 2020-06-13: 5 mg/h via INTRAVENOUS
  Filled 2020-06-10 (×7): qty 125

## 2020-06-10 MED ORDER — METOPROLOL TARTRATE 5 MG/5ML IV SOLN
5.0000 mg | Freq: Once | INTRAVENOUS | Status: AC
  Administered 2020-06-10: 5 mg via INTRAVENOUS
  Filled 2020-06-10: qty 5

## 2020-06-10 MED ORDER — HYDROMORPHONE HCL 1 MG/ML IJ SOLN: 0.5000 mg | INTRAMUSCULAR | Status: DC | PRN

## 2020-06-10 MED ORDER — APIXABAN 5 MG PO TABS
5.0000 mg | ORAL_TABLET | Freq: Two times a day (BID) | ORAL | Status: DC
  Administered 2020-06-10 – 2020-06-14 (×9): 5 mg via ORAL
  Filled 2020-06-10 (×9): qty 1

## 2020-06-10 MED ORDER — HYDRALAZINE HCL 20 MG/ML IJ SOLN: 10.0000 mg | INTRAMUSCULAR | Status: DC | PRN

## 2020-06-10 MED ORDER — TRAZODONE HCL 50 MG PO TABS
25.0000 mg | ORAL_TABLET | Freq: Every evening | ORAL | Status: DC | PRN
  Administered 2020-06-11 – 2020-06-12 (×2): 25 mg via ORAL
  Filled 2020-06-10 (×3): qty 1

## 2020-06-10 MED ORDER — ZINC SULFATE 220 (50 ZN) MG PO CAPS
220.0000 mg | ORAL_CAPSULE | Freq: Every day | ORAL | Status: DC
  Administered 2020-06-11 – 2020-06-14 (×5): 220 mg via ORAL
  Filled 2020-06-10 (×5): qty 1

## 2020-06-10 MED ORDER — ALUM & MAG HYDROXIDE-SIMETH 200-200-20 MG/5ML PO SUSP
30.0000 mL | Freq: Four times a day (QID) | ORAL | Status: DC | PRN
  Administered 2020-06-10: 30 mL via ORAL
  Filled 2020-06-10: qty 30

## 2020-06-10 MED ORDER — ONDANSETRON HCL 4 MG PO TABS: 4.0000 mg | ORAL_TABLET | Freq: Four times a day (QID) | ORAL | Status: DC | PRN

## 2020-06-10 MED ORDER — GUAIFENESIN-DM 100-10 MG/5ML PO SYRP
5.0000 mL | ORAL_SOLUTION | ORAL | Status: DC | PRN
  Administered 2020-06-10 – 2020-06-14 (×5): 5 mL via ORAL
  Filled 2020-06-10 (×5): qty 5

## 2020-06-10 MED ORDER — SODIUM CHLORIDE 0.9 % IV SOLN: INTRAVENOUS | Status: DC

## 2020-06-10 MED ORDER — MAGNESIUM CITRATE PO SOLN: 1.0000 | Freq: Once | ORAL | Status: DC | PRN

## 2020-06-10 MED ORDER — ACETAMINOPHEN 650 MG RE SUPP: 650.0000 mg | Freq: Four times a day (QID) | RECTAL | Status: DC | PRN

## 2020-06-10 MED ORDER — INSULIN GLARGINE 100 UNIT/ML ~~LOC~~ SOLN
30.0000 [IU] | Freq: Every day | SUBCUTANEOUS | Status: DC
  Administered 2020-06-11 – 2020-06-13 (×4): 30 [IU] via SUBCUTANEOUS
  Filled 2020-06-10 (×6): qty 0.3

## 2020-06-10 MED ORDER — LEVALBUTEROL HCL 0.63 MG/3ML IN NEBU: 0.6300 mg | INHALATION_SOLUTION | Freq: Four times a day (QID) | RESPIRATORY_TRACT | Status: DC | PRN

## 2020-06-10 MED ORDER — SODIUM CHLORIDE 0.9% FLUSH
3.0000 mL | INTRAVENOUS | Status: DC | PRN
  Administered 2020-06-13: 3 mL via INTRAVENOUS

## 2020-06-10 MED ORDER — ACETAMINOPHEN 325 MG PO TABS: 650.0000 mg | ORAL_TABLET | Freq: Four times a day (QID) | ORAL | Status: DC | PRN

## 2020-06-10 MED ORDER — SODIUM CHLORIDE 0.9% FLUSH
3.0000 mL | Freq: Two times a day (BID) | INTRAVENOUS | Status: DC
  Administered 2020-06-11 – 2020-06-14 (×6): 3 mL via INTRAVENOUS

## 2020-06-10 MED ORDER — HEPARIN SODIUM (PORCINE) 5000 UNIT/ML IJ SOLN: 5000.0000 [IU] | Freq: Three times a day (TID) | INTRAMUSCULAR | Status: DC

## 2020-06-10 MED ORDER — FUROSEMIDE 10 MG/ML IJ SOLN
40.0000 mg | Freq: Once | INTRAMUSCULAR | Status: AC
  Administered 2020-06-10: 40 mg via INTRAVENOUS
  Filled 2020-06-10: qty 4

## 2020-06-10 MED ORDER — LABETALOL HCL 200 MG PO TABS: 200.0000 mg | ORAL_TABLET | Freq: Two times a day (BID) | ORAL | Status: DC

## 2020-06-10 MED ORDER — ALBUTEROL SULFATE HFA 108 (90 BASE) MCG/ACT IN AERS: 2.0000 | INHALATION_SPRAY | RESPIRATORY_TRACT | Status: DC | PRN

## 2020-06-10 MED ORDER — ADULT MULTIVITAMIN W/MINERALS CH
1.0000 | ORAL_TABLET | Freq: Every day | ORAL | Status: DC
  Administered 2020-06-10 – 2020-06-14 (×5): 1 via ORAL
  Filled 2020-06-10 (×5): qty 1

## 2020-06-10 MED ORDER — SENNOSIDES-DOCUSATE SODIUM 8.6-50 MG PO TABS: 1.0000 | ORAL_TABLET | Freq: Every evening | ORAL | Status: DC | PRN

## 2020-06-10 MED ORDER — ONDANSETRON HCL 4 MG/2ML IJ SOLN: 4.0000 mg | Freq: Four times a day (QID) | INTRAMUSCULAR | Status: DC | PRN

## 2020-06-10 MED ORDER — BISACODYL 5 MG PO TBEC: 5.0000 mg | DELAYED_RELEASE_TABLET | Freq: Every day | ORAL | Status: DC | PRN

## 2020-06-10 MED ORDER — METFORMIN HCL 500 MG PO TABS
500.0000 mg | ORAL_TABLET | Freq: Two times a day (BID) | ORAL | Status: DC
  Administered 2020-06-10: 500 mg via ORAL
  Filled 2020-06-10 (×2): qty 1

## 2020-06-10 MED ORDER — SODIUM CHLORIDE 0.9% FLUSH
3.0000 mL | Freq: Two times a day (BID) | INTRAVENOUS | Status: DC
  Administered 2020-06-11 – 2020-06-13 (×4): 3 mL via INTRAVENOUS

## 2020-06-10 MED ORDER — DILTIAZEM HCL 25 MG/5ML IV SOLN
10.0000 mg | Freq: Once | INTRAVENOUS | Status: AC
  Administered 2020-06-10: 10 mg via INTRAVENOUS
  Filled 2020-06-10: qty 5

## 2020-06-10 MED ORDER — IPRATROPIUM BROMIDE 0.02 % IN SOLN: 0.5000 mg | Freq: Four times a day (QID) | RESPIRATORY_TRACT | Status: DC | PRN

## 2020-06-10 MED ORDER — SODIUM CHLORIDE 0.9 % IV SOLN: 250.0000 mL | INTRAVENOUS | Status: DC | PRN

## 2020-06-10 MED ORDER — OXYCODONE HCL 5 MG PO TABS: 5.0000 mg | ORAL_TABLET | ORAL | Status: DC | PRN

## 2020-06-10 MED ORDER — METHYLPREDNISOLONE SODIUM SUCC 125 MG IJ SOLR
125.0000 mg | Freq: Once | INTRAMUSCULAR | Status: AC
  Administered 2020-06-10: 125 mg via INTRAVENOUS
  Filled 2020-06-10: qty 2

## 2020-06-10 NOTE — Evaluation (Signed)
Physical Therapy Evaluation Patient Details Name: Kevin Goodman MRN: [id number] DOB: 04-Oct-1950 Today's Date: 06/10/2020   History of Present Illness  Pt is a 70 yo male presents from GI office due to SOB and heart rate of 150. PMH: afib, HTN, diabetes  Clinical Impression  Pt admitted with above diagnosis. At baseline, pt independent, works full time as Presenter, broadcasting. Eval limited due to HR up to 140s with supine to sit, no physical assist required for bed mobility. Pt reports being active at baseline, will continue to assess transfers and ambulation pending HR in therapeutic range. Pt currently with functional limitations due to the deficits listed below (see PT Problem List). Pt will benefit from skilled PT to increase their independence and safety with mobility to allow discharge to the venue listed below.       Follow Up Recommendations No PT follow up    Equipment Recommendations  None recommended by PT    Recommendations for Other Services       Precautions / Restrictions Precautions Precautions: Fall;Other (comment) Precaution Comments: monitor HR Restrictions Weight Bearing Restrictions: No      Mobility  Bed Mobility Overal bed mobility: Needs Assistance Bed Mobility: Supine to Sit;Sit to Supine  Supine to sit: Supervision Sit to supine: Supervision   General bed mobility comments: increased time, HOB slightly elevated with use of bedrail, SUPV for safety, HR up to 140s with sitting EOB so deferred further mobility    Transfers  General transfer comment: HR 140s with supine to sit so deferred transfers  Ambulation/Gait  General Gait Details: HR 140s with supine to sit so deferred ambulation  Stairs            Wheelchair Mobility    Modified Rankin (Stroke Patients Only)       Balance Overall balance assessment: No apparent balance deficits (not formally assessed)        Pertinent Vitals/Pain Pain Assessment: No/denies pain    Home Living  Family/patient expects to be discharged to:: Private residence Living Arrangements: Children Available Help at Discharge: Family;Available PRN/intermittently Type of Home: House Home Access: Stairs to enter   [employer] of Steps: 9-10 Home Layout: Two level Home Equipment: None      Prior Function Level of Independence: Independent  Comments: Pt reports ind with ADLs, ambulation, drives, works FT as Presenter, 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     Hand Dominance        Extremity/Trunk Assessment   Upper Extremity Assessment Upper Extremity Assessment: Overall WFL for tasks assessed    Lower Extremity Assessment Lower Extremity Assessment: Overall WFL for tasks assessed    Cervical / Trunk Assessment Cervical / Trunk Assessment: Normal  Communication   Communication: No difficulties  Cognition Arousal/Alertness: Awake/alert Behavior During Therapy: WFL for tasks assessed/performed Overall Cognitive Status: Within Functional Limits for tasks assessed     General Comments      Exercises     Assessment/Plan    PT Assessment Patient needs continued PT services  PT Problem List Decreased activity tolerance;Obesity       PT Treatment Interventions DME instruction;Gait training;Stair training;Functional mobility training;Therapeutic activities;Therapeutic exercise;Balance training;Patient/family education    PT Goals (Current goals can be found in the Care Plan section)  Acute Rehab PT Goals Patient Stated Goal: return home PT Goal Formulation: With patient Time For Goal Achievement: 06/24/20 Potential to Achieve Goals: Good    Frequency Min 3X/week   Barriers to discharge  Co-evaluation               AM-PAC PT "6 Clicks" Mobility  Outcome Measure Help needed turning from your back to your side while in a flat bed without using bedrails?: None Help needed moving from lying on your back to sitting on the side of a flat bed without using  bedrails?: None Help needed moving to and from a bed to a chair (including a wheelchair)?: None Help needed standing up from a chair using your arms (e.g., wheelchair or bedside chair)?: None Help needed to walk in hospital room?: A Little Help needed climbing 3-5 steps with a railing? : A Little 6 Click Score: 22    End of Session   Activity Tolerance: Treatment limited secondary to medical complications (Comment) (HR 140s with supine<>sit) Patient left: in bed;with call bell/phone within reach;with family/visitor present Nurse Communication: Mobility status;Other (comment) (HR) PT Visit Diagnosis: Other abnormalities of gait and mobility (R26.89)    Time: 1550-1600 PT Time Calculation (min) (ACUTE ONLY): 10 min   Charges:   PT Evaluation $PT Eval Low Complexity: 1 Low           Tori Jakyria Bleau PT, DPT 06/10/20, 4:10 PM

## 2020-06-10 NOTE — H&P (Signed)
History and Physical   Patient: Kevin Goodman                            PCP: Lucia Gaskins, MD                    DOB: 03-23-1950            DOA: 06/10/2020 ALP:[id number]             DOS: 06/10/2020, 3:19 PM  Patient coming from:   HOME  I have personally reviewed patient's medical records, in electronic medical records, including:  [facility], and care everywhere.    Chief Complaint:   Chief Complaint  Patient presents with  . Shortness of Breath    History of present illness:    Kevin Goodman is a 70 y.o. male with medical history significant of obesity, DM 2, dysphagia, colon adenoma, GERD, hypertension, remote history of atrial fibrillation (occurring in 07/2011 with conversion back to NSR)  .... Was at the gastroenterologist office for 3 years evaluation of severe dysphagia, colonic adenoma where he was noted to be tachycardic, HR>140.... Patient was sent to ED   Upon  arrival Per ED records heart rate was high as 144, and a flutter was treated with Cardizem and metoprolol, patient did not convert, he became bradycardic, but heart rate came back up    Patient Denies having: Fever, Chills, Cough, SOB, Chest Pain, Abd pain, N/V/D, headache, dizziness, lightheadedness,  Dysuria, Joint pain, rash, open wounds  ED Course: Vitals: 97.4, pulse 114, satting 100% room air Weight 178.3 kg Abnormal labs; CBC CMP within normal rest with exception of creatinine 1.31, glucose 153, troponin normal 8     Review of Systems: As per HPI, otherwise 10 point review of systems were negative.   ----------------------------------------------------------------------------------------------------------------------  No Known Allergies  Home MEDs:  Prior to Admission medications   Medication Sig Start Date End Date Taking? Authorizing Provider  allopurinol (ZYLOPRIM) 300 MG tablet Take 300 mg by mouth daily.    [provider]  aspirin EC 81 MG EC tablet Take 1 tablet  (81 mg total) by mouth daily. 04/24/16   Ghimire, Henreitta Leber, MD  Famotidine (PEPCID PO) Take by mouth as needed.    [provider]  insulin aspart (NOVOLOG FLEXPEN) 100 UNIT/ML FlexPen CBG < 70: implement hypoglycemia protocol-call MD CBG 70 - 120: 0 units CBG 121 - 150: 2 units CBG 151 - 200: 3 units CBG 201 - 250: 5 units CBG 251 - 300: 8 units CBG 301 - 350: 11 units CBG 351 - 400: 15 units CBG > 400: call MD Patient not taking: No sig reported 04/24/16   Jonetta Osgood, MD  Insulin Glargine (LANTUS) 100 UNIT/ML Solostar Pen Inject 30 Units into the skin daily at 10 pm. 04/24/16   Ghimire, Henreitta Leber, MD  lidocaine (LIDODERM) 5 % Place 1 patch onto the skin daily. Remove & Discard patch within 12 hours or as directed by MD Patient not taking: No sig reported 01/07/17   Long, Wonda Olds, MD  metFORMIN (GLUCOPHAGE) 500 MG tablet Take 500 mg by mouth 2 (two) times daily with a meal.    [provider]  methocarbamol (ROBAXIN) 500 MG tablet Take 1 tablet (500 mg total) by mouth 2 (two) times daily. Patient not taking: No sig reported 01/07/17   Long, Wonda Olds, MD  Multiple Vitamin (MULTIVITAMIN WITH MINERALS)  TABS tablet Take 1 tablet by mouth.    [provider]  pantoprazole (PROTONIX) 40 MG tablet Take 1 tablet (40 mg total) by mouth daily. 30 minutes before breakfast 06/10/20   Annitta Needs, NP    PRN MEDs: sodium chloride, acetaminophen **OR** acetaminophen, bisacodyl, hydrALAZINE, HYDROmorphone (DILAUDID) injection, ipratropium, levalbuterol, magnesium citrate, ondansetron **OR** ondansetron (ZOFRAN) IV, oxyCODONE, senna-docusate, sodium chloride flush, traZODone  Past Medical History:  Diagnosis Date  . Atrial fibrillation ([place])   . Diabetes mellitus without complication ([place])   . GERD (gastroesophageal reflux disease)   . Gout   . Hypertension     Past Surgical History:  Procedure Laterality Date  . COLONOSCOPY N/A 12/08/2016   sigmoid and descending  colon diverticula, four semi-pedunculated polyps in descending and ascendign colon, few polyps in descending and ascending colon (tubular adenomas). 3 year surveillance.   . ESOPHAGOGASTRODUODENOSCOPY N/A 12/08/2016   normal esophagus s/p empiric dilation, small hiatal hernia.   Marland Kitchen FINGER SURGERY     left ring  . INCISION AND DRAINAGE OF WOUND Left 04/20/2016   Procedure: LEFT THIGH DEBRIDEMENT;  Surgeon: Mickeal Skinner, MD;  Location: [facility];  Service: General;  Laterality: Left;  Marland Kitchen MALONEY DILATION N/A 12/08/2016   Procedure: Venia Minks DILATION;  Surgeon: Daneil Dolin, MD;  [facility];  Service: Endoscopy;  Laterality: N/A;  . POLYPECTOMY  12/08/2016   Procedure: POLYPECTOMY;  Surgeon: Daneil Dolin, MD;  [facility];  Service: Endoscopy;;  colon     reports that he has quit smoking. His smoking use included cigars. He has never used smokeless tobacco. He reports current alcohol use. He reports that he does not use drugs.   Family History  Problem Relation Age of Onset  . Heart failure Mother   . Hypertension Mother   . Hypertension Father   . Stroke Father   . Prostate cancer Father   . Stroke Brother   . Prostate cancer Brother   . Colon cancer Neg Hx   . Colon polyps Neg Hx     Physical Exam:   Vitals:   06/10/20 1300 06/10/20 1400 06/10/20 1453 06/10/20 1456  BP: (!) 144/80 121/65    Pulse: (!) 47 (!) 48  (!) 139  Resp: 14 12  17   Temp:   98.4 F (36.9 C)   TempSrc:   Oral   SpO2: 100% 100%  100%  Weight:   (!) 175.2 kg   Height:   6\' 1"  (1.854 m)    Constitutional: NAD, calm, comfortable, obese male Eyes: PERRL, lids and conjunctivae normal ENMT: Mucous membranes are moist. Posterior pharynx clear of any exudate or lesions.Normal dentition.  Neck: normal, supple, no masses, no thyromegaly Respiratory: clear to auscultation bilaterally, no wheezing, no crackles. Normal respiratory effort. No accessory muscle use.  Cardiovascular:  Regular rate and rhythm, no murmurs / rubs / gallops. No extremity edema. 2+ pedal pulses. No carotid bruits.  Abdomen: no tenderness, no masses palpated. No hepatosplenomegaly. Bowel sounds positive.  Musculoskeletal: no clubbing / cyanosis. No joint deformity upper and lower extremities. Good ROM, no contractures. Normal muscle tone.  Neurologic: CN II-XII grossly intact. Sensation intact, DTR normal. Strength 5/5 in all 4.  Psychiatric: Normal judgment and insight. Alert and oriented x 3. Normal mood.  Skin: no rashes, lesions, ulcers. No induration Wounds: per nursing documentation         Labs on admission:    I have personally reviewed following labs and  imaging studies  CBC: Recent Labs  Lab 06/10/20 0945  WBC 10.3  NEUTROABS 5.4  HGB 13.4  HCT 42.3  MCV 95.9  PLT 086   Basic Metabolic Panel: Recent Labs  Lab 06/10/20 0945  NA 141  K 4.7  CL 108  CO2 24  GLUCOSE 153*  BUN 20  CREATININE 1.31*  CALCIUM 9.4   GFR: Estimated Creatinine Clearance: 91.3 mL/min (A) (by C-G formula based on SCr of 1.31 mg/dL (H)). Liver Function Tests: Recent Labs  Lab 06/10/20 0945  AST 33  ALT 64*  ALKPHOS 62  BILITOT 0.9  PROT 7.2  ALBUMIN 4.0  Urine analysis:    Component Value Date/Time   COLORURINE YELLOW 07/16/2011 [place] 07/16/2011 1407   LABSPEC 1.010 07/16/2011 1407   PHURINE 6.0 07/16/2011 1407   GLUCOSEU NEGATIVE 07/16/2011 1407   HGBUR TRACE (A) 07/16/2011 1407   BILIRUBINUR NEGATIVE 07/16/2011 1407   KETONESUR NEGATIVE 07/16/2011 1407   PROTEINUR NEGATIVE 07/16/2011 1407   UROBILINOGEN 0.2 07/16/2011 1407   NITRITE NEGATIVE 07/16/2011 1407   LEUKOCYTESUR SMALL (A) 07/16/2011 1407     Radiologic Exams on Admission:   DG Chest Port 1 View  Result Date: 06/10/2020 CLINICAL DATA:  Chest pain, shortness of breath, weakness EXAM: PORTABLE CHEST 1 VIEW COMPARISON:  01/07/2017 FINDINGS: The heart size and mediastinal contours are  within normal limits. Both lungs are clear. The visualized skeletal structures are unremarkable. IMPRESSION: No active disease. Electronically Signed   By: Miachel Roux M.D.   On: 06/10/2020 10:36    EKG:   Independently reviewed.  Orders placed or performed during the hospital encounter of 06/10/20  . EKG 12-Lead  . EKG 12-Lead  . EKG 12-Lead  . EKG 12-Lead  . EKG 12-Lead   ---------------------------------------------------------------------------------------------------------------------------------------    Assessment / Plan:   Active Problems:   Atrial fibrillation (H[facility])   Hypertension   Morbid obesity (HCC)   Type 2 diabetes mellitus (HCC)   Dysphagia   Colon adenomas   GERD (gastroesophageal reflux disease)   A-fib (HCC)    Atrial flutter/atrial fibrillation ([place]) -Possible acute onset -We will admitted to telemetry -ICU with close monitoring -We will continue Cardizem drip, with a goal of heart rate less than 100 -Patient's home medication reviewed labetalol metoprolol, at this point we will continue with labetalol -In ED patient was given a bolus of metoprolol and Cardizem patient did not convert -CHADS2-VASc score > 3 -Cardiologist consulted, appreciate input ..started him on Eliquis 5 mg twice daily and stop ASA   - last Echo 2013- WNL, EJF 55-60%, Grade I D Dysfunction  - f/up labs including TSH   Mild AKI -Creatinine mildly elevated to 1.31 -We will continue to monitor -Fluid hydration -Avoiding nephrotoxins    Hypertension -Stable -Holding home medication of Norvasc as patient is on Cardizem drip -Continue home medication of labetalol,    Morbid obesity ([place]) -Advised and counseled healthy diet exercise and weight loss    Type 2 diabetes mellitus ([place]) -Continue home medication of Lantus, Metformin, -Also will check her blood sugar QA CHS, with SSI coverage    Dysphagia  -with a history of esophageal stricture, dilatation 2018 -Evaluation  by GI - consulted  -Speech for inpatient evaluation   H/o Colon adenomas - H/o Colon adenomas and due for 3-year-surveillance with last colonoscopy Oct 2018.with empiric dilation also completed in 2018 -Outpatient follow-up with GI-Sees Dr. Cindie Laroche next week  GERD (gastroesophageal reflux disease) -continue tonics  History of gout - cont. allopurinol  H/o OSA - per patient can not tolerate CPAP    Cultures:  -none  Antimicrobial: -none   Consults called:  cardiology  ---------------------------------------------------------------------------------------------------------------------------------------- DVT prophylaxis: SCD/Compression stockings and Heparin SQ Code Status:   Code Status: Full Code   Admission status: Patient will be admitted as Observation, with a less than 2 midnight length of stay. Level of care: Telemetry   Family Communication:  none at bedside  (The above findings and plan of care has been discussed with patient in detail, the patient expressed understanding and agreement of above plan)  --------------------------------------------------------------------------------------------------------------------------------------------------  Disposition Plan:  Anticipated 1-2 days Status is: Observation  The patient remains OBS appropriate and will d/c before 2 midnights.  Dispo: The patient is from: Home              Anticipated d/c is to: Home              Patient currently is not medically stable to d/c.   Difficult to place patient No   ----------------------------------------------------------------------------------------------------------------------------------------------------  Time spent: > than  55  Min.   SIGNED: Deatra James, MD, FHM. [facility],  Pager (Please use [email or web address] to page to text)  If 7PM-7AM, please contact night-coverage [email or web address],  06/10/2020, 3:19 PM

## 2020-06-10 NOTE — ED Notes (Signed)
Pt complains of shortness of breath, and placed on 2 liters of Oxygen

## 2020-06-10 NOTE — Progress Notes (Signed)
SLP Cancellation Note  Patient Details Name: Kevin Goodman MRN: [id number] DOB: 12-15-1950   Cancelled treatment:       Reason Eval/Treat Not Completed: Other (comment) (Pt was moving from ED to ICU;); SLP unable to see Pt this date. Will check back tomorrow.   Thank you,  Genene Churn, [place]    [place] 06/10/2020, 4:49 PM

## 2020-06-10 NOTE — Therapy (Signed)
PT Cancellation Note  Patient Details Name: Kevin Goodman MRN: [id number] DOB: 24-Feb-1951   Cancelled Treatment:    Reason Eval/Treat Not Completed: Other (comment) (transferring to floor). Upon entry into room, nurse preparing to tranfer pt from ED to floor. Will check back as schedule permits.      Talbot Grumbling PT, DPT 06/10/20, 2:38 PM

## 2020-06-10 NOTE — Telephone Encounter (Signed)
Erline Levine, please arrange follow-up with Roseanne Kaufman, NP in 4 weeks. Dr. Jenetta Downer received call from the ED today due to dysphagia which he was seen for today in the office by Roseanne Kaufman, NP. He is now admitted with atrial fibrillation/flutter. Dysphagia can be managed outpatient. Dr. Jenetta Downer recommended follow-up in 4 weeks.

## 2020-06-10 NOTE — Consult Note (Addendum)
Cardiology Consultation:   Patient ID: KYSTON GONCE MRN: [id number]; DOB: 11-18-1950  Admit date: 06/10/2020 Date of Consult: 06/10/2020  PCP:  Lucia Gaskins, MD   [place]  Cardiologist: Evalauted by Dr. Domenic Polite in 07/2011 but no outpatient follow-up  Patient Profile:   MICK TANGUMA is a 70 y.o. male with past medical history of HTN, Type 2 DM, OSA (not on CPAP) and remote history of atrial fibrillation (occurring in 07/2011 with conversion back to NSR with IV Amiodarone and IV Cardizem) who is being seen today for the evaluation of atrial fibrillation with RVR at the request of Dr. Darrold Shelter and Dr. Roderic Palau.  History of Present Illness:   Mr. Mella presented to an outpatient GI appointment earlier this morning and reported having chronic shortness of breath and was found to be tachycardic with heart rate in the 140's to 150's.  He therefore presented to the ED for further evaluation. In talking with the patient today, he reports having worsening dyspnea on exertion over the past 3 to 4 months but has noticed worsening symptoms over the past week. Reports having an elevated heart rate as well when checked by his PCP but he is unsure if an EKG was performed. Reports they were going to schedule him for an echocardiogram but this has not yet been arranged. He denies any specific chest pain or palpitations. Reports more noticeable dyspnea on exertion and some orthopnea. He reports chronic lower extremity edema along his left leg but no progression of symptoms. He denies any alcohol use. He does consume 3-4 sodas on a daily basis.  Initial labs showed WBC 10.3, Hgb 13.4, platelets 313, Na+ 141, K+ 4.7 and creatinine 1.31. BNP 410. Initial HS Troponin 8 with repeat value pending. TSH and COVID testing pending. CXR with no active disease. EKG shows what appears to be most consistent with atrial flutter with RVR, HR 143.  He initially received IV Lopressor 5mg   along with IV Cardizem 10mg  with transient improvement in his HR. Has now been started on a Cardizem drip and his heart rate remains variable in the 90's to 130's.  Past Medical History:  Diagnosis Date  . Atrial fibrillation ([place])   . Diabetes mellitus without complication ([place])   . GERD (gastroesophageal reflux disease)   . Gout   . Hypertension     Past Surgical History:  Procedure Laterality Date  . COLONOSCOPY N/A 12/08/2016   sigmoid and descending colon diverticula, four semi-pedunculated polyps in descending and ascendign colon, few polyps in descending and ascending colon (tubular adenomas). 3 year surveillance.   . ESOPHAGOGASTRODUODENOSCOPY N/A 12/08/2016   normal esophagus s/p empiric dilation, small hiatal hernia.   Marland Kitchen FINGER SURGERY     left ring  . INCISION AND DRAINAGE OF WOUND Left 04/20/2016   Procedure: LEFT THIGH DEBRIDEMENT;  Surgeon: Mickeal Skinner, MD;  Location: [facility];  Service: General;  Laterality: Left;  Marland Kitchen MALONEY DILATION N/A 12/08/2016   Procedure: Venia Minks DILATION;  Surgeon: Daneil Dolin, MD;  [facility];  Service: Endoscopy;  Laterality: N/A;  . POLYPECTOMY  12/08/2016   Procedure: POLYPECTOMY;  Surgeon: Daneil Dolin, MD;  [facility];  Service: Endoscopy;;  colon     Home Medications:  Prior to Admission medications   Medication Sig Start Date End Date Taking? Authorizing Provider  allopurinol (ZYLOPRIM) 300 MG tablet Take 300 mg by mouth daily.   Yes [provider]  aspirin EC 81  MG EC tablet Take 1 tablet (81 mg total) by mouth daily. 04/24/16  Yes Ghimire, Henreitta Leber, MD  labetalol (NORMODYNE) 200 MG tablet Take 200 mg by mouth 2 (two) times daily. 06/07/20  Yes [provider]  metFORMIN (GLUCOPHAGE) 500 MG tablet Take 500 mg by mouth 2 (two) times daily with a meal.   Yes [provider]  metoprolol tartrate (LOPRESSOR) 50 MG tablet Take 50 mg by mouth 2 (two) times daily. 06/09/20  Yes  [provider]  Multiple Vitamin (MULTIVITAMIN WITH MINERALS) TABS tablet Take 1 tablet by mouth.   Yes [provider]  naproxen (NAPROSYN) 500 MG tablet Take 500 mg by mouth 2 (two) times daily. 06/07/20  Yes [provider]  TRESIBA FLEXTOUCH 100 UNIT/ML FlexTouch Pen Inject 30 Units into the skin daily at 6 (six) AM. 05/06/20  Yes [provider]  amLODipine (NORVASC) 5 MG tablet Take 1 tablet by mouth daily. 06/07/20   [provider]  insulin aspart (NOVOLOG FLEXPEN) 100 UNIT/ML FlexPen CBG < 70: implement hypoglycemia protocol-call MD CBG 70 - 120: 0 units CBG 121 - 150: 2 units CBG 151 - 200: 3 units CBG 201 - 250: 5 units CBG 251 - 300: 8 units CBG 301 - 350: 11 units CBG 351 - 400: 15 units CBG > 400: call MD Patient not taking: No sig reported 04/24/16   Ghimire, Henreitta Leber, MD  Insulin Glargine (LANTUS) 100 UNIT/ML Solostar Pen Inject 30 Units into the skin daily at 10 pm. Patient not taking: No sig reported 04/24/16   Jonetta Osgood, MD  lidocaine (LIDODERM) 5 % Place 1 patch onto the skin daily. Remove & Discard patch within 12 hours or as directed by MD Patient not taking: No sig reported 01/07/17   Long, Wonda Olds, MD  methocarbamol (ROBAXIN) 500 MG tablet Take 1 tablet (500 mg total) by mouth 2 (two) times daily. Patient not taking: No sig reported 01/07/17   Long, Wonda Olds, MD  pantoprazole (PROTONIX) 40 MG tablet Take 1 tablet (40 mg total) by mouth daily. 30 minutes before breakfast 06/10/20   Annitta Needs, NP    Inpatient Medications: Scheduled Meds: . allopurinol  300 mg Oral Daily  . apixaban  5 mg Oral BID  . insulin glargine  30 Units Subcutaneous Q2200  . labetalol  200 mg Oral BID  . metFORMIN  500 mg Oral BID WC  . multivitamin with minerals  1 tablet Oral Daily  . pantoprazole  40 mg Oral Daily  . sodium chloride flush  3 mL Intravenous Q12H  . sodium chloride flush  3 mL Intravenous Q12H   Continuous Infusions: .  sodium chloride 100 mL/hr at 06/10/20 1140  . sodium chloride    . diltiazem (CARDIZEM) infusion 5 mg/hr (06/10/20 1125)   PRN Meds: sodium chloride, acetaminophen **OR** acetaminophen, bisacodyl, hydrALAZINE, HYDROmorphone (DILAUDID) injection, ipratropium, levalbuterol, magnesium citrate, ondansetron **OR** ondansetron (ZOFRAN) IV, oxyCODONE, senna-docusate, sodium chloride flush, traZODone  Allergies:   No Known Allergies  Social History:   Social History   Socioeconomic History  . Marital status: Widowed    Spouse name: Not on file  . Number of children: Not on file  . Years of education: Not on file  . Highest education level: Not on file  Occupational History  . Occupation: Public house manager: [employer]  Tobacco Use  . Smoking status: Former Smoker    Types: Cigars  . Smokeless tobacco: Never Used  Vaping Use  . Vaping Use: Never used  Substance and Sexual Activity  . Alcohol use: Yes    Comment: occasional shot of liquour every now and then   . Drug use: No  . Sexual activity: Yes  Other Topics Concern  . Not on file  Social History Narrative  . Not on file   Social Determinants of Health   Financial Resource Strain: Not on file  Food Insecurity: Not on file  Transportation Needs: Not on file  Physical Activity: Not on file  Stress: Not on file  Social Connections: Not on file  Intimate Partner Violence: Not on file    Family History:    Family History  Problem Relation Age of Onset  . Heart failure Mother   . Hypertension Mother   . Hypertension Father   . Stroke Father   . Prostate cancer Father   . Stroke Brother   . Prostate cancer Brother   . Colon cancer Neg Hx   . Colon polyps Neg Hx      ROS:  Please see the history of present illness.   All other ROS reviewed and negative.     Physical Exam/Data:   Vitals:   06/10/20 0936 06/10/20 1100 06/10/20 1143 06/10/20 1253  BP: (!) 148/92 (!) 143/79 (!) 157/135 131/81  Pulse: (!)  144 86 (!) 57 (!) 46  Resp: 20 15 15 19   Temp: (!) 97.4 F (36.3 C)     TempSrc: Oral     SpO2: 100% 100% 98% 97%  Weight:      Height:       No intake or output data in the 24 hours ending 06/10/20 1259 Last 3 Weights 06/10/2020 06/10/2020 06/09/2020  Weight (lbs) 393 lb 393 lb 3.2 oz 376 lb 12.8 oz  Weight (kg) 178.264 kg 178.354 kg 170.915 kg     Body mass index is 51.85 kg/m.  General:  Well nourished, obese male appearing in no acute distress.  HEENT: normal Lymph: no adenopathy Neck: no JVD Endocrine:  No thryomegaly Vascular: No carotid bruits; FA pulses 2+ bilaterally without bruits  Cardiac:  normal S1, S2; Irregularly irregular Lungs:  clear to auscultation bilaterally, no wheezing, rhonchi or rales  Abd: soft, nontender, no hepatomegaly  Ext: 1+ pitting edema along LLE Musculoskeletal:  No deformities, BUE and BLE strength normal and equal Skin: warm and dry  Neuro:  CNs 2-12 intact, no focal abnormalities noted Psych:  Normal affect   EKG:  The EKG was personally reviewed and demonstrates: Atrial flutter with RVR, HR 143.  Relevant CV Studies:  Echocardiogram: 07/2011 Study Conclusions   - Left ventricle: The cavity size was normal. Wall thickness  was increased in a pattern of severe LVH. Systolic  function was normal. The estimated ejection fraction was  in the range of 55% to 60%. Wall motion was normal; there  were no regional wall motion abnormalities. Doppler  parameters are consistent with abnormal left ventricular  relaxation (grade 1 diastolic dysfunction).  - Left atrium: The atrium was mildly dilated.    Laboratory Data:  High Sensitivity Troponin:   Recent Labs  Lab 06/10/20 0945  TROPONINIHS 8     Chemistry Recent Labs  Lab 06/10/20 0945  NA 141  K 4.7  CL 108  CO2 24  GLUCOSE 153*  BUN 20  CREATININE 1.31*  CALCIUM 9.4  GFRNONAA 60*  ANIONGAP 9    Recent Labs  Lab 06/10/20 0945  PROT 7.2  ALBUMIN  4.0  AST 33   ALT 64*  ALKPHOS 62  BILITOT 0.9   Hematology Recent Labs  Lab 06/10/20 0945  WBC 10.3  RBC 4.41  HGB 13.4  HCT 42.3  MCV 95.9  MCH 30.4  MCHC 31.7  RDW 15.9*  PLT 313   BNP Recent Labs  Lab 06/10/20 0945  BNP 410.0*    DDimer No results for input(s): DDIMER in the last 168 hours.   Radiology/Studies:  DG Chest Port 1 View  Result Date: 06/10/2020 CLINICAL DATA:  Chest pain, shortness of breath, weakness EXAM: PORTABLE CHEST 1 VIEW COMPARISON:  01/07/2017 FINDINGS: The heart size and mediastinal contours are within normal limits. Both lungs are clear. The visualized skeletal structures are unremarkable. IMPRESSION: No active disease. Electronically Signed   By: Miachel Roux M.D.   On: 06/10/2020 10:36     Assessment and Plan:   1. Atrial Flutter with RVR - He has a history of atrial fibrillation/flutter occurring in 2013 and required Cardizem along with Amiodarone for conversion back to normal sinus rhythm. He is not a candidate for immediate DCCV while in the ED as he reports ongoing tachycardia for over a week but unclear duration. - K+ normal at 4.7.  Mg and TSH are pending. BNP elevated at 410 and will give IV Lasix 40mg  x1 and monitor response. Would plan to obtain an updated echocardiogram once his rates have improved but he is currently experiencing variable rates from the 90's to 130's. Will discuss antiarrhythmic medication options vs. possible TEE/DCCV later this admission with Dr. Harl Bowie. - Continue IV Cardizem for now.  Appears PO Labetalol 200 mg twice daily has already been ordered by the admitting team.  Reviewed the indication for anticoagulation with the patient and he is in agreement to starting this.  Will start Eliquis 5 mg twice daily and stop ASA 81 mg daily for now.  2. HTN - BP has been variable from 131/81 -157/135 while in the ED.  Continue to follow with initiation of IV Cardizem. Would hold PTA Amlodipine for now given the use of Cardizem.  3.  OSA - He reports being diagnosed with this in the past but was previously intolerant to CPAP.  He says he would be open to trying this again, therefore would plan for referral for a repeat sleep study as an outpatient given his body habitus and cardiac arrhythmias.    Risk Assessment/Risk Scores:   CHA2DS2-VASc Score = 3  This indicates a 3.2% annual risk of stroke. The patient's score is based upon: CHF History: No HTN History: Yes Diabetes History: Yes Stroke History: No Vascular Disease History: No Age Score: 1 Gender Score: 0      For questions or updates, please contact [place] Please consult [email or web address] for contact info under    Signed, Erma Heritage, PA-C  06/10/2020 12:59 PM   Attending note Patient seen and discussed with PA Ahmed Prima, I agree with her documentation. 69 yo male history of HTN, DM2, OSA, prior afib presents with SOB and tachycardia. From 2013 cardiology notes had a history of paroxysmal afib and aflutter then on coumadin. No cardiology notes since that time, no longer on anticoag on admission.    WBC 10.3 Hgb 13.4 Plt 313 Cr 1.31 BUN 20 BNP 410 TSH 4 Trop 8--> CXR no acute process EKG aflutter RVR 2013 echo LVEF 55-60%, grade I dd  Long history of paroxysmal afib/aflutter, no follow up since 2013 from cardiology standpoint and appear he  is off anticoagulation. Will restart eliquis 5mg  bid. Started on IV dilt gtt for rate control, would d/c his home latebalol to allow [place] to use av nodal agents with stronger rate effects and less bp effects at higher doses to try and control his arrhythmia. If fails rate control may consider TEE/DCCV as inpatient later in the week. Repeat echo once rates better controlled. Follow diuresis with IV lasix x 1.  Carlyle Dolly MD

## 2020-06-10 NOTE — Progress Notes (Signed)
Lab called critical result of a positive covid test on this patient.   Patient aware of results.  Dr. Denton Brick made aware.

## 2020-06-10 NOTE — Patient Instructions (Signed)
I recommend going to the emergency room due to your heart continuing to be rapidly. I have let them know you are coming.  We will see you back in clinic to arrange colonoscopy/endoscopy/dilation but need to make sure you are stable from a heart standpoint.  I have sent in Protonix to take once each morning, 30 minutes before breakfast.   We will see you back soon!    I enjoyed seeing you again today! As you know, I value our relationship and want to provide genuine, compassionate, and quality care. I welcome your feedback. If you receive a survey regarding your visit,  I greatly appreciate you taking time to fill this out. See you next time!  Annitta Needs, PhD, ANP-BC [facility]

## 2020-06-10 NOTE — ED Provider Notes (Signed)
[facility] EMERGENCY DEPARTMENT Provider Note   CSN: [id number] Arrival date & time: 06/10/20  6387     History Chief Complaint  Patient presents with  . Shortness of Breath    Kevin Goodman is a 70 y.o. male.  Patient complains of some shortness of breath he was seen at the GI office today and found to have a heart rate of about 150.  Patient has a history of atrial fib and is taking a beta-blocker  The history is provided by the patient and medical records. No language interpreter was used.  Shortness of Breath Severity:  Moderate Onset quality:  Sudden Timing:  Intermittent Progression:  Waxing and waning Chronicity:  New Context: activity   Relieved by:  Nothing Worsened by:  Nothing Ineffective treatments:  None tried Associated symptoms: no abdominal pain, no chest pain, no cough, no headaches and no rash        Past Medical History:  Diagnosis Date  . Atrial fibrillation ([place])   . Diabetes mellitus without complication ([place])   . GERD (gastroesophageal reflux disease)   . Gout   . Hypertension     Patient Active Problem List   Diagnosis Date Noted  . 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 GERD (gastroesophageal reflux disease) 06/10/2020  . Dysphagia 11/11/2016  . Encounter for screening colonoscopy 11/11/2016  . Abscess 04/19/2016  . Long term (current) use of anticoagulants 07/29/2011  . Type 2 diabetes mellitus ([place]) 07/20/2011  . Atrial fibrillation ([place]) 07/19/2011  . Hypertension 07/19/2011  . Morbid obesity ([place]) 07/19/2011    Past Surgical History:  Procedure Laterality Date  . COLONOSCOPY N/A 12/08/2016   sigmoid and descending colon diverticula, four semi-pedunculated polyps in descending and ascendign colon, few polyps in descending and ascending colon (tubular adenomas). 3 year surveillance.   . ESOPHAGOGASTRODUODENOSCOPY N/A 12/08/2016   normal esophagus s/p empiric dilation, small hiatal hernia.   Marland Kitchen FINGER SURGERY     left ring  . INCISION  AND DRAINAGE OF WOUND Left 04/20/2016   Procedure: LEFT THIGH DEBRIDEMENT;  Surgeon: Mickeal Skinner, MD;  Location: [place];  Service: General;  Laterality: Left;  Marland Kitchen MALONEY DILATION N/A 12/08/2016   Procedure: Venia Minks DILATION;  Surgeon: Daneil Dolin, MD;  [facility];  Service: Endoscopy;  Laterality: N/A;  . POLYPECTOMY  12/08/2016   Procedure: POLYPECTOMY;  Surgeon: Daneil Dolin, MD;  [facility];  Service: Endoscopy;;  colon       Family History  Problem Relation Age of Onset  . Heart failure Mother   . Hypertension Mother   . Hypertension Father   . Stroke Father   . Prostate cancer Father   . Stroke Brother   . Prostate cancer Brother   . Colon cancer Neg Hx   . Colon polyps Neg Hx     Social History   Tobacco Use  . Smoking status: Former Smoker    Types: Cigars  . Smokeless tobacco: Never Used  Vaping Use  . Vaping Use: Never used  Substance Use Topics  . Alcohol use: Yes    Comment: occasional shot of liquour every now and then   . Drug use: No    Home Medications Prior to Admission medications   Medication Sig Start Date End Date Taking? Authorizing Provider  allopurinol (ZYLOPRIM) 300 MG tablet Take 300 mg by mouth daily.    [provider]  aspirin EC 81 MG EC tablet Take 1 tablet (81 mg total)  by mouth daily. 04/24/16   Ghimire, Henreitta Leber, MD  Famotidine (PEPCID PO) Take by mouth as needed.    [provider]  insulin aspart (NOVOLOG FLEXPEN) 100 UNIT/ML FlexPen CBG < 70: implement hypoglycemia protocol-call MD CBG 70 - 120: 0 units CBG 121 - 150: 2 units CBG 151 - 200: 3 units CBG 201 - 250: 5 units CBG 251 - 300: 8 units CBG 301 - 350: 11 units CBG 351 - 400: 15 units CBG > 400: call MD Patient not taking: No sig reported 04/24/16   Jonetta Osgood, MD  Insulin Glargine (LANTUS) 100 UNIT/ML Solostar Pen Inject 30 Units into the skin daily at 10 pm. 04/24/16   Ghimire, Henreitta Leber, MD  lidocaine  (LIDODERM) 5 % Place 1 patch onto the skin daily. Remove & Discard patch within 12 hours or as directed by MD Patient not taking: No sig reported 01/07/17   Long, Wonda Olds, MD  metFORMIN (GLUCOPHAGE) 500 MG tablet Take 500 mg by mouth 2 (two) times daily with a meal.    [provider]  methocarbamol (ROBAXIN) 500 MG tablet Take 1 tablet (500 mg total) by mouth 2 (two) times daily. Patient not taking: No sig reported 01/07/17   Long, Wonda Olds, MD  Multiple Vitamin (MULTIVITAMIN WITH MINERALS) TABS tablet Take 1 tablet by mouth.    [provider]  pantoprazole (PROTONIX) 40 MG tablet Take 1 tablet (40 mg total) by mouth daily. 30 minutes before breakfast 06/10/20   Annitta Needs, NP    Allergies    Patient has no known allergies.  Review of Systems   Review of Systems  Constitutional: Negative for appetite change and fatigue.  HENT: Negative for congestion, ear discharge and sinus pressure.   Eyes: Negative for discharge.  Respiratory: Positive for shortness of breath. Negative for cough.   Cardiovascular: Negative for chest pain.  Gastrointestinal: Negative for abdominal pain and diarrhea.  Genitourinary: Negative for frequency and hematuria.  Musculoskeletal: Negative for back pain.  Skin: Negative for rash.  Neurological: Negative for seizures and headaches.  Psychiatric/Behavioral: Negative for hallucinations.    Physical Exam Updated Vital Signs BP (!) 143/79   Pulse 86   Temp (!) 97.4 F (36.3 C) (Oral)   Resp 15   Ht 6\' 1"  (1.854 m)   Wt (!) 178.3 kg   SpO2 100%   BMI 51.85 kg/m   Physical Exam Vitals and nursing note reviewed.  Constitutional:      General: He is in acute distress.     Appearance: He is well-developed.  HENT:     Head: Normocephalic.     Nose: Nose normal.  Eyes:     General: No scleral icterus.    Conjunctiva/sclera: Conjunctivae normal.  Neck:     Thyroid: No thyromegaly.  Cardiovascular:     Heart sounds: No murmur  heard. No friction rub. No gallop.      Comments: Rapid rate which is regular at a rate of about 150 Pulmonary:     Breath sounds: No stridor. No wheezing or rales.  Chest:     Chest wall: No tenderness.  Abdominal:     General: There is no distension.     Tenderness: There is no abdominal tenderness. There is no rebound.  Musculoskeletal:        General: Normal range of motion.     Cervical back: Neck supple.  Lymphadenopathy:     Cervical: No cervical adenopathy.  Skin:  Findings: No erythema or rash.  Neurological:     Mental Status: He is alert and oriented to person, place, and time.     Motor: No abnormal muscle tone.     Coordination: Coordination normal.  Psychiatric:        Behavior: Behavior normal.     ED Results / Procedures / Treatments   Labs (all labs ordered are listed, but only abnormal results are displayed) Labs Reviewed  CBC WITH DIFFERENTIAL/PLATELET - Abnormal; Notable for the following components:      Result Value   RDW 15.9 (*)    All other components within normal limits  COMPREHENSIVE METABOLIC PANEL - Abnormal; Notable for the following components:   Glucose, Bld 153 (*)    Creatinine, Ser 1.31 (*)    ALT 64 (*)    GFR, Estimated 60 (*)    All other components within normal limits  HIV ANTIBODY (ROUTINE TESTING W REFLEX)  BRAIN NATRIURETIC PEPTIDE  TSH  TROPONIN I (HIGH SENSITIVITY)  TROPONIN I (HIGH SENSITIVITY)    EKG None  Radiology DG Chest Port 1 View  Result Date: 06/10/2020 CLINICAL DATA:  Chest pain, shortness of breath, weakness EXAM: PORTABLE CHEST 1 VIEW COMPARISON:  01/07/2017 FINDINGS: The heart size and mediastinal contours are within normal limits. Both lungs are clear. The visualized skeletal structures are unremarkable. IMPRESSION: No active disease. Electronically Signed   By: Miachel Roux M.D.   On: 06/10/2020 10:36    Procedures Procedures   Medications Ordered in ED Medications  diltiazem (CARDIZEM) 125 mg  in dextrose 5% 125 mL (1 mg/mL) infusion (has no administration in time range)  heparin injection 5,000 Units (has no administration in time range)  sodium chloride flush (NS) 0.9 % injection 3 mL (has no administration in time range)  0.9 %  sodium chloride infusion (has no administration in time range)  sodium chloride flush (NS) 0.9 % injection 3 mL (has no administration in time range)  sodium chloride flush (NS) 0.9 % injection 3 mL (has no administration in time range)  0.9 %  sodium chloride infusion (has no administration in time range)  acetaminophen (TYLENOL) tablet 650 mg (has no administration in time range)    Or  acetaminophen (TYLENOL) suppository 650 mg (has no administration in time range)  oxyCODONE (Oxy IR/ROXICODONE) immediate release tablet 5 mg (has no administration in time range)  HYDROmorphone (DILAUDID) injection 0.5-1 mg (has no administration in time range)  traZODone (DESYREL) tablet 25 mg (has no administration in time range)  senna-docusate (Senokot-S) tablet 1 tablet (has no administration in time range)  bisacodyl (DULCOLAX) EC tablet 5 mg (has no administration in time range)  magnesium citrate solution 1 Bottle (has no administration in time range)  ondansetron (ZOFRAN) tablet 4 mg (has no administration in time range)    Or  ondansetron (ZOFRAN) injection 4 mg (has no administration in time range)  ipratropium (ATROVENT) nebulizer solution 0.5 mg (has no administration in time range)  levalbuterol (XOPENEX) nebulizer solution 0.63 mg (has no administration in time range)  hydrALAZINE (APRESOLINE) injection 10 mg (has no administration in time range)  metoprolol tartrate (LOPRESSOR) injection 5 mg (5 mg Intravenous Given 06/10/20 1003)  methylPREDNISolone sodium succinate (SOLU-MEDROL) 125 mg/2 mL injection 125 mg (125 mg Intravenous Given 06/10/20 1023)  diltiazem (CARDIZEM) injection 10 mg (10 mg Intravenous Given 06/10/20 1024)   CRITICAL CARE Performed by:  Milton Ferguson Total critical care time: 45 minutes Critical care time was exclusive of  separately billable procedures and treating other patients. Critical care was necessary to treat or prevent imminent or life-threatening deterioration. Critical care was time spent personally by me on the following activities: development of treatment plan with patient and/or surrogate as well as nursing, discussions with consultants, evaluation of patient's response to treatment, examination of patient, obtaining history from patient or surrogate, ordering and performing treatments and interventions, ordering and review of laboratory studies, ordering and review of radiographic studies, pulse oximetry and re-evaluation of patient's condition.  ED Course  I have reviewed the triage vital signs and the nursing notes.  Pertinent labs & imaging results that were available during my care of the patient were reviewed by me and considered in my medical decision making (see chart for details).    MDM Rules/Calculators/A&P                          Patient with atrial flutter.  He was given a beta-blocker and that did not help.  His rates been around 150.  He was given 10 mg of Cardizem that slowed his rate down to 50 but then eventually went back up to 110.  He is now placed on a Cardizem infusion.  Cardiology has been called and they will consult on the patient.  Patient will be admitted to medicine Final Clinical Impression(s) / ED Diagnoses Final diagnoses:  Atrial flutter, unspecified type [facility])    Rx / DC Orders ED Discharge Orders    None       Milton Ferguson, MD 06/10/20 1126

## 2020-06-10 NOTE — Plan of Care (Signed)
  Problem: Acute Rehab PT Goals(only PT should resolve) Goal: Pt Will Go Supine/Side To Sit Outcome: Progressing Flowsheets (Taken 06/10/2020 1611) Pt will go Supine/Side to Sit: with modified independence Goal: Patient Will Transfer Sit To/From Stand Outcome: Progressing Flowsheets (Taken 06/10/2020 1611) Patient will transfer sit to/from stand: with modified independence Goal: Pt Will Transfer Bed To Chair/Chair To Bed Outcome: Progressing Flowsheets (Taken 06/10/2020 1611) Pt will Transfer Bed to Chair/Chair to Bed: with modified independence Goal: Pt Will Ambulate Outcome: Progressing Flowsheets (Taken 06/10/2020 1611) Pt will Ambulate:  > 125 feet  with supervision   Kevin Goodman PT, DPT 06/10/20, 4:12 PM

## 2020-06-10 NOTE — ED Triage Notes (Signed)
Pt presents to ED. Pt went to scheduled GI appointment today, HR noted to be 160-170's, pt denies chest pain but states has been getting SOB when he walks.

## 2020-06-11 ENCOUNTER — Inpatient Hospital Stay (HOSPITAL_COMMUNITY): Payer: Medicare PPO

## 2020-06-11 DIAGNOSIS — R1319 Other dysphagia: Secondary | ICD-10-CM

## 2020-06-11 DIAGNOSIS — E1165 Type 2 diabetes mellitus with hyperglycemia: Secondary | ICD-10-CM

## 2020-06-11 DIAGNOSIS — I4891 Unspecified atrial fibrillation: Secondary | ICD-10-CM

## 2020-06-11 DIAGNOSIS — I4892 Unspecified atrial flutter: Secondary | ICD-10-CM

## 2020-06-11 DIAGNOSIS — U071 COVID-19: Secondary | ICD-10-CM

## 2020-06-11 DIAGNOSIS — Z794 Long term (current) use of insulin: Secondary | ICD-10-CM

## 2020-06-11 DIAGNOSIS — J1282 Pneumonia due to coronavirus disease 2019: Secondary | ICD-10-CM

## 2020-06-11 LAB — D-DIMER, QUANTITATIVE: D-Dimer, Quant: 0.27 ug/mL-FEU (ref 0.00–0.50)

## 2020-06-11 LAB — ECHOCARDIOGRAM COMPLETE
AR max vel: 3.06 cm2
AV Area VTI: 2.57 cm2
AV Area mean vel: 2.7 cm2
AV Mean grad: 1.9 mmHg
AV Peak grad: 3 mmHg
Ao pk vel: 0.87 m/s
Area-P 1/2: 4.44 cm2
Height: 73 in
S' Lateral: 2.81 cm
Weight: 6204.63 oz

## 2020-06-11 LAB — CBC WITH DIFFERENTIAL/PLATELET
Abs Immature Granulocytes: 0.08 10*3/uL — ABNORMAL HIGH (ref 0.00–0.07)
Basophils Absolute: 0 10*3/uL (ref 0.0–0.1)
Basophils Relative: 0 %
Eosinophils Absolute: 0 10*3/uL (ref 0.0–0.5)
Eosinophils Relative: 0 %
HCT: 41.3 % (ref 39.0–52.0)
Hemoglobin: 13.4 g/dL (ref 13.0–17.0)
Immature Granulocytes: 1 %
Lymphocytes Relative: 22 %
Lymphs Abs: 3 10*3/uL (ref 0.7–4.0)
MCH: 31.2 pg (ref 26.0–34.0)
MCHC: 32.4 g/dL (ref 30.0–36.0)
MCV: 96.3 fL (ref 80.0–100.0)
Monocytes Absolute: 0.8 10*3/uL (ref 0.1–1.0)
Monocytes Relative: 6 %
Neutro Abs: 9.9 10*3/uL — ABNORMAL HIGH (ref 1.7–7.7)
Neutrophils Relative %: 71 %
Platelets: 290 10*3/uL (ref 150–400)
RBC: 4.29 MIL/uL (ref 4.22–5.81)
RDW: 15.9 % — ABNORMAL HIGH (ref 11.5–15.5)
WBC: 13.8 10*3/uL — ABNORMAL HIGH (ref 4.0–10.5)
nRBC: 0 % (ref 0.0–0.2)

## 2020-06-11 LAB — COMPREHENSIVE METABOLIC PANEL
ALT: 64 U/L — ABNORMAL HIGH (ref 0–44)
AST: 32 U/L (ref 15–41)
Albumin: 3.9 g/dL (ref 3.5–5.0)
Alkaline Phosphatase: 51 U/L (ref 38–126)
Anion gap: 11 (ref 5–15)
BUN: 28 mg/dL — ABNORMAL HIGH (ref 8–23)
CO2: 24 mmol/L (ref 22–32)
Calcium: 9.4 mg/dL (ref 8.9–10.3)
Chloride: 103 mmol/L (ref 98–111)
Creatinine, Ser: 1.42 mg/dL — ABNORMAL HIGH (ref 0.61–1.24)
GFR, Estimated: 54 mL/min — ABNORMAL LOW (ref >60)
Glucose, Bld: 168 mg/dL — ABNORMAL HIGH (ref 70–99)
Potassium: 4.7 mmol/L (ref 3.5–5.1)
Sodium: 138 mmol/L (ref 135–145)
Total Bilirubin: 1 mg/dL (ref 0.3–1.2)
Total Protein: 6.8 g/dL (ref 6.5–8.1)

## 2020-06-11 LAB — FERRITIN: Ferritin: 33 ng/mL (ref 24–336)

## 2020-06-11 LAB — MAGNESIUM: Magnesium: 2 mg/dL (ref 1.7–2.4)

## 2020-06-11 LAB — BASIC METABOLIC PANEL
Anion gap: 12 (ref 5–15)
BUN: 27 mg/dL — ABNORMAL HIGH (ref 8–23)
CO2: 24 mmol/L (ref 22–32)
Calcium: 9.3 mg/dL (ref 8.9–10.3)
Chloride: 101 mmol/L (ref 98–111)
Creatinine, Ser: 1.39 mg/dL — ABNORMAL HIGH (ref 0.61–1.24)
GFR, Estimated: 56 mL/min — ABNORMAL LOW (ref >60)
Glucose, Bld: 167 mg/dL — ABNORMAL HIGH (ref 70–99)
Potassium: 4.7 mmol/L (ref 3.5–5.1)
Sodium: 137 mmol/L (ref 135–145)

## 2020-06-11 LAB — MRSA PCR SCREENING: MRSA by PCR: NEGATIVE

## 2020-06-11 LAB — PROTIME-INR
INR: 1.5 — ABNORMAL HIGH (ref 0.8–1.2)
Prothrombin Time: 17.2 seconds — ABNORMAL HIGH (ref 11.4–15.2)

## 2020-06-11 LAB — HEMOGLOBIN A1C
Hgb A1c MFr Bld: 7.4 % — ABNORMAL HIGH (ref 4.8–5.6)
Mean Plasma Glucose: 165.68 mg/dL

## 2020-06-11 LAB — GLUCOSE, CAPILLARY: Glucose-Capillary: 159 mg/dL — ABNORMAL HIGH (ref 70–99)

## 2020-06-11 LAB — PROCALCITONIN: Procalcitonin: <0.1 ng/mL

## 2020-06-11 LAB — C-REACTIVE PROTEIN: CRP: 0.6 mg/dL (ref <1.0)

## 2020-06-11 MED ORDER — SALINE SPRAY 0.65 % NA SOLN
1.0000 | NASAL | Status: DC | PRN
  Administered 2020-06-11: 1 via NASAL
  Filled 2020-06-11: qty 44

## 2020-06-11 MED ORDER — DILTIAZEM HCL 60 MG PO TABS
60.0000 mg | ORAL_TABLET | Freq: Three times a day (TID) | ORAL | Status: DC
  Administered 2020-06-11 (×3): 60 mg via ORAL
  Filled 2020-06-11 (×3): qty 1

## 2020-06-11 MED ORDER — SODIUM CHLORIDE 0.9 % IV SOLN
100.0000 mg | Freq: Every day | INTRAVENOUS | Status: DC
  Administered 2020-06-12 – 2020-06-13 (×2): 100 mg via INTRAVENOUS
  Filled 2020-06-11 (×4): qty 20

## 2020-06-11 MED ORDER — HYDRALAZINE HCL 20 MG/ML IJ SOLN
10.0000 mg | Freq: Four times a day (QID) | INTRAMUSCULAR | Status: DC | PRN
  Administered 2020-06-11: 10 mg via INTRAVENOUS
  Filled 2020-06-11: qty 1

## 2020-06-11 MED ORDER — SODIUM CHLORIDE 0.9 % IV SOLN
100.0000 mg | INTRAVENOUS | Status: AC
  Administered 2020-06-11 (×2): 100 mg via INTRAVENOUS
  Filled 2020-06-11 (×2): qty 20

## 2020-06-11 NOTE — Evaluation (Signed)
Clinical/Bedside Swallow Evaluation Patient Details  Name: Kevin Goodman MRN: [id number] Date of Birth: May 14, 1950  Today's Date: 06/11/2020 Time: SLP Start Time (ACUTE ONLY): 0845 SLP Stop Time (ACUTE ONLY): 0910 SLP Time Calculation (min) (ACUTE ONLY): 25 min  Past Medical History:  Past Medical History:  Diagnosis Date  . Atrial fibrillation ([place])   . Diabetes mellitus without complication ([place])   . GERD (gastroesophageal reflux disease)   . Gout   . Hypertension    Past Surgical History:  Past Surgical History:  Procedure Laterality Date  . COLONOSCOPY N/A 12/08/2016   sigmoid and descending colon diverticula, four semi-pedunculated polyps in descending and ascendign colon, few polyps in descending and ascending colon (tubular adenomas). 3 year surveillance.   . ESOPHAGOGASTRODUODENOSCOPY N/A 12/08/2016   normal esophagus s/p empiric dilation, small hiatal hernia.   Marland Kitchen FINGER SURGERY     left ring  . INCISION AND DRAINAGE OF WOUND Left 04/20/2016   Procedure: LEFT THIGH DEBRIDEMENT;  Surgeon: Mickeal Skinner, MD;  Location: [place];  Service: General;  Laterality: Left;  Marland Kitchen MALONEY DILATION N/A 12/08/2016   Procedure: Venia Minks DILATION;  Surgeon: Daneil Dolin, MD;  [facility];  Service: Endoscopy;  Laterality: N/A;  . POLYPECTOMY  12/08/2016   Procedure: POLYPECTOMY;  Surgeon: Daneil Dolin, MD;  [facility];  Service: Endoscopy;;  colon   HPI:  70 year old male with a history of diabetes mellitus type 2, dysphagia, hypertension, remote atrial fibrillation, and morbid obesity presenting with elevated heart rate.  The patient was at his gastroenterology office when he was noted to have heart rate >140.  He was directed to the emergency department for further evaluation.  In the emergency department, EKG showed atrial flutter with heart rate in the 140s.  The patient states that he has been having palpitations with increase in all exertional dyspnea for  the past 3 to 4 days.  In addition, the patient has noted an increasing cough with yellow sputum.  The patient himself denied any chest pain, fevers, chills, headache, nausea, vomiting, direct abdominal pain.  He has noted some increasing lower extremity edema over the past several weeks.  In the emergency department, the patient was given metoprolol IV and diltiazem IV.  He remained in atrial flutter.  Cardiology was consulted to assist with management.  Notably, the patient was noted to have a positive Covid RT-PCR. Feels like food is being hung up in esophagus. Rare pill dysphagia unless taking too many at one time. Has difficulty with rice, junk food, popcorn, chips. Wife passed in 2018 from cancer. He had a hard time recuperating after this with his own health needs. Noted improvement in dilation historically. BSE requested.   Assessment / Plan / Recommendation Clinical Impression  Clinical swallow evaluation completed in room. Oral motor examination is WNL. Pt assessed with ice chips, thin water via cup/straw, puree, and regular textures all self presented. Pt did not exhibit any signs or symptoms of aspiration during assessment. He denies pharyngeal swallowing deficits, but does endorse occasional "sticking" in chest when eating solid foods. He states that he thinks he needs to chew his food better. Pt with h/o esophageal dilation in the past and he reports this alleviated symptoms. Pt's current swallowing complaints appear more related to esophageal dysphagia. Pt was encouraged to sit upright for all eating/drinking and remain up for at least 30 minutes after (he eats in a recliner and lays down after eating at home per Pt),  masticate solids well, and follow solids with liquids as needed. No further SLP services indicated at this time, however consider GI follow up as an outpatient if his esophageal symptoms persist despite modifications stated above. Recommend regular textures and thin liquids. SLP will  sign off. Pt is in agreement with plan of care.   SLP Visit Diagnosis: Dysphagia, unspecified (R13.10)    Aspiration Risk  No limitations    Diet Recommendation Regular;Thin liquid   Liquid Administration via: Cup;Straw Medication Administration: Whole meds with liquid Supervision: Patient able to self feed Postural Changes: Seated upright at 90 degrees;Remain upright for at least 30 minutes after po intake    Other  Recommendations Recommended Consults: Consider esophageal assessment (could be completed as an outpatient/later date) Oral Care Recommendations: Oral care BID;Patient independent with oral care Other Recommendations: Clarify dietary restrictions   Follow up Recommendations None      Frequency and Duration            Prognosis Prognosis for Safe Diet Advancement: Good      Swallow Study   General Date of Onset: 06/10/20 HPI: 70 year old male with a history of diabetes mellitus type 2, dysphagia, hypertension, remote atrial fibrillation, and morbid obesity presenting with elevated heart rate.  The patient was at his gastroenterology office when he was noted to have heart rate >140.  He was directed to the emergency department for further evaluation.  In the emergency department, EKG showed atrial flutter with heart rate in the 140s.  The patient states that he has been having palpitations with increase in all exertional dyspnea for the past 3 to 4 days.  In addition, the patient has noted an increasing cough with yellow sputum.  The patient himself denied any chest pain, fevers, chills, headache, nausea, vomiting, direct abdominal pain.  He has noted some increasing lower extremity edema over the past several weeks.  In the emergency department, the patient was given metoprolol IV and diltiazem IV.  He remained in atrial flutter.  Cardiology was consulted to assist with management.  Notably, the patient was noted to have a positive Covid RT-PCR. Feels like food is being  hung up in esophagus. Rare pill dysphagia unless taking too many at one time. Has difficulty with rice, junk food, popcorn, chips. Wife passed in 2018 from cancer. He had a hard time recuperating after this with his own health needs. Noted improvement in dilation historically. BSE requested. Type of Study: Bedside Swallow Evaluation Diet Prior to this Study: Regular;Thin liquids Temperature Spikes Noted: No Respiratory Status: Room air History of Recent Intubation: No Behavior/Cognition: Alert;Cooperative;Pleasant mood Oral Cavity Assessment: Within Functional Limits Oral Care Completed by SLP: No Oral Cavity - Dentition: Adequate natural dentition Vision: Functional for self-feeding Self-Feeding Abilities: Able to feed self Patient Positioning: Upright in chair Baseline Vocal Quality: Normal Volitional Cough: Strong Volitional Swallow: Able to elicit    Oral/Motor/Sensory Function Overall Oral Motor/Sensory Function: Within functional limits   Ice Chips Ice chips: Within functional limits Presentation: Spoon   Thin Liquid Thin Liquid: Within functional limits Presentation: Straw;Cup;Self Fed    Nectar Thick Nectar Thick Liquid: Not tested   Honey Thick Honey Thick Liquid: Not tested   Puree Puree: Within functional limits Presentation: Spoon   Solid     Solid: Within functional limits Presentation: Self Fed     Thank you,  Genene Churn, [place]  Rodolfo Gaster 06/11/2020,11:17 AM

## 2020-06-11 NOTE — Progress Notes (Signed)
*  PRELIMINARY RESULTS* Echocardiogram 2D Echocardiogram has been performed.  Kevin Goodman 06/11/2020, 1:37 PM

## 2020-06-11 NOTE — Progress Notes (Signed)
PROGRESS NOTE  Kevin Goodman:[id number] DOB: 30-Aug-1950 DOA: 06/10/2020 PCP: Lucia Gaskins, MD  Brief History:  70 year old male with a history of diabetes mellitus type 2, dysphagia, hypertension, remote atrial fibrillation, and morbid obesity presenting with elevated heart rate.  The patient was at his gastroenterology office when he was noted to have heart rate >140.  He was directed to the emergency department for further evaluation.  In the emergency department, EKG showed atrial flutter with heart rate in the 140s.  The patient states that he has been having palpitations with increase in all exertional dyspnea for the past 3 to 4 days.  In addition, the patient has noted an increasing cough with yellow sputum.  The patient himself denied any chest pain, fevers, chills, headache, nausea, vomiting, direct abdominal pain.  He has noted some increasing lower extremity edema over the past several weeks.  In the emergency department, the patient was given metoprolol IV and diltiazem IV.  He remained in atrial flutter.  Cardiology was consulted to assist with management.  Notably, the patient was noted to have a positive Covid RT-PCR.  Assessment/Plan: Atrial flutter with RVR -Currently on diltiazem drip -Plan on transitioning to oral diltiazem -TSH 4.010 -Echo -CHADS-VASc = 3 -Continue apixaban -appreciate cardiology consult  COVID-19 pneumonia -Personally reviewed chest x-ray--increased interstitial markings -The patient was incidentally positive -He has been vaccinated x3 -Start remdesivir -CRP -Ferritin -D-dimer -Currently stable on room air -Vitamin C and zinc  Essential hypertension -Discontinue amlodipine -Continue diltiazem  Uncontrolled diabetes mellitus type 2 with hyperglycemia -04/21/2016 hemoglobin A1c 14.1 -NovoLog sliding scale -Continue Lantus  Morbid obesity -BMI 51.16 -Lifestyle modification  Gouty arthritis -Continue  allopurinol      Status is: Inpatient  Remains inpatient appropriate because:IV treatments appropriate due to intensity of illness or inability to take PO   Dispo: The patient is from: Home              Anticipated d/c is to: Home              Patient currently is not medically stable to d/c.   Difficult to place patient No        Family Communication:   No Family at bedside  Consultants:  cardiology  Code Status:  FULL   DVT Prophylaxis:  apixaban   Procedures: As Listed in Progress Note Above  Antibiotics: None       Subjective: Patient complains of cough with yellow sputum.  He denies any fevers, chills, headache, chest pain, nausea, vomiting, diarrhea, abdominal pain, dysuria, hematuria.  Objective: Vitals:   06/11/20 0100 06/11/20 0200 06/11/20 0300 06/11/20 0500  BP:   136/68   Pulse: (!) 59 (!) 135 71   Resp: (!) 21 (!) 28 13   Temp:      TempSrc:      SpO2: 98% 97% 100%   Weight:    (!) 175.9 kg  Height:        Intake/Output Summary (Last 24 hours) at 06/11/2020 0732 Last data filed at 06/11/2020 0318 Gross per 24 hour  Intake 540.5 ml  Output 1000 ml  Net -459.5 ml   Weight change:  Exam:   General:  Pt is alert, follows commands appropriately, not in acute distress  HEENT: No icterus, No thrush, No neck mass, [place]/AT  Cardiovascular: IRRR, S1/S2, no rubs, no gallops  Respiratory: bibasilar rales. No wheeze  Abdomen: Soft/+BS, non tender, non distended, no  guarding  Extremities: 1 + LEedema, No lymphangitis, No petechiae, No rashes, no synovitis   Data Reviewed: I have personally reviewed following labs and imaging studies Basic Metabolic Panel: Recent Labs  Lab 06/10/20 0945  NA 141  K 4.7  CL 108  CO2 24  GLUCOSE 153*  BUN 20  CREATININE 1.31*  CALCIUM 9.4   Liver Function Tests: Recent Labs  Lab 06/10/20 0945  AST 33  ALT 64*  ALKPHOS 62  BILITOT 0.9  PROT 7.2  ALBUMIN 4.0   No results for input(s):  LIPASE, AMYLASE in the last 168 hours. No results for input(s): AMMONIA in the last 168 hours. Coagulation Profile: No results for input(s): INR, PROTIME in the last 168 hours. CBC: Recent Labs  Lab 06/10/20 0945  WBC 10.3  NEUTROABS 5.4  HGB 13.4  HCT 42.3  MCV 95.9  PLT 313   Cardiac Enzymes: No results for input(s): CKTOTAL, CKMB, CKMBINDEX, TROPONINI in the last 168 hours. BNP: Invalid input(s): POCBNP CBG: Recent Labs  Lab 06/10/20 1419 06/11/20 0006  GLUCAP 154* 159*   HbA1C: No results for input(s): HGBA1C in the last 72 hours. Urine analysis:    Component Value Date/Time   COLORURINE YELLOW 07/16/2011 [place] 07/16/2011 1407   LABSPEC 1.010 07/16/2011 1407   PHURINE 6.0 07/16/2011 1407   GLUCOSEU NEGATIVE 07/16/2011 1407   HGBUR TRACE (A) 07/16/2011 1407   BILIRUBINUR NEGATIVE 07/16/2011 1407   KETONESUR NEGATIVE 07/16/2011 1407   PROTEINUR NEGATIVE 07/16/2011 1407   UROBILINOGEN 0.2 07/16/2011 1407   NITRITE NEGATIVE 07/16/2011 1407   LEUKOCYTESUR SMALL (A) 07/16/2011 1407   Sepsis Labs: @LABRCNTIP (procalcitonin:4,lacticidven:4) ) Recent Results (from the past 240 hour(s))  Resp Panel by RT-PCR (Flu A&B, Covid) Nasopharyngeal Swab     Status: Abnormal   Collection Time: 06/10/20  1:27 PM   Specimen: Nasopharyngeal Swab; Nasopharyngeal(NP) swabs in vial transport medium  Result Value Ref Range Status   SARS Coronavirus 2 by RT PCR POSITIVE (A) NEGATIVE Final    Comment: RESULT CALLED TO, READ BACK BY AND VERIFIED WITH: MURPHY,E RN @1901  06/10/20 BILLINGSLEY,L (NOTE) SARS-CoV-2 target nucleic acids are DETECTED.  The SARS-CoV-2 RNA is generally detectable in upper respiratory specimens during the acute phase of infection. Positive results are indicative of the presence of the identified virus, but do not rule out bacterial infection or co-infection with other pathogens not detected by the test. Clinical correlation with patient  history and other diagnostic information is necessary to determine patient infection status. The expected result is Negative.  Fact Sheet for Patients: EntrepreneurPulse.com.au  Fact Sheet for Healthcare Providers: IncredibleEmployment.be  This test is not yet approved or cleared by the [place] FDA and  has been authorized for detection and/or diagnosis of SARS-CoV-2 by FDA under an Emergency Use Authorization (EUA).  This EUA will remain in effect (meaning this test c an be used) for the duration of  the COVID-19 declaration under Section 564(b)(1) of the Act, 21 U.S.C. section 360bbb-3(b)(1), unless the authorization is terminated or revoked sooner.     Influenza A by PCR NEGATIVE NEGATIVE Final   Influenza B by PCR NEGATIVE NEGATIVE Final    Comment: (NOTE) The Xpert Xpress SARS-CoV-2/FLU/RSV plus assay is intended as an aid in the diagnosis of influenza from Nasopharyngeal swab specimens and should not be used as a sole basis for treatment. Nasal washings and aspirates are unacceptable for Xpert Xpress SARS-CoV-2/FLU/RSV testing.  Fact Sheet for Patients: EntrepreneurPulse.com.au  Fact Sheet  for Healthcare Providers: IncredibleEmployment.be  This test is not yet approved or cleared by the [place] and has been authorized for detection and/or diagnosis of SARS-CoV-2 by FDA under an Emergency Use Authorization (EUA). This EUA will remain in effect (meaning this test can be used) for the duration of the COVID-19 declaration under Section 564(b)(1) of the Act, 21 U.S.C. section 360bbb-3(b)(1), unless the authorization is terminated or revoked.  Performed at [facility], [address], [place], [place] [address]      Scheduled Meds: . allopurinol  300 mg Oral Daily  . apixaban  5 mg Oral BID  . vitamin C  500 mg Oral Daily  . Chlorhexidine Gluconate Cloth  6 each Topical Q0600  .  insulin glargine  30 Units Subcutaneous Q2200  . metFORMIN  500 mg Oral BID WC  . multivitamin with minerals  1 tablet Oral Daily  . pantoprazole  40 mg Oral Daily  . sodium chloride flush  3 mL Intravenous Q12H  . sodium chloride flush  3 mL Intravenous Q12H  . zinc sulfate  220 mg Oral Daily   Continuous Infusions: . sodium chloride    . diltiazem (CARDIZEM) infusion 15 mg/hr (06/11/20 0318)    Procedures/Studies: DG Chest Port 1 View  Result Date: 06/10/2020 CLINICAL DATA:  Chest pain, shortness of breath, weakness EXAM: PORTABLE CHEST 1 VIEW COMPARISON:  01/07/2017 FINDINGS: The heart size and mediastinal contours are within normal limits. Both lungs are clear. The visualized skeletal structures are unremarkable. IMPRESSION: No active disease. Electronically Signed   By: Miachel Roux M.D.   On: 06/10/2020 10:36    Orson Eva, DO  [facility]  If 7PM-7AM, please contact night-coverage [email or web address] Password [email or web address] 06/11/2020, 7:32 AM   LOS: 1 day

## 2020-06-11 NOTE — Telephone Encounter (Signed)
Patient is scheduled and ICU will let him know at discharge

## 2020-06-11 NOTE — Progress Notes (Signed)
Progress Note  Patient Name: TYRANN DONAHO Date of Encounter: 06/11/2020  [facility] Cardiologist: New, Dr Harl Bowie  Subjective   No events overnight Inpatient Medications    Scheduled Meds: . allopurinol  300 mg Oral Daily  . apixaban  5 mg Oral BID  . vitamin C  500 mg Oral Daily  . Chlorhexidine Gluconate Cloth  6 each Topical Q0600  . insulin glargine  30 Units Subcutaneous Q2200  . metFORMIN  500 mg Oral BID WC  . multivitamin with minerals  1 tablet Oral Daily  . pantoprazole  40 mg Oral Daily  . sodium chloride flush  3 mL Intravenous Q12H  . sodium chloride flush  3 mL Intravenous Q12H  . zinc sulfate  220 mg Oral Daily   Continuous Infusions: . sodium chloride    . diltiazem (CARDIZEM) infusion 15 mg/hr (06/11/20 0318)   PRN Meds: sodium chloride, acetaminophen **OR** acetaminophen, albuterol, alum & mag hydroxide-simeth, bisacodyl, guaiFENesin-dextromethorphan, hydrALAZINE, HYDROmorphone (DILAUDID) injection, ipratropium, levalbuterol, magnesium citrate, ondansetron **OR** ondansetron (ZOFRAN) IV, oxyCODONE, senna-docusate, sodium chloride, sodium chloride flush, traZODone   Vital Signs    Vitals:   06/11/20 0100 06/11/20 0200 06/11/20 0300 06/11/20 0500  BP:   136/68   Pulse: (!) 59 (!) 135 71   Resp: (!) 21 (!) 28 13   Temp:      TempSrc:      SpO2: 98% 97% 100%   Weight:    (!) 175.9 kg  Height:        Intake/Output Summary (Last 24 hours) at 06/11/2020 0805 Last data filed at 06/11/2020 0318 Gross per 24 hour  Intake 540.5 ml  Output 1000 ml  Net -459.5 ml   Last 3 Weights 06/11/2020 06/10/2020 06/10/2020  Weight (lbs) 387 lb 12.6 oz 386 lb 3.9 oz 393 lb  Weight (kg) 175.9 kg 175.2 kg 178.264 kg      Telemetry    Aflutter variable rates- Personally Reviewed  ECG    n/a- Personally Reviewed  Physical Exam  COVID + patient, exam deferred to day to limit unneccesary medical contacts  Labs    High Sensitivity Troponin:   Recent Labs  Lab  06/10/20 0945 06/10/20 1149  TROPONINIHS 8 8      Chemistry Recent Labs  Lab 06/10/20 0945 06/11/20 0650  NA 141 138  137  K 4.7 4.7  4.7  CL 108 103  101  CO2 24 24  24   GLUCOSE 153* 168*  167*  BUN 20 28*  27*  CREATININE 1.31* 1.42*  1.39*  CALCIUM 9.4 9.4  9.3  PROT 7.2 6.8  ALBUMIN 4.0 3.9  AST 33 32  ALT 64* 64*  ALKPHOS 62 51  BILITOT 0.9 1.0  GFRNONAA 60* 54*  56*  ANIONGAP 9 11  12      Hematology Recent Labs  Lab 06/10/20 0945 06/11/20 0650  WBC 10.3 13.8*  RBC 4.41 4.29  HGB 13.4 13.4  HCT 42.3 41.3  MCV 95.9 96.3  MCH 30.4 31.2  MCHC 31.7 32.4  RDW 15.9* 15.9*  PLT 313 290    BNP Recent Labs  Lab 06/10/20 0945  BNP 410.0*     DDimer No results for input(s): DDIMER in the last 168 hours.   Radiology    DG Chest Port 1 View  Result Date: 06/10/2020 CLINICAL DATA:  Chest pain, shortness of breath, weakness EXAM: PORTABLE CHEST 1 VIEW COMPARISON:  01/07/2017 FINDINGS: The heart size and mediastinal contours are within  normal limits. Both lungs are clear. The visualized skeletal structures are unremarkable. IMPRESSION: No active disease. Electronically Signed   By: Miachel Roux M.D.   On: 06/10/2020 10:36    Cardiac Studies    Patient Profile     GREEN QUINCY is a 70 y.o. male with past medical history of HTN, Type 2 DM, OSA (not on CPAP) and remote history of atrial fibrillation (occurring in 07/2011 with conversion back to NSR with IV Amiodarone and IV Cardizem) who is being seen today for the evaluation of atrial fibrillation with RVR at the request of Dr. Tobyn Shelter and Dr. Roderic Palau.  Assessment & Plan    1. Atrial flutter with RVR - history of prior paroxysmal afib and aflutter as documented in 2013 cardiology notes. Had been on coumadin at that time. No f/u in several years, was off anticoag at admission - presented with aflutter with RVR - started on dilt gtt yesterday, currnetly at rate of 15 mg/hr - started on eliquis  5mg  bid.   - COVID +, would not be eligible for TEE/DCCV except in urgent indication - would look to rate control, consider DCCV in 3 weeks on anticoag.  - start oral dilt 60mg  tid, wean dilt gtt.   2. SOB  - signs of fluid overload on admission - received IV lasix 40mg  x 1, neg 569mL. Mild uptrend in Cr.   3. COVID + - subsequent COVID testing came back positive.  - management per primary team    For questions or updates, please contact Joanna Please consult [email or web address] for contact info under        Signed, Carlyle Dolly, MD  06/11/2020, 8:05 AM

## 2020-06-12 DIAGNOSIS — U071 COVID-19: Secondary | ICD-10-CM | POA: Diagnosis not present

## 2020-06-12 DIAGNOSIS — I4892 Unspecified atrial flutter: Secondary | ICD-10-CM | POA: Diagnosis not present

## 2020-06-12 DIAGNOSIS — D126 Benign neoplasm of colon, unspecified: Secondary | ICD-10-CM | POA: Diagnosis not present

## 2020-06-12 LAB — COMPREHENSIVE METABOLIC PANEL
ALT: 59 U/L — ABNORMAL HIGH (ref 0–44)
AST: 31 U/L (ref 15–41)
Albumin: 3.8 g/dL (ref 3.5–5.0)
Alkaline Phosphatase: 50 U/L (ref 38–126)
Anion gap: 10 (ref 5–15)
BUN: 25 mg/dL — ABNORMAL HIGH (ref 8–23)
CO2: 27 mmol/L (ref 22–32)
Calcium: 9.4 mg/dL (ref 8.9–10.3)
Chloride: 102 mmol/L (ref 98–111)
Creatinine, Ser: 1.4 mg/dL — ABNORMAL HIGH (ref 0.61–1.24)
GFR, Estimated: 55 mL/min — ABNORMAL LOW (ref >60)
Glucose, Bld: 130 mg/dL — ABNORMAL HIGH (ref 70–99)
Potassium: 4.3 mmol/L (ref 3.5–5.1)
Sodium: 139 mmol/L (ref 135–145)
Total Bilirubin: 0.8 mg/dL (ref 0.3–1.2)
Total Protein: 6.5 g/dL (ref 6.5–8.1)

## 2020-06-12 LAB — CBC WITH DIFFERENTIAL/PLATELET
Abs Immature Granulocytes: 0.05 10*3/uL (ref 0.00–0.07)
Basophils Absolute: 0.1 10*3/uL (ref 0.0–0.1)
Basophils Relative: 1 %
Eosinophils Absolute: 0.1 10*3/uL (ref 0.0–0.5)
Eosinophils Relative: 1 %
HCT: 43.8 % (ref 39.0–52.0)
Hemoglobin: 13.7 g/dL (ref 13.0–17.0)
Immature Granulocytes: 0 %
Lymphocytes Relative: 41 %
Lymphs Abs: 5 10*3/uL — ABNORMAL HIGH (ref 0.7–4.0)
MCH: 29.8 pg (ref 26.0–34.0)
MCHC: 31.3 g/dL (ref 30.0–36.0)
MCV: 95.2 fL (ref 80.0–100.0)
Monocytes Absolute: 0.9 10*3/uL (ref 0.1–1.0)
Monocytes Relative: 7 %
Neutro Abs: 6.2 10*3/uL (ref 1.7–7.7)
Neutrophils Relative %: 50 %
Platelets: 323 10*3/uL (ref 150–400)
RBC: 4.6 MIL/uL (ref 4.22–5.81)
RDW: 15.6 % — ABNORMAL HIGH (ref 11.5–15.5)
WBC: 12.3 10*3/uL — ABNORMAL HIGH (ref 4.0–10.5)
nRBC: 0 % (ref 0.0–0.2)

## 2020-06-12 LAB — C-REACTIVE PROTEIN: CRP: 0.5 mg/dL (ref <1.0)

## 2020-06-12 LAB — D-DIMER, QUANTITATIVE: D-Dimer, Quant: 0.3 ug/mL-FEU (ref 0.00–0.50)

## 2020-06-12 LAB — FERRITIN: Ferritin: 39 ng/mL (ref 24–336)

## 2020-06-12 LAB — GLUCOSE, CAPILLARY: Glucose-Capillary: 178 mg/dL — ABNORMAL HIGH (ref 70–99)

## 2020-06-12 MED ORDER — GUAIFENESIN ER 600 MG PO TB12
1200.0000 mg | ORAL_TABLET | Freq: Two times a day (BID) | ORAL | Status: DC
  Administered 2020-06-12 – 2020-06-14 (×4): 1200 mg via ORAL
  Filled 2020-06-12 (×4): qty 2

## 2020-06-12 MED ORDER — DILTIAZEM HCL 60 MG PO TABS
90.0000 mg | ORAL_TABLET | Freq: Four times a day (QID) | ORAL | Status: DC
  Administered 2020-06-12 (×4): 90 mg via ORAL
  Filled 2020-06-12 (×4): qty 1

## 2020-06-12 MED ORDER — METOPROLOL TARTRATE 25 MG PO TABS
25.0000 mg | ORAL_TABLET | Freq: Three times a day (TID) | ORAL | Status: DC
  Administered 2020-06-12 (×2): 25 mg via ORAL
  Filled 2020-06-12 (×2): qty 1

## 2020-06-12 NOTE — Progress Notes (Signed)
Physical Therapy Treatment Patient Details Name: Kevin Goodman MRN: [id number] DOB: 1950-05-05 Today's Date: 06/12/2020    History of Present Illness Pt is a 70 yo male presents from GI office due to SOB and heart rate of 150. PMH: afib, HTN, diabetes    PT Comments    Patient functioning near baseline for functional mobility and gait other than HR increasing from 110 BPM to up to 157 BPM during ambulation, otherwise ambulated in room without loss of balance and demonstrates good return for getting into/out of bed and transferring to chair - RN notified.  Plan:  Patient discharged from physical therapy to care of nursing for ambulation daily as tolerated for length of stay.    Follow Up Recommendations  No PT follow up     Equipment Recommendations       Recommendations for Other Services       Precautions / Restrictions Precautions Precautions: None Precaution Comments: monitor HR Restrictions Weight Bearing Restrictions: No    Mobility  Bed Mobility Overal bed mobility: Modified Independent             General bed mobility comments: demonstrates good return for getting into/out of bed without use of bed rails and bed flat    Transfers Overall transfer level: Modified independent               General transfer comment: increased time for completing sit to stands from chair, no problem from bedside  Ambulation/Gait Ambulation/Gait assistance: Modified independent (Device/Increase time) Gait Distance (Feet): 75 Feet Assistive device: None;IV Pole Gait Pattern/deviations: WFL(Within Functional Limits) Gait velocity: slightly decreased   General Gait Details: demonstrates good return for ambulation in room without AD with no loss of balance and good return for pushing IV pole while walking   Stairs             Wheelchair Mobility    Modified Rankin (Stroke Patients Only)       Balance Overall balance assessment: No apparent balance deficits  (not formally assessed)                                          Cognition Arousal/Alertness: Awake/alert Behavior During Therapy: WFL for tasks assessed/performed Overall Cognitive Status: Within Functional Limits for tasks assessed                                        Exercises      General Comments        Pertinent Vitals/Pain Pain Assessment: No/denies pain    Home Living                      Prior Function            PT Goals (current goals can now be found in the care plan section) Acute Rehab PT Goals Patient Stated Goal: return home PT Goal Formulation: With patient Time For Goal Achievement: 06/12/20 Potential to Achieve Goals: Good Progress towards PT goals: Goals met/education completed, patient discharged from PT    Frequency           PT Plan Other (comment) (Patient discharged to care of nursing for ambulation daily as tolerated)    Co-evaluation  AM-PAC PT "6 Clicks" Mobility   Outcome Measure  Help needed turning from your back to your side while in a flat bed without using bedrails?: None Help needed moving from lying on your back to sitting on the side of a flat bed without using bedrails?: None Help needed moving to and from a bed to a chair (including a wheelchair)?: None Help needed standing up from a chair using your arms (e.g., wheelchair or bedside chair)?: None Help needed to walk in hospital room?: None Help needed climbing 3-5 steps with a railing? : A Little 6 Click Score: 23    End of Session   Activity Tolerance: Patient tolerated treatment well;Other (comment) (limited due to HR increasing above 150 BPM) Patient left: in chair;with call bell/phone within reach Nurse Communication: Mobility status PT Visit Diagnosis: Unsteadiness on feet (R26.81);Other abnormalities of gait and mobility (R26.89);Muscle weakness (generalized) (M62.81)     Time: 1915-5027 PT  Time Calculation (min) (ACUTE ONLY): 22 min  Charges:  $Gait Training: 8-22 mins $Therapeutic Activity: 8-22 mins                     3:22 PM, 06/12/20 Lonell Grandchild, MPT Physical Therapist with [facility] [phone] office [phone] mobile phone

## 2020-06-12 NOTE — Progress Notes (Signed)
Progress Note  Patient Name: Kevin Goodman Date of Encounter: 06/12/2020  [facility] Cardiologist: New, Dr Harl Bowie  Subjective   N0 events overnight  Inpatient Medications    Scheduled Meds: . allopurinol  300 mg Oral Daily  . apixaban  5 mg Oral BID  . vitamin C  500 mg Oral Daily  . Chlorhexidine Gluconate Cloth  6 each Topical Q0600  . diltiazem  60 mg Oral TID  . insulin glargine  30 Units Subcutaneous Q2200  . multivitamin with minerals  1 tablet Oral Daily  . pantoprazole  40 mg Oral Daily  . sodium chloride flush  3 mL Intravenous Q12H  . sodium chloride flush  3 mL Intravenous Q12H  . zinc sulfate  220 mg Oral Daily   Continuous Infusions: . sodium chloride    . diltiazem (CARDIZEM) infusion 10 mg/hr (06/11/20 2355)  . remdesivir 100 mg in NS 100 mL     PRN Meds: sodium chloride, acetaminophen **OR** acetaminophen, albuterol, alum & mag hydroxide-simeth, bisacodyl, guaiFENesin-dextromethorphan, hydrALAZINE, HYDROmorphone (DILAUDID) injection, ipratropium, levalbuterol, magnesium citrate, ondansetron **OR** ondansetron (ZOFRAN) IV, oxyCODONE, senna-docusate, sodium chloride, sodium chloride flush, traZODone   Vital Signs    Vitals:   06/12/20 0200 06/12/20 0300 06/12/20 0327 06/12/20 0649  BP: (!) 136/47 (!) 166/80  (!) 162/76  Pulse:  70    Resp: (!) 21 15    Temp:   (!) 97.5 F (36.4 C)   TempSrc:   Oral   SpO2: 100% 100%    Weight:   (!) 175.9 kg   Height:        Intake/Output Summary (Last 24 hours) at 06/12/2020 0748 Last data filed at 06/12/2020 0327 Gross per 24 hour  Intake 122.62 ml  Output 1175 ml  Net -1052.38 ml   Last 3 Weights 06/12/2020 06/11/2020 06/10/2020  Weight (lbs) 387 lb 12.6 oz 387 lb 12.6 oz 386 lb 3.9 oz  Weight (kg) 175.9 kg 175.9 kg 175.2 kg      Telemetry    Aflutter variable rates - Personally Reviewed  ECG    Physical Exam  Exam deferred in COVID + patient to limit unneccesary medical contact  Labs    High  Sensitivity Troponin:   Recent Labs  Lab 06/10/20 0945 06/10/20 1149  TROPONINIHS 8 8      Chemistry Recent Labs  Lab 06/10/20 0945 06/11/20 0650 06/12/20 0638  NA 141 138  137 139  K 4.7 4.7  4.7 4.3  CL 108 103  101 102  CO2 24 24  24 27   GLUCOSE 153* 168*  167* 130*  BUN 20 28*  27* 25*  CREATININE 1.31* 1.42*  1.39* 1.40*  CALCIUM 9.4 9.4  9.3 9.4  PROT 7.2 6.8 6.5  ALBUMIN 4.0 3.9 3.8  AST 33 32 31  ALT 64* 64* 59*  ALKPHOS 62 51 50  BILITOT 0.9 1.0 0.8  GFRNONAA 60* 54*  56* 55*  ANIONGAP 9 11  12 10      Hematology Recent Labs  Lab 06/10/20 0945 06/11/20 0650 06/12/20 0638  WBC 10.3 13.8* 12.3*  RBC 4.41 4.29 4.60  HGB 13.4 13.4 13.7  HCT 42.3 41.3 43.8  MCV 95.9 96.3 95.2  MCH 30.4 31.2 29.8  MCHC 31.7 32.4 31.3  RDW 15.9* 15.9* 15.6*  PLT 313 290 323    BNP Recent Labs  Lab 06/10/20 0945  BNP 410.0*     DDimer  Recent Labs  Lab 06/11/20 0650  DDIMER  0.27     Radiology    DG Chest Port 1 View  Result Date: 06/10/2020 CLINICAL DATA:  Chest pain, shortness of breath, weakness EXAM: PORTABLE CHEST 1 VIEW COMPARISON:  01/07/2017 FINDINGS: The heart size and mediastinal contours are within normal limits. Both lungs are clear. The visualized skeletal structures are unremarkable. IMPRESSION: No active disease. Electronically Signed   By: Miachel Roux M.D.   On: 06/10/2020 10:36   ECHOCARDIOGRAM COMPLETE  Result Date: 06/11/2020    ECHOCARDIOGRAM REPORT   Patient Name:   Kevin Goodman Date of Exam: 06/11/2020 Medical Rec #:  [id number]        Height:       73.0 in Accession #:    [id number]       Weight:       387.8 lb Date of Birth:  [birth year]         BSA:          2.851 m Patient Age:    70 years         BP:           184/98 mmHg Patient Gender: M                HR:           94 bpm. Exam Location:  [facility] Procedure: 2D Echo Indications:    Atrial Fibrillation I48.91  History:        Patient has prior history of Echocardiogram  examinations, most                 recent 07/19/2011. Arrythmias:Atrial Flutter and Atrial                 Fibrillation; Risk Factors:Former Smoker, Hypertension and                 Diabetes. Pneumonia.  Sonographer:    Leavy Cella RDCS (AE) Referring Phys: [phone] DAVID TAT IMPRESSIONS  1. Left ventricular ejection fraction, by estimation, is 55 to 60%. The left ventricle has normal function. The left ventricle has no regional wall motion abnormalities. There is moderate left ventricular hypertrophy. Left ventricular diastolic parameters are indeterminate.  2. Right ventricular systolic function is normal. The right ventricular size is normal.  3. The mitral valve is normal in structure. No evidence of mitral valve regurgitation. No evidence of mitral stenosis.  4. The aortic valve is tricuspid. There is mild calcification of the aortic valve. There is mild thickening of the aortic valve. Aortic valve regurgitation is not visualized. No aortic stenosis is present.  5. Mild pulmonary HTN, PASP is 31 mmHg.  6. The inferior vena cava is dilated in size with >50% respiratory variability, suggesting right atrial pressure of 8 mmHg. FINDINGS  Left Ventricle: Left ventricular ejection fraction, by estimation, is 55 to 60%. The left ventricle has normal function. The left ventricle has no regional wall motion abnormalities. The left ventricular internal cavity size was normal in size. There is  moderate left ventricular hypertrophy. Left ventricular diastolic parameters are indeterminate. Right Ventricle: The right ventricular size is normal. No increase in right ventricular wall thickness. Right ventricular systolic function is normal. Left Atrium: Left atrial size was normal in size. Right Atrium: Right atrial size was normal in size. Pericardium: There is no evidence of pericardial effusion. Mitral Valve: The mitral valve is normal in structure. No evidence of mitral valve regurgitation. No evidence of mitral valve  stenosis. Tricuspid Valve: The tricuspid valve is  normal in structure. Tricuspid valve regurgitation is mild . No evidence of tricuspid stenosis. Aortic Valve: The aortic valve is tricuspid. There is mild calcification of the aortic valve. There is mild thickening of the aortic valve. There is mild aortic valve annular calcification. Aortic valve regurgitation is not visualized. No aortic stenosis  is present. Aortic valve mean gradient measures 1.9 mmHg. Aortic valve peak gradient measures 3.0 mmHg. Aortic valve area, by VTI measures 2.57 cm. Pulmonic Valve: The pulmonic valve was not well visualized. Pulmonic valve regurgitation is not visualized. No evidence of pulmonic stenosis. Aorta: The aortic root is normal in size and structure. Pulmonary Artery: Mild pulmonary HTN, PASP is 31 mmHg. Venous: The inferior vena cava is dilated in size with greater than 50% respiratory variability, suggesting right atrial pressure of 8 mmHg. IAS/Shunts: The interatrial septum was not well visualized.  LEFT VENTRICLE PLAX 2D LVIDd:         4.09 cm  Diastology LVIDs:         2.81 cm  LV e' medial:    9.46 cm/s LV PW:         1.40 cm  LV E/e' medial:  14.4 LV IVS:        1.42 cm  LV e' lateral:   14.70 cm/s LVOT diam:     2.10 cm  LV E/e' lateral: 9.3 LV SV:         51 LV SV Index:   18 LVOT Area:     3.46 cm  RIGHT VENTRICLE RV S prime:     8.45 cm/s TAPSE (M-mode): 2.1 cm LEFT ATRIUM             Index       RIGHT ATRIUM           Index LA diam:        4.30 cm 1.51 cm/m  RA Area:     18.00 cm LA Vol (A2C):   66.6 ml 23.36 ml/m RA Volume:   49.80 ml  17.47 ml/m LA Vol (A4C):   68.4 ml 23.99 ml/m LA Biplane Vol: 69.2 ml 24.27 ml/m  AORTIC VALVE AV Area (Vmax):    3.06 cm AV Area (Vmean):   2.70 cm AV Area (VTI):     2.57 cm AV Vmax:           86.87 cm/s AV Vmean:          65.721 cm/s AV VTI:            0.197 m AV Peak Grad:      3.0 mmHg AV Mean Grad:      1.9 mmHg LVOT Vmax:         76.69 cm/s LVOT Vmean:        51.172  cm/s LVOT VTI:          0.146 m LVOT/AV VTI ratio: 0.74  AORTA Ao Root diam: 3.50 cm MITRAL VALVE                TRICUSPID VALVE MV Area (PHT): 4.44 cm     TR Peak grad:   22.7 mmHg MV Decel Time: 171 msec     TR Vmax:        238.00 cm/s MV E velocity: 136.00 cm/s MV A velocity: 39.80 cm/s   SHUNTS MV E/A ratio:  3.42         Systemic VTI:  0.15 m  Systemic Diam: 2.10 cm Carlyle Dolly MD Electronically signed by Carlyle Dolly MD Signature Date/Time: 06/11/2020/1:42:22 PM    Final     Cardiac Studies     Patient Profile     Angie Fava a 70 y.o.malewith past medical history of HTN, Type 2 DM, OSA (not on CPAP)and remote history of atrial fibrillation (occurring in 07/2011 with conversion back to NSRwith IV Amiodarone and IV Cardizem)who is being seen today for the evaluation of atrial fibrillation with RVRat the request of Dr. Renton Shelter and Dr. Roderic Palau.  Assessment & Plan    1. Aflutter with RVR - history of prior paroxysmal afib and aflutter as documented in 2013 cardiology notes. Had been on coumadin at that time. No f/u in several years, was off anticoag at admission - presented with aflutter with RVR - started on dilt gtt initially. Oral dilt started yesterday 60mg  tid. bp's are tolerating.  - started on eliquis 5mg  bid.   - COVID +, would not be eligible for TEE/DCCV except in urgent indication - would look to rate control, consider DCCV in 3 weeks on anticoag.   - some of the tele reported rates are inaccurate as flutter waves are being counted at times. STill on dilt drip at 10, increase oral dilt to 90mg  q6 hours, wean dilt gtt further.    2. SOB  - signs of fluid overload on admission - received IV lasix 40mg  x 1, neg 545mL. Mild uptrend in Cr.  - echo LVEF 55-60%, indet dd, normal RV - did test COVID + on admission  3. COVID + - subsequent COVID testing came back positive.  - management per primary team  For questions or  updates, please contact [place] Please consult [email or web address] for contact info under        Signed, Carlyle Dolly, MD  06/12/2020, 7:48 AM

## 2020-06-12 NOTE — Progress Notes (Addendum)
PROGRESS NOTE  Kevin Goodman TML:[id number] DOB: 1951/02/15 DOA: 06/10/2020 PCP: Lucia Gaskins, MD   Brief History:  70 year old male with a history of diabetes mellitus type 2, dysphagia, hypertension, remote atrial fibrillation, and morbid obesity presenting with elevated heart rate.  The patient was at his gastroenterology office when he was noted to have heart rate >140.  He was directed to the emergency department for further evaluation.  In the emergency department, EKG showed atrial flutter with heart rate in the 140s.  The patient states that he has been having palpitations with increase in all exertional dyspnea for the past 3 to 4 days.  In addition, the patient has noted an increasing cough with yellow sputum.  The patient himself denied any chest pain, fevers, chills, headache, nausea, vomiting, direct abdominal pain.  He has noted some increasing lower extremity edema over the past several weeks.  In the emergency department, the patient was given metoprolol IV and diltiazem IV.  He remained in atrial flutter.  Cardiology was consulted to assist with management.  Notably, the patient was noted to have a positive Covid RT-PCR.  Assessment/Plan: Atrial flutter with RVR -Currently on diltiazem drip -HR continued to be in 120-130 -increased po diltiazem to 90 q 6hrs -TSH 4.010 -Echo--EF 55-60, no WMA -CHADS-VASc = 3 -Continue apixaban -appreciate cardiology consult--continue metoprolol 25 q 8 with po dilt  COVID-19 pneumonia -Personally reviewed chest x-ray--increased interstitial markings -The patient was incidentally positive -He has been vaccinated x3 -continue remdesivir D#2 -CRP 0.6>>0.5 -Ferritin33>>39 -D-dimer 0.27>> -Currently stable on room air -Vitamin C and zinc  Essential hypertension -Discontinue amlodipine -Continue diltiazem and metoprolol  Uncontrolled diabetes mellitus type 2 with hyperglycemia -04/21/2016 hemoglobin A1c 14.1 -06/11/20  A1c--7.4 -NovoLog sliding scale -Continue Lantus  Morbid obesity -BMI 51.16 -Lifestyle modification  Gouty arthritis -Continue allopurinol      Status is: Inpatient  Remains inpatient appropriate because:IV treatments appropriate due to intensity of illness or inability to take PO   Dispo: The patient is from: Home  Anticipated d/c is to: Home  Patient currently is not medically stable to d/c.              Difficult to place patient No        Family Communication:   No Family at bedside  Consultants:  cardiology  Code Status:  FULL   DVT Prophylaxis:  apixaban   Procedures: As Listed in Progress Note Above  Antibiotics: None    Subjective: Patient denies fevers, chills, headache, chest pain, dyspnea, nausea, vomiting, diarrhea, abdominal pain, dysuria, hematuria, hematochezia, and melena.   Objective: Vitals:   06/12/20 0700 06/12/20 0800 06/12/20 0900 06/12/20 1000  BP: 134/83 (!) 123/49 (!) 167/81   Pulse: (!) 49 74 (!) 146 83  Resp: 17 20 (!) 22 18  Temp:      TempSrc:      SpO2: 98% 94% 98% 98%  Weight:      Height:        Intake/Output Summary (Last 24 hours) at 06/12/2020 1638 Last data filed at 06/12/2020 1017 Gross per 24 hour  Intake 177.5 ml  Output 725 ml  Net -547.5 ml   Weight change: -2.364 kg Exam:   General:  Pt is alert, follows commands appropriately, not in acute distress  HEENT: No icterus, No thrush, No neck mass, [place]/AT  Cardiovascular: IRRR, S1/S2, no rubs, no gallops  Respiratory: bibasilar rales. No wheeze  Abdomen: Soft/+BS,  non tender, non distended, no guarding  Extremities: trace LE edema, No lymphangitis, No petechiae, No rashes, no synovitis   Data Reviewed: I have personally reviewed following labs and imaging studies Basic Metabolic Panel: Recent Labs  Lab 06/10/20 0945 06/11/20 0650 06/12/20 0638  NA 141 138  137 139  K 4.7 4.7  4.7 4.3  CL  108 103  101 102  CO2 24 24  24 27   GLUCOSE 153* 168*  167* 130*  BUN 20 28*  27* 25*  CREATININE 1.31* 1.42*  1.39* 1.40*  CALCIUM 9.4 9.4  9.3 9.4  MG  --  2.0  --    Liver Function Tests: Recent Labs  Lab 06/10/20 0945 06/11/20 0650 06/12/20 0638  AST 33 32 31  ALT 64* 64* 59*  ALKPHOS 62 51 50  BILITOT 0.9 1.0 0.8  PROT 7.2 6.8 6.5  ALBUMIN 4.0 3.9 3.8   No results for input(s): LIPASE, AMYLASE in the last 168 hours. No results for input(s): AMMONIA in the last 168 hours. Coagulation Profile: Recent Labs  Lab 06/11/20 0650  INR 1.5*   CBC: Recent Labs  Lab 06/10/20 0945 06/11/20 0650 06/12/20 0638  WBC 10.3 13.8* 12.3*  NEUTROABS 5.4 9.9* 6.2  HGB 13.4 13.4 13.7  HCT 42.3 41.3 43.8  MCV 95.9 96.3 95.2  PLT 313 290 323   Cardiac Enzymes: No results for input(s): CKTOTAL, CKMB, CKMBINDEX, TROPONINI in the last 168 hours. BNP: Invalid input(s): POCBNP CBG: Recent Labs  Lab 06/10/20 1419 06/11/20 0006  GLUCAP 154* 159*   HbA1C: Recent Labs    06/11/20 0650  HGBA1C 7.4*   Urine analysis:    Component Value Date/Time   COLORURINE YELLOW 07/16/2011 [place] 07/16/2011 1407   LABSPEC 1.010 07/16/2011 1407   PHURINE 6.0 07/16/2011 1407   GLUCOSEU NEGATIVE 07/16/2011 1407   HGBUR TRACE (A) 07/16/2011 1407   BILIRUBINUR NEGATIVE 07/16/2011 1407   KETONESUR NEGATIVE 07/16/2011 1407   PROTEINUR NEGATIVE 07/16/2011 1407   UROBILINOGEN 0.2 07/16/2011 1407   NITRITE NEGATIVE 07/16/2011 1407   LEUKOCYTESUR SMALL (A) 07/16/2011 1407   Sepsis Labs: @LABRCNTIP (procalcitonin:4,lacticidven:4) ) Recent Results (from the past 240 hour(s))  Resp Panel by RT-PCR (Flu A&B, Covid) Nasopharyngeal Swab     Status: Abnormal   Collection Time: 06/10/20  1:27 PM   Specimen: Nasopharyngeal Swab; Nasopharyngeal(NP) swabs in vial transport medium  Result Value Ref Range Status   SARS Coronavirus 2 by RT PCR POSITIVE (A) NEGATIVE Final     Comment: RESULT CALLED TO, READ BACK BY AND VERIFIED WITH: MURPHY,E RN @1901  06/10/20 BILLINGSLEY,L (NOTE) SARS-CoV-2 target nucleic acids are DETECTED.  The SARS-CoV-2 RNA is generally detectable in upper respiratory specimens during the acute phase of infection. Positive results are indicative of the presence of the identified virus, but do not rule out bacterial infection or co-infection with other pathogens not detected by the test. Clinical correlation with patient history and other diagnostic information is necessary to determine patient infection status. The expected result is Negative.  Fact Sheet for Patients: EntrepreneurPulse.com.au  Fact Sheet for Healthcare Providers: IncredibleEmployment.be  This test is not yet approved or cleared by the [place] FDA and  has been authorized for detection and/or diagnosis of SARS-CoV-2 by FDA under an Emergency Use Authorization (EUA).  This EUA will remain in effect (meaning this test c an be used) for the duration of  the COVID-19 declaration under Section 564(b)(1) of the Act, 21 U.S.C. section 360bbb-3(b)(1),  unless the authorization is terminated or revoked sooner.     Influenza A by PCR NEGATIVE NEGATIVE Final   Influenza B by PCR NEGATIVE NEGATIVE Final    Comment: (NOTE) The Xpert Xpress SARS-CoV-2/FLU/RSV plus assay is intended as an aid in the diagnosis of influenza from Nasopharyngeal swab specimens and should not be used as a sole basis for treatment. Nasal washings and aspirates are unacceptable for Xpert Xpress SARS-CoV-2/FLU/RSV testing.  Fact Sheet for Patients: EntrepreneurPulse.com.au  Fact Sheet for Healthcare Providers: IncredibleEmployment.be  This test is not yet approved or cleared by the [place] FDA and has been authorized for detection and/or diagnosis of SARS-CoV-2 by FDA under an Emergency Use Authorization (EUA). This  EUA will remain in effect (meaning this test can be used) for the duration of the COVID-19 declaration under Section 564(b)(1) of the Act, 21 U.S.C. section 360bbb-3(b)(1), unless the authorization is terminated or revoked.  Performed at [facility], [address], [place], [place] [address]   MRSA PCR Screening     Status: None   Collection Time: 06/10/20  2:50 PM   Specimen: Nasal Mucosa; Nasopharyngeal  Result Value Ref Range Status   MRSA by PCR NEGATIVE NEGATIVE Final    Comment:        The GeneXpert MRSA Assay (FDA approved for NASAL specimens only), is one component of a comprehensive MRSA colonization surveillance program. It is not intended to diagnose MRSA infection nor to guide or monitor treatment for MRSA infections. Performed at [facility], [address], [place], [place] [address]      Scheduled Meds: . allopurinol  300 mg Oral Daily  . apixaban  5 mg Oral BID  . vitamin C  500 mg Oral Daily  . Chlorhexidine Gluconate Cloth  6 each Topical Q0600  . diltiazem  90 mg Oral QID  . insulin glargine  30 Units Subcutaneous Q2200  . metoprolol tartrate  25 mg Oral TID  . multivitamin with minerals  1 tablet Oral Daily  . pantoprazole  40 mg Oral Daily  . sodium chloride flush  3 mL Intravenous Q12H  . sodium chloride flush  3 mL Intravenous Q12H  . zinc sulfate  220 mg Oral Daily   Continuous Infusions: . sodium chloride    . diltiazem (CARDIZEM) infusion 15 mg/hr (06/12/20 1017)  . remdesivir 100 mg in NS 100 mL 100 mg (06/12/20 1126)    Procedures/Studies: DG Chest Port 1 View  Result Date: 06/10/2020 CLINICAL DATA:  Chest pain, shortness of breath, weakness EXAM: PORTABLE CHEST 1 VIEW COMPARISON:  01/07/2017 FINDINGS: The heart size and mediastinal contours are within normal limits. Both lungs are clear. The visualized skeletal structures are unremarkable. IMPRESSION: No active disease. Electronically Signed   By: Miachel Roux M.D.   On: 06/10/2020 10:36    ECHOCARDIOGRAM COMPLETE  Result Date: 06/11/2020    ECHOCARDIOGRAM REPORT   Patient Name:   Kevin Goodman Date of Exam: 06/11/2020 Medical Rec #:  [id number]        Height:       73.0 in Accession #:    [id number]       Weight:       387.8 lb Date of Birth:  18-Apr-1950         BSA:          2.851 m Patient Age:    23 years         BP:  184/98 mmHg Patient Gender: M                HR:           94 bpm. Exam Location:  [facility] Procedure: 2D Echo Indications:    Atrial Fibrillation I48.91  History:        Patient has prior history of Echocardiogram examinations, most                 recent 07/19/2011. Arrythmias:Atrial Flutter and Atrial                 Fibrillation; Risk Factors:Former Smoker, Hypertension and                 Diabetes. Pneumonia.  Sonographer:    Leavy Cella RDCS (AE) Referring Phys: [phone] Millisa Giarrusso IMPRESSIONS  1. Left ventricular ejection fraction, by estimation, is 55 to 60%. The left ventricle has normal function. The left ventricle has no regional wall motion abnormalities. There is moderate left ventricular hypertrophy. Left ventricular diastolic parameters are indeterminate.  2. Right ventricular systolic function is normal. The right ventricular size is normal.  3. The mitral valve is normal in structure. No evidence of mitral valve regurgitation. No evidence of mitral stenosis.  4. The aortic valve is tricuspid. There is mild calcification of the aortic valve. There is mild thickening of the aortic valve. Aortic valve regurgitation is not visualized. No aortic stenosis is present.  5. Mild pulmonary HTN, PASP is 31 mmHg.  6. The inferior vena cava is dilated in size with >50% respiratory variability, suggesting right atrial pressure of 8 mmHg. FINDINGS  Left Ventricle: Left ventricular ejection fraction, by estimation, is 55 to 60%. The left ventricle has normal function. The left ventricle has no regional wall motion abnormalities. The left ventricular internal cavity size  was normal in size. There is  moderate left ventricular hypertrophy. Left ventricular diastolic parameters are indeterminate. Right Ventricle: The right ventricular size is normal. No increase in right ventricular wall thickness. Right ventricular systolic function is normal. Left Atrium: Left atrial size was normal in size. Right Atrium: Right atrial size was normal in size. Pericardium: There is no evidence of pericardial effusion. Mitral Valve: The mitral valve is normal in structure. No evidence of mitral valve regurgitation. No evidence of mitral valve stenosis. Tricuspid Valve: The tricuspid valve is normal in structure. Tricuspid valve regurgitation is mild . No evidence of tricuspid stenosis. Aortic Valve: The aortic valve is tricuspid. There is mild calcification of the aortic valve. There is mild thickening of the aortic valve. There is mild aortic valve annular calcification. Aortic valve regurgitation is not visualized. No aortic stenosis  is present. Aortic valve mean gradient measures 1.9 mmHg. Aortic valve peak gradient measures 3.0 mmHg. Aortic valve area, by VTI measures 2.57 cm. Pulmonic Valve: The pulmonic valve was not well visualized. Pulmonic valve regurgitation is not visualized. No evidence of pulmonic stenosis. Aorta: The aortic root is normal in size and structure. Pulmonary Artery: Mild pulmonary HTN, PASP is 31 mmHg. Venous: The inferior vena cava is dilated in size with greater than 50% respiratory variability, suggesting right atrial pressure of 8 mmHg. IAS/Shunts: The interatrial septum was not well visualized.  LEFT VENTRICLE PLAX 2D LVIDd:         4.09 cm  Diastology LVIDs:         2.81 cm  LV e' medial:    9.46 cm/s LV PW:  1.40 cm  LV E/e' medial:  14.4 LV IVS:        1.42 cm  LV e' lateral:   14.70 cm/s LVOT diam:     2.10 cm  LV E/e' lateral: 9.3 LV SV:         51 LV SV Index:   18 LVOT Area:     3.46 cm  RIGHT VENTRICLE RV S prime:     8.45 cm/s TAPSE (M-mode): 2.1 cm  LEFT ATRIUM             Index       RIGHT ATRIUM           Index LA diam:        4.30 cm 1.51 cm/m  RA Area:     18.00 cm LA Vol (A2C):   66.6 ml 23.36 ml/m RA Volume:   49.80 ml  17.47 ml/m LA Vol (A4C):   68.4 ml 23.99 ml/m LA Biplane Vol: 69.2 ml 24.27 ml/m  AORTIC VALVE AV Area (Vmax):    3.06 cm AV Area (Vmean):   2.70 cm AV Area (VTI):     2.57 cm AV Vmax:           86.87 cm/s AV Vmean:          65.721 cm/s AV VTI:            0.197 m AV Peak Grad:      3.0 mmHg AV Mean Grad:      1.9 mmHg LVOT Vmax:         76.69 cm/s LVOT Vmean:        51.172 cm/s LVOT VTI:          0.146 m LVOT/AV VTI ratio: 0.74  AORTA Ao Root diam: 3.50 cm MITRAL VALVE                TRICUSPID VALVE MV Area (PHT): 4.44 cm     TR Peak grad:   22.7 mmHg MV Decel Time: 171 msec     TR Vmax:        238.00 cm/s MV E velocity: 136.00 cm/s MV A velocity: 39.80 cm/s   SHUNTS MV E/A ratio:  3.42         Systemic VTI:  0.15 m                             Systemic Diam: 2.10 cm Carlyle Dolly MD Electronically signed by Carlyle Dolly MD Signature Date/Time: 06/11/2020/1:42:22 PM    Final     Orson Eva, DO  [facility]  If 7PM-7AM, please contact night-coverage [email or web address] Password [facility] 06/12/2020, 4:38 PM   LOS: 2 days

## 2020-06-13 DIAGNOSIS — I4892 Unspecified atrial flutter: Secondary | ICD-10-CM | POA: Diagnosis not present

## 2020-06-13 DIAGNOSIS — J1282 Pneumonia due to coronavirus disease 2019: Secondary | ICD-10-CM | POA: Diagnosis not present

## 2020-06-13 DIAGNOSIS — U071 COVID-19: Secondary | ICD-10-CM | POA: Diagnosis not present

## 2020-06-13 LAB — CBC WITH DIFFERENTIAL/PLATELET
Abs Immature Granulocytes: 0.05 10*3/uL (ref 0.00–0.07)
Basophils Absolute: 0.1 10*3/uL (ref 0.0–0.1)
Basophils Relative: 1 %
Eosinophils Absolute: 0.2 10*3/uL (ref 0.0–0.5)
Eosinophils Relative: 1 %
HCT: 43.4 % (ref 39.0–52.0)
Hemoglobin: 13.8 g/dL (ref 13.0–17.0)
Immature Granulocytes: 1 %
Lymphocytes Relative: 39 %
Lymphs Abs: 4.4 10*3/uL — ABNORMAL HIGH (ref 0.7–4.0)
MCH: 30.1 pg (ref 26.0–34.0)
MCHC: 31.8 g/dL (ref 30.0–36.0)
MCV: 94.8 fL (ref 80.0–100.0)
Monocytes Absolute: 0.8 10*3/uL (ref 0.1–1.0)
Monocytes Relative: 8 %
Neutro Abs: 5.6 10*3/uL (ref 1.7–7.7)
Neutrophils Relative %: 50 %
Platelets: 294 10*3/uL (ref 150–400)
RBC: 4.58 MIL/uL (ref 4.22–5.81)
RDW: 15.2 % (ref 11.5–15.5)
WBC: 11.1 10*3/uL — ABNORMAL HIGH (ref 4.0–10.5)
nRBC: 0 % (ref 0.0–0.2)

## 2020-06-13 LAB — COMPREHENSIVE METABOLIC PANEL
ALT: 50 U/L — ABNORMAL HIGH (ref 0–44)
AST: 20 U/L (ref 15–41)
Albumin: 3.7 g/dL (ref 3.5–5.0)
Alkaline Phosphatase: 51 U/L (ref 38–126)
Anion gap: 11 (ref 5–15)
BUN: 21 mg/dL (ref 8–23)
CO2: 25 mmol/L (ref 22–32)
Calcium: 9.4 mg/dL (ref 8.9–10.3)
Chloride: 104 mmol/L (ref 98–111)
Creatinine, Ser: 1.34 mg/dL — ABNORMAL HIGH (ref 0.61–1.24)
GFR, Estimated: 58 mL/min — ABNORMAL LOW (ref >60)
Glucose, Bld: 112 mg/dL — ABNORMAL HIGH (ref 70–99)
Potassium: 4 mmol/L (ref 3.5–5.1)
Sodium: 140 mmol/L (ref 135–145)
Total Bilirubin: 0.9 mg/dL (ref 0.3–1.2)
Total Protein: 6.3 g/dL — ABNORMAL LOW (ref 6.5–8.1)

## 2020-06-13 LAB — C-REACTIVE PROTEIN: CRP: 0.6 mg/dL (ref <1.0)

## 2020-06-13 LAB — GLUCOSE, CAPILLARY
Glucose-Capillary: 148 mg/dL — ABNORMAL HIGH (ref 70–99)
Glucose-Capillary: 160 mg/dL — ABNORMAL HIGH (ref 70–99)

## 2020-06-13 LAB — D-DIMER, QUANTITATIVE: D-Dimer, Quant: 0.27 ug/mL-FEU (ref 0.00–0.50)

## 2020-06-13 LAB — FERRITIN: Ferritin: 36 ng/mL (ref 24–336)

## 2020-06-13 MED ORDER — DILTIAZEM HCL ER COATED BEADS 180 MG PO CP24
360.0000 mg | ORAL_CAPSULE | Freq: Every day | ORAL | Status: DC
  Administered 2020-06-13 – 2020-06-14 (×2): 360 mg via ORAL
  Filled 2020-06-13 (×2): qty 2

## 2020-06-13 MED ORDER — METOPROLOL TARTRATE 50 MG PO TABS
50.0000 mg | ORAL_TABLET | Freq: Three times a day (TID) | ORAL | Status: DC
  Administered 2020-06-13 – 2020-06-14 (×4): 50 mg via ORAL
  Filled 2020-06-13 (×2): qty 2
  Filled 2020-06-13 (×2): qty 1

## 2020-06-13 NOTE — Progress Notes (Signed)
Progress Note  Patient Name: Kevin Goodman Date of Encounter: 06/13/2020   [facility] Cardiologist: New, Dr Harl Bowie  Subjective   No complaints  Inpatient Medications    Scheduled Meds: . allopurinol  300 mg Oral Daily  . apixaban  5 mg Oral BID  . vitamin C  500 mg Oral Daily  . Chlorhexidine Gluconate Cloth  6 each Topical Q0600  . diltiazem  90 mg Oral QID  . guaiFENesin  1,200 mg Oral BID  . insulin glargine  30 Units Subcutaneous Q2200  . metoprolol tartrate  25 mg Oral TID  . multivitamin with minerals  1 tablet Oral Daily  . pantoprazole  40 mg Oral Daily  . sodium chloride flush  3 mL Intravenous Q12H  . sodium chloride flush  3 mL Intravenous Q12H  . zinc sulfate  220 mg Oral Daily   Continuous Infusions: . sodium chloride    . diltiazem (CARDIZEM) infusion 10 mg/hr (06/13/20 0700)  . remdesivir 100 mg in NS 100 mL 100 mg (06/12/20 1126)   PRN Meds: sodium chloride, acetaminophen **OR** acetaminophen, albuterol, alum & mag hydroxide-simeth, bisacodyl, guaiFENesin-dextromethorphan, hydrALAZINE, HYDROmorphone (DILAUDID) injection, ipratropium, levalbuterol, magnesium citrate, ondansetron **OR** ondansetron (ZOFRAN) IV, oxyCODONE, senna-docusate, sodium chloride, sodium chloride flush, traZODone   Vital Signs    Vitals:   06/13/20 0500 06/13/20 0600 06/13/20 0700 06/13/20 0750  BP: 126/65 (!) 163/75 (!) 151/59   Pulse:      Resp: (!) 21 14 12    Temp:    97.8 F (36.6 C)  TempSrc:    Oral  SpO2:      Weight:    (!) 170.6 kg  Height:        Intake/Output Summary (Last 24 hours) at 06/13/2020 0811 Last data filed at 06/13/2020 0742 Gross per 24 hour  Intake 420.43 ml  Output 1600 ml  Net -1179.57 ml   Last 3 Weights 06/13/2020 06/12/2020 06/11/2020  Weight (lbs) 376 lb 387 lb 12.6 oz 387 lb 12.6 oz  Weight (kg) 170.552 kg 175.9 kg 175.9 kg      Telemetry    Aflutter variable rates - Personally Reviewed  ECG    n/a - Personally Reviewed  Physical  Exam   GEN: No acute distress.   Neck: No JVD Cardiac: RRR, no murmurs, rubs, or gallops.  Respiratory: Clear to auscultation bilaterally. GI: Soft, nontender, non-distended  MS: No edema; No deformity. Neuro:  Nonfocal  Psych: Normal affect   Labs    High Sensitivity Troponin:   Recent Labs  Lab 06/10/20 0945 06/10/20 1149  TROPONINIHS 8 8      Chemistry Recent Labs  Lab 06/11/20 0650 06/12/20 0638 06/13/20 0446  NA 138  137 139 140  K 4.7  4.7 4.3 4.0  CL 103  101 102 104  CO2 24  24 27 25   GLUCOSE 168*  167* 130* 112*  BUN 28*  27* 25* 21  CREATININE 1.42*  1.39* 1.40* 1.34*  CALCIUM 9.4  9.3 9.4 9.4  PROT 6.8 6.5 6.3*  ALBUMIN 3.9 3.8 3.7  AST 32 31 20  ALT 64* 59* 50*  ALKPHOS 51 50 51  BILITOT 1.0 0.8 0.9  GFRNONAA 54*  56* 55* 58*  ANIONGAP 11  12 10 11      Hematology Recent Labs  Lab 06/11/20 0650 06/12/20 0638 06/13/20 0446  WBC 13.8* 12.3* 11.1*  RBC 4.29 4.60 4.58  HGB 13.4 13.7 13.8  HCT 41.3 43.8 43.4  MCV  96.3 95.2 94.8  MCH 31.2 29.8 30.1  MCHC 32.4 31.3 31.8  RDW 15.9* 15.6* 15.2  PLT 290 323 294    BNP Recent Labs  Lab 06/10/20 0945  BNP 410.0*     DDimer  Recent Labs  Lab 06/11/20 0650 06/12/20 1634 06/13/20 0446  DDIMER 0.27 0.30 0.27     Radiology    ECHOCARDIOGRAM COMPLETE  Result Date: 06/11/2020    ECHOCARDIOGRAM REPORT   Patient Name:   Kevin Goodman Date of Exam: 06/11/2020 Medical Rec #:  [id number]        Height:       73.0 in Accession #:    [id number]       Weight:       387.8 lb Date of Birth:  [birth year]         BSA:          2.851 m Patient Age:    70 years         BP:           184/98 mmHg Patient Gender: M                HR:           94 bpm. Exam Location:  [facility] Procedure: 2D Echo Indications:    Atrial Fibrillation I48.91  History:        Patient has prior history of Echocardiogram examinations, most                 recent 07/19/2011. Arrythmias:Atrial Flutter and Atrial                  Fibrillation; Risk Factors:Former Smoker, Hypertension and                 Diabetes. Pneumonia.  Sonographer:    Leavy Cella RDCS (AE) Referring Phys: [phone] DAVID TAT IMPRESSIONS  1. Left ventricular ejection fraction, by estimation, is 55 to 60%. The left ventricle has normal function. The left ventricle has no regional wall motion abnormalities. There is moderate left ventricular hypertrophy. Left ventricular diastolic parameters are indeterminate.  2. Right ventricular systolic function is normal. The right ventricular size is normal.  3. The mitral valve is normal in structure. No evidence of mitral valve regurgitation. No evidence of mitral stenosis.  4. The aortic valve is tricuspid. There is mild calcification of the aortic valve. There is mild thickening of the aortic valve. Aortic valve regurgitation is not visualized. No aortic stenosis is present.  5. Mild pulmonary HTN, PASP is 31 mmHg.  6. The inferior vena cava is dilated in size with >50% respiratory variability, suggesting right atrial pressure of 8 mmHg. FINDINGS  Left Ventricle: Left ventricular ejection fraction, by estimation, is 55 to 60%. The left ventricle has normal function. The left ventricle has no regional wall motion abnormalities. The left ventricular internal cavity size was normal in size. There is  moderate left ventricular hypertrophy. Left ventricular diastolic parameters are indeterminate. Right Ventricle: The right ventricular size is normal. No increase in right ventricular wall thickness. Right ventricular systolic function is normal. Left Atrium: Left atrial size was normal in size. Right Atrium: Right atrial size was normal in size. Pericardium: There is no evidence of pericardial effusion. Mitral Valve: The mitral valve is normal in structure. No evidence of mitral valve regurgitation. No evidence of mitral valve stenosis. Tricuspid Valve: The tricuspid valve is normal in structure. Tricuspid valve regurgitation is mild .  No evidence  of tricuspid stenosis. Aortic Valve: The aortic valve is tricuspid. There is mild calcification of the aortic valve. There is mild thickening of the aortic valve. There is mild aortic valve annular calcification. Aortic valve regurgitation is not visualized. No aortic stenosis  is present. Aortic valve mean gradient measures 1.9 mmHg. Aortic valve peak gradient measures 3.0 mmHg. Aortic valve area, by VTI measures 2.57 cm. Pulmonic Valve: The pulmonic valve was not well visualized. Pulmonic valve regurgitation is not visualized. No evidence of pulmonic stenosis. Aorta: The aortic root is normal in size and structure. Pulmonary Artery: Mild pulmonary HTN, PASP is 31 mmHg. Venous: The inferior vena cava is dilated in size with greater than 50% respiratory variability, suggesting right atrial pressure of 8 mmHg. IAS/Shunts: The interatrial septum was not well visualized.  LEFT VENTRICLE PLAX 2D LVIDd:         4.09 cm  Diastology LVIDs:         2.81 cm  LV e' medial:    9.46 cm/s LV PW:         1.40 cm  LV E/e' medial:  14.4 LV IVS:        1.42 cm  LV e' lateral:   14.70 cm/s LVOT diam:     2.10 cm  LV E/e' lateral: 9.3 LV SV:         51 LV SV Index:   18 LVOT Area:     3.46 cm  RIGHT VENTRICLE RV S prime:     8.45 cm/s TAPSE (M-mode): 2.1 cm LEFT ATRIUM             Index       RIGHT ATRIUM           Index LA diam:        4.30 cm 1.51 cm/m  RA Area:     18.00 cm LA Vol (A2C):   66.6 ml 23.36 ml/m RA Volume:   49.80 ml  17.47 ml/m LA Vol (A4C):   68.4 ml 23.99 ml/m LA Biplane Vol: 69.2 ml 24.27 ml/m  AORTIC VALVE AV Area (Vmax):    3.06 cm AV Area (Vmean):   2.70 cm AV Area (VTI):     2.57 cm AV Vmax:           86.87 cm/s AV Vmean:          65.721 cm/s AV VTI:            0.197 m AV Peak Grad:      3.0 mmHg AV Mean Grad:      1.9 mmHg LVOT Vmax:         76.69 cm/s LVOT Vmean:        51.172 cm/s LVOT VTI:          0.146 m LVOT/AV VTI ratio: 0.74  AORTA Ao Root diam: 3.50 cm MITRAL VALVE                 TRICUSPID VALVE MV Area (PHT): 4.44 cm     TR Peak grad:   22.7 mmHg MV Decel Time: 171 msec     TR Vmax:        238.00 cm/s MV E velocity: 136.00 cm/s MV A velocity: 39.80 cm/s   SHUNTS MV E/A ratio:  3.42         Systemic VTI:  0.15 m  Systemic Diam: 2.10 cm Carlyle Dolly MD Electronically signed by Carlyle Dolly MD Signature Date/Time: 06/11/2020/1:42:22 PM    Final     Cardiac Studies     Patient Profile     Angie Fava a 70 y.o.malewith past medical history of HTN, Type 2 DM, OSA (not on CPAP)and remote history of atrial fibrillation (occurring in 07/2011 with conversion back to NSRwith IV Amiodarone and IV Cardizem)who is being seen today for the evaluation of atrial fibrillation with RVRat the request of Dr. Jervon Shelter and Dr. Roderic Palau.  Assessment & Plan    1. Aflutter with RVR - history of prior paroxysmal afib and aflutter as documented in 2013 cardiology notes. Had been on coumadin at that time. No f/u in several years, was off anticoag at admission - presented with aflutter with RVR - started on dilt gtt initially. Started on oral dilt increased to 90mg  every 6 ours, also added lopressor 25mg  tid yesterday. BP's are tolerating. Remains on dilt gtt at 10. Change oral dilt to long acting 360mg  daily, increase lopressor to 50mg  tid.  - started on eliquis 5mg  bid.   - COVID +, would not be eligible for TEE/DCCV except in urgent indication - would look to rate control, consider DCCV in 3 weeks on anticoag.   -tele rates often inaccurate due to inappropriate counting of flutter waves. Several strips reviwed reporting heart rates of 140 however true rates were in the 70s.   -if rates <110 would be reasonable to d/c over the weekend given he is not significantly symptomatic. LIkely will need cardioversion as outpatient in the near future. COuld titrate lopressor further if needed. If severe refractory tachy over the weekend could start amio with  plans for overall short course.    2. SOB  - signs of fluid overload on admission - received IV lasix 40mg  x 1, neg 540mL. Mild uptrend in Cr.  - echo LVEF 55-60%, indet dd, normal RV - did test COVID + on admission  3. COVID + - subsequent COVID testing came back positive.  - management per primary team  For questions or updates, please contact [place] Please consult [email or web address] for contact info under        Signed, Carlyle Dolly, MD  06/13/2020, 8:11 AM

## 2020-06-13 NOTE — Progress Notes (Signed)
PROGRESS NOTE  Kevin Goodman OIT:[id number] DOB: 1950/04/14 DOA: 06/10/2020 PCP: Lucia Gaskins, MD  Brief History: 70 year old male with a history of diabetes mellitus type 2, dysphagia, hypertension, remote atrial fibrillation, and morbid obesity presenting with elevated heart rate. The patient was at his gastroenterology office when he was noted to have heart rate >140.He was directed to the emergency department for further evaluation. In the emergency department, EKG showed atrial flutter with heart rate in the 140s. The patient states that he has been having palpitations with increase in all exertional dyspnea for the past 3 to 4 days. In addition, the patient has noted an increasing cough with yellow sputum. The patient himself denied any chest pain, fevers, chills, headache, nausea, vomiting, direct abdominal pain. He has noted some increasing lower extremity edema over the past several weeks. In the emergency department, the patient was given metoprolol IV and diltiazem IV. He remained in atrial flutter. Cardiology was consulted to assist with management. Notably, the patient was noted to have a positive Covid RT-PCR.  Assessment/Plan: Atrial flutter with RVR -Currently on diltiazem drip -HR continued to be in 120-130  -increased po diltiazem to 90 q 6hrs>>cardizem CD 360 mg -TSH 4.010 -Echo--EF 55-60, no WMA -CHADS-VASc = 3 -Continue apixaban -appreciate cardiology consult--increase metoprolol 50 q 8 with po dilt -aim for HR <110  COVID-19 pneumonia -Personally reviewed chest x-ray--increased interstitial markings -The patient was incidentally positive -He has been vaccinated x3 -continue remdesivir D#2 -CRP 0.6>>0.5>>0.6 -Ferritin33>>39>>36 -D-dimer 0.27>>0.30>>0.27 -Currently stable on room air -Vitamin C and zinc -received 3 day remdesivir  Essential hypertension -Discontinue amlodipine -Continuediltiazem and metoprolol  Uncontrolled  diabetes mellitus type 2 with hyperglycemia -04/21/2016 hemoglobin A1c 14.1 -06/11/20 A1c--7.4 -NovoLog sliding scale -Continue Lantus  Morbid obesity -BMI 51.16 -Lifestyle modification  Gouty arthritis -Continue allopurinol      Status is: Inpatient  Remains inpatient appropriate because:IV treatments appropriate due to intensity of illness or inability to take PO   Dispo: The patient is from:Home Anticipated d/c is RA:XENM Patient currently is not medically stable to d/c. Difficult to place patient No        Family Communication:NoFamily at bedside  Consultants:cardiology  Code Status: FULL   DVT Prophylaxis:apixaban   Procedures: As Listed in Progress Note Above  Antibiotics: None    Subjective: Patient has dysphagia and posttussive emesis occasionally.  Denies f/c, cp, sob, dizziness, abd pain, diarrhea.  Objective: Vitals:   06/13/20 1135 06/13/20 1200 06/13/20 1400 06/13/20 1700  BP: (!) 148/88 (!) 166/106 (!) 170/96   Pulse:  71 71   Resp:  19 (!) 21   Temp:    98.1 F (36.7 C)  TempSrc:    Oral  SpO2:  96% 98%   Weight:      Height:        Intake/Output Summary (Last 24 hours) at 06/13/2020 1719 Last data filed at 06/13/2020 1441 Gross per 24 hour  Intake 242.93 ml  Output 2200 ml  Net -1957.07 ml   Weight change:  Exam:   General:  Pt is alert, follows commands appropriately, not in acute distress  HEENT: No icterus, No thrush, No neck mass, [place]/AT  Cardiovascular: IRRR, S1/S2, no rubs, no gallops  Respiratory: CTA bilaterally, no wheezing, no crackles, no rhonchi  Abdomen: Soft/+BS, non tender, non distended, no guarding  Extremities: trace LE edema, No lymphangitis, No petechiae, No rashes, no synovitis   Data Reviewed: I have personally reviewed  following labs and imaging studies Basic Metabolic Panel: Recent Labs  Lab 06/10/20 0945  06/11/20 0650 06/12/20 0638 06/13/20 0446  NA 141 138  137 139 140  K 4.7 4.7  4.7 4.3 4.0  CL 108 103  101 102 104  CO2 24 24  24 27 25   GLUCOSE 153* 168*  167* 130* 112*  BUN 20 28*  27* 25* 21  CREATININE 1.31* 1.42*  1.39* 1.40* 1.34*  CALCIUM 9.4 9.4  9.3 9.4 9.4  MG  --  2.0  --   --    Liver Function Tests: Recent Labs  Lab 06/10/20 0945 06/11/20 0650 06/12/20 0638 06/13/20 0446  AST 33 32 31 20  ALT 64* 64* 59* 50*  ALKPHOS 62 51 50 51  BILITOT 0.9 1.0 0.8 0.9  PROT 7.2 6.8 6.5 6.3*  ALBUMIN 4.0 3.9 3.8 3.7   No results for input(s): LIPASE, AMYLASE in the last 168 hours. No results for input(s): AMMONIA in the last 168 hours. Coagulation Profile: Recent Labs  Lab 06/11/20 0650  INR 1.5*   CBC: Recent Labs  Lab 06/10/20 0945 06/11/20 0650 06/12/20 0638 06/13/20 0446  WBC 10.3 13.8* 12.3* 11.1*  NEUTROABS 5.4 9.9* 6.2 5.6  HGB 13.4 13.4 13.7 13.8  HCT 42.3 41.3 43.8 43.4  MCV 95.9 96.3 95.2 94.8  PLT 313 290 323 294   Cardiac Enzymes: No results for input(s): CKTOTAL, CKMB, CKMBINDEX, TROPONINI in the last 168 hours. BNP: Invalid input(s): POCBNP CBG: Recent Labs  Lab 06/10/20 1419 06/11/20 0006 06/12/20 2044 06/13/20 0811  GLUCAP 154* 159* 178* 148*   HbA1C: Recent Labs    06/11/20 0650  HGBA1C 7.4*   Urine analysis:    Component Value Date/Time   COLORURINE YELLOW 07/16/2011 [place] 07/16/2011 1407   LABSPEC 1.010 07/16/2011 1407   PHURINE 6.0 07/16/2011 1407   GLUCOSEU NEGATIVE 07/16/2011 1407   HGBUR TRACE (A) 07/16/2011 1407   BILIRUBINUR NEGATIVE 07/16/2011 1407   KETONESUR NEGATIVE 07/16/2011 1407   PROTEINUR NEGATIVE 07/16/2011 1407   UROBILINOGEN 0.2 07/16/2011 1407   NITRITE NEGATIVE 07/16/2011 1407   LEUKOCYTESUR SMALL (A) 07/16/2011 1407   Sepsis Labs: @LABRCNTIP (procalcitonin:4,lacticidven:4) ) Recent Results (from the past 240 hour(s))  Resp Panel by RT-PCR (Flu A&B, Covid)  Nasopharyngeal Swab     Status: Abnormal   Collection Time: 06/10/20  1:27 PM   Specimen: Nasopharyngeal Swab; Nasopharyngeal(NP) swabs in vial transport medium  Result Value Ref Range Status   SARS Coronavirus 2 by RT PCR POSITIVE (A) NEGATIVE Final    Comment: RESULT CALLED TO, READ BACK BY AND VERIFIED WITH: MURPHY,E RN @1901  06/10/20 BILLINGSLEY,L (NOTE) SARS-CoV-2 target nucleic acids are DETECTED.  The SARS-CoV-2 RNA is generally detectable in upper respiratory specimens during the acute phase of infection. Positive results are indicative of the presence of the identified virus, but do not rule out bacterial infection or co-infection with other pathogens not detected by the test. Clinical correlation with patient history and other diagnostic information is necessary to determine patient infection status. The expected result is Negative.  Fact Sheet for Patients: EntrepreneurPulse.com.au  Fact Sheet for Healthcare Providers: IncredibleEmployment.be  This test is not yet approved or cleared by the [place] FDA and  has been authorized for detection and/or diagnosis of SARS-CoV-2 by FDA under an Emergency Use Authorization (EUA).  This EUA will remain in effect (meaning this test c an be used) for the duration of  the COVID-19 declaration under Section  564(b)(1) of the Act, 21 U.S.C. section 360bbb-3(b)(1), unless the authorization is terminated or revoked sooner.     Influenza A by PCR NEGATIVE NEGATIVE Final   Influenza B by PCR NEGATIVE NEGATIVE Final    Comment: (NOTE) The Xpert Xpress SARS-CoV-2/FLU/RSV plus assay is intended as an aid in the diagnosis of influenza from Nasopharyngeal swab specimens and should not be used as a sole basis for treatment. Nasal washings and aspirates are unacceptable for Xpert Xpress SARS-CoV-2/FLU/RSV testing.  Fact Sheet for Patients: EntrepreneurPulse.com.au  Fact Sheet for  Healthcare Providers: IncredibleEmployment.be  This test is not yet approved or cleared by the [place] FDA and has been authorized for detection and/or diagnosis of SARS-CoV-2 by FDA under an Emergency Use Authorization (EUA). This EUA will remain in effect (meaning this test can be used) for the duration of the COVID-19 declaration under Section 564(b)(1) of the Act, 21 U.S.C. section 360bbb-3(b)(1), unless the authorization is terminated or revoked.  Performed at [facility], [address], [place], [place] [address]   MRSA PCR Screening     Status: None   Collection Time: 06/10/20  2:50 PM   Specimen: Nasal Mucosa; Nasopharyngeal  Result Value Ref Range Status   MRSA by PCR NEGATIVE NEGATIVE Final    Comment:        The GeneXpert MRSA Assay (FDA approved for NASAL specimens only), is one component of a comprehensive MRSA colonization surveillance program. It is not intended to diagnose MRSA infection nor to guide or monitor treatment for MRSA infections. Performed at [facility], [address], [place], [place] [address]      Scheduled Meds: . allopurinol  300 mg Oral Daily  . apixaban  5 mg Oral BID  . vitamin C  500 mg Oral Daily  . Chlorhexidine Gluconate Cloth  6 each Topical Q0600  . diltiazem  360 mg Oral Daily  . guaiFENesin  1,200 mg Oral BID  . insulin glargine  30 Units Subcutaneous Q2200  . metoprolol tartrate  50 mg Oral TID  . multivitamin with minerals  1 tablet Oral Daily  . pantoprazole  40 mg Oral Daily  . sodium chloride flush  3 mL Intravenous Q12H  . sodium chloride flush  3 mL Intravenous Q12H  . zinc sulfate  220 mg Oral Daily   Continuous Infusions: . sodium chloride    . diltiazem (CARDIZEM) infusion 10 mg/hr (06/13/20 0700)  . remdesivir 100 mg in NS 100 mL 100 mg (06/13/20 0858)    Procedures/Studies: DG Chest Port 1 View  Result Date: 06/10/2020 CLINICAL DATA:  Chest pain, shortness of breath, weakness  EXAM: PORTABLE CHEST 1 VIEW COMPARISON:  01/07/2017 FINDINGS: The heart size and mediastinal contours are within normal limits. Both lungs are clear. The visualized skeletal structures are unremarkable. IMPRESSION: No active disease. Electronically Signed   By: Miachel Roux M.D.   On: 06/10/2020 10:36   ECHOCARDIOGRAM COMPLETE  Result Date: 06/11/2020    ECHOCARDIOGRAM REPORT   Patient Name:   NORLAN RANN Date of Exam: 06/11/2020 Medical Rec #:  [id number]        Height:       73.0 in Accession #:    [id number]       Weight:       387.8 lb Date of Birth:  1950-12-01         BSA:          2.851 m Patient Age:    88 years  BP:           184/98 mmHg Patient Gender: M                HR:           94 bpm. Exam Location:  [facility] Procedure: 2D Echo Indications:    Atrial Fibrillation I48.91  History:        Patient has prior history of Echocardiogram examinations, most                 recent 07/19/2011. Arrythmias:Atrial Flutter and Atrial                 Fibrillation; Risk Factors:Former Smoker, Hypertension and                 Diabetes. Pneumonia.  Sonographer:    Leavy Cella RDCS (AE) Referring Phys: [id number] Erol Flanagin IMPRESSIONS  1. Left ventricular ejection fraction, by estimation, is 55 to 60%. The left ventricle has normal function. The left ventricle has no regional wall motion abnormalities. There is moderate left ventricular hypertrophy. Left ventricular diastolic parameters are indeterminate.  2. Right ventricular systolic function is normal. The right ventricular size is normal.  3. The mitral valve is normal in structure. No evidence of mitral valve regurgitation. No evidence of mitral stenosis.  4. The aortic valve is tricuspid. There is mild calcification of the aortic valve. There is mild thickening of the aortic valve. Aortic valve regurgitation is not visualized. No aortic stenosis is present.  5. Mild pulmonary HTN, PASP is 31 mmHg.  6. The inferior vena cava is dilated in size with >50%  respiratory variability, suggesting right atrial pressure of 8 mmHg. FINDINGS  Left Ventricle: Left ventricular ejection fraction, by estimation, is 55 to 60%. The left ventricle has normal function. The left ventricle has no regional wall motion abnormalities. The left ventricular internal cavity size was normal in size. There is  moderate left ventricular hypertrophy. Left ventricular diastolic parameters are indeterminate. Right Ventricle: The right ventricular size is normal. No increase in right ventricular wall thickness. Right ventricular systolic function is normal. Left Atrium: Left atrial size was normal in size. Right Atrium: Right atrial size was normal in size. Pericardium: There is no evidence of pericardial effusion. Mitral Valve: The mitral valve is normal in structure. No evidence of mitral valve regurgitation. No evidence of mitral valve stenosis. Tricuspid Valve: The tricuspid valve is normal in structure. Tricuspid valve regurgitation is mild . No evidence of tricuspid stenosis. Aortic Valve: The aortic valve is tricuspid. There is mild calcification of the aortic valve. There is mild thickening of the aortic valve. There is mild aortic valve annular calcification. Aortic valve regurgitation is not visualized. No aortic stenosis  is present. Aortic valve mean gradient measures 1.9 mmHg. Aortic valve peak gradient measures 3.0 mmHg. Aortic valve area, by VTI measures 2.57 cm. Pulmonic Valve: The pulmonic valve was not well visualized. Pulmonic valve regurgitation is not visualized. No evidence of pulmonic stenosis. Aorta: The aortic root is normal in size and structure. Pulmonary Artery: Mild pulmonary HTN, PASP is 31 mmHg. Venous: The inferior vena cava is dilated in size with greater than 50% respiratory variability, suggesting right atrial pressure of 8 mmHg. IAS/Shunts: The interatrial septum was not well visualized.  LEFT VENTRICLE PLAX 2D LVIDd:         4.09 cm  Diastology LVIDs:          2.81 cm  LV e' medial:  9.46 cm/s LV PW:         1.40 cm  LV E/e' medial:  14.4 LV IVS:        1.42 cm  LV e' lateral:   14.70 cm/s LVOT diam:     2.10 cm  LV E/e' lateral: 9.3 LV SV:         51 LV SV Index:   18 LVOT Area:     3.46 cm  RIGHT VENTRICLE RV S prime:     8.45 cm/s TAPSE (M-mode): 2.1 cm LEFT ATRIUM             Index       RIGHT ATRIUM           Index LA diam:        4.30 cm 1.51 cm/m  RA Area:     18.00 cm LA Vol (A2C):   66.6 ml 23.36 ml/m RA Volume:   49.80 ml  17.47 ml/m LA Vol (A4C):   68.4 ml 23.99 ml/m LA Biplane Vol: 69.2 ml 24.27 ml/m  AORTIC VALVE AV Area (Vmax):    3.06 cm AV Area (Vmean):   2.70 cm AV Area (VTI):     2.57 cm AV Vmax:           86.87 cm/s AV Vmean:          65.721 cm/s AV VTI:            0.197 m AV Peak Grad:      3.0 mmHg AV Mean Grad:      1.9 mmHg LVOT Vmax:         76.69 cm/s LVOT Vmean:        51.172 cm/s LVOT VTI:          0.146 m LVOT/AV VTI ratio: 0.74  AORTA Ao Root diam: 3.50 cm MITRAL VALVE                TRICUSPID VALVE MV Area (PHT): 4.44 cm     TR Peak grad:   22.7 mmHg MV Decel Time: 171 msec     TR Vmax:        238.00 cm/s MV E velocity: 136.00 cm/s MV A velocity: 39.80 cm/s   SHUNTS MV E/A ratio:  3.42         Systemic VTI:  0.15 m                             Systemic Diam: 2.10 cm Carlyle Dolly MD Electronically signed by Carlyle Dolly MD Signature Date/Time: 06/11/2020/1:42:22 PM    Final     Orson Eva, DO  [facility]  If 7PM-7AM, please contact night-coverage [email or web address] Password [email or web address] 06/13/2020, 5:19 PM   LOS: 3 days

## 2020-06-14 DIAGNOSIS — I10 Essential (primary) hypertension: Secondary | ICD-10-CM

## 2020-06-14 DIAGNOSIS — U071 COVID-19: Secondary | ICD-10-CM | POA: Diagnosis not present

## 2020-06-14 DIAGNOSIS — I4892 Unspecified atrial flutter: Secondary | ICD-10-CM | POA: Diagnosis not present

## 2020-06-14 LAB — COMPREHENSIVE METABOLIC PANEL
ALT: 47 U/L — ABNORMAL HIGH (ref 0–44)
AST: 23 U/L (ref 15–41)
Albumin: 3.7 g/dL (ref 3.5–5.0)
Alkaline Phosphatase: 56 U/L (ref 38–126)
Anion gap: 10 (ref 5–15)
BUN: 22 mg/dL (ref 8–23)
CO2: 26 mmol/L (ref 22–32)
Calcium: 9.7 mg/dL (ref 8.9–10.3)
Chloride: 103 mmol/L (ref 98–111)
Creatinine, Ser: 1.44 mg/dL — ABNORMAL HIGH (ref 0.61–1.24)
GFR, Estimated: 53 mL/min — ABNORMAL LOW (ref >60)
Glucose, Bld: 148 mg/dL — ABNORMAL HIGH (ref 70–99)
Potassium: 4.1 mmol/L (ref 3.5–5.1)
Sodium: 139 mmol/L (ref 135–145)
Total Bilirubin: 1 mg/dL (ref 0.3–1.2)
Total Protein: 6.5 g/dL (ref 6.5–8.1)

## 2020-06-14 LAB — CBC WITH DIFFERENTIAL/PLATELET
Abs Immature Granulocytes: 0.05 10*3/uL (ref 0.00–0.07)
Basophils Absolute: 0.1 10*3/uL (ref 0.0–0.1)
Basophils Relative: 0 %
Eosinophils Absolute: 0.2 10*3/uL (ref 0.0–0.5)
Eosinophils Relative: 2 %
HCT: 47.4 % (ref 39.0–52.0)
Hemoglobin: 15.3 g/dL (ref 13.0–17.0)
Immature Granulocytes: 0 %
Lymphocytes Relative: 33 %
Lymphs Abs: 4.5 10*3/uL — ABNORMAL HIGH (ref 0.7–4.0)
MCH: 30.4 pg (ref 26.0–34.0)
MCHC: 32.3 g/dL (ref 30.0–36.0)
MCV: 94.2 fL (ref 80.0–100.0)
Monocytes Absolute: 1.1 10*3/uL — ABNORMAL HIGH (ref 0.1–1.0)
Monocytes Relative: 8 %
Neutro Abs: 7.6 10*3/uL (ref 1.7–7.7)
Neutrophils Relative %: 57 %
Platelets: 315 10*3/uL (ref 150–400)
RBC: 5.03 MIL/uL (ref 4.22–5.81)
RDW: 15 % (ref 11.5–15.5)
WBC: 13.5 10*3/uL — ABNORMAL HIGH (ref 4.0–10.5)
nRBC: 0 % (ref 0.0–0.2)

## 2020-06-14 LAB — C-REACTIVE PROTEIN: CRP: 0.6 mg/dL (ref <1.0)

## 2020-06-14 LAB — GLUCOSE, CAPILLARY: Glucose-Capillary: 144 mg/dL — ABNORMAL HIGH (ref 70–99)

## 2020-06-14 LAB — FERRITIN: Ferritin: 33 ng/mL (ref 24–336)

## 2020-06-14 LAB — D-DIMER, QUANTITATIVE: D-Dimer, Quant: 0.35 ug/mL-FEU (ref 0.00–0.50)

## 2020-06-14 MED ORDER — METOPROLOL TARTRATE 50 MG PO TABS
50.0000 mg | ORAL_TABLET | Freq: Once | ORAL | Status: AC
  Administered 2020-06-14: 50 mg via ORAL
  Filled 2020-06-14: qty 1

## 2020-06-14 MED ORDER — FUROSEMIDE 40 MG PO TABS
40.0000 mg | ORAL_TABLET | Freq: Once | ORAL | Status: AC
  Administered 2020-06-14: 40 mg via ORAL
  Filled 2020-06-14: qty 1

## 2020-06-14 MED ORDER — METOPROLOL TARTRATE 50 MG PO TABS: 100.0000 mg | ORAL_TABLET | Freq: Two times a day (BID) | ORAL | Status: DC

## 2020-06-14 MED ORDER — METOPROLOL TARTRATE 100 MG PO TABS: 100.0000 mg | ORAL_TABLET | Freq: Two times a day (BID) | ORAL | 1 refills | Status: DC

## 2020-06-14 MED ORDER — APIXABAN 5 MG PO TABS: 5.0000 mg | ORAL_TABLET | Freq: Two times a day (BID) | ORAL | 1 refills | Status: DC

## 2020-06-14 MED ORDER — TRAZODONE HCL 50 MG PO TABS
50.0000 mg | ORAL_TABLET | Freq: Once | ORAL | Status: AC
  Administered 2020-06-14: 50 mg via ORAL

## 2020-06-14 MED ORDER — DILTIAZEM HCL ER COATED BEADS 360 MG PO CP24: 360.0000 mg | ORAL_CAPSULE | Freq: Every day | ORAL | 1 refills | Status: DC

## 2020-06-14 NOTE — Progress Notes (Signed)
Patient given discharge instructions packet. This RN went over instructions and medication details. Patient stated understand and no stated he had no questions. Patient taken down via wheelchair to personal vehicle by nurse tech to be discharged home.

## 2020-06-14 NOTE — Discharge Summary (Signed)
Physician Discharge Summary  Kevin Goodman TOI:[id number] DOB: [birth year] DOA: 06/10/2020  PCP: Lucia Gaskins, MD  Admit date: 06/10/2020 Discharge date: 06/14/2020  Admitted From: Home Disposition:  Home   Recommendations for Outpatient Follow-up:  1. Follow up with PCP in 1-2 weeks 2. Please obtain BMP/CBC in one week    Discharge Condition: Stable CODE STATUS: FULL Diet recommendation: Heart Healthy / Carb Modified    Brief/Interim Summary: 70 year old male with a history of diabetes mellitus type 2, dysphagia, hypertension, remote atrial fibrillation, and morbid obesity presenting with elevated heart rate. The patient was at his gastroenterology office when he was noted to have heart rate >140.He was directed to the emergency department for further evaluation. In the emergency department, EKG showed atrial flutter with heart rate in the 140s. The patient states that he has been having palpitations with increase in all exertional dyspnea for the past 3 to 4 days. In addition, the patient has noted an increasing cough with yellow sputum. The patient himself denied any chest pain, fevers, chills, headache, nausea, vomiting, direct abdominal pain. He has noted some increasing lower extremity edema over the past several weeks. In the emergency department, the patient was given metoprolol IV and diltiazem IV. He remained in atrial flutter. Cardiology was consulted to assist with management. Notably, the patient was noted to have a positive Covid RT-PCR.  Discharge Diagnoses:  Atrial flutter with RVR -Currently on diltiazem drip -HR 95-105 -increased po diltiazem to 90 q 6hrs>>cardizem CD 360 mg -TSH 4.010 -Echo--EF 55-60, no WMA -CHADS-VASc = 3 -Continue apixaban -appreciate cardiology consult--may need DCCV in 3 weeks if not converted back to sinus -d/c home with metoprolol 100 mg bid  COVID-19 pneumonia -Personally reviewed chest x-ray--increased interstitial  markings -The patient was incidentally positive -He has been vaccinated x3 -continueremdesivir D#3 -CRP0.6>>0.5>>0.6 -Ferritin33>>39>>36 -D-dimer0.27>>0.30>>0.27 -Currently stable on room air -Vitamin C and zinc -received 3 day remdesivir -remained stable on RA thrughout hospitalization  Essential hypertension -Discontinue amlodipine -Continuediltiazemand metoprolol as discussed above  Uncontrolled diabetes mellitus type 2 with hyperglycemia -04/21/2016 hemoglobin A1c 14.1 -06/11/20 A1c--7.4 -NovoLog sliding scale -Continue Lantus  Morbid obesity -BMI 51.16 -Lifestyle modification  Gouty arthritis -Continue allopurinol  Discharge Instructions   Allergies as of 06/14/2020   No Known Allergies     Medication List    STOP taking these medications   amLODipine 5 MG tablet Commonly known as: NORVASC   insulin glargine 100 UNIT/ML Solostar Pen Commonly known as: LANTUS   labetalol 200 MG tablet Commonly known as: NORMODYNE   lidocaine 5 % Commonly known as: Lidoderm   methocarbamol 500 MG tablet Commonly known as: ROBAXIN   naproxen 500 MG tablet Commonly known as: NAPROSYN     TAKE these medications   allopurinol 300 MG tablet Commonly known as: ZYLOPRIM Take 300 mg by mouth daily.   apixaban 5 MG Tabs tablet Commonly known as: ELIQUIS Take 1 tablet (5 mg total) by mouth 2 (two) times daily.   aspirin 81 MG EC tablet Take 1 tablet (81 mg total) by mouth daily.   diltiazem 360 MG 24 hr capsule Commonly known as: CARDIZEM CD Take 1 capsule (360 mg total) by mouth daily. Start taking on: June 15, 2020   insulin aspart 100 UNIT/ML FlexPen Commonly known as: NovoLOG FlexPen CBG < 70: implement hypoglycemia protocol-call MD CBG 70 - 120: 0 units CBG 121 - 150: 2 units CBG 151 - 200: 3 units CBG 201 - 250: 5 units CBG 251 - 300:  8 units CBG 301 - 350: 11 units CBG 351 - 400: 15 units CBG > 400: call MD   metFORMIN 500 MG tablet Commonly  known as: GLUCOPHAGE Take 500 mg by mouth 2 (two) times daily with a meal.   metoprolol tartrate 100 MG tablet Commonly known as: LOPRESSOR Take 1 tablet (100 mg total) by mouth 2 (two) times daily. What changed:   medication strength  how much to take   multivitamin with minerals Tabs tablet Take 1 tablet by mouth.   pantoprazole 40 MG tablet Commonly known as: PROTONIX Take 1 tablet (40 mg total) by mouth daily. 30 minutes before breakfast   Tresiba FlexTouch 100 UNIT/ML FlexTouch Pen Generic drug: insulin degludec Inject 30 Units into the skin daily at 6 (six) AM.       Follow-up Information    Erma Heritage, PA-C Follow up on 07/03/2020.   Specialties: Physician Assistant, Cardiology Why: [facility] Follow-up on 07/03/2020 at 3:30. Please call the office if needing a different date or time.  Contact information: [place] [address] [phone]              No Known Allergies  Consultations:  cardiology   Procedures/Studies: DG Chest Port 1 View  Result Date: 06/10/2020 CLINICAL DATA:  Chest pain, shortness of breath, weakness EXAM: PORTABLE CHEST 1 VIEW COMPARISON:  01/07/2017 FINDINGS: The heart size and mediastinal contours are within normal limits. Both lungs are clear. The visualized skeletal structures are unremarkable. IMPRESSION: No active disease. Electronically Signed   By: Miachel Roux M.D.   On: 06/10/2020 10:36   ECHOCARDIOGRAM COMPLETE  Result Date: 06/11/2020    ECHOCARDIOGRAM REPORT   Patient Name:   Kevin Goodman Date of Exam: 06/11/2020 Medical Rec #:  [id number]        Height:       73.0 in Accession #:    [id number]       Weight:       387.8 lb Date of Birth:  1951/02/20         BSA:          2.851 m Patient Age:    70 years         BP:           184/98 mmHg Patient Gender: M                HR:           94 bpm. Exam Location:  [facility] Procedure: 2D Echo Indications:    Atrial Fibrillation I48.91  History:         Patient has prior history of Echocardiogram examinations, most                 recent 07/19/2011. Arrythmias:Atrial Flutter and Atrial                 Fibrillation; Risk Factors:Former Smoker, Hypertension and                 Diabetes. Pneumonia.  Sonographer:    Leavy Cella RDCS (AE) Referring Phys: [phone] Kord Monette IMPRESSIONS  1. Left ventricular ejection fraction, by estimation, is 55 to 60%. The left ventricle has normal function. The left ventricle has no regional wall motion abnormalities. There is moderate left ventricular hypertrophy. Left ventricular diastolic parameters are indeterminate.  2. Right ventricular systolic function is normal. The right ventricular size is normal.  3. The mitral valve is normal in  structure. No evidence of mitral valve regurgitation. No evidence of mitral stenosis.  4. The aortic valve is tricuspid. There is mild calcification of the aortic valve. There is mild thickening of the aortic valve. Aortic valve regurgitation is not visualized. No aortic stenosis is present.  5. Mild pulmonary HTN, PASP is 31 mmHg.  6. The inferior vena cava is dilated in size with >50% respiratory variability, suggesting right atrial pressure of 8 mmHg. FINDINGS  Left Ventricle: Left ventricular ejection fraction, by estimation, is 55 to 60%. The left ventricle has normal function. The left ventricle has no regional wall motion abnormalities. The left ventricular internal cavity size was normal in size. There is  moderate left ventricular hypertrophy. Left ventricular diastolic parameters are indeterminate. Right Ventricle: The right ventricular size is normal. No increase in right ventricular wall thickness. Right ventricular systolic function is normal. Left Atrium: Left atrial size was normal in size. Right Atrium: Right atrial size was normal in size. Pericardium: There is no evidence of pericardial effusion. Mitral Valve: The mitral valve is normal in structure. No evidence of mitral valve  regurgitation. No evidence of mitral valve stenosis. Tricuspid Valve: The tricuspid valve is normal in structure. Tricuspid valve regurgitation is mild . No evidence of tricuspid stenosis. Aortic Valve: The aortic valve is tricuspid. There is mild calcification of the aortic valve. There is mild thickening of the aortic valve. There is mild aortic valve annular calcification. Aortic valve regurgitation is not visualized. No aortic stenosis  is present. Aortic valve mean gradient measures 1.9 mmHg. Aortic valve peak gradient measures 3.0 mmHg. Aortic valve area, by VTI measures 2.57 cm. Pulmonic Valve: The pulmonic valve was not well visualized. Pulmonic valve regurgitation is not visualized. No evidence of pulmonic stenosis. Aorta: The aortic root is normal in size and structure. Pulmonary Artery: Mild pulmonary HTN, PASP is 31 mmHg. Venous: The inferior vena cava is dilated in size with greater than 50% respiratory variability, suggesting right atrial pressure of 8 mmHg. IAS/Shunts: The interatrial septum was not well visualized.  LEFT VENTRICLE PLAX 2D LVIDd:         4.09 cm  Diastology LVIDs:         2.81 cm  LV e' medial:    9.46 cm/s LV PW:         1.40 cm  LV E/e' medial:  14.4 LV IVS:        1.42 cm  LV e' lateral:   14.70 cm/s LVOT diam:     2.10 cm  LV E/e' lateral: 9.3 LV SV:         51 LV SV Index:   18 LVOT Area:     3.46 cm  RIGHT VENTRICLE RV S prime:     8.45 cm/s TAPSE (M-mode): 2.1 cm LEFT ATRIUM             Index       RIGHT ATRIUM           Index LA diam:        4.30 cm 1.51 cm/m  RA Area:     18.00 cm LA Vol (A2C):   66.6 ml 23.36 ml/m RA Volume:   49.80 ml  17.47 ml/m LA Vol (A4C):   68.4 ml 23.99 ml/m LA Biplane Vol: 69.2 ml 24.27 ml/m  AORTIC VALVE AV Area (Vmax):    3.06 cm AV Area (Vmean):   2.70 cm AV Area (VTI):     2.57 cm AV Vmax:  86.87 cm/s AV Vmean:          65.721 cm/s AV VTI:            0.197 m AV Peak Grad:      3.0 mmHg AV Mean Grad:      1.9 mmHg LVOT Vmax:          76.69 cm/s LVOT Vmean:        51.172 cm/s LVOT VTI:          0.146 m LVOT/AV VTI ratio: 0.74  AORTA Ao Root diam: 3.50 cm MITRAL VALVE                TRICUSPID VALVE MV Area (PHT): 4.44 cm     TR Peak grad:   22.7 mmHg MV Decel Time: 171 msec     TR Vmax:        238.00 cm/s MV E velocity: 136.00 cm/s MV A velocity: 39.80 cm/s   SHUNTS MV E/A ratio:  3.42         Systemic VTI:  0.15 m                             Systemic Diam: 2.10 cm Carlyle Dolly MD Electronically signed by Carlyle Dolly MD Signature Date/Time: 06/11/2020/1:42:22 PM    Final          Discharge Exam: Vitals:   06/14/20 1000 06/14/20 1100  BP:    Pulse: 81   Resp: 20   Temp:  98.2 F (36.8 C)  SpO2: 99%    Vitals:   06/14/20 0800 06/14/20 0900 06/14/20 1000 06/14/20 1100  BP: 140/86     Pulse: (!) 46 89 81   Resp: 18 (!) 21 20   Temp:    98.2 F (36.8 C)  TempSrc:    Oral  SpO2: 99% 98% 99%   Weight:      Height:        General: Pt is alert, awake, not in acute distress Cardiovascular: IRRR, S1/S2 +, no rubs, no gallops Respiratory: fine bibasilar rales. No wheeze Abdominal: Soft, NT, ND, bowel sounds + Extremities: 1+LE edema, no cyanosis   The results of significant diagnostics from this hospitalization (including imaging, microbiology, ancillary and laboratory) are listed below for reference.    Significant Diagnostic Studies: DG Chest Port 1 View  Result Date: 06/10/2020 CLINICAL DATA:  Chest pain, shortness of breath, weakness EXAM: PORTABLE CHEST 1 VIEW COMPARISON:  01/07/2017 FINDINGS: The heart size and mediastinal contours are within normal limits. Both lungs are clear. The visualized skeletal structures are unremarkable. IMPRESSION: No active disease. Electronically Signed   By: Miachel Roux M.D.   On: 06/10/2020 10:36   ECHOCARDIOGRAM COMPLETE  Result Date: 06/11/2020    ECHOCARDIOGRAM REPORT   Patient Name:   CADARIUS NEVARES Date of Exam: 06/11/2020 Medical Rec #:  [id number]         Height:       73.0 in Accession #:    [id number]       Weight:       387.8 lb Date of Birth:  07-Sep-1950         BSA:          2.851 m Patient Age:    49 years         BP:           184/98 mmHg Patient Gender: M  HR:           94 bpm. Exam Location:  [facility] Procedure: 2D Echo Indications:    Atrial Fibrillation I48.91  History:        Patient has prior history of Echocardiogram examinations, most                 recent 07/19/2011. Arrythmias:Atrial Flutter and Atrial                 Fibrillation; Risk Factors:Former Smoker, Hypertension and                 Diabetes. Pneumonia.  Sonographer:    Leavy Cella RDCS (AE) Referring Phys: [phone] Torez Beauregard IMPRESSIONS  1. Left ventricular ejection fraction, by estimation, is 55 to 60%. The left ventricle has normal function. The left ventricle has no regional wall motion abnormalities. There is moderate left ventricular hypertrophy. Left ventricular diastolic parameters are indeterminate.  2. Right ventricular systolic function is normal. The right ventricular size is normal.  3. The mitral valve is normal in structure. No evidence of mitral valve regurgitation. No evidence of mitral stenosis.  4. The aortic valve is tricuspid. There is mild calcification of the aortic valve. There is mild thickening of the aortic valve. Aortic valve regurgitation is not visualized. No aortic stenosis is present.  5. Mild pulmonary HTN, PASP is 31 mmHg.  6. The inferior vena cava is dilated in size with >50% respiratory variability, suggesting right atrial pressure of 8 mmHg. FINDINGS  Left Ventricle: Left ventricular ejection fraction, by estimation, is 55 to 60%. The left ventricle has normal function. The left ventricle has no regional wall motion abnormalities. The left ventricular internal cavity size was normal in size. There is  moderate left ventricular hypertrophy. Left ventricular diastolic parameters are indeterminate. Right Ventricle: The right ventricular size  is normal. No increase in right ventricular wall thickness. Right ventricular systolic function is normal. Left Atrium: Left atrial size was normal in size. Right Atrium: Right atrial size was normal in size. Pericardium: There is no evidence of pericardial effusion. Mitral Valve: The mitral valve is normal in structure. No evidence of mitral valve regurgitation. No evidence of mitral valve stenosis. Tricuspid Valve: The tricuspid valve is normal in structure. Tricuspid valve regurgitation is mild . No evidence of tricuspid stenosis. Aortic Valve: The aortic valve is tricuspid. There is mild calcification of the aortic valve. There is mild thickening of the aortic valve. There is mild aortic valve annular calcification. Aortic valve regurgitation is not visualized. No aortic stenosis  is present. Aortic valve mean gradient measures 1.9 mmHg. Aortic valve peak gradient measures 3.0 mmHg. Aortic valve area, by VTI measures 2.57 cm. Pulmonic Valve: The pulmonic valve was not well visualized. Pulmonic valve regurgitation is not visualized. No evidence of pulmonic stenosis. Aorta: The aortic root is normal in size and structure. Pulmonary Artery: Mild pulmonary HTN, PASP is 31 mmHg. Venous: The inferior vena cava is dilated in size with greater than 50% respiratory variability, suggesting right atrial pressure of 8 mmHg. IAS/Shunts: The interatrial septum was not well visualized.  LEFT VENTRICLE PLAX 2D LVIDd:         4.09 cm  Diastology LVIDs:         2.81 cm  LV e' medial:    9.46 cm/s LV PW:         1.40 cm  LV E/e' medial:  14.4 LV IVS:        1.42 cm  LV e' lateral:   14.70 cm/s LVOT diam:     2.10 cm  LV E/e' lateral: 9.3 LV SV:         51 LV SV Index:   18 LVOT Area:     3.46 cm  RIGHT VENTRICLE RV S prime:     8.45 cm/s TAPSE (M-mode): 2.1 cm LEFT ATRIUM             Index       RIGHT ATRIUM           Index LA diam:        4.30 cm 1.51 cm/m  RA Area:     18.00 cm LA Vol (A2C):   66.6 ml 23.36 ml/m RA Volume:    49.80 ml  17.47 ml/m LA Vol (A4C):   68.4 ml 23.99 ml/m LA Biplane Vol: 69.2 ml 24.27 ml/m  AORTIC VALVE AV Area (Vmax):    3.06 cm AV Area (Vmean):   2.70 cm AV Area (VTI):     2.57 cm AV Vmax:           86.87 cm/s AV Vmean:          65.721 cm/s AV VTI:            0.197 m AV Peak Grad:      3.0 mmHg AV Mean Grad:      1.9 mmHg LVOT Vmax:         76.69 cm/s LVOT Vmean:        51.172 cm/s LVOT VTI:          0.146 m LVOT/AV VTI ratio: 0.74  AORTA Ao Root diam: 3.50 cm MITRAL VALVE                TRICUSPID VALVE MV Area (PHT): 4.44 cm     TR Peak grad:   22.7 mmHg MV Decel Time: 171 msec     TR Vmax:        238.00 cm/s MV E velocity: 136.00 cm/s MV A velocity: 39.80 cm/s   SHUNTS MV E/A ratio:  3.42         Systemic VTI:  0.15 m                             Systemic Diam: 2.10 cm Carlyle Dolly MD Electronically signed by Carlyle Dolly MD Signature Date/Time: 06/11/2020/1:42:22 PM    Final      Microbiology: Recent Results (from the past 240 hour(s))  Resp Panel by RT-PCR (Flu A&B, Covid) Nasopharyngeal Swab     Status: Abnormal   Collection Time: 06/10/20  1:27 PM   Specimen: Nasopharyngeal Swab; Nasopharyngeal(NP) swabs in vial transport medium  Result Value Ref Range Status   SARS Coronavirus 2 by RT PCR POSITIVE (A) NEGATIVE Final    Comment: RESULT CALLED TO, READ BACK BY AND VERIFIED WITH: MURPHY,E RN @1901  06/10/20 BILLINGSLEY,L (NOTE) SARS-CoV-2 target nucleic acids are DETECTED.  The SARS-CoV-2 RNA is generally detectable in upper respiratory specimens during the acute phase of infection. Positive results are indicative of the presence of the identified virus, but do not rule out bacterial infection or co-infection with other pathogens not detected by the test. Clinical correlation with patient history and other diagnostic information is necessary to determine patient infection status. The expected result is Negative.  Fact Sheet for  Patients: EntrepreneurPulse.com.au  Fact Sheet for Healthcare Providers: IncredibleEmployment.be  This test is not yet  approved or cleared by the [place] and  has been authorized for detection and/or diagnosis of SARS-CoV-2 by FDA under an Emergency Use Authorization (EUA).  This EUA will remain in effect (meaning this test c an be used) for the duration of  the COVID-19 declaration under Section 564(b)(1) of the Act, 21 U.S.C. section 360bbb-3(b)(1), unless the authorization is terminated or revoked sooner.     Influenza A by PCR NEGATIVE NEGATIVE Final   Influenza B by PCR NEGATIVE NEGATIVE Final    Comment: (NOTE) The Xpert Xpress SARS-CoV-2/FLU/RSV plus assay is intended as an aid in the diagnosis of influenza from Nasopharyngeal swab specimens and should not be used as a sole basis for treatment. Nasal washings and aspirates are unacceptable for Xpert Xpress SARS-CoV-2/FLU/RSV testing.  Fact Sheet for Patients: EntrepreneurPulse.com.au  Fact Sheet for Healthcare Providers: IncredibleEmployment.be  This test is not yet approved or cleared by the [place] FDA and has been authorized for detection and/or diagnosis of SARS-CoV-2 by FDA under an Emergency Use Authorization (EUA). This EUA will remain in effect (meaning this test can be used) for the duration of the COVID-19 declaration under Section 564(b)(1) of the Act, 21 U.S.C. section 360bbb-3(b)(1), unless the authorization is terminated or revoked.  Performed at [facility], [address], [place], [place] [address]   MRSA PCR Screening     Status: None   Collection Time: 06/10/20  2:50 PM   Specimen: Nasal Mucosa; Nasopharyngeal  Result Value Ref Range Status   MRSA by PCR NEGATIVE NEGATIVE Final    Comment:        The GeneXpert MRSA Assay (FDA approved for NASAL specimens only), is one component of a comprehensive MRSA  colonization surveillance program. It is not intended to diagnose MRSA infection nor to guide or monitor treatment for MRSA infections. Performed at [facility], [address] [place], [place] [address]      Labs: Basic Metabolic Panel: Recent Labs  Lab 06/10/20 0945 06/11/20 0650 06/12/20 0638 06/13/20 0446 06/14/20 0513  NA 141 138  137 139 140 139  K 4.7 4.7  4.7 4.3 4.0 4.1  CL 108 103  101 102 104 103  CO2 24 24  24 27 25 26   GLUCOSE 153* 168*  167* 130* 112* 148*  BUN 20 28*  27* 25* 21 22  CREATININE 1.31* 1.42*  1.39* 1.40* 1.34* 1.44*  CALCIUM 9.4 9.4  9.3 9.4 9.4 9.7  MG  --  2.0  --   --   --    Liver Function Tests: Recent Labs  Lab 06/10/20 0945 06/11/20 0650 06/12/20 0638 06/13/20 0446 06/14/20 0513  AST 33 32 31 20 23   ALT 64* 64* 59* 50* 47*  ALKPHOS 62 51 50 51 56  BILITOT 0.9 1.0 0.8 0.9 1.0  PROT 7.2 6.8 6.5 6.3* 6.5  ALBUMIN 4.0 3.9 3.8 3.7 3.7   No results for input(s): LIPASE, AMYLASE in the last 168 hours. No results for input(s): AMMONIA in the last 168 hours. CBC: Recent Labs  Lab 06/10/20 0945 06/11/20 0650 06/12/20 9924 06/13/20 0446 06/14/20 0513  WBC 10.3 13.8* 12.3* 11.1* 13.5*  NEUTROABS 5.4 9.9* 6.2 5.6 7.6  HGB 13.4 13.4 13.7 13.8 15.3  HCT 42.3 41.3 43.8 43.4 47.4  MCV 95.9 96.3 95.2 94.8 94.2  PLT 313 290 323 294 315   Cardiac Enzymes: No results for input(s): CKTOTAL, CKMB, CKMBINDEX, TROPONINI in the last 168 hours. BNP: Invalid input(s): POCBNP CBG: Recent  Labs  Lab 06/11/20 0006 06/12/20 2044 06/13/20 0811 06/13/20 2030 06/14/20 0733  GLUCAP 159* 178* 148* 160* 144*    Time coordinating discharge:  36 minutes  Signed:  Orson Eva, DO [facility] Pager: [phone] 06/14/2020, 11:31 AM

## 2020-07-03 ENCOUNTER — Other Ambulatory Visit: Payer: Self-pay

## 2020-07-03 ENCOUNTER — Ambulatory Visit: Payer: Medicare PPO | Admitting: Student

## 2020-07-03 ENCOUNTER — Encounter: Payer: Self-pay | Admitting: Student

## 2020-07-03 VITALS — BP 152/116 | HR 150 | Ht 73.0 in | Wt 388.0 lb

## 2020-07-03 DIAGNOSIS — I4892 Unspecified atrial flutter: Secondary | ICD-10-CM | POA: Diagnosis not present

## 2020-07-03 DIAGNOSIS — G4733 Obstructive sleep apnea (adult) (pediatric): Secondary | ICD-10-CM

## 2020-07-03 DIAGNOSIS — R600 Localized edema: Secondary | ICD-10-CM

## 2020-07-03 DIAGNOSIS — I10 Essential (primary) hypertension: Secondary | ICD-10-CM | POA: Diagnosis not present

## 2020-07-03 DIAGNOSIS — Z01818 Encounter for other preprocedural examination: Secondary | ICD-10-CM | POA: Diagnosis not present

## 2020-07-03 MED ORDER — FUROSEMIDE 20 MG PO TABS: 20.0000 mg | ORAL_TABLET | Freq: Every day | ORAL | 3 refills | Status: DC

## 2020-07-03 NOTE — Patient Instructions (Signed)
Medication Instructions:  Your physician recommends that you continue on your current medications as directed. Please refer to the Current Medication list given to you today.  Start Lasix 20 mg Daily   *If you need a refill on your cardiac medications before your next appointment, please call your pharmacy*   Lab Work: Your physician recommends that you return for lab work the day of pre op.   If you have labs (blood work) drawn today and your tests are completely normal, you will receive your results only by: Marland Kitchen MyChart Message (if you have MyChart) OR . A paper copy in the mail If you have any lab test that is abnormal or we need to change your treatment, we will call you to review the results.   Testing/Procedures: Your physician has recommended that you have a Cardioversion (DCCV). Electrical Cardioversion uses a jolt of electricity to your heart either through paddles or wired patches attached to your chest. This is a controlled, usually prescheduled, procedure. Defibrillation is done under light anesthesia in the hospital, and you usually go home the day of the procedure. This is done to get your heart back into a normal rhythm. You are not awake for the procedure. Please see the instruction sheet given to you today.   Follow-Up: At [facility], you and your health needs are our priority.  As part of our continuing mission to provide you with exceptional heart care, we have created designated Provider Care Teams.  These Care Teams include your primary Cardiologist (physician) and Advanced Practice Providers (APPs -  Physician Assistants and Nurse Practitioners) who all work together to provide you with the care you need, when you need it.  We recommend signing up for the patient portal called "MyChart".  Sign up information is provided on this After Visit Summary.  MyChart is used to connect with patients for Virtual Visits (Telemedicine).  Patients are able to view lab/test results,  encounter notes, upcoming appointments, etc.  Non-urgent messages can be sent to your provider as well.   To learn more about what you can do with MyChart, go to [email or web address].    Your next appointment:    May 27 @ 3:30 pm   The format for your next appointment:   In Person  Provider:   Bernerd Pho, PA-C   Other Instructions Thank you for choosing [facility]!

## 2020-07-03 NOTE — Progress Notes (Signed)
Cardiology Office Note    Date:  07/03/2020   ID:  Kevin Goodman, DOB [birth year], MRN [id number]  PCP:  Lucia Gaskins, MD  Cardiologist: Carlyle Dolly, MD    Chief Complaint  Patient presents with  . Hospitalization Follow-up    History of Present Illness:    Kevin Goodman is a 70 y.o. male with past medical history of HTN, Type 2 DM, OSA (not on CPAP) and remote history of atrial fibrillation/flutter (occurring in 07/2011 with conversion back to NSR with IV Amiodarone and IV Cardizem) who presents to the office today for hospital follow-up.  He most recently presented to [facility] ED on 06/10/2020 after being found tachycardic at an outpatient GI appointment.  He reported worsening dyspnea over the past several months but denied any associated chest pain or palpitations. Was found to be in atrial flutter with RVR on admission and was started on IV Cardizem for rate control along with being restarted on Eliquis 5 mg twice daily for anticoagulation. Was found to have COVID-19 pneumonia during admission and was treated with Remdesivir. His rates did improve with IV Cardizem and this was transitioned to Cardizem CD 360 mg daily and Lopressor 100mg  BID at the time of discharge.  He was not eligible for TEE/DCCV during admission given his COVID-positive status, therefore was recommended to consider DCCV in 3 weeks if compliant with anticoagulation  In talking with the patient today, he reports overall being unaware of his arrhythmia as he denies any chest pain or palpitations. He does experience intermittent dyspnea on exertion but denies any orthopnea or PND. He has experienced lower extremity edema since hospital discharge. His heart rate is in the 150's during today's visit but he reports having not yet taken his Lopressor or Cardizem CD and he does have his pills with him.  He reports good compliance with Eliquis and denies missing any doses. No reported melena, hematochezia or  hematuria.   Past Medical History:  Diagnosis Date  . Atrial fibrillation ([place])   . Diabetes mellitus without complication ([place])   . GERD (gastroesophageal reflux disease)   . Gout   . Hypertension     Past Surgical History:  Procedure Laterality Date  . COLONOSCOPY N/A 12/08/2016   sigmoid and descending colon diverticula, four semi-pedunculated polyps in descending and ascendign colon, few polyps in descending and ascending colon (tubular adenomas). 3 year surveillance.   . ESOPHAGOGASTRODUODENOSCOPY N/A 12/08/2016   normal esophagus s/p empiric dilation, small hiatal hernia.   Marland Kitchen FINGER SURGERY     left ring  . INCISION AND DRAINAGE OF WOUND Left 04/20/2016   Procedure: LEFT THIGH DEBRIDEMENT;  Surgeon: Mickeal Skinner, MD;  Location: [facility];  Service: General;  Laterality: Left;  Marland Kitchen MALONEY DILATION N/A 12/08/2016   Procedure: Venia Minks DILATION;  Surgeon: Daneil Dolin, MD;  [facility];  Service: Endoscopy;  Laterality: N/A;  . POLYPECTOMY  12/08/2016   Procedure: POLYPECTOMY;  Surgeon: Daneil Dolin, MD;  [facility];  Service: Endoscopy;;  colon    Current Medications: Outpatient Medications Prior to Visit  Medication Sig Dispense Refill  . allopurinol (ZYLOPRIM) 300 MG tablet Take 300 mg by mouth daily.    Marland Kitchen apixaban (ELIQUIS) 5 MG TABS tablet Take 1 tablet (5 mg total) by mouth 2 (two) times daily. 60 tablet 1  . diltiazem (CARDIZEM CD) 360 MG 24 hr capsule Take 1 capsule (360 mg total) by mouth daily. 30 capsule 1  .  metFORMIN (GLUCOPHAGE) 500 MG tablet Take 500 mg by mouth 2 (two) times daily with a meal.    . metoprolol tartrate (LOPRESSOR) 100 MG tablet Take 1 tablet (100 mg total) by mouth 2 (two) times daily. 60 tablet 1  . pantoprazole (PROTONIX) 40 MG tablet Take 1 tablet (40 mg total) by mouth daily. 30 minutes before breakfast 90 tablet 3  . TRESIBA FLEXTOUCH 100 UNIT/ML FlexTouch Pen Inject 30 Units into the skin daily at 6 (six) AM.     . aspirin EC 81 MG EC tablet Take 1 tablet (81 mg total) by mouth daily.    . insulin aspart (NOVOLOG FLEXPEN) 100 UNIT/ML FlexPen CBG < 70: implement hypoglycemia protocol-call MD CBG 70 - 120: 0 units CBG 121 - 150: 2 units CBG 151 - 200: 3 units CBG 201 - 250: 5 units CBG 251 - 300: 8 units CBG 301 - 350: 11 units CBG 351 - 400: 15 units CBG > 400: call MD 15 mL 0  . Multiple Vitamin (MULTIVITAMIN WITH MINERALS) TABS tablet Take 1 tablet by mouth.     No facility-administered medications prior to visit.     Allergies:   Patient has no known allergies.   Social History   Socioeconomic History  . Marital status: Widowed    Spouse name: Not on file  . Number of children: Not on file  . Years of education: Not on file  . Highest education level: Not on file  Occupational History  . Occupation: Public house manager: [employer]  Tobacco Use  . Smoking status: Former Smoker    Types: Cigars  . Smokeless tobacco: Never Used  Vaping Use  . Vaping Use: Never used  Substance and Sexual Activity  . Alcohol use: Yes    Comment: occasional shot of liquour every now and then   . Drug use: No  . Sexual activity: Yes  Other Topics Concern  . Not on file  Social History Narrative  . Not on file   Social Determinants of Health   Financial Resource Strain: Not on file  Food Insecurity: Not on file  Transportation Needs: Not on file  Physical Activity: Not on file  Stress: Not on file  Social Connections: Not on file     Family History:  The patient's family history includes Heart failure in his mother; Hypertension in his father and mother; Prostate cancer in his brother and father; Stroke in his brother and father.   Review of Systems:    Please see the history of present illness.     All other systems reviewed and are otherwise negative except as noted above.   Physical Exam:    VS:  BP (!) 152/116   Pulse (!) 150   Ht 6\' 1"  (1.854 m)   Wt (!) 388 lb (176  kg)   SpO2 98%   BMI 51.19 kg/m    General: Well developed, obese male appearing in no acute distress. Head: Normocephalic, atraumatic. Neck: No carotid bruits. JVD not elevated.  Lungs: Respirations regular and unlabored, without wheezes or rales.  Heart: Irregularly irregular, tachycardiac. No S3 or S4.  No murmur, no rubs, or gallops appreciated. Abdomen: Appears non-distended. No obvious abdominal masses. Msk:  Strength and tone appear normal for age. No obvious joint deformities or effusions. Extremities: No clubbing or cyanosis. 1+ pitting edema up to mid-shins bilaterally.  Distal pedal pulses are 2+ bilaterally. Neuro: Alert and oriented X 3. Moves all extremities spontaneously. No  focal deficits noted. Psych:  Responds to questions appropriately with a normal affect. Skin: No rashes or lesions noted  Wt Readings from Last 3 Encounters:  07/03/20 (!) 388 lb (176 kg)  06/14/20 (!) 385 lb 9.4 oz (174.9 kg)  06/10/20 (!) 393 lb 3.2 oz (178.4 kg)     Studies/Labs Reviewed:   EKG:  EKG is ordered today.  The ekg ordered today demonstrates atrial flutter with RVR, HR 152.  Recent Labs: 06/10/2020: B Natriuretic Peptide 410.0; TSH 4.010 06/11/2020: Magnesium 2.0 06/14/2020: ALT 47; BUN 22; Creatinine, Ser 1.44; Hemoglobin 15.3; Platelets 315; Potassium 4.1; Sodium 139   Lipid Panel    Component Value Date/Time   CHOL 160 07/19/2011 0458   TRIG 139 07/19/2011 0458   HDL 38 (L) 07/19/2011 0458   CHOLHDL 4.2 07/19/2011 0458   VLDL 28 07/19/2011 0458   LDLCALC 94 07/19/2011 0458    Additional studies/ records that were reviewed today include:   Echocardiogram: 06/11/2020 IMPRESSIONS    1. Left ventricular ejection fraction, by estimation, is 55 to 60%. The  left ventricle has normal function. The left ventricle has no regional  wall motion abnormalities. There is moderate left ventricular hypertrophy.  Left ventricular diastolic  parameters are indeterminate.  2. Right  ventricular systolic function is normal. The right ventricular  size is normal.  3. The mitral valve is normal in structure. No evidence of mitral valve  regurgitation. No evidence of mitral stenosis.  4. The aortic valve is tricuspid. There is mild calcification of the  aortic valve. There is mild thickening of the aortic valve. Aortic valve  regurgitation is not visualized. No aortic stenosis is present.  5. Mild pulmonary HTN, PASP is 31 mmHg.  6. The inferior vena cava is dilated in size with >50% respiratory  variability, suggesting right atrial pressure of 8 mmHg.   Assessment:    1. Atrial flutter with rapid ventricular response ([place])   2. Pre-op evaluation   3. Bilateral lower extremity edema   4. Essential hypertension   5. OSA (obstructive sleep apnea)      Plan:   In order of problems listed above:  1. Atrial Flutter with RVR - He has a remote history of atrial fibrillation/flutter in 2013 but no known recurrence until his recent admission. He remains in atrial flutter with RVR during today's examination. He just took his medications during the visit, therefore did not titrate at this time. Will continue Lopressor 100mg  BID and Cardizem CD 360mg  daily.  - He reports good compliance with Eliquis and denies missing any recent doses. Will plan for DCCV next week as he would have been on anticoagulation for at least 3 weeks. Reviewed with Dr. Domenic Polite (DOD) as well. The procedure along with risks and benefits were reviewed and the patient agrees to proceed. Will recheck CBC and BMET.   2. Lower Extremity Edema - Likely due to his elevated HR. He was receiving IV Lasix during admission but was not discharged on Lasix. Will provide Rx for Lasix 20mg  daily. Recheck BMET in 1 week (creatinine 1.3 - 1.4 during his recent admission).   3. HTN - BP is elevated at 152/116 today but he has not yet taken his Cardizem CD or Lopressor (took during visit). Says his BP was  well-controlled when recently checked by his PCP. Will continue current regimen for now.   4. OSA - He reports being told 10+ years ago he had sleep apnea but was intolerant to CPAP at  that time due to claustrophobia. He seems somewhat hesitant to pursuing a repeat sleep study but says he would be open to it in the future. Would readdress at follow-up.     Shared Decision Making/Informed Consent:   Shared Decision Making/Informed Consent The risks (stroke, cardiac arrhythmias rarely resulting in the need for a temporary or permanent pacemaker, skin irritation or burns and complications associated with conscious sedation including aspiration, arrhythmia, respiratory failure and death), benefits (restoration of normal sinus rhythm) and alternatives of a direct current cardioversion were explained in detail to Mr. Youngerman and he agrees to proceed.          Medication Adjustments/Labs and Tests Ordered: Current medicines are reviewed at length with the patient today.  Concerns regarding medicines are outlined above.  Medication changes, Labs and Tests ordered today are listed in the Patient Instructions below. Patient Instructions  Medication Instructions:  Your physician recommends that you continue on your current medications as directed. Please refer to the Current Medication list given to you today.  Start Lasix 20 mg Daily   *If you need a refill on your cardiac medications before your next appointment, please call your pharmacy*   Lab Work: Your physician recommends that you return for lab work the day of pre op.   If you have labs (blood work) drawn today and your tests are completely normal, you will receive your results only by: Marland Kitchen MyChart Message (if you have MyChart) OR . A paper copy in the mail If you have any lab test that is abnormal or we need to change your treatment, we will call you to review the results.   Testing/Procedures: Your physician has recommended that  you have a Cardioversion (DCCV). Electrical Cardioversion uses a jolt of electricity to your heart either through paddles or wired patches attached to your chest. This is a controlled, usually prescheduled, procedure. Defibrillation is done under light anesthesia in the hospital, and you usually go home the day of the procedure. This is done to get your heart back into a normal rhythm. You are not awake for the procedure. Please see the instruction sheet given to you today.   Follow-Up: At [facility], you and your health needs are our priority.  As part of our continuing mission to provide you with exceptional heart care, we have created designated Provider Care Teams.  These Care Teams include your primary Cardiologist (physician) and Advanced Practice Providers (APPs -  Physician Assistants and Nurse Practitioners) who all work together to provide you with the care you need, when you need it.  We recommend signing up for the patient portal called "MyChart".  Sign up information is provided on this After Visit Summary.  MyChart is used to connect with patients for Virtual Visits (Telemedicine).  Patients are able to view lab/test results, encounter notes, upcoming appointments, etc.  Non-urgent messages can be sent to your provider as well.   To learn more about what you can do with MyChart, go to [email or web address].    Your next appointment:    May 27 @ 3:30 pm   The format for your next appointment:   In Person  Provider:   Bernerd Pho, PA-C   Other Instructions Thank you for choosing [facility]!       Signed, Erma Heritage, PA-C  07/03/2020 7:05 PM    [facility] [address] [place], [place] [address] Phone: [phone] Fax: [phone]

## 2020-07-04 ENCOUNTER — Telehealth: Payer: Self-pay

## 2020-07-04 NOTE — Patient Instructions (Signed)
Kevin Goodman  07/04/2020     @PREFPERIOPPHARMACY @   Your procedure is scheduled on  07/09/2020   Report to [facility] at  0800  A.M.   Call this number if you have problems the morning of surgery:  [phone]   Remember:  Do not eat or drink after midnight.                     Take 15 units of tresiba the night before your procedure.  DO NOT take any medications for diabetes the morning of your procedure.  If your glucose is 70 or below the morning of your procedure, drink 1/2 cup of clear liquid containing sugar and recheck your glucose in 15 minutes. If your glucose is still 70 or below, call [phone] for instructions.  If your glucose is 300 or above the morning of your procedure, call [phone] for instructions.    Take these medicines the morning of surgery with A SIP OF WATER  Diltiazem, protonix.     Please brush your teeth.  Do not wear jewelry, make-up or nail polish.  Do not wear lotions, powders, or perfumes, or deodorant.  Do not shave 48 hours prior to surgery.  Men may shave face and neck.  Do not bring valuables to the hospital.  [facility] is not responsible for any belongings or valuables.  Contacts, dentures or bridgework may not be worn into surgery.  Leave your suitcase in the car.  After surgery it may be brought to your room.  For patients admitted to the hospital, discharge time will be determined by your treatment team.  Patients discharged the day of surgery will not be allowed to drive home and must have someone with them for 24 hours.    Special instructions:  DO NOT smoke tobacco or vape for 24 hours before your procedure.  Please read over the following fact sheets that you were given. Anesthesia Post-op Instructions and Care and Recovery After Surgery       Electrical Cardioversion Electrical cardioversion is the delivery of a jolt of electricity to restore a normal rhythm to the heart. A rhythm that is too  fast or is not regular keeps the heart from pumping well. In this procedure, sticky patches or metal paddles are placed on the chest to deliver electricity to the heart from a device. This procedure may be done in an emergency if:  There is low or no blood pressure as a result of the heart rhythm.  Normal rhythm must be restored as fast as possible to protect the brain and heart from further damage.  It may save a life. This may also be a scheduled procedure for irregular or fast heart rhythms that are not immediately life-threatening. Tell a health care provider about:  Any allergies you have.  All medicines you are taking, including vitamins, herbs, eye drops, creams, and over-the-counter medicines.  Any problems you or family members have had with anesthetic medicines.  Any blood disorders you have.  Any surgeries you have had.  Any medical conditions you have.  Whether you are pregnant or may be pregnant. What are the risks? Generally, this is a safe procedure. However, problems may occur, including:  Allergic reactions to medicines.  A blood clot that breaks free and travels to other parts of your body.  The possible return of an abnormal heart rhythm within hours or days after the procedure.  Your heart  stopping (cardiac arrest). This is rare. What happens before the procedure? Medicines  Your health care provider may have you start taking: ? Blood-thinning medicines (anticoagulants) so your blood does not clot as easily. ? Medicines to help stabilize your heart rate and rhythm.  Ask your health care provider about: ? Changing or stopping your regular medicines. This is especially important if you are taking diabetes medicines or blood thinners. ? Taking medicines such as aspirin and ibuprofen. These medicines can thin your blood. Do not take these medicines unless your health care provider tells you to take them. ? Taking over-the-counter medicines, vitamins, herbs,  and supplements. General instructions  Follow instructions from your health care provider about eating or drinking restrictions.  Plan to have someone take you home from the hospital or clinic.  If you will be going home right after the procedure, plan to have someone with you for 24 hours.  Ask your health care provider what steps will be taken to help prevent infection. These may include washing your skin with a germ-killing soap. What happens during the procedure?  An IV will be inserted into one of your veins.  Sticky patches (electrodes) or metal paddles may be placed on your chest.  You will be given a medicine to help you relax (sedative).  An electrical shock will be delivered. The procedure may vary among health care providers and hospitals.   What can I expect after the procedure?  Your blood pressure, heart rate, breathing rate, and blood oxygen level will be monitored until you leave the hospital or clinic.  Your heart rhythm will be watched to make sure it does not change.  You may have some redness on the skin where the shocks were given. Follow these instructions at home:  Do not drive for 24 hours if you were given a sedative during your procedure.  Take over-the-counter and prescription medicines only as told by your health care provider.  Ask your health care provider how to check your pulse. Check it often.  Rest for 48 hours after the procedure or as told by your health care provider.  Avoid or limit your caffeine use as told by your health care provider.  Keep all follow-up visits as told by your health care provider. This is important. Contact a health care provider if:  You feel like your heart is beating too quickly or your pulse is not regular.  You have a serious muscle cramp that does not go away. Get help right away if:  You have discomfort in your chest.  You are dizzy or you feel faint.  You have trouble breathing or you are short of  breath.  Your speech is slurred.  You have trouble moving an arm or leg on one side of your body.  Your fingers or toes turn cold or blue. Summary  Electrical cardioversion is the delivery of a jolt of electricity to restore a normal rhythm to the heart.  This procedure may be done right away in an emergency or may be a scheduled procedure if the condition is not an emergency.  Generally, this is a safe procedure.  After the procedure, check your pulse often as told by your health care provider. This information is not intended to replace advice given to you by your health care provider. Make sure you discuss any questions you have with your health care provider. Document Revised: 09/25/2018 Document Reviewed: 09/25/2018 Elsevier Patient Education  [place], Care  After This sheet gives you information about how to care for yourself after your procedure. Your health care provider may also give you more specific instructions. If you have problems or questions, contact your health care provider. What can I expect after the procedure? After the procedure, it is common to have:  Tiredness.  Forgetfulness about what happened after the procedure.  Impaired judgment for important decisions.  Nausea or vomiting.  Some difficulty with balance. Follow these instructions at home: For the time period you were told by your health care provider:  Rest as needed.  Do not participate in activities where you could fall or become injured.  Do not drive or use machinery.  Do not drink alcohol.  Do not take sleeping pills or medicines that cause drowsiness.  Do not make important decisions or sign legal documents.  Do not take care of children on your own.      Eating and drinking  Follow the diet that is recommended by your health care provider.  Drink enough fluid to keep your urine pale yellow.  If you vomit: ? Drink water, juice, or soup  when you can drink without vomiting. ? Make sure you have little or no nausea before eating solid foods. General instructions  Have a responsible adult stay with you for the time you are told. It is important to have someone help care for you until you are awake and alert.  Take over-the-counter and prescription medicines only as told by your health care provider.  If you have sleep apnea, surgery and certain medicines can increase your risk for breathing problems. Follow instructions from your health care provider about wearing your sleep device: ? Anytime you are sleeping, including during daytime naps. ? While taking prescription pain medicines, sleeping medicines, or medicines that make you drowsy.  Avoid smoking.  Keep all follow-up visits as told by your health care provider. This is important. Contact a health care provider if:  You keep feeling nauseous or you keep vomiting.  You feel light-headed.  You are still sleepy or having trouble with balance after 24 hours.  You develop a rash.  You have a fever.  You have redness or swelling around the IV site. Get help right away if:  You have trouble breathing.  You have new-onset confusion at home. Summary  For several hours after your procedure, you may feel tired. You may also be forgetful and have poor judgment.  Have a responsible adult stay with you for the time you are told. It is important to have someone help care for you until you are awake and alert.  Rest as told. Do not drive or operate machinery. Do not drink alcohol or take sleeping pills.  Get help right away if you have trouble breathing, or if you suddenly become confused. This information is not intended to replace advice given to you by your health care provider. Make sure you discuss any questions you have with your health care provider. Document Revised: 11/08/2019 Document Reviewed: 01/25/2019 Elsevier Patient Education  2021 [employer].

## 2020-07-04 NOTE — Telephone Encounter (Signed)
I spoke with patient and gave him the following information. Pre op is Wednesday 07/07/20 at 8:30 am .Cardioversion scheduled for Wednesday 07/09/20 at 9:30 am.

## 2020-07-07 ENCOUNTER — Other Ambulatory Visit: Payer: Self-pay

## 2020-07-07 ENCOUNTER — Telehealth: Payer: Self-pay

## 2020-07-07 ENCOUNTER — Other Ambulatory Visit (HOSPITAL_COMMUNITY)
Admission: RE | Admit: 2020-07-07 | Discharge: 2020-07-07 | Disposition: A | Discharge status: Home / Self Care | Payer: Medicare PPO | Source: Ambulatory Visit | Attending: Cardiology | Admitting: Cardiology

## 2020-07-07 ENCOUNTER — Encounter (HOSPITAL_COMMUNITY): Payer: Self-pay

## 2020-07-07 ENCOUNTER — Encounter (HOSPITAL_COMMUNITY)
Admission: RE | Admit: 2020-07-07 | Discharge: 2020-07-07 | Disposition: A | Discharge status: Home / Self Care | Payer: Medicare PPO | Source: Ambulatory Visit | Attending: Cardiology | Admitting: Cardiology

## 2020-07-07 DIAGNOSIS — Z01812 Encounter for preprocedural laboratory examination: Secondary | ICD-10-CM | POA: Insufficient documentation

## 2020-07-07 DIAGNOSIS — Z20822 Contact with and (suspected) exposure to covid-19: Secondary | ICD-10-CM | POA: Diagnosis not present

## 2020-07-07 LAB — BASIC METABOLIC PANEL
Anion gap: 12 (ref 5–15)
BUN: 19 mg/dL (ref 8–23)
CO2: 24 mmol/L (ref 22–32)
Calcium: 9.2 mg/dL (ref 8.9–10.3)
Chloride: 101 mmol/L (ref 98–111)
Creatinine, Ser: 1.36 mg/dL — ABNORMAL HIGH (ref 0.61–1.24)
GFR, Estimated: 57 mL/min — ABNORMAL LOW (ref >60)
Glucose, Bld: 166 mg/dL — ABNORMAL HIGH (ref 70–99)
Potassium: 4.1 mmol/L (ref 3.5–5.1)
Sodium: 137 mmol/L (ref 135–145)

## 2020-07-07 LAB — CBC WITH DIFFERENTIAL/PLATELET
Abs Immature Granulocytes: 0.12 10*3/uL — ABNORMAL HIGH (ref 0.00–0.07)
Basophils Absolute: 0.1 10*3/uL (ref 0.0–0.1)
Basophils Relative: 1 %
Eosinophils Absolute: 0.1 10*3/uL (ref 0.0–0.5)
Eosinophils Relative: 1 %
HCT: 42 % (ref 39.0–52.0)
Hemoglobin: 13.2 g/dL (ref 13.0–17.0)
Immature Granulocytes: 1 %
Lymphocytes Relative: 28 %
Lymphs Abs: 3.9 10*3/uL (ref 0.7–4.0)
MCH: 29.4 pg (ref 26.0–34.0)
MCHC: 31.4 g/dL (ref 30.0–36.0)
MCV: 93.5 fL (ref 80.0–100.0)
Monocytes Absolute: 0.9 10*3/uL (ref 0.1–1.0)
Monocytes Relative: 7 %
Neutro Abs: 8.9 10*3/uL — ABNORMAL HIGH (ref 1.7–7.7)
Neutrophils Relative %: 62 %
Platelets: 398 10*3/uL (ref 150–400)
RBC: 4.49 MIL/uL (ref 4.22–5.81)
RDW: 14.6 % (ref 11.5–15.5)
WBC: 14 10*3/uL — ABNORMAL HIGH (ref 4.0–10.5)
nRBC: 0 % (ref 0.0–0.2)

## 2020-07-07 LAB — HEMOGLOBIN A1C
Hgb A1c MFr Bld: 7.9 % — ABNORMAL HIGH (ref 4.8–5.6)
Mean Plasma Glucose: 180.03 mg/dL

## 2020-07-07 LAB — SARS CORONAVIRUS 2 (TAT 6-24 HRS): SARS Coronavirus 2: NEGATIVE

## 2020-07-07 NOTE — Telephone Encounter (Signed)
-----   Message from Arnoldo Lenis, MD sent at 07/07/2020 12:33 PM EDT ----- Labs look fine overall, stable at his baseline values  Zandra Abts MD

## 2020-07-07 NOTE — Telephone Encounter (Signed)
Pt notified and voiced understanding 

## 2020-07-09 ENCOUNTER — Encounter (HOSPITAL_COMMUNITY)
Admission: RE | Disposition: A | Discharge status: Home / Self Care | Payer: Self-pay | Source: Ambulatory Visit | Attending: Cardiology

## 2020-07-09 ENCOUNTER — Ambulatory Visit (HOSPITAL_COMMUNITY): Payer: Medicare PPO | Admitting: Anesthesiology

## 2020-07-09 ENCOUNTER — Encounter (HOSPITAL_COMMUNITY): Payer: Self-pay | Admitting: Cardiology

## 2020-07-09 ENCOUNTER — Other Ambulatory Visit: Payer: Self-pay

## 2020-07-09 ENCOUNTER — Ambulatory Visit (HOSPITAL_COMMUNITY)
Admission: RE | Admit: 2020-07-09 | Discharge: 2020-07-09 | Disposition: A | Discharge status: Home / Self Care | Payer: Medicare PPO | Source: Ambulatory Visit | Attending: Cardiology | Admitting: Cardiology

## 2020-07-09 DIAGNOSIS — Z7982 Long term (current) use of aspirin: Secondary | ICD-10-CM | POA: Diagnosis not present

## 2020-07-09 DIAGNOSIS — Z794 Long term (current) use of insulin: Secondary | ICD-10-CM | POA: Insufficient documentation

## 2020-07-09 DIAGNOSIS — Z7901 Long term (current) use of anticoagulants: Secondary | ICD-10-CM | POA: Diagnosis not present

## 2020-07-09 DIAGNOSIS — R600 Localized edema: Secondary | ICD-10-CM | POA: Diagnosis not present

## 2020-07-09 DIAGNOSIS — Z8616 Personal history of COVID-19: Secondary | ICD-10-CM | POA: Diagnosis not present

## 2020-07-09 DIAGNOSIS — Z79899 Other long term (current) drug therapy: Secondary | ICD-10-CM | POA: Diagnosis not present

## 2020-07-09 DIAGNOSIS — I4892 Unspecified atrial flutter: Secondary | ICD-10-CM | POA: Insufficient documentation

## 2020-07-09 DIAGNOSIS — E119 Type 2 diabetes mellitus without complications: Secondary | ICD-10-CM | POA: Insufficient documentation

## 2020-07-09 DIAGNOSIS — Z87891 Personal history of nicotine dependence: Secondary | ICD-10-CM | POA: Diagnosis not present

## 2020-07-09 DIAGNOSIS — I10 Essential (primary) hypertension: Secondary | ICD-10-CM | POA: Insufficient documentation

## 2020-07-09 DIAGNOSIS — G4733 Obstructive sleep apnea (adult) (pediatric): Secondary | ICD-10-CM | POA: Diagnosis not present

## 2020-07-09 HISTORY — PX: CARDIOVERSION: SHX1299

## 2020-07-09 LAB — GLUCOSE, CAPILLARY
Glucose-Capillary: 143 mg/dL — ABNORMAL HIGH (ref 70–99)
Glucose-Capillary: 132 mg/dL — ABNORMAL HIGH (ref 70–99)

## 2020-07-09 SURGERY — CARDIOVERSION
Anesthesia: General

## 2020-07-09 MED ORDER — PROPOFOL 10 MG/ML IV BOLUS
INTRAVENOUS | Status: DC | PRN
  Administered 2020-07-09 (×2): 50 mg via INTRAVENOUS

## 2020-07-09 MED ORDER — PROPOFOL 10 MG/ML IV BOLUS
INTRAVENOUS | Status: AC
  Filled 2020-07-09: qty 40

## 2020-07-09 MED ORDER — FENTANYL CITRATE (PF) 100 MCG/2ML IJ SOLN
INTRAMUSCULAR | Status: AC
  Filled 2020-07-09: qty 2

## 2020-07-09 MED ORDER — MIDAZOLAM HCL 2 MG/2ML IJ SOLN
INTRAMUSCULAR | Status: AC
  Filled 2020-07-09: qty 2

## 2020-07-09 MED ORDER — LACTATED RINGERS IV SOLN: INTRAVENOUS | Status: DC

## 2020-07-09 MED ORDER — ORAL CARE MOUTH RINSE: 15.0000 mL | Freq: Once | OROMUCOSAL | Status: DC

## 2020-07-09 MED ORDER — CHLORHEXIDINE GLUCONATE 0.12 % MT SOLN: 15.0000 mL | Freq: Once | OROMUCOSAL | Status: DC

## 2020-07-09 NOTE — Progress Notes (Signed)
DR Harl Bowie  In to see pt- HR has remained 60's - 70's SBP 120'130's. He will make med changes to discharge.

## 2020-07-09 NOTE — CV Procedure (Addendum)
CV Procedure Note  Procedure: Electrical cardioversion Physician: Dr Carlyle Dolly Indication: atrial flutter    Patient was brought to the procedure suite after appropriate consent was obtained. I confirmed with the patient verbally he had not missed any doses of his eliquis within the last 3 weeks. Defib pad placed in the anterior and posterior positions. Sedation achieved with the assistance of anesthesiology, please refer to there notes for details. Succesfully cardioverted from atrial flutter to sinus rhythm with a single synchronized 200j shock. Cardiopulmonary monitoring performed throughout, he tolerated well without complications  Rates low 60s after cardioversion. We will d/c his lopressor now that he is out of the aflutter, continue his diltiazem.   Carlyle Dolly MD

## 2020-07-09 NOTE — H&P (Signed)
CV Procedure H&P  Patient presents for outpatient electrical cardioversion in setting of atrial flutter. Please see referenced recent clinic note below for full history. He has been on eliquis for anticoagulation at home. Plan for cardioversion today with assistance from anesthesiology.   Carlyle Dolly MD      Cardiology Office Note    Date:  07/03/2020   Kevin Goodman, DOB 20-Dec-1950, MRN [id number]  PCP:  Lucia Gaskins, MD            Cardiologist: Carlyle Dolly, MD       Chief Complaint  Patient presents with  . Hospitalization Follow-up    History of Present Illness:    Kevin Goodman is a 70 y.o. male with past medical history of HTN, Type 2 DM, OSA (not on CPAP)and remote history of atrial fibrillation/flutter (occurring in 07/2011 with conversion back to NSRwith IV Amiodarone and IV Cardizem) who presents to the office today for hospital follow-up.  He most recently presented to [facility] ED on 06/10/2020 after being found tachycardic at an outpatient GI appointment.  He reported worsening dyspnea over the past several months but denied any associated chest pain or palpitations. Was found to be in atrial flutter with RVR on admission and was started on IV Cardizem for rate control along with being restarted on Eliquis 5 mg twice daily for anticoagulation. Was found to have COVID-19 pneumonia during admission and was treated with Remdesivir. His rates did improve with IV Cardizem and this was transitioned to Cardizem CD 360 mg daily and Lopressor 100mg  BID at the time of discharge.  He was not eligible for TEE/DCCV during admission given his COVID-positive status, therefore was recommended to consider DCCV in 3 weeks if compliant with anticoagulation  In talking with the patient today, he reports overall being unaware of his arrhythmia as he denies any chest pain or palpitations. He does experience intermittent dyspnea on exertion but denies any orthopnea  or PND. He has experienced lower extremity edema since hospital discharge. His heart rate is in the 150's during today's visit but he reports having not yet taken his Lopressor or Cardizem CD and he does have his pills with him.  He reports good compliance with Eliquis and denies missing any doses. No reported melena, hematochezia or hematuria.       Past Medical History:  Diagnosis Date  . Atrial fibrillation ([place])   . Diabetes mellitus without complication ([place])   . GERD (gastroesophageal reflux disease)   . Gout   . Hypertension          Past Surgical History:  Procedure Laterality Date  . COLONOSCOPY N/A 12/08/2016   sigmoid and descending colon diverticula, four semi-pedunculated polyps in descending and ascendign colon, few polyps in descending and ascending colon (tubular adenomas). 3 year surveillance.   . ESOPHAGOGASTRODUODENOSCOPY N/A 12/08/2016   normal esophagus s/p empiric dilation, small hiatal hernia.   Marland Kitchen FINGER SURGERY     left ring  . INCISION AND DRAINAGE OF WOUND Left 04/20/2016   Procedure: LEFT THIGH DEBRIDEMENT;  Surgeon: Mickeal Skinner, MD;  Location: [place];  Service: General;  Laterality: Left;  Marland Kitchen MALONEY DILATION N/A 12/08/2016   Procedure: Venia Minks DILATION;  Surgeon: Daneil Dolin, MD;  [facility];  Service: Endoscopy;  Laterality: N/A;  . POLYPECTOMY  12/08/2016   Procedure: POLYPECTOMY;  Surgeon: Daneil Dolin, MD;  Location: [facility];  Service: Endoscopy;;  colon    Current Medications:  Outpatient Medications Prior to Visit  Medication Sig Dispense Refill  . allopurinol (ZYLOPRIM) 300 MG tablet Take 300 mg by mouth daily.    Marland Kitchen apixaban (ELIQUIS) 5 MG TABS tablet Take 1 tablet (5 mg total) by mouth 2 (two) times daily. 60 tablet 1  . diltiazem (CARDIZEM CD) 360 MG 24 hr capsule Take 1 capsule (360 mg total) by mouth daily. 30 capsule 1  . metFORMIN (GLUCOPHAGE) 500 MG tablet Take 500 mg by mouth 2  (two) times daily with a meal.    . metoprolol tartrate (LOPRESSOR) 100 MG tablet Take 1 tablet (100 mg total) by mouth 2 (two) times daily. 60 tablet 1  . pantoprazole (PROTONIX) 40 MG tablet Take 1 tablet (40 mg total) by mouth daily. 30 minutes before breakfast 90 tablet 3  . TRESIBA FLEXTOUCH 100 UNIT/ML FlexTouch Pen Inject 30 Units into the skin daily at 6 (six) AM.    . aspirin EC 81 MG EC tablet Take 1 tablet (81 mg total) by mouth daily.    . insulin aspart (NOVOLOG FLEXPEN) 100 UNIT/ML FlexPen CBG < 70: implement hypoglycemia protocol-call MD CBG 70 - 120: 0 units CBG 121 - 150: 2 units CBG 151 - 200: 3 units CBG 201 - 250: 5 units CBG 251 - 300: 8 units CBG 301 - 350: 11 units CBG 351 - 400: 15 units CBG > 400: call MD 15 mL 0  . Multiple Vitamin (MULTIVITAMIN WITH MINERALS) TABS tablet Take 1 tablet by mouth.     No facility-administered medications prior to visit.     Allergies:   Patient has no known allergies.   Social History        Socioeconomic History  . Marital status: Widowed    Spouse name: Not on file  . Number of children: Not on file  . Years of education: Not on file  . Highest education level: Not on file  Occupational History  . Occupation: Haematologist: [employer]  Tobacco Use  . Smoking status: Former Smoker    Types: Cigars  . Smokeless tobacco: Never Used  Vaping Use  . Vaping Use: Never used  Substance and Sexual Activity  . Alcohol use: Yes    Comment: occasional shot of liquour every now and then   . Drug use: No  . Sexual activity: Yes  Other Topics Concern  . Not on file  Social History Narrative  . Not on file   Social Determinants of Health   Financial Resource Strain: Not on file  Food Insecurity: Not on file  Transportation Needs: Not on file  Physical Activity: Not on file  Stress: Not on file  Social Connections: Not on file     Family History:  The patient's family history  includes Heart failure in his mother; Hypertension in his father and mother; Prostate cancer in his brother and father; Stroke in his brother and father.   Review of Systems:    Please see the history of present illness.     All other systems reviewed and are otherwise negative except as noted above.   Physical Exam:    VS:  BP (!) 152/116   Pulse (!) 150   Ht 6\' 1"  (1.854 m)   Wt (!) 388 lb (176 kg)   SpO2 98%   BMI 51.19 kg/m    General: Well developed, obese male appearing in no acute distress. Head: Normocephalic, atraumatic. Neck: No carotid bruits. JVD not elevated.  Lungs:  Respirations regular and unlabored, without wheezes or rales.  Heart: Irregularly irregular, tachycardiac. No S3 or S4.  No murmur, no rubs, or gallops appreciated. Abdomen: Appears non-distended. No obvious abdominal masses. Msk:  Strength and tone appear normal for age. No obvious joint deformities or effusions. Extremities: No clubbing or cyanosis. 1+ pitting edema up to mid-shins bilaterally.  Distal pedal pulses are 2+ bilaterally. Neuro: Alert and oriented X 3. Moves all extremities spontaneously. No focal deficits noted. Psych:  Responds to questions appropriately with a normal affect. Skin: No rashes or lesions noted     Wt Readings from Last 3 Encounters:  07/03/20 (!) 388 lb (176 kg)  06/14/20 (!) 385 lb 9.4 oz (174.9 kg)  06/10/20 (!) 393 lb 3.2 oz (178.4 kg)     Studies/Labs Reviewed:   EKG:  EKG is ordered today.  The ekg ordered today demonstrates atrial flutter with RVR, HR 152.  Recent Labs: 06/10/2020: B Natriuretic Peptide 410.0; TSH 4.010 06/11/2020: Magnesium 2.0 06/14/2020: ALT 47; BUN 22; Creatinine, Ser 1.44; Hemoglobin 15.3; Platelets 315; Potassium 4.1; Sodium 139   Lipid Panel Labs (Brief)          Component Value Date/Time   CHOL 160 07/19/2011 0458   TRIG 139 07/19/2011 0458   HDL 38 (L) 07/19/2011 0458   CHOLHDL 4.2 07/19/2011 0458   VLDL 28  07/19/2011 0458   LDLCALC 94 07/19/2011 0458      Additional studies/ records that were reviewed today include:   Echocardiogram: 06/11/2020 IMPRESSIONS    1. Left ventricular ejection fraction, by estimation, is 55 to 60%. The  left ventricle has normal function. The left ventricle has no regional  wall motion abnormalities. There is moderate left ventricular hypertrophy.  Left ventricular diastolic  parameters are indeterminate.  2. Right ventricular systolic function is normal. The right ventricular  size is normal.  3. The mitral valve is normal in structure. No evidence of mitral valve  regurgitation. No evidence of mitral stenosis.  4. The aortic valve is tricuspid. There is mild calcification of the  aortic valve. There is mild thickening of the aortic valve. Aortic valve  regurgitation is not visualized. No aortic stenosis is present.  5. Mild pulmonary HTN, PASP is 31 mmHg.  6. The inferior vena cava is dilated in size with >50% respiratory  variability, suggesting right atrial pressure of 8 mmHg.   Assessment:    1. Atrial flutter with rapid ventricular response ([place])   2. Pre-op evaluation   3. Bilateral lower extremity edema   4. Essential hypertension   5. OSA (obstructive sleep apnea)      Plan:   In order of problems listed above:  1. Atrial Flutter with RVR - He has a remote history of atrial fibrillation/flutter in 2013 but no known recurrence until his recent admission. He remains in atrial flutter with RVR during today's examination. He just took his medications during the visit, therefore did not titrate at this time. Will continue Lopressor 100mg  BID and Cardizem CD 360mg  daily.  - He reports good compliance with Eliquis and denies missing any recent doses. Will plan for DCCV next week as he would have been on anticoagulation for at least 3 weeks. Reviewed with Dr. Domenic Polite (DOD) as well. The procedure along with risks and benefits were  reviewed and the patient agrees to proceed. Will recheck CBC and BMET.   2. Lower Extremity Edema - Likely due to his elevated HR. He was receiving IV Lasix during admission  but was not discharged on Lasix. Will provide Rx for Lasix 20mg  daily. Recheck BMET in 1 week (creatinine 1.3 - 1.4 during his recent admission).   3. HTN - BP is elevated at 152/116 today but he has not yet taken his Cardizem CD or Lopressor (took during visit). Says his BP was well-controlled when recently checked by his PCP. Will continue current regimen for now.   4. OSA - He reports being told 10+ years ago he had sleep apnea but was intolerant to CPAP at that time due to claustrophobia. He seems somewhat hesitant to pursuing a repeat sleep study but says he would be open to it in the future. Would readdress at follow-up.     Shared Decision Making/Informed Consent:   Shared Decision Making/Informed Consent The risks (stroke, cardiac arrhythmias rarely resulting in the need for a temporary or permanent pacemaker, skin irritation or burns and complications associated with conscious sedation including aspiration, arrhythmia, respiratory failure and death), benefits (restoration of normal sinus rhythm) and alternatives of a direct current cardioversion were explained in detail to Mr. Bevill and he agrees to proceed.          Medication Adjustments/Labs and Tests Ordered: Current medicines are reviewed at length with the patient today.  Concerns regarding medicines are outlined above.  Medication changes, Labs and Tests ordered today are listed in the Patient Instructions below. Patient Instructions  Medication Instructions:  Your physician recommends that you continue on your current medications as directed. Please refer to the Current Medication list given to you today.  Start Lasix 20 mg Daily   *If you need a refill on your cardiac medications before your next appointment, please call your  pharmacy*   Lab Work: Your physician recommends that you return for lab work the day of pre op.   If you have labs (blood work) drawn today and your tests are completely normal, you will receive your results only by:  [place] (if you have MyChart) OR  A paper copy in the mail If you have any lab test that is abnormal or we need to change your treatment, we will call you to review the results.   Testing/Procedures: Your physician has recommended that you have a Cardioversion (DCCV). Electrical Cardioversion uses a jolt of electricity to your heart either through paddles or wired patches attached to your chest. This is a controlled, usually prescheduled, procedure. Defibrillation is done under light anesthesia in the hospital, and you usually go home the day of the procedure. This is done to get your heart back into a normal rhythm. You are not awake for the procedure. Please see the instruction sheet given to you today.   Follow-Up: At [facility], you and your health needs are our priority. As part of our continuing mission to provide you with exceptional heart care, we have created designated Provider Care Teams. These Care Teams include your primary Cardiologist (physician) and Advanced Practice Providers (APPs -  Physician Assistants and Nurse Practitioners) who all work together to provide you with the care you need, when you need it.  We recommend signing up for the patient portal called "MyChart".  Sign up information is provided on this After Visit Summary.  MyChart is used to connect with patients for Virtual Visits (Telemedicine).  Patients are able to view lab/test results, encounter notes, upcoming appointments, etc.  Non-urgent messages can be sent to your provider as well.   To learn more about what you can do with MyChart,  go to [email or web address].    Your next appointment:    May 27 @ 3:30 pm   The format for your next appointment:   In  Person  Provider:   Bernerd Pho, PA-C

## 2020-07-09 NOTE — Progress Notes (Signed)
Electrical Cardioversion Procedure Note Kevin Goodman [id number] 10-18-1950  Procedure: Electrical Cardioversion Indications: Atrial Procedure Details Consent:direct current cardioversion Time Out: Verified patient identification, verified procedure, site/side was marked, verified correct patient position, special equipment/implants available, medications/allergies/relevent history reviewed, required imaging and test results available.  Time out [phone] Patient placed on cardiac monitor, pulse oximetry, supplemental oxygen as necessary.  Sedation given:per CRNA. Pacer pads placed anterior/posterior Cardioverted 1 time(s).  Cardioverted at 200 JoulesEvaluation Findings: Post procedure EKG shows: NSRComplications: none Patient did tolerate procedure well.   [place] Kevin Goodman 07/09/2020, 10:00 AM

## 2020-07-09 NOTE — Anesthesia Postprocedure Evaluation (Signed)
Anesthesia Post Note  Patient: Kevin Goodman  Procedure(s) Performed: CARDIOVERSION (N/A )  Patient location during evaluation: Phase II Anesthesia Type: General Level of consciousness: awake Pain management: pain level controlled Vital Signs Assessment: post-procedure vital signs reviewed and stable Respiratory status: spontaneous breathing and respiratory function stable Cardiovascular status: blood pressure returned to baseline and stable Postop Assessment: no headache and no apparent nausea or vomiting Anesthetic complications: no Comments: Late entry   No complications documented.   Last Vitals:  Vitals:   07/09/20 1045 07/09/20 1054  BP: 139/85 (!) 141/89  Pulse:  64  Resp: 19 (!) 23  Temp:  36.5 C  SpO2: 100% 100%    Last Pain:  Vitals:   07/09/20 1054  TempSrc: Axillary  PainSc: 0-No pain                 Louann Sjogren

## 2020-07-09 NOTE — Anesthesia Preprocedure Evaluation (Signed)
Anesthesia Evaluation  Patient identified by MRN, date of birth, ID band Patient awake    Reviewed: Allergy & Precautions, H&P , NPO status , Patient's Chart, lab work & pertinent test results, reviewed documented beta blocker date and time   Airway Mallampati: II  TM Distance: >3 FB Neck ROM: full    Dental no notable dental hx.    Pulmonary pneumonia, former smoker,    Pulmonary exam normal breath sounds clear to auscultation       Cardiovascular Exercise Tolerance: Good hypertension, negative cardio ROS   Rhythm:regular Rate:Normal     Neuro/Psych negative neurological ROS  negative psych ROS   GI/Hepatic Neg liver ROS, GERD  Medicated,  Endo/Other  negative endocrine ROSdiabetes, Type 2  Renal/GU negative Renal ROS  negative genitourinary   Musculoskeletal   Abdominal   Peds  Hematology negative hematology ROS (+)   Anesthesia Other Findings   Reproductive/Obstetrics negative OB ROS                             Anesthesia Physical Anesthesia Plan  ASA: III  Anesthesia Plan: General   Post-op Pain Management:    Induction:   PONV Risk Score and Plan: Propofol infusion  Airway Management Planned:   Additional Equipment:   Intra-op Plan:   Post-operative Plan:   Informed Consent: I have reviewed the patients History and Physical, chart, labs and discussed the procedure including the risks, benefits and alternatives for the proposed anesthesia with the patient or authorized representative who has indicated his/her understanding and acceptance.     Dental Advisory Given  Plan Discussed with: CRNA  Anesthesia Plan Comments:         Anesthesia Quick Evaluation

## 2020-07-09 NOTE — Discharge Instructions (Signed)
Electrical Cardioversion Electrical cardioversion is the delivery of a jolt of electricity to restore a normal rhythm to the heart. A rhythm that is too fast or is not regular keeps the heart from pumping well. In this procedure, sticky patches or metal paddles are placed on the chest to deliver electricity to the heart from a device. This procedure may be done in an emergency if:  There is low or no blood pressure as a result of the heart rhythm.  Normal rhythm must be restored as fast as possible to protect the brain and heart from further damage.  It may save a life. This may also be a scheduled procedure for irregular or fast heart rhythms that are not immediately life-threatening. Tell a health care provider about:  Any allergies you have.  All medicines you are taking, including vitamins, herbs, eye drops, creams, and over-the-counter medicines.  Any problems you or family members have had with anesthetic medicines.  Any blood disorders you have.  Any surgeries you have had.  Any medical conditions you have.  Whether you are pregnant or may be pregnant. What are the risks? Generally, this is a safe procedure. However, problems may occur, including:  Allergic reactions to medicines.  A blood clot that breaks free and travels to other parts of your body.  The possible return of an abnormal heart rhythm within hours or days after the procedure.  Your heart stopping (cardiac arrest). This is rare. What happens before the procedure? Medicines  Your health care provider may have you start taking: ? Blood-thinning medicines (anticoagulants) so your blood does not clot as easily. ? Medicines to help stabilize your heart rate and rhythm.  Ask your health care provider about: ? Changing or stopping your regular medicines. This is especially important if you are taking diabetes medicines or blood thinners. ? Taking medicines such as aspirin and ibuprofen. These medicines can  thin your blood. Do not take these medicines unless your health care provider tells you to take them. ? Taking over-the-counter medicines, vitamins, herbs, and supplements. General instructions  Follow instructions from your health care provider about eating or drinking restrictions.  Plan to have someone take you home from the hospital or clinic.  If you will be going home right after the procedure, plan to have someone with you for 24 hours.  Ask your health care provider what steps will be taken to help prevent infection. These may include washing your skin with a germ-killing soap. What happens during the procedure?  An IV will be inserted into one of your veins.  Sticky patches (electrodes) or metal paddles may be placed on your chest.  You will be given a medicine to help you relax (sedative).  An electrical shock will be delivered. The procedure may vary among health care providers and hospitals.   What can I expect after the procedure?  Your blood pressure, heart rate, breathing rate, and blood oxygen level will be monitored until you leave the hospital or clinic.  Your heart rhythm will be watched to make sure it does not change.  You may have some redness on the skin where the shocks were given. Follow these instructions at home:  Do not drive for 24 hours if you were given a sedative during your procedure.  Take over-the-counter and prescription medicines only as told by your health care provider.  Ask your health care provider how to check your pulse. Check it often.  Rest for 48 hours after the procedure   or as told by your health care provider.  Avoid or limit your caffeine use as told by your health care provider.  Keep all follow-up visits as told by your health care provider. This is important. Contact a health care provider if:  You feel like your heart is beating too quickly or your pulse is not regular.  You have a serious muscle cramp that does not go  away. Get help right away if:  You have discomfort in your chest.  You are dizzy or you feel faint.  You have trouble breathing or you are short of breath.  Your speech is slurred.  You have trouble moving an arm or leg on one side of your body.  Your fingers or toes turn cold or blue. Summary  Electrical cardioversion is the delivery of a jolt of electricity to restore a normal rhythm to the heart.  This procedure may be done right away in an emergency or may be a scheduled procedure if the condition is not an emergency.  Generally, this is a safe procedure.  After the procedure, check your pulse often as told by your health care provider. This information is not intended to replace advice given to you by your health care provider. Make sure you discuss any questions you have with your health care provider. Document Revised: 09/25/2018 Document Reviewed: 09/25/2018 Elsevier Patient Education  2021 Elsevier Inc.         Monitored Anesthesia Care, Care After This sheet gives you information about how to care for yourself after your procedure. Your health care provider may also give you more specific instructions. If you have problems or questions, contact your health care provider. What can I expect after the procedure? After the procedure, it is common to have:  Tiredness.  Forgetfulness about what happened after the procedure.  Impaired judgment for important decisions.  Nausea or vomiting.  Some difficulty with balance. Follow these instructions at home: For the time period you were told by your health care provider:  Rest as needed.  Do not participate in activities where you could fall or become injured.  Do not drive or use machinery.  Do not drink alcohol.  Do not take sleeping pills or medicines that cause drowsiness.  Do not make important decisions or sign legal documents.  Do not take care of children on your own.      Eating and  drinking  Follow the diet that is recommended by your health care provider.  Drink enough fluid to keep your urine pale yellow.  If you vomit: ? Drink water, juice, or soup when you can drink without vomiting. ? Make sure you have little or no nausea before eating solid foods. General instructions  Have a responsible adult stay with you for the time you are told. It is important to have someone help care for you until you are awake and alert.  Take over-the-counter and prescription medicines only as told by your health care provider.  If you have sleep apnea, surgery and certain medicines can increase your risk for breathing problems. Follow instructions from your health care provider about wearing your sleep device: ? Anytime you are sleeping, including during daytime naps. ? While taking prescription pain medicines, sleeping medicines, or medicines that make you drowsy.  Avoid smoking.  Keep all follow-up visits as told by your health care provider. This is important. Contact a health care provider if:  You keep feeling nauseous or you keep vomiting.  You feel   light-headed.  You are still sleepy or having trouble with balance after 24 hours.  You develop a rash.  You have a fever.  You have redness or swelling around the IV site. Get help right away if:  You have trouble breathing.  You have new-onset confusion at home. Summary  For several hours after your procedure, you may feel tired. You may also be forgetful and have poor judgment.  Have a responsible adult stay with you for the time you are told. It is important to have someone help care for you until you are awake and alert.  Rest as told. Do not drive or operate machinery. Do not drink alcohol or take sleeping pills.  Get help right away if you have trouble breathing, or if you suddenly become confused. This information is not intended to replace advice given to you by your health care provider. Make sure  you discuss any questions you have with your health care provider. Document Revised: 11/08/2019 Document Reviewed: 01/25/2019 Elsevier Patient Education  2021 Elsevier Inc.     

## 2020-07-09 NOTE — Transfer of Care (Signed)
Immediate Anesthesia Transfer of Care Note  Patient: Kevin Goodman  Procedure(s) Performed: CARDIOVERSION (N/A )  Patient Location: PACU  Anesthesia Type:MAC  Level of Consciousness: awake, alert , oriented and patient cooperative  Airway & Oxygen Therapy: Patient Spontanous Breathing and Patient connected to nasal cannula oxygen  Post-op Assessment: Report given to RN, Post -op Vital signs reviewed and stable and Patient moving all extremities X 4  Post vital signs: Reviewed and stable  Last Vitals:  Vitals Value Taken Time  BP    Temp    Pulse    Resp    SpO2      Last Pain:  Vitals:   07/09/20 0844  TempSrc: Oral  PainSc: 0-No pain      Patients Stated Pain Goal: 8 (03/47/42 5956)  Complications: No complications documented.

## 2020-07-10 ENCOUNTER — Encounter (HOSPITAL_COMMUNITY): Payer: Self-pay | Admitting: Cardiology

## 2020-08-01 ENCOUNTER — Ambulatory Visit (INDEPENDENT_AMBULATORY_CARE_PROVIDER_SITE_OTHER): Payer: Medicare PPO | Admitting: Student

## 2020-08-01 ENCOUNTER — Encounter (INDEPENDENT_AMBULATORY_CARE_PROVIDER_SITE_OTHER): Payer: Self-pay

## 2020-08-01 ENCOUNTER — Other Ambulatory Visit: Payer: Self-pay

## 2020-08-01 ENCOUNTER — Encounter: Payer: Self-pay | Admitting: Student

## 2020-08-01 VITALS — BP 182/104 | HR 92 | Ht 73.0 in | Wt 386.0 lb

## 2020-08-01 DIAGNOSIS — G4733 Obstructive sleep apnea (adult) (pediatric): Secondary | ICD-10-CM

## 2020-08-01 DIAGNOSIS — I4892 Unspecified atrial flutter: Secondary | ICD-10-CM | POA: Diagnosis not present

## 2020-08-01 DIAGNOSIS — R600 Localized edema: Secondary | ICD-10-CM

## 2020-08-01 DIAGNOSIS — Z79899 Other long term (current) drug therapy: Secondary | ICD-10-CM | POA: Diagnosis not present

## 2020-08-01 DIAGNOSIS — I10 Essential (primary) hypertension: Secondary | ICD-10-CM | POA: Diagnosis not present

## 2020-08-01 MED ORDER — LOSARTAN POTASSIUM 25 MG PO TABS: 25.0000 mg | ORAL_TABLET | Freq: Every day | ORAL | 3 refills | Status: DC

## 2020-08-01 NOTE — Patient Instructions (Signed)
Medication Instructions:  Your physician has recommended you make the following change in your medication:  START Losartan 25 mg tablets daily  *If you need a refill on your cardiac medications before your next appointment, please call your pharmacy*   Lab Work: BMET- 2 weeks If you have labs (blood work) drawn today and your tests are completely normal, you will receive your results only by: Marland Kitchen MyChart Message (if you have MyChart) OR . A paper copy in the mail If you have any lab test that is abnormal or we need to change your treatment, we will call you to review the results.   Testing/Procedures: None   Follow-Up: At [facility], you and your health needs are our priority.  As part of our continuing mission to provide you with exceptional heart care, we have created designated Provider Care Teams.  These Care Teams include your primary Cardiologist (physician) and Advanced Practice Providers (APPs -  Physician Assistants and Nurse Practitioners) who all work together to provide you with the care you need, when you need it.  We recommend signing up for the patient portal called "MyChart".  Sign up information is provided on this After Visit Summary.  MyChart is used to connect with patients for Virtual Visits (Telemedicine).  Patients are able to view lab/test results, encounter notes, upcoming appointments, etc.  Non-urgent messages can be sent to your provider as well.   To learn more about what you can do with MyChart, go to [email or web address].    Your next appointment:   3 month(s)  The format for your next appointment:   In Person  Provider:   Bernerd Pho, PA-C   Other Instructions Nurse Visit in  2 weeks for heartrate and blood pressure check.

## 2020-08-01 NOTE — Progress Notes (Signed)
Cardiology Office Note    Date:  08/01/2020   ID:  Kevin Goodman, DOB 22-Feb-1951, MRN [id number]  PCP:  Lucia Gaskins, MD  Cardiologist: Carlyle Dolly, MD    Chief Complaint  Patient presents with  . Follow-up    S/p DCCV    History of Present Illness:    Kevin Goodman is a 70 y.o. male with past medical history of paroxysmal atrial fibrillation/flutter (initialy occurring in 2013 with recurrence in 06/2020 in the setting of COVID-19), HTN, Type 2 DM, Stage 3 CKD and OSA (not on CPAP)who presents to the office today for follow-up from his recent cardioversion.  He was last examined myself in 06/2020 for follow-up from his recent admission during which he had been diagnosed with COVID-19 but also with recurrent atrial flutter with RVR. He had been started on anticoagulation during admission along with Cardizem CD and Lopressor for rate control. His heart rate was still in the 150's during his office visit but he had not yet taken his medications that day. A DCCV was recommended for further management.  This was performed by Dr. Harl Bowie on 07/09/2020 and he successfully converted from atrial flutter to NSR with a single synchronized 200 J shock. He was continued on Cardizem CD and Eliquis with Lopressor being discontinued.  In talking with the patient today, he reports doing well since his recent cardioversion.  He denies any recent chest pain or palpitations. He does have baseline dyspnea on exertion but reports his respiratory status has improved. He is utilizing an exercise bike at least 20 minutes a day and denies any anginal symptoms with this. No reported orthopnea or PND. He does have chronic pitting edema.  Blood pressure was initially elevated at 182/104, rechecked and at 184/72. He does not have a blood pressure cuff at home.  Past Medical History:  Diagnosis Date  . Atrial fibrillation ([place])   . Diabetes mellitus without complication ([place])   . GERD  (gastroesophageal reflux disease)   . Gout   . Hypertension     Past Surgical History:  Procedure Laterality Date  . CARDIOVERSION N/A 07/09/2020   Procedure: CARDIOVERSION;  Surgeon: Arnoldo Lenis, MD;  Location: [facility];  Service: Endoscopy;  Laterality: N/A;  . COLONOSCOPY N/A 12/08/2016   sigmoid and descending colon diverticula, four semi-pedunculated polyps in descending and ascendign colon, few polyps in descending and ascending colon (tubular adenomas). 3 year surveillance.   . ESOPHAGOGASTRODUODENOSCOPY N/A 12/08/2016   normal esophagus s/p empiric dilation, small hiatal hernia.   Marland Kitchen FINGER SURGERY     left ring  . INCISION AND DRAINAGE OF WOUND Left 04/20/2016   Procedure: LEFT THIGH DEBRIDEMENT;  Surgeon: Mickeal Skinner, MD;  Location: [facility];  Service: General;  Laterality: Left;  Marland Kitchen MALONEY DILATION N/A 12/08/2016   Procedure: Venia Minks DILATION;  Surgeon: Daneil Dolin, MD;  Location: [facility];  Service: Endoscopy;  Laterality: N/A;  . POLYPECTOMY  12/08/2016   Procedure: POLYPECTOMY;  Surgeon: Daneil Dolin, MD;  Location: [facility];  Service: Endoscopy;;  colon    Current Medications: Outpatient Medications Prior to Visit  Medication Sig Dispense Refill  . apixaban (ELIQUIS) 5 MG TABS tablet Take 1 tablet (5 mg total) by mouth 2 (two) times daily. 60 tablet 1  . diltiazem (CARDIZEM CD) 360 MG 24 hr capsule Take 1 capsule (360 mg total) by mouth daily. 30 capsule 1  . furosemide (LASIX) 20 MG tablet Take 1 tablet (20  mg total) by mouth daily. 90 tablet 3  . metFORMIN (GLUCOPHAGE) 500 MG tablet Take 500 mg by mouth 2 (two) times daily with a meal.    . Multiple Vitamins-Minerals (MULTIVITAMIN WITH MINERALS) tablet Take 1 tablet by mouth daily.    . naproxen (NAPROSYN) 500 MG tablet Take 500 mg by mouth 2 (two) times daily with a meal.    . oxymetazoline (AFRIN) 0.05 % nasal spray Place 1 spray into both nostrils 2 (two) times daily as needed for congestion.     . pantoprazole (PROTONIX) 40 MG tablet Take 1 tablet (40 mg total) by mouth daily. 30 minutes before breakfast 90 tablet 3  . TRESIBA FLEXTOUCH 100 UNIT/ML FlexTouch Pen Inject 30 Units into the skin at bedtime.    . vitamin C (ASCORBIC ACID) 500 MG tablet Take 500 mg by mouth daily.     No facility-administered medications prior to visit.     Allergies:   Patient has no known allergies.   Social History   Socioeconomic History  . Marital status: Widowed    Spouse name: Not on file  . Number of children: Not on file  . Years of education: Not on file  . Highest education level: Not on file  Occupational History  . Occupation: Public house manager: [employer]  Tobacco Use  . Smoking status: Former Smoker    Years: 10.00    Types: Cigars    Quit date: 07/08/2018    Years since quitting: 2.0  . Smokeless tobacco: Never Used  . Tobacco comment: smoked 2-3 cigars daily  Vaping Use  . Vaping Use: Never used  Substance and Sexual Activity  . Alcohol use: Yes    Comment: occasional shot of liquour every now and then   . Drug use: No  . Sexual activity: Yes  Other Topics Concern  . Not on file  Social History Narrative  . Not on file   Social Determinants of Health   Financial Resource Strain: Not on file  Food Insecurity: Not on file  Transportation Needs: Not on file  Physical Activity: Not on file  Stress: Not on file  Social Connections: Not on file     Family History:  The patient's family history includes Heart failure in his mother; Hypertension in his father and mother; Prostate cancer in his brother and father; Stroke in his brother and father.   Review of Systems:    Please see the history of present illness.     All other systems reviewed and are otherwise negative except as noted above.   Physical Exam:    VS:  BP (!) 182/104   Pulse 92   Ht 6\' 1"  (1.854 m)   Wt (!) 386 lb (175.1 kg)   SpO2 97%   BMI 50.93 kg/m    General: Pleasant, obese  male appearing in no acute distress. Head: Normocephalic, atraumatic. Neck: No carotid bruits. JVD not elevated.  Lungs: Respirations regular and unlabored, without wheezes or rales.  Heart: Regular rate and rhythm. No S3 or S4.  No murmur, no rubs, or gallops appreciated. Abdomen: Appears non-distended. No obvious abdominal masses. Msk:  Strength and tone appear normal for age. No obvious joint deformities or effusions. Extremities: No clubbing or cyanosis. Chronic appearing edema, most notable along LLE.  Distal pedal pulses are 2+ bilaterally. Neuro: Alert and oriented X 3. Moves all extremities spontaneously. No focal deficits noted. Psych:  Responds to questions appropriately with a normal affect. Skin:  No rashes or lesions noted  Wt Readings from Last 3 Encounters:  08/01/20 (!) 386 lb (175.1 kg)  07/09/20 (!) 380 lb 1.2 oz (172.4 kg)  07/07/20 (!) 380 lb (172.4 kg)     Studies/Labs Reviewed:   EKG:  EKG is ordered today. The EKG ordered today demonstrates NSR, HR 88 with incomplete RBBB and borderline RAD.   Recent Labs: 06/10/2020: B Natriuretic Peptide 410.0; TSH 4.010 06/11/2020: Magnesium 2.0 06/14/2020: ALT 47 07/07/2020: BUN 19; Creatinine, Ser 1.36; Hemoglobin 13.2; Platelets 398; Potassium 4.1; Sodium 137   Lipid Panel    Component Value Date/Time   CHOL 160 07/19/2011 0458   TRIG 139 07/19/2011 0458   HDL 38 (L) 07/19/2011 0458   CHOLHDL 4.2 07/19/2011 0458   VLDL 28 07/19/2011 0458   LDLCALC 94 07/19/2011 0458    Additional studies/ records that were reviewed today include:   Echocardiogram: 06/11/2020 IMPRESSIONS    1. Left ventricular ejection fraction, by estimation, is 55 to 60%. The  left ventricle has normal function. The left ventricle has no regional  wall motion abnormalities. There is moderate left ventricular hypertrophy.  Left ventricular diastolic  parameters are indeterminate.  2. Right ventricular systolic function is normal. The right  ventricular  size is normal.  3. The mitral valve is normal in structure. No evidence of mitral valve  regurgitation. No evidence of mitral stenosis.  4. The aortic valve is tricuspid. There is mild calcification of the  aortic valve. There is mild thickening of the aortic valve. Aortic valve  regurgitation is not visualized. No aortic stenosis is present.  5. Mild pulmonary HTN, PASP is 31 mmHg.  6. The inferior vena cava is dilated in size with >50% respiratory  variability, suggesting right atrial pressure of 8 mmHg.   Assessment:    1. Paroxysmal atrial flutter ([place])   2. Essential hypertension   3. Medication management   4. Bilateral lower extremity edema   5. OSA (obstructive sleep apnea)      Plan:   In order of problems listed above:  1. Persistent Atrial Flutter - He underwent successful DCCV on 07/09/2020 and is maintaining NSR by examination and EKG today. Continue Cardizem CD 360mg  daily for rate-control.  - He denies any evidence of active bleeding. Continue Eliquis 5mg  BID for anticoagulation.   2. HTN - His BP is significantly elevated as outlined above. Currently on Cardizem CD 360mg  daily (was previously on Amlodipine prior to admission but would not restart due to both being CCB). Will start Losartan 25mg  daily. Repeat BMET in 2 weeks. Will arrange for a nurse visit in 2-3 weeks for HR/BP check as he will likely require further titration of Losartan.   3. Lower Extremity Edema - He has chronic appearing edema, most notable along his LLE and has been present for years following a prior surgery per his report. Remains on Lasix 20mg  daily. Creatinine was stable at 1.36 by recent labs earlier this month which is close to his baseline (1.3 - 1.4).   4. OSA - He was previously intolerant to CPAP due to claustrophobia and has not been keen on undergoing a repeat sleep study. He wishes to focus on weight loss and increased exercise at this time.    Medication  Adjustments/Labs and Tests Ordered: Current medicines are reviewed at length with the patient today.  Concerns regarding medicines are outlined above.  Medication changes, Labs and Tests ordered today are listed in the Patient Instructions below. Patient Instructions  Medication Instructions:  Your physician has recommended you make the following change in your medication:  START Losartan 25 mg tablets daily  *If you need a refill on your cardiac medications before your next appointment, please call your pharmacy*   Lab Work: BMET- 2 weeks If you have labs (blood work) drawn today and your tests are completely normal, you will receive your results only by: Marland Kitchen MyChart Message (if you have MyChart) OR . A paper copy in the mail If you have any lab test that is abnormal or we need to change your treatment, we will call you to review the results.   Testing/Procedures: None   Follow-Up: At [facility], you and your health needs are our priority.  As part of our continuing mission to provide you with exceptional heart care, we have created designated Provider Care Teams.  These Care Teams include your primary Cardiologist (physician) and Advanced Practice Providers (APPs -  Physician Assistants and Nurse Practitioners) who all work together to provide you with the care you need, when you need it.  We recommend signing up for the patient portal called "MyChart".  Sign up information is provided on this After Visit Summary.  MyChart is used to connect with patients for Virtual Visits (Telemedicine).  Patients are able to view lab/test results, encounter notes, upcoming appointments, etc.  Non-urgent messages can be sent to your provider as well.   To learn more about what you can do with MyChart, go to [email or web address].    Your next appointment:   3 month(s)  The format for your next appointment:   In Person  Provider:   Bernerd Pho, PA-C   Other Instructions Nurse Visit  in  2 weeks for heartrate and blood pressure check.      Signed, Erma Heritage, PA-C  08/01/2020 8:21 PM    [facility] [address] [place],  [address] Phone: [phone] Fax: [phone]

## 2020-08-05 ENCOUNTER — Ambulatory Visit: Payer: Medicare PPO | Admitting: Gastroenterology

## 2020-08-15 ENCOUNTER — Telehealth: Payer: Self-pay

## 2020-08-15 ENCOUNTER — Other Ambulatory Visit: Payer: Self-pay

## 2020-08-15 ENCOUNTER — Ambulatory Visit (INDEPENDENT_AMBULATORY_CARE_PROVIDER_SITE_OTHER): Payer: Medicare PPO

## 2020-08-15 ENCOUNTER — Other Ambulatory Visit (HOSPITAL_COMMUNITY)
Admission: RE | Admit: 2020-08-15 | Discharge: 2020-08-15 | Disposition: A | Discharge status: Home / Self Care | Payer: Medicare PPO | Source: Ambulatory Visit | Attending: Student | Admitting: Student

## 2020-08-15 VITALS — BP 196/92 | HR 86

## 2020-08-15 DIAGNOSIS — I10 Essential (primary) hypertension: Secondary | ICD-10-CM

## 2020-08-15 DIAGNOSIS — Z79899 Other long term (current) drug therapy: Secondary | ICD-10-CM | POA: Insufficient documentation

## 2020-08-15 LAB — BASIC METABOLIC PANEL
Anion gap: 8 (ref 5–15)
BUN: 20 mg/dL (ref 8–23)
CO2: 27 mmol/L (ref 22–32)
Calcium: 9.6 mg/dL (ref 8.9–10.3)
Chloride: 104 mmol/L (ref 98–111)
Creatinine, Ser: 1.32 mg/dL — ABNORMAL HIGH (ref 0.61–1.24)
GFR, Estimated: 59 mL/min — ABNORMAL LOW (ref >60)
Glucose, Bld: 214 mg/dL — ABNORMAL HIGH (ref 70–99)
Potassium: 4.4 mmol/L (ref 3.5–5.1)
Sodium: 139 mmol/L (ref 135–145)

## 2020-08-15 MED ORDER — LOSARTAN POTASSIUM 50 MG PO TABS: 50.0000 mg | ORAL_TABLET | Freq: Every day | ORAL | 3 refills | Status: DC

## 2020-08-15 NOTE — Progress Notes (Signed)
Pt presented today for a bp/hr check. Pt was cardioverted on 07/09/20, with a f/u on 08/01/20. Pt stated that he feels better. Pt also stated that his bp would be high because he forgot to take his medication this morning.

## 2020-08-15 NOTE — Telephone Encounter (Signed)
Pt notified and verbalized understanding. Pt medication list updated to reflect medication changes. Pt to come in for nursing visit in 2 weeks- am d/t working evenings

## 2020-08-15 NOTE — Telephone Encounter (Signed)
-----   Message from Erma Heritage, [place] sent at 08/15/2020 12:30 PM EDT ----- Please let the patient know his electrolytes remain within a normal range and kidney function remains stable and has actually improved when compared to prior values.  Would recommend titration of Losartan to 50 mg daily (can take 2 of his 25mg  tablets to use them up). Would see if he can come for another nurse visit in 2 weeks for repeat BP check.

## 2020-08-29 ENCOUNTER — Ambulatory Visit: Payer: Medicare PPO

## 2020-08-29 ENCOUNTER — Other Ambulatory Visit: Payer: Self-pay

## 2020-08-29 VITALS — BP 162/86 | HR 92 | Ht 73.0 in | Wt 385.0 lb

## 2020-08-29 DIAGNOSIS — Z0130 Encounter for examination of blood pressure without abnormal findings: Secondary | ICD-10-CM | POA: Diagnosis not present

## 2020-08-29 MED ORDER — LOSARTAN POTASSIUM 100 MG PO TABS: 100.0000 mg | ORAL_TABLET | Freq: Every day | ORAL | 3 refills | Status: DC

## 2020-08-29 NOTE — Patient Instructions (Signed)
INCREASE Losartan to 100 mg daily     Follow up on 09/19/20 as planned.       Thank you for choosing [facility] !

## 2020-08-29 NOTE — Progress Notes (Signed)
BP check today after losartan increased to 50 mg daily.   BP 162/86   Discussed with B.Strader, PA-C, increase Losartan to 100 mg daily and keep 09/19/20 appointment.

## 2020-09-19 ENCOUNTER — Ambulatory Visit: Payer: Medicare PPO | Admitting: Student

## 2020-09-19 ENCOUNTER — Encounter: Payer: Self-pay | Admitting: Student

## 2020-09-19 ENCOUNTER — Other Ambulatory Visit: Payer: Self-pay

## 2020-09-19 VITALS — BP 130/80 | HR 96 | Ht 73.0 in | Wt 378.4 lb

## 2020-09-19 DIAGNOSIS — R600 Localized edema: Secondary | ICD-10-CM | POA: Diagnosis not present

## 2020-09-19 DIAGNOSIS — I10 Essential (primary) hypertension: Secondary | ICD-10-CM | POA: Diagnosis not present

## 2020-09-19 DIAGNOSIS — I4892 Unspecified atrial flutter: Secondary | ICD-10-CM | POA: Diagnosis not present

## 2020-09-19 DIAGNOSIS — Z79899 Other long term (current) drug therapy: Secondary | ICD-10-CM

## 2020-09-19 DIAGNOSIS — G4733 Obstructive sleep apnea (adult) (pediatric): Secondary | ICD-10-CM

## 2020-09-19 NOTE — Progress Notes (Signed)
Cardiology Office Note    Date:  09/19/2020   ID:  Kevin Goodman, DOB Jan 21, 1951, MRN [id number]  PCP:  Pcp, No  Cardiologist: Carlyle Dolly, MD    Chief Complaint  Patient presents with   Follow-up    2 month visit    History of Present Illness:    Kevin Goodman is a 70 y.o. male with past medical history of paroxysmal atrial fibrillation/flutter (initialy occurring in 2013 with recurrence in 06/2020 in the setting of COVID-19 --> s/p DCCV in 07/2020), HTN, Type 2 DM, Stage 3 CKD and OSA (not on CPAP) who presents to the office today for 20-month follow-up.  He was last examined by myself in 07/2020 for follow-up from his recent cardioversion and denied any recurrent chest pain or palpitations. He did have baseline dyspnea on exertion but reported improvement in his breathing and was trying to utilize his exercise bike for 20 minutes a day. BP was significantly elevated at 184/72 and he was started on Losartan 25 mg daily. A nurse visit was arranged for 2 weeks and BP remained elevated, therefore Losartan was titrated further to 50 mg daily. His BP was at 162/86 on 08/29/2020 and Losartan was further increased to 100 mg daily.  In talking with the patient today, he reports his breathing continues to improve and he has tried to focus on dietary changes for weight loss. He denies any specific orthopnea, PND or pitting edema. No recent chest pain or tachy-palpitations. He does report feeling "odd" in the mornings prior to taking his medications. He does not have a blood pressure cuff at home to check his BP at that time.  Past Medical History:  Diagnosis Date   Atrial fibrillation ([place])    a. initialy occurring in 2013  b. recurrence in 06/2020 in the setting of COVID-19 --> s/p DCCV in 07/2020   Diabetes mellitus without complication ([place])    GERD (gastroesophageal reflux disease)    Gout    Hypertension     Past Surgical History:  Procedure Laterality Date   CARDIOVERSION  N/A 07/09/2020   Procedure: CARDIOVERSION;  Surgeon: Arnoldo Lenis, MD;  Location: [facility];  Service: Endoscopy;  Laterality: N/A;   COLONOSCOPY N/A 12/08/2016   sigmoid and descending colon diverticula, four semi-pedunculated polyps in descending and ascendign colon, few polyps in descending and ascending colon (tubular adenomas). 3 year surveillance.    ESOPHAGOGASTRODUODENOSCOPY N/A 12/08/2016   normal esophagus s/p empiric dilation, small hiatal hernia.    FINGER SURGERY     left ring   INCISION AND DRAINAGE OF WOUND Left 04/20/2016   Procedure: LEFT THIGH DEBRIDEMENT;  Surgeon: Mickeal Skinner, MD;  Location: [facility];  Service: General;  Laterality: Left;   MALONEY DILATION N/A 12/08/2016   Procedure: Keturah Shavers;  Surgeon: Daneil Dolin, MD;  Location: [facility];  Service: Endoscopy;  Laterality: N/A;   POLYPECTOMY  12/08/2016   Procedure: POLYPECTOMY;  Surgeon: Daneil Dolin, MD;  Location: [facility];  Service: Endoscopy;;  colon    Current Medications: Outpatient Medications Prior to Visit  Medication Sig Dispense Refill   apixaban (ELIQUIS) 5 MG TABS tablet Take 1 tablet (5 mg total) by mouth 2 (two) times daily. 60 tablet 1   dapagliflozin propanediol (FARXIGA) 10 MG TABS tablet Take 10 mg by mouth daily.     diltiazem (CARDIZEM CD) 360 MG 24 hr capsule Take 1 capsule (360 mg total) by mouth daily. 30 capsule 1  furosemide (LASIX) 20 MG tablet Take 1 tablet (20 mg total) by mouth daily. 90 tablet 3   losartan (COZAAR) 100 MG tablet Take 1 tablet (100 mg total) by mouth daily. 90 tablet 3   metFORMIN (GLUCOPHAGE) 500 MG tablet Take 500 mg by mouth 2 (two) times daily with a meal.     Multiple Vitamins-Minerals (MULTIVITAMIN WITH MINERALS) tablet Take 1 tablet by mouth daily.     naproxen (NAPROSYN) 500 MG tablet Take 500 mg by mouth 2 (two) times daily with a meal.     oxymetazoline (AFRIN) 0.05 % nasal spray Place 1 spray into both nostrils 2 (two) times daily  as needed for congestion.     pantoprazole (PROTONIX) 40 MG tablet Take 1 tablet (40 mg total) by mouth daily. 30 minutes before breakfast 90 tablet 3   TRESIBA FLEXTOUCH 100 UNIT/ML FlexTouch Pen Inject 30 Units into the skin at bedtime.     vitamin C (ASCORBIC ACID) 500 MG tablet Take 500 mg by mouth daily.     No facility-administered medications prior to visit.     Allergies:   Patient has no known allergies.   Social History   Socioeconomic History   Marital status: Widowed    Spouse name: Not on file   Number of children: Not on file   Years of education: Not on file   Highest education level: Not on file  Occupational History   Occupation: Security    Employer: [employer]  Tobacco Use   Smoking status: Former    Types: Cigars    Quit date: 07/08/2018    Years since quitting: 2.2   Smokeless tobacco: Never   Tobacco comments:    smoked 2-3 cigars daily  Vaping Use   Vaping Use: Never used  Substance and Sexual Activity   Alcohol use: Yes    Comment: occasional shot of liquour every now and then    Drug use: No   Sexual activity: Yes  Other Topics Concern   Not on file  Social History Narrative   Not on file   Social Determinants of Health   Financial Resource Strain: Not on file  Food Insecurity: Not on file  Transportation Needs: Not on file  Physical Activity: Not on file  Stress: Not on file  Social Connections: Not on file     Family History:  The patient's family history includes Heart failure in his mother; Hypertension in his father and mother; Prostate cancer in his brother and father; Stroke in his brother and father.   Review of Systems:    Please see the history of present illness.     All other systems reviewed and are otherwise negative except as noted above.   Physical Exam:    VS:  BP 130/80   Pulse 96   Ht 6\' 1"  (1.854 m)   Wt (!) 378 lb 6.4 oz (171.6 kg)   SpO2 97%   BMI 49.92 kg/m    General: Pleasant male appearing in  no acute distress. Head: Normocephalic, atraumatic. Neck: No carotid bruits. JVD not elevated.  Lungs: Respirations regular and unlabored, without wheezes or rales.  Heart: Regular rate and rhythm. No S3 or S4.  No murmur, no rubs, or gallops appreciated. Abdomen: Appears non-distended. No obvious abdominal masses. Msk:  Strength and tone appear normal for age. No obvious joint deformities or effusions. Extremities: No clubbing or cyanosis. Chronic appearing edema.  Distal pedal pulses are 2+ bilaterally. Neuro: Alert and oriented X  3. Moves all extremities spontaneously. No focal deficits noted. Psych:  Responds to questions appropriately with a normal affect. Skin: No rashes or lesions noted  Wt Readings from Last 3 Encounters:  09/19/20 (!) 378 lb 6.4 oz (171.6 kg)  08/29/20 (!) 385 lb (174.6 kg)  08/01/20 (!) 386 lb (175.1 kg)      Studies/Labs Reviewed:   EKG:  EKG is not ordered today.   Recent Labs: 06/10/2020: B Natriuretic Peptide 410.0; TSH 4.010 06/11/2020: Magnesium 2.0 06/14/2020: ALT 47 07/07/2020: Hemoglobin 13.2; Platelets 398 08/15/2020: BUN 20; Creatinine, Ser 1.32; Potassium 4.4; Sodium 139   Lipid Panel    Component Value Date/Time   CHOL 160 07/19/2011 0458   TRIG 139 07/19/2011 0458   HDL 38 (L) 07/19/2011 0458   CHOLHDL 4.2 07/19/2011 0458   VLDL 28 07/19/2011 0458   LDLCALC 94 07/19/2011 0458    Additional studies/ records that were reviewed today include:   Echocardiogram: 06/2020 IMPRESSIONS     1. Left ventricular ejection fraction, by estimation, is 55 to 60%. The  left ventricle has normal function. The left ventricle has no regional  wall motion abnormalities. There is moderate left ventricular hypertrophy.  Left ventricular diastolic  parameters are indeterminate.   2. Right ventricular systolic function is normal. The right ventricular  size is normal.   3. The mitral valve is normal in structure. No evidence of mitral valve   regurgitation. No evidence of mitral stenosis.   4. The aortic valve is tricuspid. There is mild calcification of the  aortic valve. There is mild thickening of the aortic valve. Aortic valve  regurgitation is not visualized. No aortic stenosis is present.   5. Mild pulmonary HTN, PASP is 31 mmHg.   6. The inferior vena cava is dilated in size with >50% respiratory  variability, suggesting right atrial pressure of 8 mmHg.   Assessment:    1. Atrial flutter, unspecified type ([place])   2. Essential hypertension   3. Medication management   4. Bilateral lower extremity edema   5. OSA (obstructive sleep apnea)      Plan:   In order of problems listed above:  1. Persistent Atrial Flutter - This was initially diagnosed in 2013 and he experienced recurrence in 06/2020 while admitted for COVID-19 and underwent successful DCCV in 07/2020. He does experience brief palpitations but denies any persistent symptoms. His heart rate is well controlled in the 90's today. Continue Cardizem CD 360 mg daily for rate control and Eliquis 5 mg twice daily for anticoagulation.  2. HTN - His blood pressure has significantly improved since the time of his last office visit. Continue Cardizem CD 360 mg daily and Losartan 100 mg daily. Will recheck a BMET given recent dose adjustment of Losartan.   3. Lower Extremity Edema - He does feel like his symptoms have started to improve and remains on Lasix 20 mg daily.  Creatinine was stable at 1.32 when checked on 08/15/2020. Will recheck.   4. OSA - He has been intolerant to CPAP in the past due to claustrophobia and has not wish to undergo a repeat sleep study due to his concerns for this. Wishes to focus on weight loss at this time.    Medication Adjustments/Labs and Tests Ordered: Current medicines are reviewed at length with the patient today.  Concerns regarding medicines are outlined above.  Medication changes, Labs and Tests ordered today are listed in the  Patient Instructions below. Patient Instructions  Medication Instructions:  Your physician recommends that you continue on your current medications as directed. Please refer to the Current Medication list given to you today. ** Please take your Cardizem CD at night and take your Losartan in the morning.  *If you need a refill on your cardiac medications before your next appointment, please call your pharmacy*   Lab Work: BMET  If you have labs (blood work) drawn today and your tests are completely normal, you will receive your results only by: [place] (if you have MyChart) OR A paper copy in the mail If you have any lab test that is abnormal or we need to change your treatment, we will call you to review the results.   Testing/Procedures: None   Follow-Up: At [facility], you and your health needs are our priority.  As part of our continuing mission to provide you with exceptional heart care, we have created designated Provider Care Teams.  These Care Teams include your primary Cardiologist (physician) and Advanced Practice Providers (APPs -  Physician Assistants and Nurse Practitioners) who all work together to provide you with the care you need, when you need it.  We recommend signing up for the patient portal called "MyChart".  Sign up information is provided on this After Visit Summary.  MyChart is used to connect with patients for Virtual Visits (Telemedicine).  Patients are able to view lab/test results, encounter notes, upcoming appointments, etc.  Non-urgent messages can be sent to your provider as well.   To learn more about what you can do with MyChart, go to [email or web address].    Your next appointment:   6 month(s)  The format for your next appointment:   In Person  Provider:   Carlyle Dolly, MD or Bernerd Pho, PA-C   Other Instructions     Signed, Erma Heritage, PA-C  09/19/2020 5:18 PM    [facility] [address] [place], [place] [address] Phone: [phone] Fax: [phone]

## 2020-09-19 NOTE — Patient Instructions (Signed)
Medication Instructions:  Your physician recommends that you continue on your current medications as directed. Please refer to the Current Medication list given to you today. ** Please take your Cardizem CD at night and take your Losartan in the morning.  *If you need a refill on your cardiac medications before your next appointment, please call your pharmacy*   Lab Work: BMET  If you have labs (blood work) drawn today and your tests are completely normal, you will receive your results only by: [place] (if you have MyChart) OR A paper copy in the mail If you have any lab test that is abnormal or we need to change your treatment, we will call you to review the results.   Testing/Procedures: None   Follow-Up: At [facility], you and your health needs are our priority.  As part of our continuing mission to provide you with exceptional heart care, we have created designated Provider Care Teams.  These Care Teams include your primary Cardiologist (physician) and Advanced Practice Providers (APPs -  Physician Assistants and Nurse Practitioners) who all work together to provide you with the care you need, when you need it.  We recommend signing up for the patient portal called "MyChart".  Sign up information is provided on this After Visit Summary.  MyChart is used to connect with patients for Virtual Visits (Telemedicine).  Patients are able to view lab/test results, encounter notes, upcoming appointments, etc.  Non-urgent messages can be sent to your provider as well.   To learn more about what you can do with MyChart, go to [email or web address].    Your next appointment:   6 month(s)  The format for your next appointment:   In Person  Provider:   Carlyle Dolly, MD or Bernerd Pho, PA-C   Other Instructions

## 2020-10-24 ENCOUNTER — Telehealth: Payer: Self-pay | Admitting: Student

## 2020-10-24 MED ORDER — LOSARTAN POTASSIUM 100 MG PO TABS: 100.0000 mg | ORAL_TABLET | Freq: Every day | ORAL | 3 refills | Status: DC

## 2020-10-24 NOTE — Telephone Encounter (Signed)
New message     *STAT* If patient is at the pharmacy, call can be transferred to refill team.   1. Which medications need to be refilled? (please list name of each medication and dose if known) losartan losartan (COZAAR) 100 MG tablet  2. Which pharmacy/location (including street and city if local pharmacy) is medication to be sent to? [facility] - [address] [place]   3. Do they need a 30 day or 90 day supply? [place]

## 2020-10-24 NOTE — Telephone Encounter (Signed)
Medication refill request for Cozaar (Losartan) 100 mg tablets approved and sent to [facility], [place] per pt request.

## 2020-10-27 ENCOUNTER — Telehealth: Payer: Self-pay

## 2020-10-27 MED ORDER — LOSARTAN POTASSIUM 100 MG PO TABS: 100.0000 mg | ORAL_TABLET | Freq: Every day | ORAL | 3 refills | Status: AC

## 2020-10-27 NOTE — Telephone Encounter (Signed)
Medication refill request for Losartan 100 mg tablets approved and sent to [facility] [place].

## 2020-12-06 NOTE — H&P (View-Only) (Signed)
Referring Provider: Lucia Gaskins, MD Primary Care Physician:  Rosita Fire, MD Primary GI Physician: Dr. Abbey Chatters  Chief Complaint  Patient presents with   Dysphagia    Sometimes feels like things get hung in throat    HPI:   Kevin Goodman is a 70 y.o. male presenting today with a history of colonic adenomas overdue for 3-year surveillance with last colonoscopy in October 2018, history of GERD with empiric dilation in 2018.  History also significant for atrial fibrillation/atrial flutter s/p DCCV May 2022, maintained on Eliquis.  He is presenting today for follow-up on GERD, dysphagia, and to discuss scheduling EGD and TCS.    Last seen in our office 06/10/2020.  Reported he was being evaluated for tachycardia.  He had been off of his PPI and was having reflux as well as dysphagia.  His heart rate was 143-150 in our office, and he was sent to the emergency room.  He was also sent pantoprazole to resume and advised he would need surveillance colonoscopy and endoscopy in the near future after cardiac evaluation.  He was admitted 4/5-4/9 for atrial flutter.  He was started on rate control medications and Eliquis.  Echocardiogram during hospitalization with LVEF 55-60%, mild pulmonary hypertension.  Most recent follow-up with cardiology 09/19/2020.  Overall, he was doing well.  Reported feeling "odd" in the mornings prior to taking his medications.  Advised to continue Cardizem and Eliquis.   Today:  GERD: Well controlled on Protonix 40 mg daily.   Dysphagia: Intermittent sensation of foods getting hung in his upper chest. Solid foods and pills. No trouble with soft foods or liquids. Occasional regurgitation. Occurs once every couple of weeks. Dilation in 2018 helped. Started back a few months ago.   No abdominal pain, nausea, vomiting. Occasional constipation. Takes MOM as needed. BM every 2-3 days. Stools are hard. No brbpr or melena.   Elevated ALT: Mildly elevated ALT during  hospitalization in April, max 64, down to 47 on day of discharge.  No repeat LFTs at that time.  No abdominal imaging on file.   Occasional alcohol use, but nothing significant.  Occasional shot of liquor every now and then.  No history of IV or intranasal drug use.  No significant use of Tylenol or over-the-counter supplements.  Thinks he is vaccinated to hepatitis B.  HTN: BP elevated. Eating ham over the weekend. Asymptomatic. No CP, SOB, HA, or blurry vision. Hasn't taken Diltiazem yet today.    Taking Naproxen for his knees. Takes daily to twice daily.   Past Medical History:  Diagnosis Date   Atrial fibrillation ([place])    a. initialy occurring in 2013  b. recurrence in 06/2020 in the setting of COVID-19 --> s/p DCCV in 07/2020   Diabetes mellitus without complication ([place])    GERD (gastroesophageal reflux disease)    Gout    Hypertension     Past Surgical History:  Procedure Laterality Date   CARDIOVERSION N/A 07/09/2020   Procedure: CARDIOVERSION;  Surgeon: Arnoldo Lenis, MD;  Location: [facility];  Service: Endoscopy;  Laterality: N/A;   COLONOSCOPY N/A 12/08/2016   sigmoid and descending colon diverticula, four semi-pedunculated polyps in descending and ascendign colon, few polyps in descending and ascending colon (tubular adenomas). 3 year surveillance.    ESOPHAGOGASTRODUODENOSCOPY N/A 12/08/2016   normal esophagus s/p empiric dilation, small hiatal hernia.    FINGER SURGERY     left ring   INCISION AND DRAINAGE OF WOUND Left 04/20/2016  Procedure: LEFT THIGH DEBRIDEMENT;  Surgeon: Mickeal Skinner, MD;  Location: [place];  Service: General;  Laterality: Left;   MALONEY DILATION N/A 12/08/2016   Procedure: Keturah Shavers;  Surgeon: Daneil Dolin, MD;  [facility];  Service: Endoscopy;  Laterality: N/A;   POLYPECTOMY  12/08/2016   Procedure: POLYPECTOMY;  Surgeon: Daneil Dolin, MD;  [facility];  Service: Endoscopy;;  colon    Current Outpatient  Medications  Medication Sig Dispense Refill   apixaban (ELIQUIS) 5 MG TABS tablet Take 1 tablet (5 mg total) by mouth 2 (two) times daily. 60 tablet 1   atorvastatin (LIPITOR) 20 MG tablet Take 20 mg by mouth daily.     diltiazem (CARDIZEM CD) 360 MG 24 hr capsule Take 1 capsule (360 mg total) by mouth daily. 30 capsule 1   furosemide (LASIX) 20 MG tablet Take 1 tablet (20 mg total) by mouth daily. 90 tablet 3   hydrALAZINE (APRESOLINE) 50 MG tablet Take 50 mg by mouth 2 (two) times daily.     losartan (COZAAR) 100 MG tablet Take 1 tablet (100 mg total) by mouth daily. 90 tablet 3   metFORMIN (GLUCOPHAGE) 500 MG tablet Take 500 mg by mouth 2 (two) times daily with a meal.     Multiple Vitamins-Minerals (MULTIVITAMIN WITH MINERALS) tablet Take 1 tablet by mouth daily.     naproxen (NAPROSYN) 500 MG tablet Take 500 mg by mouth 2 (two) times daily as needed.     oxymetazoline (AFRIN) 0.05 % nasal spray Place 1 spray into both nostrils 2 (two) times daily as needed for congestion.     pantoprazole (PROTONIX) 40 MG tablet Take 1 tablet (40 mg total) by mouth daily. 30 minutes before breakfast 90 tablet 3   TRESIBA FLEXTOUCH 100 UNIT/ML FlexTouch Pen Inject 30 Units into the skin at bedtime. 40 units at bedtime     vitamin C (ASCORBIC ACID) 500 MG tablet Take 500 mg by mouth daily.     dapagliflozin propanediol (FARXIGA) 10 MG TABS tablet Take 10 mg by mouth daily. (Patient not taking: Reported on 12/08/2020)     No current facility-administered medications for this visit.    Allergies as of 12/08/2020   (No Known Allergies)    Family History  Problem Relation Age of Onset   Heart failure Mother    Hypertension Mother    Hypertension Father    Stroke Father    Prostate cancer Father    Stroke Brother    Prostate cancer Brother    Colon cancer Neg Hx    Colon polyps Neg Hx     Social History   Socioeconomic History   Marital status: Widowed    Spouse name: Not on file   Number of  children: Not on file   Years of education: Not on file   Highest education level: Not on file  Occupational History   Occupation: Security    Employer: [employer]  Tobacco Use   Smoking status: Some Days    Types: Cigars    Last attempt to quit: 07/08/2018    Years since quitting: 2.4   Smokeless tobacco: Never   Tobacco comments:    smoked 2-3 cigars daily  Vaping Use   Vaping Use: Never used  Substance and Sexual Activity   Alcohol use: Yes    Comment: occasional shot of liquour every now and then    Drug use: No   Sexual activity: Yes  Other  Topics Concern   Not on file  Social History Narrative   Not on file   Social Determinants of Health   Financial Resource Strain: Not on file  Food Insecurity: Not on file  Transportation Needs: Not on file  Physical Activity: Not on file  Stress: Not on file  Social Connections: Not on file    Review of Systems: Gen: Denies fever, chills, cold or flulike symptoms, presyncope, syncope. CV: Denies chest pain, palpitations. Resp: Denies dyspnea at rest.  Admits to some shortness of breath with exertion.  No routine cough. GI: See HPI  Heme: See HPI  Physical Exam: BP (!) 192/85   Pulse 79   Temp (!) 97.1 F (36.2 C) (Temporal)   Ht 6\' 1"  (1.854 m)   Wt (!) 383 lb 6.4 oz (173.9 kg)   BMI 50.58 kg/m  General:   Alert and oriented. No distress noted. Pleasant and cooperative.  Head:  Normocephalic and atraumatic. Eyes:  Conjuctiva clear without scleral icterus. Heart:  S1, S2 present without murmurs appreciated. Lungs:  Clear to auscultation bilaterally. No wheezes, rales, or rhonchi. No distress.  Abdomen:  +BS, soft, non-tender and non-distended. No rebound or guarding. No HSM or masses noted. Msk:  Symmetrical without gross deformities. Normal posture. Extremities:  Without edema. Neurologic:  Alert and  oriented x4 Psych: Normal mood and affect.    Assessment: 70 year old male with history of HTN, diabetes,  atrial fibrillation  s/p DCCV May 2022, maintained on Eliquis, GERD, and adenomatous colon polyps presenting today for GERD, dysphagia, and to discuss scheduling surveillance colonoscopy.  Also discussed constipation and elevated LFTs.  GERD: Well-controlled on Protonix 40 mg daily.  Dysphagia: Intermittent solid food and pill dysphagia with sensation of items getting hung in the upper chest, occasional regurgitation.  Prior empiric esophageal dilation in 2018 was helpful, symptoms started back a few months ago in the setting of uncontrolled GERD.  GERD is now well managed on Protonix 40 mg daily.  Patient needs EGD with possible dilation for further evaluation and therapeutic intervention.  Constipation: No alarm symptoms.  Using milk of magnesia as needed with hard stools every 2 to 3 days.  Recommended starting Colace 100-200 mg daily.   History of adenomatous colon polyps: Last colonoscopy in 2018 with tubular adenomas with recommendations to repeat in 3 years.  Currently without any alarm symptoms.  He does have mild constipation discussed above.  Elevated LFTs: Noted mild elevation of ALT during hospitalization in April for atrial fibrillation/atrial flutter.  ALT max 64, down to 47 on day of discharge.  No repeat labs since that time.  Previously, LFTs normal.  We will recheck HFP.  If LFTs are still elevated, will complete additional evaluation.  HTN: BP elevated today at 192/85. Asymptomatic. Hasn't taken diltiazem this morning.  Also had ham over the weekend which may be influencing HTN.  BP was previously well controlled at his office visit with cardiology in July.  Encourage patient to take his medications as prescribed, follow a low-sodium diet, and purchase blood pressure cuff for daily monitoring at home and follow-up with PCP/cardiology for any persistently elevated blood pressures.   Plan:  EGD +/- dilation and colonoscopy with propofol with Dr. Abbey Chatters in the near future. The risks,  benefits, and alternatives have been discussed with the patient in detail. The patient states understanding and desires to proceed. ASA III We will get the okay from cardiology to hold Eliquis x48 hours prior to procedure. See separate instructions  for diabetes medication adjustments. BMP at preop. Continue Protonix 40 mg daily. Start Colace 100-200 mg daily. (Not included on AVS, but discussed in office and called patient after visit to remind him).  Update HFP. 1800-2000 mg sodium restriction. Reinforced the importance of taking blood pressure medications as prescribed. Encourage patient to purchase blood pressure cuff to monitor BP daily.  Advise follow-up with PCP or cardiology on persistently elevated blood pressures. Advised to proceed to emergency room if he were to develop any chest pain, headache, blurry vision.   Aliene Altes, PA-C [facility] 12/08/2020

## 2020-12-06 NOTE — Progress Notes (Signed)
Referring Provider: Lucia Gaskins, MD Primary Care Physician:  Rosita Fire, MD Primary GI Physician: Dr. Abbey Chatters  Chief Complaint  Patient presents with   Dysphagia    Sometimes feels like things get hung in throat    HPI:   Kevin Goodman is a 70 y.o. male presenting today with a history of colonic adenomas overdue for 3-year surveillance with last colonoscopy in October 2018, history of GERD with empiric dilation in 2018.  History also significant for atrial fibrillation/atrial flutter s/p DCCV May 2022, maintained on Eliquis.  He is presenting today for follow-up on GERD, dysphagia, and to discuss scheduling EGD and TCS.    Last seen in our office 06/10/2020.  Reported he was being evaluated for tachycardia.  He had been off of his PPI and was having reflux as well as dysphagia.  His heart rate was 143-150 in our office, and he was sent to the emergency room.  He was also sent pantoprazole to resume and advised he would need surveillance colonoscopy and endoscopy in the near future after cardiac evaluation.  He was admitted 4/5-4/9 for atrial flutter.  He was started on rate control medications and Eliquis.  Echocardiogram during hospitalization with LVEF 55-60%, mild pulmonary hypertension.  Most recent follow-up with cardiology 09/19/2020.  Overall, he was doing well.  Reported feeling "odd" in the mornings prior to taking his medications.  Advised to continue Cardizem and Eliquis.   Today:  GERD: Well controlled on Protonix 40 mg daily.   Dysphagia: Intermittent sensation of foods getting hung in his upper chest. Solid foods and pills. No trouble with soft foods or liquids. Occasional regurgitation. Occurs once every couple of weeks. Dilation in 2018 helped. Started back a few months ago.   No abdominal pain, nausea, vomiting. Occasional constipation. Takes MOM as needed. BM every 2-3 days. Stools are hard. No brbpr or melena.   Elevated ALT: Mildly elevated ALT during  hospitalization in April, max 64, down to 47 on day of discharge.  No repeat LFTs at that time.  No abdominal imaging on file.   Occasional alcohol use, but nothing significant.  Occasional shot of liquor every now and then.  No history of IV or intranasal drug use.  No significant use of Tylenol or over-the-counter supplements.  Thinks he is vaccinated to hepatitis B.  HTN: BP elevated. Eating ham over the weekend. Asymptomatic. No CP, SOB, HA, or blurry vision. Hasn't taken Diltiazem yet today.    Taking Naproxen for his knees. Takes daily to twice daily.   Past Medical History:  Diagnosis Date   Atrial fibrillation ([place])    a. initialy occurring in 2013  b. recurrence in 06/2020 in the setting of COVID-19 --> s/p DCCV in 07/2020   Diabetes mellitus without complication ([place])    GERD (gastroesophageal reflux disease)    Gout    Hypertension     Past Surgical History:  Procedure Laterality Date   CARDIOVERSION N/A 07/09/2020   Procedure: CARDIOVERSION;  Surgeon: Arnoldo Lenis, MD;  Location[facility];  Service: Endoscopy;  Laterality: N/A;   COLONOSCOPY N/A 12/08/2016   sigmoid and descending colon diverticula, four semi-pedunculated polyps in descending and ascendign colon, few polyps in descending and ascending colon (tubular adenomas). 3 year surveillance.    ESOPHAGOGASTRODUODENOSCOPY N/A 12/08/2016   normal esophagus s/p empiric dilation, small hiatal hernia.    FINGER SURGERY     left ring   INCISION AND DRAINAGE OF WOUND Left 04/20/2016  Procedure: LEFT THIGH DEBRIDEMENT;  Surgeon: Mickeal Skinner, MD;  Location: [place];  Service: General;  Laterality: Left;   MALONEY DILATION N/A 12/08/2016   Procedure: Keturah Shavers;  Surgeon: Daneil Dolin, MD;  [facility];  Service: Endoscopy;  Laterality: N/A;   POLYPECTOMY  12/08/2016   Procedure: POLYPECTOMY;  Surgeon: Daneil Dolin, MD;  [facility];  Service: Endoscopy;;  colon    Current Outpatient  Medications  Medication Sig Dispense Refill   apixaban (ELIQUIS) 5 MG TABS tablet Take 1 tablet (5 mg total) by mouth 2 (two) times daily. 60 tablet 1   atorvastatin (LIPITOR) 20 MG tablet Take 20 mg by mouth daily.     diltiazem (CARDIZEM CD) 360 MG 24 hr capsule Take 1 capsule (360 mg total) by mouth daily. 30 capsule 1   furosemide (LASIX) 20 MG tablet Take 1 tablet (20 mg total) by mouth daily. 90 tablet 3   hydrALAZINE (APRESOLINE) 50 MG tablet Take 50 mg by mouth 2 (two) times daily.     losartan (COZAAR) 100 MG tablet Take 1 tablet (100 mg total) by mouth daily. 90 tablet 3   metFORMIN (GLUCOPHAGE) 500 MG tablet Take 500 mg by mouth 2 (two) times daily with a meal.     Multiple Vitamins-Minerals (MULTIVITAMIN WITH MINERALS) tablet Take 1 tablet by mouth daily.     naproxen (NAPROSYN) 500 MG tablet Take 500 mg by mouth 2 (two) times daily as needed.     oxymetazoline (AFRIN) 0.05 % nasal spray Place 1 spray into both nostrils 2 (two) times daily as needed for congestion.     pantoprazole (PROTONIX) 40 MG tablet Take 1 tablet (40 mg total) by mouth daily. 30 minutes before breakfast 90 tablet 3   TRESIBA FLEXTOUCH 100 UNIT/ML FlexTouch Pen Inject 30 Units into the skin at bedtime. 40 units at bedtime     vitamin C (ASCORBIC ACID) 500 MG tablet Take 500 mg by mouth daily.     dapagliflozin propanediol (FARXIGA) 10 MG TABS tablet Take 10 mg by mouth daily. (Patient not taking: Reported on 12/08/2020)     No current facility-administered medications for this visit.    Allergies as of 12/08/2020   (No Known Allergies)    Family History  Problem Relation Age of Onset   Heart failure Mother    Hypertension Mother    Hypertension Father    Stroke Father    Prostate cancer Father    Stroke Brother    Prostate cancer Brother    Colon cancer Neg Hx    Colon polyps Neg Hx     Social History   Socioeconomic History   Marital status: Widowed    Spouse name: Not on file   Number of  children: Not on file   Years of education: Not on file   Highest education level: Not on file  Occupational History   Occupation: Security    Employer: [employer]  Tobacco Use   Smoking status: Some Days    Types: Cigars    Last attempt to quit: 07/08/2018    Years since quitting: 2.4   Smokeless tobacco: Never   Tobacco comments:    smoked 2-3 cigars daily  Vaping Use   Vaping Use: Never used  Substance and Sexual Activity   Alcohol use: Yes    Comment: occasional shot of liquour every now and then    Drug use: No   Sexual activity: Yes  Other  Topics Concern   Not on file  Social History Narrative   Not on file   Social Determinants of Health   Financial Resource Strain: Not on file  Food Insecurity: Not on file  Transportation Needs: Not on file  Physical Activity: Not on file  Stress: Not on file  Social Connections: Not on file    Review of Systems: Gen: Denies fever, chills, cold or flulike symptoms, presyncope, syncope. CV: Denies chest pain, palpitations. Resp: Denies dyspnea at rest.  Admits to some shortness of breath with exertion.  No routine cough. GI: See HPI  Heme: See HPI  Physical Exam: BP (!) 192/85   Pulse 79   Temp (!) 97.1 F (36.2 C) (Temporal)   Ht 6\' 1"  (1.854 m)   Wt (!) 383 lb 6.4 oz (173.9 kg)   BMI 50.58 kg/m  General:   Alert and oriented. No distress noted. Pleasant and cooperative.  Head:  Normocephalic and atraumatic. Eyes:  Conjuctiva clear without scleral icterus. Heart:  S1, S2 present without murmurs appreciated. Lungs:  Clear to auscultation bilaterally. No wheezes, rales, or rhonchi. No distress.  Abdomen:  +BS, soft, non-tender and non-distended. No rebound or guarding. No HSM or masses noted. Msk:  Symmetrical without gross deformities. Normal posture. Extremities:  Without edema. Neurologic:  Alert and  oriented x4 Psych: Normal mood and affect.    Assessment: 70 year old male with history of HTN, diabetes,  atrial fibrillation  s/p DCCV May 2022, maintained on Eliquis, GERD, and adenomatous colon polyps presenting today for GERD, dysphagia, and to discuss scheduling surveillance colonoscopy.  Also discussed constipation and elevated LFTs.  GERD: Well-controlled on Protonix 40 mg daily.  Dysphagia: Intermittent solid food and pill dysphagia with sensation of items getting hung in the upper chest, occasional regurgitation.  Prior empiric esophageal dilation in 2018 was helpful, symptoms started back a few months ago in the setting of uncontrolled GERD.  GERD is now well managed on Protonix 40 mg daily.  Patient needs EGD with possible dilation for further evaluation and therapeutic intervention.  Constipation: No alarm symptoms.  Using milk of magnesia as needed with hard stools every 2 to 3 days.  Recommended starting Colace 100-200 mg daily.   History of adenomatous colon polyps: Last colonoscopy in 2018 with tubular adenomas with recommendations to repeat in 3 years.  Currently without any alarm symptoms.  He does have mild constipation discussed above.  Elevated LFTs: Noted mild elevation of ALT during hospitalization in April for atrial fibrillation/atrial flutter.  ALT max 64, down to 47 on day of discharge.  No repeat labs since that time.  Previously, LFTs normal.  We will recheck HFP.  If LFTs are still elevated, will complete additional evaluation.  HTN: BP elevated today at 192/85. Asymptomatic. Hasn't taken diltiazem this morning.  Also had ham over the weekend which may be influencing HTN.  BP was previously well controlled at his office visit with cardiology in July.  Encourage patient to take his medications as prescribed, follow a low-sodium diet, and purchase blood pressure cuff for daily monitoring at home and follow-up with PCP/cardiology for any persistently elevated blood pressures.   Plan:  EGD +/- dilation and colonoscopy with propofol with Dr. Abbey Chatters in the near future. The risks,  benefits, and alternatives have been discussed with the patient in detail. The patient states understanding and desires to proceed. ASA III We will get the okay from cardiology to hold Eliquis x48 hours prior to procedure. See separate instructions  for diabetes medication adjustments. BMP at preop. Continue Protonix 40 mg daily. Start Colace 100-200 mg daily. (Not included on AVS, but discussed in office and called patient after visit to remind him).  Update HFP. 1800-2000 mg sodium restriction. Reinforced the importance of taking blood pressure medications as prescribed. Encourage patient to purchase blood pressure cuff to monitor BP daily.  Advise follow-up with PCP or cardiology on persistently elevated blood pressures. Advised to proceed to emergency room if he were to develop any chest pain, headache, blurry vision.   Aliene Altes, PA-C [facility] 12/08/2020

## 2020-12-08 ENCOUNTER — Encounter: Payer: Self-pay | Admitting: Gastroenterology

## 2020-12-08 ENCOUNTER — Other Ambulatory Visit: Payer: Self-pay

## 2020-12-08 ENCOUNTER — Telehealth: Payer: Self-pay | Admitting: Gastroenterology

## 2020-12-08 ENCOUNTER — Ambulatory Visit (INDEPENDENT_AMBULATORY_CARE_PROVIDER_SITE_OTHER): Payer: Self-pay | Admitting: Gastroenterology

## 2020-12-08 VITALS — BP 192/85 | HR 79 | Temp 97.1°F | Ht 73.0 in | Wt 383.4 lb

## 2020-12-08 DIAGNOSIS — D126 Benign neoplasm of colon, unspecified: Secondary | ICD-10-CM

## 2020-12-08 DIAGNOSIS — K219 Gastro-esophageal reflux disease without esophagitis: Secondary | ICD-10-CM

## 2020-12-08 DIAGNOSIS — R7989 Other specified abnormal findings of blood chemistry: Secondary | ICD-10-CM

## 2020-12-08 DIAGNOSIS — K5900 Constipation, unspecified: Secondary | ICD-10-CM

## 2020-12-08 DIAGNOSIS — I10 Essential (primary) hypertension: Secondary | ICD-10-CM

## 2020-12-08 DIAGNOSIS — R1310 Dysphagia, unspecified: Secondary | ICD-10-CM

## 2020-12-08 NOTE — Telephone Encounter (Signed)
Attention: Preop   We would like to request holding the following medication for patient please.  Procedure: Colonoscopy, Upper Endoscopy with possible stretching of esophagus.  Date: TBD  Medication to hold: Eliquis x 48 hrs  Surgeon: Dr. Abbey Chatters  Phone: [phone]  Fax:  [phone]  Type of Anesthesia: Propofol

## 2020-12-08 NOTE — Telephone Encounter (Signed)
error 

## 2020-12-08 NOTE — Patient Instructions (Addendum)
We will arrange for you to have an upper endoscopy with possible stretching of your esophagus and colonoscopy in the near future with Dr. Abbey Chatters. We will reach out to your cardiologist to get the okay to hold Eliquis for 48 hours prior to your procedure. 1 day prior to your procedure: Take metformin as prescribed.  Take 1/2 dose of Tresiba (20 units) at bedtime. Day of procedure: Do not take any morning diabetes medications before your procedure. Keep a close check on your blood sugar while you are prepping for your colonoscopy and correct any low blood sugars with improved sugary clear liquids.  Please have blood work completed at [place] 40 mg daily for acid reflux.  To help with your blood pressure: Follow a low-sodium diet.  No more than 1800-2000 mg of sodium per day.  This includes everything you eat and drink. Be sure to take all of your blood pressure medications as prescribed. Get a blood pressure cuff from the drugstore to check your blood sugar daily and call you cardiologist or primary care doctor if your blood pressure is consistently greater than 150 on the top or greater than 90 on the bottom.  If you develop any chest pain, headache, blurry vision, proceed to the emergency room.  We will plan to see you back in the office after your procedures.  Do not hesitate to call if have questions or concerns prior to your next visit.  It was a pleasure to meet you today!  Aliene Altes, PA-C [facility]

## 2020-12-09 NOTE — Telephone Encounter (Signed)
Patient with diagnosis of A Fib on Eliquis for anticoagulation.    Procedure: Colonoscopy, Upper Endoscopy with possible stretching of esophagus. Date of procedure: TBD   CHA2DS2-VASc Score = 3  This indicates a 3.2% annual risk of stroke. The patient's score is based upon: CHF History: 0 HTN History: 1 Diabetes History: 1 Stroke History: 0 Vascular Disease History: 0 Age Score: 1 Gender Score: 0   CrCl 89 mL/min Platelet count 398K   Per office protocol, patient can hold Eliquis for 2 days prior to procedure.

## 2020-12-10 ENCOUNTER — Other Ambulatory Visit: Payer: Self-pay

## 2020-12-10 ENCOUNTER — Telehealth: Payer: Self-pay | Admitting: Internal Medicine

## 2020-12-10 MED ORDER — PEG 3350-KCL-NA BICARB-NACL 420 G PO SOLR: 4000.0000 mL | ORAL | 0 refills | Status: DC

## 2020-12-10 NOTE — Telephone Encounter (Signed)
Does a provider need to sign off or can we take the clearance from the pharmacist? Thank you!

## 2020-12-10 NOTE — Telephone Encounter (Signed)
See other phone note for today. 

## 2020-12-10 NOTE — Telephone Encounter (Signed)
Called pt, VM full needs TCS/EGD +/-DIL WITH PROPOFOL, ASA 3

## 2020-12-10 NOTE — Telephone Encounter (Signed)
Spoke to pt, TCS/EGD/-/+DIL w/Propofol ASA 3 scheduled for 12/25/20 at 9:30am. Pt informed to hold Eliquis for 48 hours before procedure. Orders entered. Rx for prep sent to pharmacy.  PA for TCS/EGD/DIL submitted via [employer] website. [employer]# [id number], valid 12/25/20-01/24/21.

## 2020-12-10 NOTE — Telephone Encounter (Signed)
Pt returning call. [phone]

## 2020-12-10 NOTE — Telephone Encounter (Addendum)
   Name: Kevin Goodman  DOB: December 17, 1950  MRN: [id number]   Primary Cardiologist: Carlyle Dolly, MD  Chart reviewed as part of pre-operative protocol coverage. Patient was contacted 12/10/2020 in reference to pre-operative risk assessment for pending surgery as outlined below.  Kevin Goodman was last seen on 09/2020 by B. Strader PA-C, chart reviewed. I reached out to patient to ensure no new changes since last OV before finalizing clearance. His number goes right to VM that's full. Wife (DPR) doesn't answer). Son Venia Minks is travelling and will call his parents to ask them to call us back.  Addendum: Pt returned call. The patient affirms he has been doing well without any new cardiac symptoms. Therefore, based on ACC/AHA guidelines, the patient would be at acceptable risk for the planned procedure without further cardiovascular testing. The patient was advised that if he develops new symptoms prior to surgery to contact our office to arrange for a follow-up visit, and he verbalized understanding. Regarding anticoag, per pharmD, Per office protocol, patient can hold Eliquis for 2 days prior to procedure.    Will route this reply to [place], CMA, as she had reached out to pool to clarify clearance was complete. It is now finalized on our end. Please call with questions.  Charlie Pitter, PA-C 12/10/2020, 9:29 AM

## 2020-12-10 NOTE — Telephone Encounter (Signed)
Clearance to hold Eliquis for 2 days prior to procedure. Please move forward with scheduling.

## 2020-12-11 NOTE — Telephone Encounter (Signed)
Pre-op appt 12/23/20. Lette mailed with procedure instructions.

## 2020-12-22 NOTE — Patient Instructions (Signed)
Your procedure is scheduled on: 12/25/2020  Report to [facility] at     7:30 AM.  Call this number if you have problems the morning of surgery: [phone]   Remember:              Follow Directions on the letter you received from Your Physician's office regarding the Bowel Prep              No Smoking the day of Procedure :   Take these medicines the morning of surgery with A SIP OF WATER: Diltiazem, hydralyzine and pantoprazole             No Diabetic medication the morning of procedure             Take only 1/2 dose of Tresiba night before procedure   Do not wear jewelry, make-up or nail polish.    Do not bring valuables to the hospital.  Contacts, dentures or bridgework may not be worn into surgery.  .   Patients discharged the day of surgery will not be allowed to drive home.     Colonoscopy, Adult, Care After This sheet gives you information about how to care for yourself after your procedure. Your health care provider may also give you more specific instructions. If you have problems or questions, contact your health care provider. What can I expect after the procedure? After the procedure, it is common to have: A small amount of blood in your stool for 24 hours after the procedure. Some gas. Mild abdominal cramping or bloating.  Follow these instructions at home: General instructions  For the first 24 hours after the procedure: Do not drive or use machinery. Do not sign important documents. Do not drink alcohol. Do your regular daily activities at a slower pace than normal. Eat soft, easy-to-digest foods. Rest often. Take over-the-counter or prescription medicines only as told by your health care provider. It is up to you to get the results of your procedure. Ask your health care provider, or the department performing the procedure, when your results will be ready. Relieving cramping and bloating Try walking around when you have cramps or feel bloated. Apply  heat to your abdomen as told by your health care provider. Use a heat source that your health care provider recommends, such as a moist heat pack or a heating pad. Place a towel between your skin and the heat source. Leave the heat on for 20-30 minutes. Remove the heat if your skin turns bright red. This is especially important if you are unable to feel pain, heat, or cold. You may have a greater risk of getting burned. Eating and drinking Drink enough fluid to keep your urine clear or pale yellow. Resume your normal diet as instructed by your health care provider. Avoid heavy or fried foods that are hard to digest. Avoid drinking alcohol for as long as instructed by your health care provider. Contact a health care provider if: You have blood in your stool 2-3 days after the procedure. Get help right away if: You have more than a small spotting of blood in your stool. You pass large blood clots in your stool. Your abdomen is swollen. You have nausea or vomiting. You have a fever. You have increasing abdominal pain that is not relieved with medicine. This information is not intended to replace advice given to you by your health care provider. Make sure you discuss any questions you have with your health care provider. Document  Released: 10/07/2003 Document Revised: 11/17/2015 Document Reviewed: 05/06/2015 Elsevier Interactive Patient Education  2018 [facility], Adult, Care After This sheet gives you information about how to care for yourself after your procedure. Your health care provider may also give you more specific instructions. If you have problems or questions, contact your health care provider. What can I expect after the procedure? After the procedure, it is common to have: A sore throat. Mild stomach pain or discomfort. Bloating. Nausea. Follow these instructions at home:  Follow instructions from your health care provider about what to eat or drink after  your procedure. Return to your normal activities as told by your health care provider. Ask your health care provider what activities are safe for you. Take over-the-counter and prescription medicines only as told by your health care provider. If you were given a sedative during the procedure, it can affect you for several hours. Do not drive or operate machinery until your health care provider says that it is safe. Keep all follow-up visits as told by your health care provider. This is important. Contact a health care provider if you have: A sore throat that lasts longer than one day. Trouble swallowing. Get help right away if: You vomit blood or your vomit looks like coffee grounds. You have: A fever. Bloody, black, or tarry stools. A severe sore throat or you cannot swallow. Difficulty breathing. Severe pain in your chest or abdomen. Summary After the procedure, it is common to have a sore throat, mild stomach discomfort, bloating, and nausea. If you were given a sedative during the procedure, it can affect you for several hours. Do not drive or operate machinery until your health care provider says that it is safe. Follow instructions from your health care provider about what to eat or drink after your procedure. Return to your normal activities as told by your health care provider. This information is not intended to replace advice given to you by your health care provider. Make sure you discuss any questions you have with your health care provider. Document Revised: 02/20/2019 Document Reviewed: 07/25/2017 Elsevier Patient Education  2022 [employer].

## 2020-12-23 ENCOUNTER — Encounter (HOSPITAL_COMMUNITY): Payer: Self-pay

## 2020-12-23 ENCOUNTER — Encounter (HOSPITAL_COMMUNITY)
Admission: RE | Admit: 2020-12-23 | Discharge: 2020-12-23 | Disposition: A | Discharge status: Home / Self Care | Payer: Medicare PPO | Source: Ambulatory Visit | Attending: Internal Medicine | Admitting: Internal Medicine

## 2020-12-23 DIAGNOSIS — Z01812 Encounter for preprocedural laboratory examination: Secondary | ICD-10-CM | POA: Insufficient documentation

## 2020-12-23 HISTORY — DX: Sleep apnea, unspecified: G47.30

## 2020-12-23 LAB — COMPREHENSIVE METABOLIC PANEL
ALT: 17 U/L (ref 0–44)
AST: 17 U/L (ref 15–41)
Albumin: 3.9 g/dL (ref 3.5–5.0)
Alkaline Phosphatase: 71 U/L (ref 38–126)
Anion gap: 8 (ref 5–15)
BUN: 19 mg/dL (ref 8–23)
CO2: 25 mmol/L (ref 22–32)
Calcium: 9.3 mg/dL (ref 8.9–10.3)
Chloride: 106 mmol/L (ref 98–111)
Creatinine, Ser: 1.2 mg/dL (ref 0.61–1.24)
GFR, Estimated: >60 mL/min (ref >60)
Glucose, Bld: 131 mg/dL — ABNORMAL HIGH (ref 70–99)
Potassium: 4.3 mmol/L (ref 3.5–5.1)
Sodium: 139 mmol/L (ref 135–145)
Total Bilirubin: 0.4 mg/dL (ref 0.3–1.2)
Total Protein: 7.2 g/dL (ref 6.5–8.1)

## 2020-12-25 ENCOUNTER — Encounter (HOSPITAL_COMMUNITY): Payer: Self-pay

## 2020-12-25 ENCOUNTER — Other Ambulatory Visit: Payer: Self-pay

## 2020-12-25 ENCOUNTER — Ambulatory Visit (HOSPITAL_COMMUNITY)
Admission: RE | Admit: 2020-12-25 | Discharge: 2020-12-25 | Disposition: A | Discharge status: Home / Self Care | Payer: Medicare PPO | Attending: Internal Medicine | Admitting: Internal Medicine

## 2020-12-25 ENCOUNTER — Ambulatory Visit (HOSPITAL_COMMUNITY): Payer: Medicare PPO | Admitting: Anesthesiology

## 2020-12-25 ENCOUNTER — Encounter (HOSPITAL_COMMUNITY)
Admission: RE | Disposition: A | Discharge status: Home / Self Care | Payer: Self-pay | Source: Home / Self Care | Attending: Internal Medicine

## 2020-12-25 DIAGNOSIS — K648 Other hemorrhoids: Secondary | ICD-10-CM | POA: Diagnosis not present

## 2020-12-25 DIAGNOSIS — Z7984 Long term (current) use of oral hypoglycemic drugs: Secondary | ICD-10-CM | POA: Insufficient documentation

## 2020-12-25 DIAGNOSIS — Z79899 Other long term (current) drug therapy: Secondary | ICD-10-CM | POA: Diagnosis not present

## 2020-12-25 DIAGNOSIS — Z86010 Personal history of colonic polyps: Secondary | ICD-10-CM | POA: Insufficient documentation

## 2020-12-25 DIAGNOSIS — E119 Type 2 diabetes mellitus without complications: Secondary | ICD-10-CM | POA: Insufficient documentation

## 2020-12-25 DIAGNOSIS — Z7901 Long term (current) use of anticoagulants: Secondary | ICD-10-CM | POA: Insufficient documentation

## 2020-12-25 DIAGNOSIS — I10 Essential (primary) hypertension: Secondary | ICD-10-CM | POA: Diagnosis not present

## 2020-12-25 DIAGNOSIS — F17290 Nicotine dependence, other tobacco product, uncomplicated: Secondary | ICD-10-CM | POA: Diagnosis not present

## 2020-12-25 DIAGNOSIS — K219 Gastro-esophageal reflux disease without esophagitis: Secondary | ICD-10-CM | POA: Insufficient documentation

## 2020-12-25 DIAGNOSIS — I4892 Unspecified atrial flutter: Secondary | ICD-10-CM | POA: Insufficient documentation

## 2020-12-25 DIAGNOSIS — Z794 Long term (current) use of insulin: Secondary | ICD-10-CM | POA: Diagnosis not present

## 2020-12-25 DIAGNOSIS — R1310 Dysphagia, unspecified: Secondary | ICD-10-CM | POA: Diagnosis not present

## 2020-12-25 DIAGNOSIS — K5900 Constipation, unspecified: Secondary | ICD-10-CM | POA: Insufficient documentation

## 2020-12-25 DIAGNOSIS — K5730 Diverticulosis of large intestine without perforation or abscess without bleeding: Secondary | ICD-10-CM | POA: Insufficient documentation

## 2020-12-25 DIAGNOSIS — K635 Polyp of colon: Secondary | ICD-10-CM | POA: Diagnosis not present

## 2020-12-25 DIAGNOSIS — I4891 Unspecified atrial fibrillation: Secondary | ICD-10-CM | POA: Diagnosis not present

## 2020-12-25 DIAGNOSIS — K2970 Gastritis, unspecified, without bleeding: Secondary | ICD-10-CM | POA: Diagnosis not present

## 2020-12-25 HISTORY — PX: ESOPHAGOGASTRODUODENOSCOPY (EGD) WITH PROPOFOL: SHX5813

## 2020-12-25 HISTORY — PX: POLYPECTOMY: SHX5525

## 2020-12-25 HISTORY — PX: BALLOON DILATION: SHX5330

## 2020-12-25 HISTORY — PX: BIOPSY: SHX5522

## 2020-12-25 HISTORY — PX: COLONOSCOPY WITH PROPOFOL: SHX5780

## 2020-12-25 LAB — GLUCOSE, CAPILLARY: Glucose-Capillary: 111 mg/dL — ABNORMAL HIGH (ref 70–99)

## 2020-12-25 SURGERY — COLONOSCOPY WITH PROPOFOL
Anesthesia: General

## 2020-12-25 MED ORDER — LACTATED RINGERS IV SOLN: INTRAVENOUS | Status: DC | PRN

## 2020-12-25 MED ORDER — PROPOFOL 500 MG/50ML IV EMUL
INTRAVENOUS | Status: DC | PRN
  Administered 2020-12-25: 150 ug/kg/min via INTRAVENOUS

## 2020-12-25 MED ORDER — PANTOPRAZOLE SODIUM 40 MG PO TBEC: 40.0000 mg | DELAYED_RELEASE_TABLET | Freq: Two times a day (BID) | ORAL | 1 refills | Status: AC

## 2020-12-25 MED ORDER — LIDOCAINE HCL (CARDIAC) PF 100 MG/5ML IV SOSY
PREFILLED_SYRINGE | INTRAVENOUS | Status: DC | PRN
  Administered 2020-12-25: 50 mg via INTRAVENOUS

## 2020-12-25 MED ORDER — STERILE WATER FOR IRRIGATION IR SOLN: Status: DC | PRN

## 2020-12-25 MED ORDER — PROPOFOL 10 MG/ML IV BOLUS
INTRAVENOUS | Status: DC | PRN
Start: 2020-12-25 — End: 2020-12-25
  Administered 2020-12-25: 100 mg via INTRAVENOUS

## 2020-12-25 NOTE — Op Note (Signed)
[facility] Patient Name: Kevin Goodman Procedure Date: 12/25/2020 8:34 AM MRN: [id number] Date of Birth: Dec 31, 1950 Attending MD: Elon Alas. Abbey Chatters DO CSN: [id number] Age: 70 Admit Type: Outpatient Procedure:                Colonoscopy Indications:              High risk colon cancer surveillance: Personal                            history of multiple (3 or more) adenomas Providers:                Elon Alas. Abbey Chatters, DO, Caprice Kluver, Raphael Gibney,                            Technician Referring MD:              Medicines:                See the Anesthesia note for documentation of the                            administered medications Complications:            No immediate complications. Estimated Blood Loss:     Estimated blood loss was minimal. Procedure:                Pre-Anesthesia Assessment:                           - The anesthesia plan was to use monitored                            anesthesia care (MAC).                           After obtaining informed consent, the colonoscope                            was passed under direct vision. Throughout the                            procedure, the patient's blood pressure, pulse, and                            oxygen saturations were monitored continuously. The                            [id number]-HQ190L ([id number]) scope was introduced through                            the anus and advanced to the the terminal ileum,                            with identification of the appendiceal orifice and                            IC valve. The colonoscopy was  performed without                            difficulty. The patient tolerated the procedure                            well. The quality of the bowel preparation was                            evaluated using the BBPS [facility] Bowel Preparation                            Scale) with scores of: Right Colon = 3, Transverse                            Colon = 3 and Left Colon = 3  (entire mucosa seen                            well with no residual staining, small fragments of                            stool or opaque liquid). The total BBPS score                            equals 9. Scope In: 8:36:20 AM Scope Out: 8:51:53 AM Scope Withdrawal Time: 0 hours 13 minutes 38 seconds  Total Procedure Duration: 0 hours 15 minutes 33 seconds  Findings:      The perianal and digital rectal examinations were normal.      Non-bleeding internal hemorrhoids were found during endoscopy.      Scattered small-mouthed diverticula were found in the entire colon.      A 2 mm polyp was found in the descending colon. The polyp was sessile.       The polyp was removed with a cold biopsy forceps. Resection and       retrieval were complete.      The terminal ileum appeared normal. Impression:               - Non-bleeding internal hemorrhoids.                           - Diverticulosis in the entire examined colon.                           - One 2 mm polyp in the descending colon, removed                            with a cold biopsy forceps. Resected and retrieved.                           - The examined portion of the ileum was normal. Moderate Sedation:      Per Anesthesia Care Recommendation:           - Patient has a contact number available for  emergencies. The signs and symptoms of potential                            delayed complications were discussed with the                            patient. Return to normal activities tomorrow.                            Written discharge instructions were provided to the                            patient.                           - Resume previous diet.                           - Continue present medications.                           - Await pathology results.                           - Repeat colonoscopy in 5 years for surveillance.                           - Return to GI clinic in 4  months. Procedure Code(s):        --- Professional ---                           [phone], Colonoscopy, flexible; with biopsy, single                            or multiple Diagnosis Code(s):        --- Professional ---                           Z86.010, Personal history of colonic polyps                           K64.8, Other hemorrhoids                           K63.5, Polyp of colon                           K57.30, Diverticulosis of large intestine without                            perforation or abscess without bleeding CPT copyright 2019 American Medical Association. All rights reserved. The codes documented in this report are preliminary and upon coder review may  be revised to meet current compliance requirements. Elon Alas. Abbey Chatters, DO [place] Abbey Chatters, DO 12/25/2020 8:54:44 AM This report has been signed electronically. Number of Addenda: 0

## 2020-12-25 NOTE — Interval H&P Note (Signed)
History and Physical Interval Note:  12/25/2020 8:09 AM  Kevin Goodman  has presented today for surgery, with the diagnosis of dysphagia, GERD, history of colonic adenomas.  The various methods of treatment have been discussed with the patient and family. After consideration of risks, benefits and other options for treatment, the patient has consented to  Procedure(s) with comments: COLONOSCOPY WITH PROPOFOL (N/A) - 9:30am ESOPHAGOGASTRODUODENOSCOPY (EGD) WITH PROPOFOL (N/A) BALLOON DILATION (N/A) as a surgical intervention.  The patient's history has been reviewed, patient examined, no change in status, stable for surgery.  I have reviewed the patient's chart and labs.  Questions were answered to the patient's satisfaction.     Kevin Goodman

## 2020-12-25 NOTE — Anesthesia Preprocedure Evaluation (Signed)
Anesthesia Evaluation  Patient identified by MRN, date of birth, ID band Patient awake    Reviewed: Allergy & Precautions, NPO status , Patient's Chart, lab work & pertinent test results  Airway Mallampati: II  TM Distance: >3 FB Neck ROM: Full    Dental  (+) Dental Advisory Given, Missing, Chipped,    Pulmonary sleep apnea , pneumonia, resolved, Current Smoker,    Pulmonary exam normal breath sounds clear to auscultation       Cardiovascular hypertension, Pt. on medications Normal cardiovascular exam+ dysrhythmias Atrial Fibrillation  Rhythm:Regular Rate:Normal  EKG - NSR, Incomplete RBBB   Neuro/Psych negative neurological ROS     GI/Hepatic Neg liver ROS, GERD  Medicated,  Endo/Other  diabetes, Well Controlled, Type 2, Oral Hypoglycemic Agents, Insulin DependentMorbid obesity  Renal/GU negative Renal ROS     Musculoskeletal negative musculoskeletal ROS (+)   Abdominal   Peds  Hematology negative hematology ROS (+)   Anesthesia Other Findings   Reproductive/Obstetrics negative OB ROS                             Anesthesia Physical Anesthesia Plan  ASA: 3  Anesthesia Plan: General   Post-op Pain Management:    Induction: Intravenous  PONV Risk Score and Plan: TIVA  Airway Management Planned: Nasal Cannula and Natural Airway  Additional Equipment:   Intra-op Plan:   Post-operative Plan:   Informed Consent: I have reviewed the patients History and Physical, chart, labs and discussed the procedure including the risks, benefits and alternatives for the proposed anesthesia with the patient or authorized representative who has indicated his/her understanding and acceptance.     Dental advisory given  Plan Discussed with: CRNA and Surgeon  Anesthesia Plan Comments:         Anesthesia Quick Evaluation

## 2020-12-25 NOTE — Anesthesia Postprocedure Evaluation (Signed)
Anesthesia Post Note  Patient: KAIYU MIRABAL  Procedure(s) Performed: COLONOSCOPY WITH PROPOFOL ESOPHAGOGASTRODUODENOSCOPY (EGD) WITH PROPOFOL BALLOON DILATION BIOPSY POLYPECTOMY  Patient location during evaluation: PACU Anesthesia Type: General Level of consciousness: awake and alert and oriented Pain management: pain level controlled Vital Signs Assessment: post-procedure vital signs reviewed and stable Respiratory status: spontaneous breathing, nonlabored ventilation and respiratory function stable Cardiovascular status: blood pressure returned to baseline and stable Postop Assessment: no apparent nausea or vomiting Anesthetic complications: no   No notable events documented.   Last Vitals:  Vitals:   12/25/20 0856 12/25/20 0900  BP: (!) 93/45 120/60  Pulse: 70   Resp: 20   Temp:  36.8 C  SpO2: 96%     Last Pain:  Vitals:   12/25/20 0900  TempSrc: Axillary  PainSc:                  Adilen Pavelko C [place]

## 2020-12-25 NOTE — Discharge Instructions (Addendum)
EGD Discharge instructions Please read the instructions outlined below and refer to this sheet in the next few weeks. These discharge instructions provide you with general information on caring for yourself after you leave the hospital. Your doctor may also give you specific instructions. While your treatment has been planned according to the most current medical practices available, unavoidable complications occasionally occur. If you have any problems or questions after discharge, please call your doctor. ACTIVITY You may resume your regular activity but move at a slower pace for the next 24 hours.  Take frequent rest periods for the next 24 hours.  Walking will help expel (get rid of) the air and reduce the bloated feeling in your abdomen.  No driving for 24 hours (because of the anesthesia (medicine) used during the test).  You may shower.  Do not sign any important legal documents or operate any machinery for 24 hours (because of the anesthesia used during the test).  NUTRITION Drink plenty of fluids.  You may resume your normal diet.  Begin with a light meal and progress to your normal diet.  Avoid alcoholic beverages for 24 hours or as instructed by your caregiver.  MEDICATIONS You may resume your normal medications unless your caregiver tells you otherwise.  WHAT YOU CAN EXPECT TODAY You may experience abdominal discomfort such as a feeling of fullness or "gas" pains.  FOLLOW-UP Your doctor will discuss the results of your test with you.  SEEK IMMEDIATE MEDICAL ATTENTION IF ANY OF THE FOLLOWING OCCUR: Excessive nausea (feeling sick to your stomach) and/or vomiting.  Severe abdominal pain and distention (swelling).  Trouble swallowing.  Temperature over 101 F (37.8 C).  Rectal bleeding or vomiting of blood.     Colonoscopy Discharge Instructions  Read the instructions outlined below and refer to this sheet in the next few weeks. These discharge instructions provide you with  general information on caring for yourself after you leave the hospital. Your doctor may also give you specific instructions. While your treatment has been planned according to the most current medical practices available, unavoidable complications occasionally occur.   ACTIVITY You may resume your regular activity, but move at a slower pace for the next 24 hours.  Take frequent rest periods for the next 24 hours.  Walking will help get rid of the air and reduce the bloated feeling in your belly (abdomen).  No driving for 24 hours (because of the medicine (anesthesia) used during the test).   Do not sign any important legal documents or operate any machinery for 24 hours (because of the anesthesia used during the test).  NUTRITION Drink plenty of fluids.  You may resume your normal diet as instructed by your doctor.  Begin with a light meal and progress to your normal diet. Heavy or fried foods are harder to digest and may make you feel sick to your stomach (nauseated).  Avoid alcoholic beverages for 24 hours or as instructed.  MEDICATIONS You may resume your normal medications unless your doctor tells you otherwise.  WHAT YOU CAN EXPECT TODAY Some feelings of bloating in the abdomen.  Passage of more gas than usual.  Spotting of blood in your stool or on the toilet paper.  IF YOU HAD POLYPS REMOVED DURING THE COLONOSCOPY: No aspirin products for 7 days or as instructed.  No alcohol for 7 days or as instructed.  Eat a soft diet for the next 24 hours.  FINDING OUT THE RESULTS OF YOUR TEST Not all test results are  available during your visit. If your test results are not back during the visit, make an appointment with your caregiver to find out the results. Do not assume everything is normal if you have not heard from your caregiver or the medical facility. It is important for you to follow up on all of your test results.  SEEK IMMEDIATE MEDICAL ATTENTION IF: You have more than a spotting of  blood in your stool.  Your belly is swollen (abdominal distention).  You are nauseated or vomiting.  You have a temperature over 101.  You have abdominal pain or discomfort that is severe or gets worse throughout the day.   Your EGD revealed mild amount inflammation in your stomach.  I took biopsies of this to rule out infection with a bacteria called H. pylori.  Await pathology results, my office will contact you. I did stretch your esophagus, hopefully this helps with your swallowing. I am going in increase your pantoprazole to twice daily for there next 12 weeks, then you can go back to once daily.   Your colonoscopy revealed 1 polyp(s) which I removed successfully. Await pathology results, my office will contact you. I recommend repeating colonoscopy in 5 years for surveillance purposes.   You also have diverticulosis and internal hemorrhoids. I would recommend increasing fiber in your diet or adding OTC Benefiber/Metamucil. Be sure to drink at least 4 to 6 glasses of water daily.   Follow up with GI in 4 months.       I hope you have a great rest of your week!  Elon Alas. Abbey Chatters, D.O. Gastroenterology and Hepatology [facility]

## 2020-12-25 NOTE — Op Note (Signed)
[facility] Patient Name: Kevin Goodman Procedure Date: 12/25/2020 8:12 AM MRN: [id number] Date of Birth: [birth year] Attending MD: Elon Alas. Abbey Chatters DO CSN: [id number] Age: 70 Admit Type: Outpatient Procedure:                Upper GI endoscopy Indications:              Dysphagia Providers:                Elon Alas. Abbey Chatters, DO, Caprice Kluver, Raphael Gibney,                            Technician Referring MD:              Medicines:                See the Anesthesia note for documentation of the                            administered medications Complications:            No immediate complications. Estimated Blood Loss:     Estimated blood loss was minimal. Procedure:                Pre-Anesthesia Assessment:                           - The anesthesia plan was to use monitored                            anesthesia care (MAC).                           After obtaining informed consent, the endoscope was                            passed under direct vision. Throughout the                            procedure, the patient's blood pressure, pulse, and                            oxygen saturations were monitored continuously. The                            GIF-H190 ([id number]) scope was introduced through the                            mouth, and advanced to the second part of duodenum.                            The upper GI endoscopy was accomplished without                            difficulty. The patient tolerated the procedure                            well. Scope In: 8:25:39 AM Scope Out: 8:30:53  AM Total Procedure Duration: 0 hours 5 minutes 14 seconds  Findings:      There is no endoscopic evidence of areas of erosion, esophagitis, hiatal       hernia, ulcerations or varices in the entire esophagus.      The Z-line was regular and was found 40 cm from the incisors.      No endoscopic abnormality was evident in the esophagus to explain the       patient's complaint of  dysphagia. Preparations were made for empiric       dilation. A TTS dilator was passed through the scope. Dilation with an       18-19-20 mm balloon dilator was performed to 20 mm. Dilation was       performed with a mild resistance at 20 mm. Estimated blood loss was none.      Localized mild inflammation characterized by erythema was found in the       gastric antrum. Biopsies were taken with a cold forceps for Helicobacter       pylori testing.      The duodenal bulb, first portion of the duodenum and second portion of       the duodenum were normal. Impression:               - Z-line regular, 40 cm from the incisors.                           - Gastritis. Biopsied.                           - Normal duodenal bulb, first portion of the                            duodenum and second portion of the duodenum. Moderate Sedation:      Per Anesthesia Care Recommendation:           - Patient has a contact number available for                            emergencies. The signs and symptoms of potential                            delayed complications were discussed with the                            patient. Return to normal activities tomorrow.                            Written discharge instructions were provided to the                            patient.                           - Resume previous diet.                           - Continue present medications.                           -  Await pathology results.                           - Repeat upper endoscopy PRN for retreatment.                           - Return to GI clinic in 4 months.                           - Use a proton pump inhibitor PO BID for 12 weeks                            then decrease back down to once daily. Procedure Code(s):        --- Professional ---                           [phone], Esophagogastroduodenoscopy, flexible,                            transoral; with biopsy, single or multiple Diagnosis Code(s):         --- Professional ---                           K29.70, Gastritis, unspecified, without bleeding                           R13.10, Dysphagia, unspecified CPT copyright 2019 American [employer] Association. All rights reserved. The codes documented in this report are preliminary and upon coder review may  be revised to meet current compliance requirements. Elon Alas. Abbey Chatters, DO Louise Abbey Chatters, DO 12/25/2020 8:33:37 AM This report has been signed electronically. Number of Addenda: 0

## 2020-12-25 NOTE — Transfer of Care (Signed)
Immediate Anesthesia Transfer of Care Note  Patient: Kevin Goodman  Procedure(s) Performed: COLONOSCOPY WITH PROPOFOL ESOPHAGOGASTRODUODENOSCOPY (EGD) WITH PROPOFOL BALLOON DILATION BIOPSY POLYPECTOMY  Patient Location: Short Stay  Anesthesia Type:General  Level of Consciousness: drowsy  Airway & Oxygen Therapy: Patient Spontanous Breathing  Post-op Assessment: Report given to RN and Post -op Vital signs reviewed and stable  Post vital signs: Reviewed and stable  Last Vitals:  Vitals Value Taken Time  BP 93/45 12/25/20 0856  Temp    Pulse 70 12/25/20 0856  Resp 20 12/25/20 0856  SpO2 96 % 12/25/20 0856    Last Pain:  Vitals:   12/25/20 0856  TempSrc: Axillary  PainSc: Asleep         Complications: No notable events documented.

## 2020-12-29 ENCOUNTER — Encounter (HOSPITAL_COMMUNITY): Payer: Self-pay | Admitting: Internal Medicine

## 2020-12-29 LAB — SURGICAL PATHOLOGY

## 2021-01-05 ENCOUNTER — Encounter: Payer: Self-pay | Admitting: Internal Medicine

## 2021-03-05 ENCOUNTER — Ambulatory Visit: Payer: Medicare PPO | Admitting: Cardiology

## 2021-03-06 ENCOUNTER — Encounter (HOSPITAL_COMMUNITY): Payer: Self-pay

## 2021-03-06 ENCOUNTER — Other Ambulatory Visit: Payer: Self-pay

## 2021-03-06 DIAGNOSIS — Z7901 Long term (current) use of anticoagulants: Secondary | ICD-10-CM | POA: Diagnosis not present

## 2021-03-06 DIAGNOSIS — Z79899 Other long term (current) drug therapy: Secondary | ICD-10-CM | POA: Insufficient documentation

## 2021-03-06 DIAGNOSIS — W458XXA Other foreign body or object entering through skin, initial encounter: Secondary | ICD-10-CM | POA: Insufficient documentation

## 2021-03-06 DIAGNOSIS — F17290 Nicotine dependence, other tobacco product, uncomplicated: Secondary | ICD-10-CM | POA: Insufficient documentation

## 2021-03-06 DIAGNOSIS — I10 Essential (primary) hypertension: Secondary | ICD-10-CM | POA: Insufficient documentation

## 2021-03-06 DIAGNOSIS — I4891 Unspecified atrial fibrillation: Secondary | ICD-10-CM | POA: Diagnosis not present

## 2021-03-06 DIAGNOSIS — Z23 Encounter for immunization: Secondary | ICD-10-CM | POA: Diagnosis not present

## 2021-03-06 DIAGNOSIS — Z7984 Long term (current) use of oral hypoglycemic drugs: Secondary | ICD-10-CM | POA: Insufficient documentation

## 2021-03-06 DIAGNOSIS — Z8616 Personal history of COVID-19: Secondary | ICD-10-CM | POA: Insufficient documentation

## 2021-03-06 DIAGNOSIS — E119 Type 2 diabetes mellitus without complications: Secondary | ICD-10-CM | POA: Diagnosis not present

## 2021-03-06 DIAGNOSIS — S60455A Superficial foreign body of left ring finger, initial encounter: Secondary | ICD-10-CM | POA: Diagnosis present

## 2021-03-06 NOTE — ED Triage Notes (Addendum)
Pov from home with cc of fish hook in left ring finger. said he was reaching in his junk drawer when he got the hook in his finger.  Not UTD on tetanus.

## 2021-03-07 ENCOUNTER — Emergency Department (HOSPITAL_COMMUNITY): Payer: Medicare PPO

## 2021-03-07 ENCOUNTER — Emergency Department (HOSPITAL_COMMUNITY)
Admission: EM | Admit: 2021-03-07 | Discharge: 2021-03-07 | Disposition: A | Discharge status: Home / Self Care | Payer: Medicare PPO | Attending: Emergency Medicine | Admitting: Emergency Medicine

## 2021-03-07 DIAGNOSIS — S6990XA Unspecified injury of unspecified wrist, hand and finger(s), initial encounter: Secondary | ICD-10-CM

## 2021-03-07 MED ORDER — POVIDONE-IODINE 10 % EX SOLN
CUTANEOUS | Status: AC
  Filled 2021-03-07: qty 14.8

## 2021-03-07 MED ORDER — POVIDONE-IODINE 5 % EX SOLN
Freq: Once | CUTANEOUS | Status: AC
  Filled 2021-03-07: qty 88.7

## 2021-03-07 MED ORDER — CEPHALEXIN 500 MG PO CAPS: 500.0000 mg | ORAL_CAPSULE | Freq: Three times a day (TID) | ORAL | 0 refills | Status: DC

## 2021-03-07 MED ORDER — LIDOCAINE HCL (PF) 1 % IJ SOLN
20.0000 mL | Freq: Once | INTRAMUSCULAR | Status: AC
  Administered 2021-03-07: 20 mL
  Filled 2021-03-07: qty 30

## 2021-03-07 MED ORDER — TETANUS-DIPHTH-ACELL PERTUSSIS 5-2.5-18.5 LF-MCG/0.5 IM SUSY
0.5000 mL | PREFILLED_SYRINGE | Freq: Once | INTRAMUSCULAR | Status: AC
  Administered 2021-03-07: 0.5 mL via INTRAMUSCULAR
  Filled 2021-03-07: qty 0.5

## 2021-03-07 NOTE — ED Provider Notes (Signed)
[facility] Provider Note   CSN: [id number] Arrival date & time: 03/06/21  2345     History No chief complaint on file.   Kevin Goodman is a 70 y.o. male.  Patient with a history of diabetes, atrial fibrillation on Eliquis presenting with fishhook embedded in the left ring finger.  States he was reaching into a drawer when he accidentally poked his finger several hours ago.  He has chronic weakness and deformity to this finger after previous injury.  No new weakness, numbness or tingling.  No fevers, chills, nausea or vomiting.  No chest pain or shortness of breath. Needs tetanus shot  The history is provided by the patient.      Past Medical History:  Diagnosis Date   Atrial fibrillation ([place])    a. initialy occurring in 2013  b. recurrence in 06/2020 in the setting of COVID-19 --> s/p DCCV in 07/2020   Diabetes mellitus without complication (H[facility])    GERD (gastroesophageal reflux disease)    Gout    Hypertension    Sleep apnea     Patient Active Problem List   Diagnosis Date Noted   Elevated LFTs 12/08/2020   Constipation 12/08/2020   Pneumonia due to COVID-19 virus 06/11/2020   Uncontrolled type 2 diabetes mellitus with hyperglycemia, with long-term current use of insulin ([place]) 06/11/2020   Atrial flutter with rapid ventricular response ([place])    Colon adenomas 06/10/2020   Gastroesophageal reflux disease 06/10/2020   A-fib ([place]) 06/10/2020   Dysphagia 11/11/2016   Encounter for screening colonoscopy 11/11/2016   Abscess 04/19/2016   Long term (current) use of anticoagulants 07/29/2011   Type 2 diabetes mellitus ([place]) 07/20/2011   Atrial fibrillation ([place]) 07/19/2011   Hypertension 07/19/2011   Morbid obesity ([place]) 07/19/2011    Past Surgical History:  Procedure Laterality Date   BALLOON DILATION N/A 12/25/2020   Procedure: BALLOON DILATION;  Surgeon: Eloise Harman, DO;  [facility];  Service: Endoscopy;  Laterality:  N/A;   BIOPSY  12/25/2020   Procedure: BIOPSY;  Surgeon: Eloise Harman, DO;  [facility];  Service: Endoscopy;;   CARDIOVERSION N/A 07/09/2020   Procedure: CARDIOVERSION;  Surgeon: Arnoldo Lenis, MD;  Location: [facility];  Service: Endoscopy;  Laterality: N/A;   COLONOSCOPY N/A 12/08/2016   sigmoid and descending colon diverticula, four semi-pedunculated polyps in descending and ascendign colon, few polyps in descending and ascending colon (tubular adenomas). 3 year surveillance.    COLONOSCOPY WITH PROPOFOL N/A 12/25/2020   Procedure: COLONOSCOPY WITH PROPOFOL;  Surgeon: Eloise Harman, DO;  [facility];  Service: Endoscopy;  Laterality: N/A;  9:30am   ESOPHAGOGASTRODUODENOSCOPY N/A 12/08/2016   normal esophagus s/p empiric dilation, small hiatal hernia.    ESOPHAGOGASTRODUODENOSCOPY (EGD) WITH PROPOFOL N/A 12/25/2020   Procedure: ESOPHAGOGASTRODUODENOSCOPY (EGD) WITH PROPOFOL;  Surgeon: Eloise Harman, DO;  [facility];  Service: Endoscopy;  Laterality: N/A;   FINGER SURGERY     left ring   INCISION AND DRAINAGE OF WOUND Left 04/20/2016   Procedure: LEFT THIGH DEBRIDEMENT;  Surgeon: Mickeal Skinner, MD;  Location: [facility];  Service: General;  Laterality: Left;   MALONEY DILATION N/A 12/08/2016   Procedure: Keturah Shavers;  Surgeon: Daneil Dolin, MD;  [facility];  Service: Endoscopy;  Laterality: N/A;   POLYPECTOMY  12/08/2016   Procedure: POLYPECTOMY;  Surgeon: Daneil Dolin, MD;  [facility];  Service: Endoscopy;;  colon   POLYPECTOMY  12/25/2020   Procedure: POLYPECTOMY;  Surgeon: Eloise Harman, DO;  [facility];  Service: Endoscopy;;       Family History  Problem Relation Age of Onset   Heart failure Mother    Hypertension Mother    Hypertension Father    Stroke Father    Prostate cancer Father    Stroke Brother    Prostate cancer Brother    Colon cancer Neg Hx    Colon polyps Neg Hx      Social History   Tobacco Use   Smoking status: Some Days    Types: Cigars    Last attempt to quit: 07/08/2018    Years since quitting: 2.6   Smokeless tobacco: Never   Tobacco comments:    smoked 2-3 cigars daily  Vaping Use   Vaping Use: Never used  Substance Use Topics   Alcohol use: Yes    Comment: occasional shot of liquour every now and then    Drug use: No    Home Medications Prior to Admission medications   Medication Sig Start Date End Date Taking? Authorizing Provider  apixaban (ELIQUIS) 5 MG TABS tablet Take 1 tablet (5 mg total) by mouth 2 (two) times daily. 06/14/20   Orson Eva, MD  atorvastatin (LIPITOR) 20 MG tablet Take 20 mg by mouth daily.    [provider]  dapagliflozin propanediol (FARXIGA) 10 MG TABS tablet Take 10 mg by mouth daily.    [provider]  diltiazem (CARDIZEM CD) 360 MG 24 hr capsule Take 1 capsule (360 mg total) by mouth daily. 06/15/20   Orson Eva, MD  furosemide (LASIX) 20 MG tablet Take 1 tablet (20 mg total) by mouth daily. 07/03/20 12/23/20  Strader, Fransisco Hertz, PA-C  hydrALAZINE (APRESOLINE) 50 MG tablet Take 50 mg by mouth 2 (two) times daily.    [provider]  losartan (COZAAR) 100 MG tablet Take 1 tablet (100 mg total) by mouth daily. 10/27/20 01/25/21  Erma Heritage, PA-C  metFORMIN (GLUCOPHAGE) 500 MG tablet Take 500 mg by mouth 2 (two) times daily with a meal.    [provider]  Multiple Vitamins-Minerals (MULTIVITAMIN WITH MINERALS) tablet Take 1 tablet by mouth daily.    [provider]  naproxen (NAPROSYN) 500 MG tablet Take 500 mg by mouth 2 (two) times daily as needed.    [provider]  oxymetazoline (AFRIN) 0.05 % nasal spray Place 1 spray into both nostrils 2 (two) times daily as needed for congestion.    [provider]  pantoprazole (PROTONIX) 40 MG tablet Take 1 tablet (40 mg total) by mouth 2 (two) times daily before a meal. 30 minutes before breakfast  12/25/20 06/23/21  Eloise Harman, DO  polyethylene glycol-electrolytes (TRILYTE) 420 g solution Take 4,000 mLs by mouth as directed. 12/10/20   Carver, Charles K, DO  TRESIBA FLEXTOUCH 100 UNIT/ML FlexTouch Pen Inject 40 Units into the skin at bedtime. 05/06/20   [provider]  vitamin C (ASCORBIC ACID) 500 MG tablet Take 500 mg by mouth daily.    [provider]    Allergies    Patient has no known allergies.  Review of Systems   Review of Systems  Constitutional:  Negative for activity change, appetite change and fever.  HENT:  Negative for congestion and rhinorrhea.   Respiratory:  Negative for cough, chest tightness and shortness of breath.   Gastrointestinal:  Negative for abdominal pain, nausea  and vomiting.  Musculoskeletal:  Positive for arthralgias and myalgias.  Skin:  Negative for rash.  Neurological:  Negative for dizziness, weakness and headaches.   all other systems are negative except as noted in the HPI and PMH.   Physical Exam Updated Vital Signs BP (!) 148/97    Pulse (!) 50    Temp 98 F (36.7 C)    Resp 19    Ht 6\' 1"  (1.854 m)    Wt (!) 172.4 kg    SpO2 98%    BMI 50.13 kg/m   Physical Exam Vitals and nursing note reviewed.  Constitutional:      General: He is not in acute distress.    Appearance: He is well-developed.  HENT:     Head: Normocephalic and atraumatic.     Mouth/Throat:     Pharynx: No oropharyngeal exudate.  Eyes:     Conjunctiva/sclera: Conjunctivae normal.     Pupils: Pupils are equal, round, and reactive to light.  Neck:     Comments: No meningismus. Cardiovascular:     Rate and Rhythm: Normal rate and regular rhythm.     Heart sounds: Normal heart sounds. No murmur heard. Pulmonary:     Effort: Pulmonary effort is normal. No respiratory distress.     Breath sounds: Normal breath sounds.  Abdominal:     Palpations: Abdomen is soft.     Tenderness: There is no abdominal tenderness. There is no guarding or  rebound.  Musculoskeletal:        General: Tenderness and signs of injury present. Normal range of motion.     Cervical back: Normal range of motion and neck supple.     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 Neurovascular intact distally  Skin:    General: Skin is warm.  Neurological:     Mental Status: He is alert and oriented to person, place, and time.     Cranial Nerves: No cranial nerve deficit.     Motor: No abnormal muscle tone.     Coordination: Coordination normal.     Comments:  5/5 strength throughout. CN 2-12 intact.Equal grip strength.   Psychiatric:        Behavior: Behavior normal.    ED Results / Procedures / Treatments   Labs (all labs ordered are listed, but only abnormal results are displayed) Labs Reviewed - No data to display  EKG None  Radiology DG Finger Ring Left  Result Date: 03/07/2021 CLINICAL DATA:  Status post fish hook removal. EXAM: LEFT RING FINGER 2+V COMPARISON:  None. FINDINGS: There is no evidence of fracture or dislocation. Degenerative changes are seen involving the PIP joint of the fourth left finger. No radiopaque soft tissue foreign bodies are identified. IMPRESSION: No radiopaque soft tissue foreign body. Electronically Signed   By: Virgina Norfolk M.D.   On: 03/07/2021 03:43    Procedures .Foreign Body Removal  Date/Time: 03/07/2021 2:13 AM Performed by: Ezequiel Essex, MD Authorized by: Ezequiel Essex, MD  Consent: Verbal consent obtained. Risks and benefits: risks, benefits and alternatives were discussed Consent given by: patient Patient understanding: patient states understanding of the procedure being performed Patient identity confirmed: verbally with patient Body area: skin General location: upper extremity Location details: left ring finger Anesthesia: digital block  Anesthesia: Local Anesthetic: lidocaine 1% without  epinephrine Anesthetic total: 10 mL  Sedation: Patient sedated: no  Patient restrained: no Patient cooperative: yes Localization method:  visualized Removal mechanism: forceps Dressing: antibiotic ointment Tendon involvement: none Depth: subcutaneous Complexity: simple 1 objects recovered. Objects recovered: fish hook Post-procedure assessment: foreign body removed Patient tolerance: patient tolerated the procedure well with no immediate complications    Medications Ordered in ED Medications  povidone-iodine (BETADINE) 10 % external solution (has no administration in time range)  lidocaine (PF) (XYLOCAINE) 1 % injection 20 mL (20 mLs Infiltration Given 03/07/21 0155)  povidone-Iodine (BETADINE) 5 % topical solution ( Topical Given 03/07/21 0158)  Tdap (BOOSTRIX) injection 0.5 mL (0.5 mLs Intramuscular Given 03/07/21 0124)    ED Course  I have reviewed the triage vital signs and the nursing notes.  Pertinent labs & imaging results that were available during my care of the patient were reviewed by me and considered in my medical decision making (see chart for details).    MDM Rules/Calculators/A&P                         Fish hook embedded to left ring finger.  Neurovascularly intact.  Digital block performed and fishhook removed as above. Tetanus updated  Fish hook removed without difficulty.  X-ray shows no residual foreign body.  Wound care instructions given.  Antibiotics prophylactically.  Follow-up with PCP.  Return precautions discussed    Final Clinical Impression(s) / ED Diagnoses Final diagnoses:  Fish hook in hand    Rx / DC Orders ED Discharge Orders     None        Raysa Bosak, Annie Main, MD 03/07/21 [phone]

## 2021-03-07 NOTE — ED Notes (Signed)
Suture cart at bedside; edp made aware

## 2021-03-07 NOTE — Discharge Instructions (Signed)
Keep wound clean and dry.  Take the antibiotics as prescribed and follow-up with your doctor.  Return to the ED with worsening pain, weakness, numbness, tingling, bleeding or drainage or any other concerns.

## 2021-03-26 ENCOUNTER — Ambulatory Visit: Payer: Medicare PPO | Admitting: Cardiology

## 2021-04-24 ENCOUNTER — Ambulatory Visit: Payer: Medicare PPO | Admitting: Student

## 2021-05-05 DIAGNOSIS — E1165 Type 2 diabetes mellitus with hyperglycemia: Secondary | ICD-10-CM | POA: Diagnosis not present

## 2021-05-05 DIAGNOSIS — I10 Essential (primary) hypertension: Secondary | ICD-10-CM | POA: Diagnosis not present

## 2021-05-20 DIAGNOSIS — Z0001 Encounter for general adult medical examination with abnormal findings: Secondary | ICD-10-CM | POA: Diagnosis not present

## 2021-05-20 DIAGNOSIS — E1165 Type 2 diabetes mellitus with hyperglycemia: Secondary | ICD-10-CM | POA: Diagnosis not present

## 2021-05-20 DIAGNOSIS — I10 Essential (primary) hypertension: Secondary | ICD-10-CM | POA: Diagnosis not present

## 2021-05-20 DIAGNOSIS — Z1389 Encounter for screening for other disorder: Secondary | ICD-10-CM | POA: Diagnosis not present

## 2021-05-20 DIAGNOSIS — Z1331 Encounter for screening for depression: Secondary | ICD-10-CM | POA: Diagnosis not present

## 2021-05-20 DIAGNOSIS — I480 Paroxysmal atrial fibrillation: Secondary | ICD-10-CM | POA: Diagnosis not present

## 2021-05-25 NOTE — Progress Notes (Deleted)
? ? ?Referring Provider: Carrolyn Meiers* ?Primary Care Physician:  Carrolyn Meiers, MD ?Primary GI Physician: Dr. Abbey Chatters ? ?No chief complaint on file. ? ? ?HPI:   ?Kevin Goodman is a 71 y.o. male with history of GERD, dysphagia, constipation, adenomatous colon polyps, elevated ALT during hospitalization for a flutter in April 2022, presenting today for follow-up s/p EGD and colonoscopy.  ? ?Last seen in our office 12/08/2020.  GERD was well controlled on Protonix 40 mg daily.  Reported intermittent solid food and pill dysphagia.  Also with occasional constipation with hard stools, bowel movements every 2 to 3 days, using MOM as needed.  Noted mildly elevated ALT during hospitalization in April for which he was admitted with atrial flutter.  Max ALT at 64, down to 47 on day of discharge.  Reported occasional alcohol use, but nothing significant.  No history of IV or intranasal drug use.  No regular Tylenol or over-the-counter supplements.  Recommended continuing Protonix daily, start Colace 100-200 mg daily, arrange EGD and colonoscopy, repeat HFP. ? ?CMP 12/23/2020 with LFTs within normal limits. ? ?EGD colonoscopy 12/25/2020: ?Colonoscopy: Nonbleeding internal hemorrhoids, pancolonic diverticulosis, 2 mm tubular adenoma.  Recommended repeat in 5 years. ?EGD: Normal esophagus s/p empiric dilation, gastritis biopsied, normal examined duodenum.  Pathology with slight chronic inflammation, no H. pylori.  Recommended PPI twice daily x12 weeks then decrease to once daily.  ? ? ?Today:  ? ?GERD:  ? ?Dysphagia:  ? ?Constipation:  ? ? ?Past Medical History:  ?Diagnosis Date  ? Atrial fibrillation ([place])   ? a. initialy occurring in 2013  b. recurrence in 06/2020 in the setting of COVID-19 --> s/p DCCV in 07/2020  ? Diabetes mellitus without complication ([place])   ? GERD (gastroesophageal reflux disease)   ? Gout   ? Hypertension   ? Sleep apnea   ? ? ?Past Surgical History:  ?Procedure Laterality Date  ?  BALLOON DILATION N/A 12/25/2020  ? Procedure: BALLOON DILATION;  Surgeon: Eloise Harman, DO;  [facility];  Service: Endoscopy;  Laterality: N/A;  ? BIOPSY  12/25/2020  ? Procedure: BIOPSY;  Surgeon: Eloise Harman, DO;  [facility];  Service: Endoscopy;;  ? CARDIOVERSION N/A 07/09/2020  ? Procedure: CARDIOVERSION;  Surgeon: Arnoldo Lenis, MD;  [facility];  Service: Endoscopy;  Laterality: N/A;  ? COLONOSCOPY N/A 12/08/2016  ? sigmoid and descending colon diverticula, four semi-pedunculated polyps in descending and ascendign colon, few polyps in descending and ascending colon (tubular adenomas). 3 year surveillance.   ? COLONOSCOPY WITH PROPOFOL N/A 12/25/2020  ? Procedure: COLONOSCOPY WITH PROPOFOL;  Surgeon: Eloise Harman, DO;  [facility];  Service: Endoscopy;  Laterality: N/A;  9:30am  ? ESOPHAGOGASTRODUODENOSCOPY N/A 12/08/2016  ? normal esophagus s/p empiric dilation, small hiatal hernia.   ? ESOPHAGOGASTRODUODENOSCOPY (EGD) WITH PROPOFOL N/A 12/25/2020  ? Procedure: ESOPHAGOGASTRODUODENOSCOPY (EGD) WITH PROPOFOL;  Surgeon: Eloise Harman, DO;  [facility];  Service: Endoscopy;  Laterality: N/A;  ? FINGER SURGERY    ? left ring  ? INCISION AND DRAINAGE OF WOUND Left 04/20/2016  ? Procedure: LEFT THIGH DEBRIDEMENT;  Surgeon: Mickeal Skinner, MD;  Location: [facility];  Service: General;  Laterality: Left;  ? MALONEY DILATION N/A 12/08/2016  ? Procedure: MALONEY DILATION;  Surgeon: Daneil Dolin, MD;  [facility];  Service: Endoscopy;  Laterality: N/A;  ? POLYPECTOMY  12/08/2016  ? Procedure: POLYPECTOMY;  Surgeon: Manus Rudd  M, MD;  [facility];  Service: Endoscopy;;  colon  ? POLYPECTOMY  12/25/2020  ? Procedure: POLYPECTOMY;  Surgeon: Eloise Harman, DO;  [facility];  Service: Endoscopy;;  ? ? ?Current Outpatient Medications  ?Medication Sig Dispense Refill  ? apixaban (ELIQUIS) 5 MG TABS tablet Take  1 tablet (5 mg total) by mouth 2 (two) times daily. 60 tablet 1  ? atorvastatin (LIPITOR) 20 MG tablet Take 20 mg by mouth daily.    ? cephALEXin (KEFLEX) 500 MG capsule Take 1 capsule (500 mg total) by mouth 3 (three) times daily. 21 capsule 0  ? dapagliflozin propanediol (FARXIGA) 10 MG TABS tablet Take 10 mg by mouth daily.    ? diltiazem (CARDIZEM CD) 360 MG 24 hr capsule Take 1 capsule (360 mg total) by mouth daily. 30 capsule 1  ? furosemide (LASIX) 20 MG tablet Take 1 tablet (20 mg total) by mouth daily. 90 tablet 3  ? hydrALAZINE (APRESOLINE) 50 MG tablet Take 50 mg by mouth 2 (two) times daily.    ? losartan (COZAAR) 100 MG tablet Take 1 tablet (100 mg total) by mouth daily. 90 tablet 3  ? metFORMIN (GLUCOPHAGE) 500 MG tablet Take 500 mg by mouth 2 (two) times daily with a meal.    ? Multiple Vitamins-Minerals (MULTIVITAMIN WITH MINERALS) tablet Take 1 tablet by mouth daily.    ? naproxen (NAPROSYN) 500 MG tablet Take 500 mg by mouth 2 (two) times daily as needed.    ? oxymetazoline (AFRIN) 0.05 % nasal spray Place 1 spray into both nostrils 2 (two) times daily as needed for congestion.    ? pantoprazole (PROTONIX) 40 MG tablet Take 1 tablet (40 mg total) by mouth 2 (two) times daily before a meal. 30 minutes before breakfast 180 tablet 1  ? polyethylene glycol-electrolytes (TRILYTE) 420 g solution Take 4,000 mLs by mouth as directed. 4000 mL 0  ? TRESIBA FLEXTOUCH 100 UNIT/ML FlexTouch Pen Inject 40 Units into the skin at bedtime.    ? vitamin C (ASCORBIC ACID) 500 MG tablet Take 500 mg by mouth daily.    ? ?No current facility-administered medications for this visit.  ? ? ?Allergies as of 05/27/2021  ? (No Known Allergies)  ? ? ?Family History  ?Problem Relation Age of Onset  ? Heart failure Mother   ? Hypertension Mother   ? Hypertension Father   ? Stroke Father   ? Prostate cancer Father   ? Stroke Brother   ? Prostate cancer Brother   ? Colon cancer Neg Hx   ? Colon polyps Neg Hx   ? ? ?Social History   ? ?Socioeconomic History  ? Marital status: Widowed  ?  Spouse name: Not on file  ? Number of children: Not on file  ? Years of education: Not on file  ? Highest education level: Not on file  ?Occupational History  ? Occupation: Security  ?  Employer: [employer]  ?Tobacco Use  ? Smoking status: Some Days  ?  Types: Cigars  ?  Last attempt to quit: 07/08/2018  ?  Years since quitting: 2.8  ? Smokeless tobacco: Never  ? Tobacco comments:  ?  smoked 2-3 cigars daily  ?Vaping Use  ? Vaping Use: Never used  ?Substance and Sexual Activity  ? Alcohol use: Yes  ?  Comment: occasional shot of liquour every now and then   ? Drug use: No  ? Sexual activity: Yes  ?Other Topics Concern  ?  Not on file  ?Social History Narrative  ? Not on file  ? ?Social Determinants of Health  ? ?Financial Resource Strain: Not on file  ?Food Insecurity: Not on file  ?Transportation Needs: Not on file  ?Physical Activity: Not on file  ?Stress: Not on file  ?Social Connections: Not on file  ? ? ?Review of Systems: ?Gen: Denies fever, chills, cold or flulike symptoms, presyncope, syncope. ?CV: Denies chest pain, palpitations. ?Resp: Denies dyspnea or cough. ?GI: See HPI ?Heme: See HPI ? ?Physical Exam: ?There were no vitals taken for this visit. ?General:   Alert and oriented. No distress noted. Pleasant and cooperative.  ?Head:  Normocephalic and atraumatic. ?Eyes:  Conjuctiva clear without scleral icterus. ?Heart:  S1, S2 present without murmurs appreciated. ?Lungs:  Clear to auscultation bilaterally. No wheezes, rales, or rhonchi. No distress.  ?Abdomen:  +BS, soft, non-tender and non-distended. No rebound or guarding. No HSM or masses noted. ?Msk:  Symmetrical without gross deformities. Normal posture. ?Extremities:  Without edema. ?Neurologic:  Alert and  oriented x4 ?Psych: Normal mood and affect.  ? ? ?Assessment: ? ? ? ?Plan: ?*** ? ? ?Aliene Altes, PA-C ?[facility] ?05/27/2021 ? ?

## 2021-05-27 ENCOUNTER — Ambulatory Visit: Payer: Medicare PPO | Admitting: Gastroenterology

## 2021-05-27 ENCOUNTER — Encounter: Payer: Self-pay | Admitting: Internal Medicine

## 2021-06-15 ENCOUNTER — Encounter: Payer: Self-pay | Admitting: Physician Assistant

## 2021-06-15 NOTE — Progress Notes (Addendum)
? ?Cardiology Office Note   ? ?Date:  06/16/2021  ? ?ID:  Kevin Goodman, DOB [birth year], MRN [id number] ? ?PCP:  Kevin Meiers, MD  ?Cardiologist:  Kevin Dolly, MD  ?Electrophysiologist:  None  ? ?Chief Complaint: f/u atrial flutter, fatigue, DOE ? ?History of Present Illness:  ? ?Kevin Goodman is a 71 y.o. male with history of paroxysmal atrial fib/flutter (initially dx in 2013 with recurrence of atrial flutter 06/2020 in setting of Covid s/p DCCV 07/2020, HTN, DM, CKD stage 3a, OSA (not on CPAP), chronic edema, mild pulmonary HTN by echo, morbid obesity who is seen for follow-up today, last OV 09/2020. Last echo 06/2020 showed EF 55-60%, moderate LVH, mild pulm HTN. ? ?He is seen back for follow-up today overall feeling OK except HR is in the 140s with EKG showing recurrent rapid atrial flutter. He endorses some generalized fatigue and DOE though attributes this to working late shift and not getting much sleep. No overt palpitations, chest pain or dizziness. He reports chronic LLE>RLE which appears quite tense today. This began after an orthopedic surgery a few years ago. He does report occasionally missing his Eliquis at times but reports compliance with his diltiazem. His Eliquis rx is out of date, last filled for 90 days in 12/2020 though has a few tablets left. Diltiazem was last filled in 03/2021. Wilder Glade is missing from his medication bottles as well and he is unsure why. Regarding OSA, he reports his PCP ordered a sleep study for him 2 weeks ago but he as not heard anything back about this yet. ? ?Labwork independently reviewed: ?12/2020 K 4.3, Cr 1.20 ?07/2020 A1C 7.9,  WBC 14, Hgb 13.2, plt wnl ?06/2020 TSH wnl ? ?Cardiology Studies:  ? ?Studies reviewed are outlined and summarized above. Reports included below if pertinent.  ? ?2d echo 06/2020 ? ? 1. Left ventricular ejection fraction, by estimation, is 55 to 60%. The  ?left ventricle has normal function. The left ventricle has no regional   ?wall motion abnormalities. There is moderate left ventricular hypertrophy.  ?Left ventricular diastolic  ?parameters are indeterminate.  ? 2. Right ventricular systolic function is normal. The right ventricular  ?size is normal.  ? 3. The mitral valve is normal in structure. No evidence of mitral valve  ?regurgitation. No evidence of mitral stenosis.  ? 4. The aortic valve is tricuspid. There is mild calcification of the  ?aortic valve. There is mild thickening of the aortic valve. Aortic valve  ?regurgitation is not visualized. No aortic stenosis is present.  ? 5. Mild pulmonary HTN, PASP is 31 mmHg.  ? 6. The inferior vena cava is dilated in size with >50% respiratory  ?variability, suggesting right atrial pressure of 8 mmHg.   ? ? ?Past Medical History:  ?Diagnosis Date  ? Atrial fibrillation ([place])   ? a. initialy occurring in 2013  b. recurrence in 06/2020 in the setting of COVID-19 --> s/p DCCV in 07/2020  ? Chronic kidney disease, stage 3a ([place])   ? Diabetes mellitus, type 2 ([place])   ? GERD (gastroesophageal reflux disease)   ? Gout   ? Hypertension   ? Mild pulmonary hypertension ([place])   ? Morbid obesity ([place])   ? OSA (obstructive sleep apnea)   ? Paroxysmal atrial flutter ([place])   ? Sleep apnea   ? ? ?Past Surgical History:  ?Procedure Laterality Date  ? BALLOON DILATION N/A 12/25/2020  ? Procedure: BALLOON DILATION;  Surgeon: Eloise Harman, DO;  [facility];  Service: Endoscopy;  Laterality: N/A;  ? BIOPSY  12/25/2020  ? Procedure: BIOPSY;  Surgeon: Eloise Harman, DO;  [facility];  Service: Endoscopy;;  ? CARDIOVERSION N/A 07/09/2020  ? Procedure: CARDIOVERSION;  Surgeon: Arnoldo Lenis, MD;  [facility];  Service: Endoscopy;  Laterality: N/A;  ? COLONOSCOPY N/A 12/08/2016  ? sigmoid and descending colon diverticula, four semi-pedunculated polyps in descending and ascendign colon, few polyps in descending and ascending colon (tubular adenomas). 3 year surveillance.   ?  COLONOSCOPY WITH PROPOFOL N/A 12/25/2020  ? Procedure: COLONOSCOPY WITH PROPOFOL;  Surgeon: Eloise Harman, DO;  [facility];  Service: Endoscopy;  Laterality: N/A;  9:30am  ? ESOPHAGOGASTRODUODENOSCOPY N/A 12/08/2016  ? normal esophagus s/p empiric dilation, small hiatal hernia.   ? ESOPHAGOGASTRODUODENOSCOPY (EGD) WITH PROPOFOL N/A 12/25/2020  ? Procedure: ESOPHAGOGASTRODUODENOSCOPY (EGD) WITH PROPOFOL;  Surgeon: Eloise Harman, DO;  [facility];  Service: Endoscopy;  Laterality: N/A;  ? FINGER SURGERY    ? left ring  ? INCISION AND DRAINAGE OF WOUND Left 04/20/2016  ? Procedure: LEFT THIGH DEBRIDEMENT;  Surgeon: Mickeal Skinner, MD;  Location: [facility];  Service: General;  Laterality: Left;  ? MALONEY DILATION N/A 12/08/2016  ? Procedure: MALONEY DILATION;  Surgeon: Daneil Dolin, MD;  [facility];  Service: Endoscopy;  Laterality: N/A;  ? POLYPECTOMY  12/08/2016  ? Procedure: POLYPECTOMY;  Surgeon: Daneil Dolin, MD;  [facility];  Service: Endoscopy;;  colon  ? POLYPECTOMY  12/25/2020  ? Procedure: POLYPECTOMY;  Surgeon: Eloise Harman, DO;  [facility];  Service: Endoscopy;;  ? ? ?Current Medications: ?Current Meds  ?Medication Sig  ? apixaban (ELIQUIS) 5 MG TABS tablet Take 1 tablet (5 mg total) by mouth 2 (two) times daily.  ? atorvastatin (LIPITOR) 20 MG tablet Take 20 mg by mouth daily.  ? diltiazem (CARDIZEM CD) 360 MG 24 hr capsule Take 1 capsule (360 mg total) by mouth daily.  ? furosemide (LASIX) 20 MG tablet Take 1 tablet (20 mg total) by mouth daily.  ? hydrALAZINE (APRESOLINE) 100 MG tablet Take 100 mg by mouth 2 (two) times daily.  ? losartan (COZAAR) 100 MG tablet Take 1 tablet (100 mg total) by mouth daily.  ? metFORMIN (GLUCOPHAGE) 500 MG tablet Take 500 mg by mouth 2 (two) times daily with a meal.  ? Multiple Vitamins-Minerals (MULTIVITAMIN WITH MINERALS) tablet Take 1 tablet by mouth daily.  ? naproxen (NAPROSYN) 500 MG  tablet Take 500 mg by mouth 2 (two) times daily as needed.  ? oxymetazoline (AFRIN) 0.05 % nasal spray Place 1 spray into both nostrils 2 (two) times daily as needed for congestion.  ? pantoprazole (PROTONIX) 40 MG tablet Take 1 tablet (40 mg total) by mouth 2 (two) times daily before a meal. 30 minutes before breakfast  ? TRESIBA FLEXTOUCH 100 UNIT/ML FlexTouch Pen Inject 40 Units into the skin at bedtime.  ? vitamin C (ASCORBIC ACID) 500 MG tablet Take 500 mg by mouth daily.  ? [DISCONTINUED] dapagliflozin propanediol (FARXIGA) 10 MG TABS tablet Take 10 mg by mouth daily.  ? [DISCONTINUED] hydrALAZINE (APRESOLINE) 50 MG tablet Take 100 mg by mouth 2 (two) times daily.  ? ? ? ?Allergies:   Patient has no known allergies.  ? ?Social History  ? ?Socioeconomic History  ? Marital status: Widowed  ?  Spouse name: Not on file  ? Number of children:  Not on file  ? Years of education: Not on file  ? Highest education level: Not on file  ?Occupational History  ? Occupation: Security  ?  Employer: [employer]  ?Tobacco Use  ? Smoking status: Some Days  ?  Types: Cigars  ?  Last attempt to quit: 07/08/2018  ?  Years since quitting: 2.9  ? Smokeless tobacco: Never  ? Tobacco comments:  ?  smoked 2-3 cigars daily  ?Vaping Use  ? Vaping Use: Never used  ?Substance and Sexual Activity  ? Alcohol use: Yes  ?  Comment: occasional shot of liquour every now and then   ? Drug use: No  ? Sexual activity: Yes  ?Other Topics Concern  ? Not on file  ?Social History Narrative  ? Not on file  ? ?Social Determinants of Health  ? ?Financial Resource Strain: Not on file  ?Food Insecurity: Not on file  ?Transportation Needs: Not on file  ?Physical Activity: Not on file  ?Stress: Not on file  ?Social Connections: Not on file  ?  ? ?Family History:  ?The patient's family history includes Heart failure in his mother; Hypertension in his father and mother; Prostate cancer in his brother and father; Stroke in his brother and father. There is no  history of Colon cancer or Colon polyps. ? ?ROS:   ?Please see the history of present illness.  ?All other systems are reviewed and otherwise negative.  ? ? ?EKG(s)/Additional Labs  ? ?EKG:  EKG is ordered

## 2021-06-16 ENCOUNTER — Ambulatory Visit: Payer: Medicare PPO | Admitting: Physician Assistant

## 2021-06-16 ENCOUNTER — Encounter: Payer: Self-pay | Admitting: Physician Assistant

## 2021-06-16 ENCOUNTER — Other Ambulatory Visit: Payer: Self-pay

## 2021-06-16 ENCOUNTER — Encounter (HOSPITAL_COMMUNITY): Payer: Self-pay

## 2021-06-16 ENCOUNTER — Inpatient Hospital Stay (HOSPITAL_COMMUNITY)
Admission: EM | Admit: 2021-06-16 | Discharge: 2021-06-19 | DRG: 309 | Disposition: A | Discharge status: Home / Self Care | Payer: Medicare PPO | Attending: Family Medicine | Admitting: Family Medicine

## 2021-06-16 ENCOUNTER — Emergency Department (HOSPITAL_COMMUNITY): Payer: Medicare PPO

## 2021-06-16 VITALS — BP 110/64 | HR 146 | Ht 74.0 in | Wt 381.0 lb

## 2021-06-16 DIAGNOSIS — I4892 Unspecified atrial flutter: Secondary | ICD-10-CM | POA: Diagnosis not present

## 2021-06-16 DIAGNOSIS — I480 Paroxysmal atrial fibrillation: Secondary | ICD-10-CM | POA: Diagnosis not present

## 2021-06-16 DIAGNOSIS — I482 Chronic atrial fibrillation, unspecified: Secondary | ICD-10-CM | POA: Diagnosis present

## 2021-06-16 DIAGNOSIS — Z7901 Long term (current) use of anticoagulants: Secondary | ICD-10-CM | POA: Diagnosis not present

## 2021-06-16 DIAGNOSIS — G4733 Obstructive sleep apnea (adult) (pediatric): Secondary | ICD-10-CM | POA: Diagnosis present

## 2021-06-16 DIAGNOSIS — I10 Essential (primary) hypertension: Secondary | ICD-10-CM | POA: Diagnosis not present

## 2021-06-16 DIAGNOSIS — N1831 Chronic kidney disease, stage 3a: Secondary | ICD-10-CM

## 2021-06-16 DIAGNOSIS — Z7984 Long term (current) use of oral hypoglycemic drugs: Secondary | ICD-10-CM

## 2021-06-16 DIAGNOSIS — Z6841 Body Mass Index (BMI) 40.0 and over, adult: Secondary | ICD-10-CM

## 2021-06-16 DIAGNOSIS — I5022 Chronic systolic (congestive) heart failure: Secondary | ICD-10-CM | POA: Diagnosis present

## 2021-06-16 DIAGNOSIS — E1165 Type 2 diabetes mellitus with hyperglycemia: Secondary | ICD-10-CM | POA: Diagnosis not present

## 2021-06-16 DIAGNOSIS — F17290 Nicotine dependence, other tobacco product, uncomplicated: Secondary | ICD-10-CM | POA: Diagnosis present

## 2021-06-16 DIAGNOSIS — E785 Hyperlipidemia, unspecified: Secondary | ICD-10-CM | POA: Diagnosis present

## 2021-06-16 DIAGNOSIS — I272 Pulmonary hypertension, unspecified: Secondary | ICD-10-CM | POA: Diagnosis present

## 2021-06-16 DIAGNOSIS — Z8616 Personal history of COVID-19: Secondary | ICD-10-CM | POA: Diagnosis not present

## 2021-06-16 DIAGNOSIS — I890 Lymphedema, not elsewhere classified: Secondary | ICD-10-CM | POA: Diagnosis present

## 2021-06-16 DIAGNOSIS — I4811 Longstanding persistent atrial fibrillation: Secondary | ICD-10-CM

## 2021-06-16 DIAGNOSIS — K219 Gastro-esophageal reflux disease without esophagitis: Secondary | ICD-10-CM | POA: Diagnosis present

## 2021-06-16 DIAGNOSIS — Z794 Long term (current) use of insulin: Secondary | ICD-10-CM | POA: Diagnosis not present

## 2021-06-16 DIAGNOSIS — M109 Gout, unspecified: Secondary | ICD-10-CM | POA: Diagnosis present

## 2021-06-16 DIAGNOSIS — Z79899 Other long term (current) drug therapy: Secondary | ICD-10-CM | POA: Diagnosis not present

## 2021-06-16 DIAGNOSIS — Z8249 Family history of ischemic heart disease and other diseases of the circulatory system: Secondary | ICD-10-CM

## 2021-06-16 DIAGNOSIS — E119 Type 2 diabetes mellitus without complications: Secondary | ICD-10-CM

## 2021-06-16 DIAGNOSIS — E1122 Type 2 diabetes mellitus with diabetic chronic kidney disease: Secondary | ICD-10-CM | POA: Diagnosis not present

## 2021-06-16 DIAGNOSIS — I130 Hypertensive heart and chronic kidney disease with heart failure and stage 1 through stage 4 chronic kidney disease, or unspecified chronic kidney disease: Secondary | ICD-10-CM | POA: Diagnosis not present

## 2021-06-16 DIAGNOSIS — E66813 Obesity, class 3: Secondary | ICD-10-CM | POA: Diagnosis present

## 2021-06-16 DIAGNOSIS — I4891 Unspecified atrial fibrillation: Secondary | ICD-10-CM | POA: Diagnosis not present

## 2021-06-16 LAB — MAGNESIUM: Magnesium: 2 mg/dL (ref 1.7–2.4)

## 2021-06-16 LAB — BASIC METABOLIC PANEL
Anion gap: 8 (ref 5–15)
BUN: 29 mg/dL — ABNORMAL HIGH (ref 8–23)
CO2: 22 mmol/L (ref 22–32)
Calcium: 9.5 mg/dL (ref 8.9–10.3)
Chloride: 108 mmol/L (ref 98–111)
Creatinine, Ser: 1.38 mg/dL — ABNORMAL HIGH (ref 0.61–1.24)
GFR, Estimated: 56 mL/min — ABNORMAL LOW (ref >60)
Glucose, Bld: 94 mg/dL (ref 70–99)
Potassium: 4.3 mmol/L (ref 3.5–5.1)
Sodium: 138 mmol/L (ref 135–145)

## 2021-06-16 LAB — CBC
HCT: 43.5 % (ref 39.0–52.0)
Hemoglobin: 14 g/dL (ref 13.0–17.0)
MCH: 29.4 pg (ref 26.0–34.0)
MCHC: 32.2 g/dL (ref 30.0–36.0)
MCV: 91.2 fL (ref 80.0–100.0)
Platelets: 344 10*3/uL (ref 150–400)
RBC: 4.77 MIL/uL (ref 4.22–5.81)
RDW: 15.4 % (ref 11.5–15.5)
WBC: 13.4 10*3/uL — ABNORMAL HIGH (ref 4.0–10.5)
nRBC: 0 % (ref 0.0–0.2)

## 2021-06-16 MED ORDER — ATORVASTATIN CALCIUM 20 MG PO TABS
20.0000 mg | ORAL_TABLET | Freq: Every day | ORAL | Status: DC
  Administered 2021-06-17 – 2021-06-19 (×2): 20 mg via ORAL
  Filled 2021-06-16: qty 2
  Filled 2021-06-16 (×2): qty 1

## 2021-06-16 MED ORDER — DILTIAZEM HCL ER COATED BEADS 180 MG PO CP24
360.0000 mg | ORAL_CAPSULE | Freq: Every day | ORAL | Status: DC
  Administered 2021-06-17: 360 mg via ORAL
  Filled 2021-06-16: qty 2

## 2021-06-16 MED ORDER — DILTIAZEM HCL-DEXTROSE 125-5 MG/125ML-% IV SOLN (PREMIX)
5.0000 mg/h | INTRAVENOUS | Status: DC
  Administered 2021-06-16 – 2021-06-17 (×2): 5 mg/h via INTRAVENOUS
  Filled 2021-06-16 (×2): qty 125

## 2021-06-16 MED ORDER — DILTIAZEM LOAD VIA INFUSION
20.0000 mg | Freq: Once | INTRAVENOUS | Status: AC
  Administered 2021-06-16: 20 mg via INTRAVENOUS
  Filled 2021-06-16: qty 20

## 2021-06-16 MED ORDER — INSULIN ASPART 100 UNIT/ML IJ SOLN
3.0000 [IU] | Freq: Three times a day (TID) | INTRAMUSCULAR | Status: DC
  Administered 2021-06-17 – 2021-06-19 (×5): 3 [IU] via SUBCUTANEOUS
  Filled 2021-06-16: qty 1

## 2021-06-16 MED ORDER — LOSARTAN POTASSIUM 50 MG PO TABS
100.0000 mg | ORAL_TABLET | Freq: Every day | ORAL | Status: DC
  Administered 2021-06-17 – 2021-06-19 (×3): 100 mg via ORAL
  Filled 2021-06-16: qty 2
  Filled 2021-06-16: qty 4
  Filled 2021-06-16: qty 2

## 2021-06-16 MED ORDER — POTASSIUM CHLORIDE IN NACL 20-0.9 MEQ/L-% IV SOLN
INTRAVENOUS | Status: DC
  Filled 2021-06-16: qty 1000

## 2021-06-16 MED ORDER — FUROSEMIDE 20 MG PO TABS
20.0000 mg | ORAL_TABLET | Freq: Every day | ORAL | Status: DC
Start: 2021-06-17 — End: 2021-06-19
  Administered 2021-06-17 – 2021-06-19 (×2): 20 mg via ORAL
  Filled 2021-06-16 (×3): qty 1

## 2021-06-16 MED ORDER — INSULIN DEGLUDEC 100 UNIT/ML ~~LOC~~ SOPN: 40.0000 [IU] | PEN_INJECTOR | Freq: Every day | SUBCUTANEOUS | Status: DC

## 2021-06-16 MED ORDER — INSULIN ASPART 100 UNIT/ML IJ SOLN
0.0000 [IU] | Freq: Three times a day (TID) | INTRAMUSCULAR | Status: DC
  Administered 2021-06-17: 1 [IU] via SUBCUTANEOUS
  Administered 2021-06-17 (×2): 2 [IU] via SUBCUTANEOUS
  Filled 2021-06-16: qty 1

## 2021-06-16 MED ORDER — ZOLPIDEM TARTRATE 5 MG PO TABS
5.0000 mg | ORAL_TABLET | Freq: Every evening | ORAL | Status: DC | PRN
  Administered 2021-06-17: 5 mg via ORAL
  Filled 2021-06-16: qty 1

## 2021-06-16 MED ORDER — HYDRALAZINE HCL 25 MG PO TABS
100.0000 mg | ORAL_TABLET | Freq: Two times a day (BID) | ORAL | Status: DC
  Administered 2021-06-16 – 2021-06-19 (×6): 100 mg via ORAL
  Filled 2021-06-16 (×6): qty 4

## 2021-06-16 MED ORDER — PANTOPRAZOLE SODIUM 40 MG PO TBEC
40.0000 mg | DELAYED_RELEASE_TABLET | Freq: Two times a day (BID) | ORAL | Status: DC
  Administered 2021-06-17 – 2021-06-19 (×4): 40 mg via ORAL
  Filled 2021-06-16 (×5): qty 1

## 2021-06-16 MED ORDER — ACETAMINOPHEN 325 MG PO TABS: 650.0000 mg | ORAL_TABLET | ORAL | Status: DC | PRN

## 2021-06-16 MED ORDER — ONDANSETRON HCL 4 MG/2ML IJ SOLN: 4.0000 mg | Freq: Four times a day (QID) | INTRAMUSCULAR | Status: DC | PRN

## 2021-06-16 MED ORDER — APIXABAN 5 MG PO TABS
5.0000 mg | ORAL_TABLET | Freq: Two times a day (BID) | ORAL | Status: DC
Start: 2021-06-16 — End: 2021-06-19
  Administered 2021-06-16 – 2021-06-19 (×6): 5 mg via ORAL
  Filled 2021-06-16 (×7): qty 1

## 2021-06-16 MED ORDER — INSULIN GLARGINE-YFGN 100 UNIT/ML ~~LOC~~ SOLN
40.0000 [IU] | Freq: Every day | SUBCUTANEOUS | Status: DC
  Administered 2021-06-16 – 2021-06-18 (×3): 40 [IU] via SUBCUTANEOUS
  Filled 2021-06-16 (×4): qty 0.4

## 2021-06-16 NOTE — ED Notes (Signed)
Pt given meal tray.

## 2021-06-16 NOTE — Consult Note (Addendum)
?  ?Cardiology Office Note -> Serves as Patient's Consultation Note  ?  ?Date:  06/16/2021  ?  ?ID:  Kevin Goodman, DOB Feb 27, 1951, MRN [id number] ?  ?PCP:  Carrolyn Meiers, MD  ?Cardiologist:  Carlyle Dolly, MD  ?Electrophysiologist:  None  ?  ?Chief Complaint: f/u atrial flutter, fatigue, DOE ?  ?History of Present Illness:  ?  ?Kevin Goodman is a 71 y.o. male with history of paroxysmal atrial fib/flutter (initially dx in 2013 with recurrence of atrial flutter 06/2020 in setting of Covid s/p DCCV 07/2020, HTN, DM, CKD stage 3a, OSA (not on CPAP), chronic edema, mild pulmonary HTN by echo, morbid obesity who is seen for follow-up today, last OV 09/2020. Last echo 06/2020 showed EF 55-60%, moderate LVH, mild pulm HTN. ?  ?He is seen back for follow-up today overall feeling OK except HR is in the 140s with EKG showing recurrent rapid atrial flutter. He endorses some generalized fatigue and DOE though attributes this to working late shift and not getting much sleep. No overt palpitations, chest pain or dizziness. He reports chronic LLE>RLE which appears quite tense today. This began after an orthopedic surgery a few years ago. He does report occasionally missing his Eliquis at times but reports compliance with his diltiazem. His Eliquis rx is out of date, last filled for 90 days in 12/2020 though has a few tablets left. Diltiazem was last filled in 03/2021. Wilder Glade is missing from his medication bottles as well and he is unsure why. Regarding OSA, he reports his PCP ordered a sleep study for him 2 weeks ago but he as not heard anything back about this yet. ?  ?Labwork independently reviewed: ?12/2020 K 4.3, Cr 1.20 ?07/2020 A1C 7.9,  WBC 14, Hgb 13.2, plt wnl ?06/2020 TSH wnl ?  ?Cardiology Studies:  ?  ?Studies reviewed are outlined and summarized above. Reports included below if pertinent.  ?  ?2d echo 06/2020 ? ? 1. Left ventricular ejection fraction, by estimation, is 55 to 60%. The  ?left ventricle has  normal function. The left ventricle has no regional  ?wall motion abnormalities. There is moderate left ventricular hypertrophy.  ?Left ventricular diastolic  ?parameters are indeterminate.  ? 2. Right ventricular systolic function is normal. The right ventricular  ?size is normal.  ? 3. The mitral valve is normal in structure. No evidence of mitral valve  ?regurgitation. No evidence of mitral stenosis.  ? 4. The aortic valve is tricuspid. There is mild calcification of the  ?aortic valve. There is mild thickening of the aortic valve. Aortic valve  ?regurgitation is not visualized. No aortic stenosis is present.  ? 5. Mild pulmonary HTN, PASP is 31 mmHg.  ? 6. The inferior vena cava is dilated in size with >50% respiratory  ?variability, suggesting right atrial pressure of 8 mmHg.   ?  ?  ?    ?Past Medical History:  ?Diagnosis Date  ? Atrial fibrillation ([place])    ?  a. initialy occurring in 2013  b. recurrence in 06/2020 in the setting of COVID-19 --> s/p DCCV in 07/2020  ? Chronic kidney disease, stage 3a ([place])    ? Diabetes mellitus, type 2 ([place])    ? GERD (gastroesophageal reflux disease)    ? Gout    ? Hypertension    ? Mild pulmonary hypertension ([place])    ? Morbid obesity ([place])    ? OSA (obstructive sleep apnea)    ? Paroxysmal atrial flutter ([place])    ?  Sleep apnea    ?  ?  ?     ?Past Surgical History:  ?Procedure Laterality Date  ? BALLOON DILATION N/A 12/25/2020  ?  Procedure: BALLOON DILATION;  Surgeon: Eloise Harman, DO;  [facility];  Service: Endoscopy;  Laterality: N/A;  ? BIOPSY   12/25/2020  ?  Procedure: BIOPSY;  Surgeon: Eloise Harman, DO;  [facility];  Service: Endoscopy;;  ? CARDIOVERSION N/A 07/09/2020  ?  Procedure: CARDIOVERSION;  Surgeon: Arnoldo Lenis, MD;  [facility];  Service: Endoscopy;  Laterality: N/A;  ? COLONOSCOPY N/A 12/08/2016  ?  sigmoid and descending colon diverticula, four semi-pedunculated polyps in descending and ascendign colon, few  polyps in descending and ascending colon (tubular adenomas). 3 year surveillance.   ? COLONOSCOPY WITH PROPOFOL N/A 12/25/2020  ?  Procedure: COLONOSCOPY WITH PROPOFOL;  Surgeon: Eloise Harman, DO;  [facility];  Service: Endoscopy;  Laterality: N/A;  9:30am  ? ESOPHAGOGASTRODUODENOSCOPY N/A 12/08/2016  ?  normal esophagus s/p empiric dilation, small hiatal hernia.   ? ESOPHAGOGASTRODUODENOSCOPY (EGD) WITH PROPOFOL N/A 12/25/2020  ?  Procedure: ESOPHAGOGASTRODUODENOSCOPY (EGD) WITH PROPOFOL;  Surgeon: Eloise Harman, DO;  [facility];  Service: Endoscopy;  Laterality: N/A;  ? FINGER SURGERY      ?  left ring  ? INCISION AND DRAINAGE OF WOUND Left 04/20/2016  ?  Procedure: LEFT THIGH DEBRIDEMENT;  Surgeon: Mickeal Skinner, MD;  Location: [facility];  Service: General;  Laterality: Left;  ? MALONEY DILATION N/A 12/08/2016  ?  Procedure: MALONEY DILATION;  Surgeon: Daneil Dolin, MD;  [facility];  Service: Endoscopy;  Laterality: N/A;  ? POLYPECTOMY   12/08/2016  ?  Procedure: POLYPECTOMY;  Surgeon: Daneil Dolin, MD;  [facility];  Service: Endoscopy;;  colon  ? POLYPECTOMY   12/25/2020  ?  Procedure: POLYPECTOMY;  Surgeon: Eloise Harman, DO;  [facility];  Service: Endoscopy;;  ?  ?  ?Current Medications: ?Active Medications  ?    ?Current Meds  ?Medication Sig  ? apixaban (ELIQUIS) 5 MG TABS tablet Take 1 tablet (5 mg total) by mouth 2 (two) times daily.  ? atorvastatin (LIPITOR) 20 MG tablet Take 20 mg by mouth daily.  ? diltiazem (CARDIZEM CD) 360 MG 24 hr capsule Take 1 capsule (360 mg total) by mouth daily.  ? furosemide (LASIX) 20 MG tablet Take 1 tablet (20 mg total) by mouth daily.  ? hydrALAZINE (APRESOLINE) 100 MG tablet Take 100 mg by mouth 2 (two) times daily.  ? losartan (COZAAR) 100 MG tablet Take 1 tablet (100 mg total) by mouth daily.  ? metFORMIN (GLUCOPHAGE) 500 MG tablet Take 500 mg by mouth 2 (two) times daily with a meal.  ?  Multiple Vitamins-Minerals (MULTIVITAMIN WITH MINERALS) tablet Take 1 tablet by mouth daily.  ? naproxen (NAPROSYN) 500 MG tablet Take 500 mg by mouth 2 (two) times daily as needed.  ? oxymetazoline (AFRIN) 0.05 % nasal spray Place 1 spray into both nostrils 2 (two) times daily as needed for congestion.  ? pantoprazole (PROTONIX) 40 MG tablet Take 1 tablet (40 mg total) by mouth 2 (two) times daily before a meal. 30 minutes before breakfast  ? TRESIBA FLEXTOUCH 100 UNIT/ML FlexTouch Pen Inject 40 Units into the skin at bedtime.  ? vitamin C (ASCORBIC ACID) 500 MG tablet Take 500 mg by mouth daily.  ? [DISCONTINUED] dapagliflozin propanediol (  FARXIGA) 10 MG TABS tablet Take 10 mg by mouth daily.  ? [DISCONTINUED] hydrALAZINE (APRESOLINE) 50 MG tablet Take 100 mg by mouth 2 (two) times daily.  ?  ?  ?  ?  ?Allergies:   Patient has no known allergies.  ?  ?Social History  ?  ?     ?Socioeconomic History  ? Marital status: Widowed  ?    Spouse name: Not on file  ? Number of children: Not on file  ? Years of education: Not on file  ? Highest education level: Not on file  ?Occupational History  ? Occupation: Security  ?    Employer: [employer]  ?Tobacco Use  ? Smoking status: Some Days  ?    Types: Cigars  ?    Last attempt to quit: 07/08/2018  ?    Years since quitting: 2.9  ? Smokeless tobacco: Never  ? Tobacco comments:  ?    smoked 2-3 cigars daily  ?Vaping Use  ? Vaping Use: Never used  ?Substance and Sexual Activity  ? Alcohol use: Yes  ?    Comment: occasional shot of liquour every now and then   ? Drug use: No  ? Sexual activity: Yes  ?Other Topics Concern  ? Not on file  ?Social History Narrative  ? Not on file  ?  ?Social Determinants of Health  ?  ?Financial Resource Strain: Not on file  ?Food Insecurity: Not on file  ?Transportation Needs: Not on file  ?Physical Activity: Not on file  ?Stress: Not on file  ?Social Connections: Not on file  ?  ?  ?Family History:  ?The patient's family history includes  Heart failure in his mother; Hypertension in his father and mother; Prostate cancer in his brother and father; Stroke in his brother and father. There is no history of Colon cancer or Colon polyps. ?  ?ROS:

## 2021-06-16 NOTE — ED Triage Notes (Signed)
Pt brought over from [facility] for Atrial Flutter with rate at 146. Pt denies chest pain, SOB, or dizziness at this time, states he feels great.  ?

## 2021-06-16 NOTE — ED Notes (Signed)
Pt given urinal.

## 2021-06-16 NOTE — ED Provider Notes (Signed)
?[facility] ?Provider Note ? ? ?CSN: [id number] ?Arrival date & time: 06/16/21  1513 ? ?  ? ?History ? ?Chief Complaint  ?Patient presents with  ? Tachycardia  ? ? ?Kevin Goodman is a 71 y.o. male. ? ?HPI ? ? Pt presents to the ED for evaluation of rapid atrial flutter.  Pt has a history of atrial fibrillation, htn, obesity, dm, lymphedema, atrial flutter who went to the cardiology clinic for a routine appt.  While at the visit pt was noted to be in rapid atrial flutter with a heart rate in the 140s.  Pt was not aware that his heart was racing.  He has not been short of breath.  No trouble with chest pain.  He has chronic lymphedema but has not noticed it any worse than usual.  Pt has not been compliant with his anticoagulants.  He was sent to the ED for rate control with plans on resuming his anticoagulation and admission to the hospital for stabilization. ? ?Home Medications ?Prior to Admission medications   ?Medication Sig Start Date End Date Taking? Authorizing Provider  ?apixaban (ELIQUIS) 5 MG TABS tablet Take 1 tablet (5 mg total) by mouth 2 (two) times daily. 06/14/20  Yes Tat, Shanon Brow, MD  ?atorvastatin (LIPITOR) 20 MG tablet Take 20 mg by mouth daily.   Yes [provider]  ?diltiazem (CARDIZEM CD) 360 MG 24 hr capsule Take 1 capsule (360 mg total) by mouth daily. 06/15/20  Yes Tat, Shanon Brow, MD  ?furosemide (LASIX) 20 MG tablet Take 1 tablet (20 mg total) by mouth daily. 07/03/20 06/16/21 Yes Strader, Fransisco Hertz, PA-C  ?hydrALAZINE (APRESOLINE) 100 MG tablet Take 100 mg by mouth 2 (two) times daily.   Yes [provider]  ?losartan (COZAAR) 100 MG tablet Take 1 tablet (100 mg total) by mouth daily. 10/27/20 06/16/21 Yes Strader, Fransisco Hertz, PA-C  ?metFORMIN (GLUCOPHAGE) 500 MG tablet Take 500 mg by mouth 2 (two) times daily with a meal.   Yes [provider]  ?naproxen (NAPROSYN) 500 MG tablet Take 500 mg by mouth 2 (two) times daily as needed.   Yes [provider]  ?oxymetazoline (AFRIN) 0.05 % nasal spray Place 1 spray into both nostrils 2 (two) times daily as needed for congestion.   Yes [provider]  ?pantoprazole (PROTONIX) 40 MG tablet Take 1 tablet (40 mg total) by mouth 2 (two) times daily before a meal. 30 minutes before breakfast 12/25/20 06/23/21 Yes Carver, Charles K, DO  ?TRESIBA FLEXTOUCH 100 UNIT/ML FlexTouch Pen Inject 40 Units into the skin daily. 05/06/20  Yes [provider]  ?   ? ?Allergies    ?Patient has no known allergies.   ? ?Review of Systems   ?Review of Systems  ?Constitutional:  Negative for fever.  ? ?Physical Exam ?Updated Vital Signs ?BP 132/75   Pulse 64   Temp 97.8 ?F (36.6 ?C) (Oral)   Resp 14   Ht 1.854 m ('6\' 1"'$ )   Wt (!) 172.8 kg   SpO2 97%   BMI 50.27 kg/m?  ?Physical Exam ?Vitals and nursing note reviewed.  ?Constitutional:   ?   General: He is not in acute distress. ?   Appearance: He is well-developed.  ?HENT:  ?   Head: Normocephalic and atraumatic.  ?   Right Ear: External ear normal.  ?   Left Ear: External ear normal.  ?Eyes:  ?   General: No scleral icterus.    ?  Right eye: No discharge.     ?   Left eye: No discharge.  ?   Conjunctiva/sclera: Conjunctivae normal.  ?Neck:  ?   Trachea: No tracheal deviation.  ?Cardiovascular:  ?   Rate and Rhythm: Regular rhythm. Tachycardia present.  ?Pulmonary:  ?   Effort: Pulmonary effort is normal. No respiratory distress.  ?   Breath sounds: Normal breath sounds. No stridor. No wheezing or rales.  ?Abdominal:  ?   General: Bowel sounds are normal. There is no distension.  ?   Palpations: Abdomen is soft.  ?   Tenderness: There is no abdominal tenderness. There is no guarding or rebound.  ?Musculoskeletal:     ?   General: No tenderness or deformity.  ?   Cervical back: Neck supple.  ?   Right lower leg: Edema present.  ?   Left lower leg: Edema present.  ?   Comments: Left greater than right, chronic per pt  ?Skin: ?   General: Skin is warm and dry.   ?   Findings: No rash.  ?Neurological:  ?   General: No focal deficit present.  ?   Mental Status: He is alert.  ?   Cranial Nerves: No cranial nerve deficit (no facial droop, extraocular movements intact, no slurred speech).  ?   Sensory: No sensory deficit.  ?   Motor: No abnormal muscle tone or seizure activity.  ?   Coordination: Coordination normal.  ?Psychiatric:     ?   Mood and Affect: Mood normal.  ? ? ?ED Results / Procedures / Treatments   ?Labs ?(all labs ordered are listed, but only abnormal results are displayed) ?Labs Reviewed  ?BASIC METABOLIC PANEL - Abnormal; Notable for the following components:  ?    Result Value  ? BUN 29 (*)   ? Creatinine, Ser 1.38 (*)   ? GFR, Estimated 56 (*)   ? All other components within normal limits  ?CBC - Abnormal; Notable for the following components:  ? WBC 13.4 (*)   ? All other components within normal limits  ?MAGNESIUM  ? ? ?EKG ?EKG Interpretation ? ?Date/Time:  Tuesday June 16 2021 15:33:25 EDT ?Ventricular Rate:  106 ?PR Interval:    ?QRS Duration: 101 ?QT Interval:  403 ?QTC Calculation: 477 ?R Axis:   220 ?Text Interpretation: Atrial flutter variable block Ventricular premature complex Consider right ventricular hypertrophy atrial flutter is new since last tracing Reconfirmed by Dorie Rank [phone]) on 06/16/2021 3:47:51 PM ? ?Radiology ?DG Chest Port 1 View ? ?Result Date: 06/16/2021 ?CLINICAL DATA:  Atrial flutter. EXAM: PORTABLE CHEST 1 VIEW COMPARISON:  AP chest 06/10/2020 FINDINGS: Cardiac silhouette and mediastinal contours are within normal limits. The lungs are clear. No pleural effusion or pneumothorax. No acute skeletal abnormality. IMPRESSION: No active disease. Electronically Signed   By: Yvonne Kendall M.D.   On: 06/16/2021 15:50   ? ?Procedures ?Procedures  ? ? ?Medications Ordered in ED ?Medications  ?apixaban (ELIQUIS) tablet 5 mg (5 mg Oral Given 06/16/21 1558)  ?diltiazem (CARDIZEM) 1 mg/mL load via infusion 20 mg (20 mg Intravenous Bolus from  Bag 06/16/21 1602)  ?  And  ?diltiazem (CARDIZEM) 125 mg in dextrose 5% 125 mL (1 mg/mL) infusion (5 mg/hr Intravenous Infusion Verify 06/16/21 1655)  ? ? ?ED Course/ Medical Decision Making/ A&P ?Clinical Course as of 06/16/21 1734  ?Tue Jun 16, 2021  ?1648 CBC(!) ?WBC elevated [JK]  ?7989 Basic metabolic panel(!) ?Cr elevated, similar to previous [JK]  ?  [place] 1 View ?Chest x-ray images and radiology report reviewed.  No acute findings [JK]  ?1723 Heart rate down into the 80s and 90s while on the Cardizem drip at the bedside [JK]  ?[place] Discussed with Dr Evangeline Gula regarding admission [JK]  ?  ?Clinical Course User Index ?[JK] Dorie Rank, MD  ? ?    CHA2DS2-VASc Score: 3 ?                   ?Medical Decision Making ?Amount and/or Complexity of Data Reviewed ?External Data Reviewed: notes. ?   Details: Note from outpatient cardiology visit reviewed. ?Labs: ordered. Decision-making details documented in ED Course. ?Radiology: ordered. Decision-making details documented in ED Course. ? ?Risk ?Prescription drug management. ?Decision regarding hospitalization. ? ? ?Patient presented to the ED from the cardiology clinic for rapid atrial flutter.  Patient was not having any trouble with any chest pain or shortness of breath.  Had not realized how fast his heart rate was.  Patient has not been compliant with his anticoagulants so is not a candidate for cardioversion at this time.  Patient was treated with IV Cardizem.  Heart rate has improved significantly.  Cardiology recommended admission to the hospital, cardiology would see the patient in the morning.  Discussed with Dr Evangeline Gula, hospitalist regarding admission. ? ? ? ? ? ? ? ?Final Clinical Impression(s) / ED Diagnoses ?Final diagnoses:  ?Atrial flutter with rapid ventricular response ([place])  ? ?  ?Dorie Rank, MD ?06/16/21 1735 ? ?

## 2021-06-16 NOTE — H&P (Signed)
?History and Physical  ? ? ?Kevin Goodman XNA:[id number] DOB: [birth year] DOA: 06/16/2021 ? ?PCP: Carrolyn Meiers, MD  ?Patient coming from: Cardiology office ? ?I have personally briefly reviewed patient's old medical records in [place] ? ?Chief Complaint: Rapid atrial flutter or an unknown period of time. ? ?HPI: Kevin Goodman is a 71 y.o. male with medical history significant of chronic atrial fibrillation, hypertension, obesity, diabetes mellitus type 2 without hyperglycemia, lymphedema, occasional episodes of atrial flutter who went to the cardiology clinic for his routine appointment today.  When vital signs were obtained patient was noted to have a pulse of 140.  An EKG was obtained and revealed atrial flutter with a heart rate in the 140s.  With a variable block.  He was not aware that his heart was racing.  He had no symptoms of palpitations, no shortness of breath, no troubles with chest pain and denies dizziness.  He states that his chronic lymphedema is unchanged from recent past.  He is supposed to be taking Eliquis but has forgotten to take his doses recently.  He was sent to the emergency department from the cardiac pathology office for rate control with plans to resume anticoagulation and admission for stabilization.  He states that he feels well and very much wants to get his heart rhythm back in order ? ?ED Course: Emergency department EKG revealed atrial flutter with a variable block at 140 currently telemetry shows atrial flutter at 89 bpm while on a diltiazem drip that was begun in the emergency department.  Echocardiogram from April 2022 reveals a normal left ventricular ejection fraction normal right ventricular systolic function and mild pulmonary hypertension at 31.  Patient was referred to internal medicine for further evaluation and management. ? ?Review of Systems: As per HPI otherwise all other systems reviewed and  negative.  ? ?Past Medical History:  ?Diagnosis Date   ? Atrial fibrillation ([place])   ? a. initialy occurring in 2013  b. recurrence in 06/2020 in the setting of COVID-19 --> s/p DCCV in 07/2020  ? Chronic kidney disease, stage 3a ([place])   ? Diabetes mellitus, type 2 ([place])   ? GERD (gastroesophageal reflux disease)   ? Gout   ? Hypertension   ? Mild pulmonary hypertension ([place])   ? Morbid obesity ([place])   ? OSA (obstructive sleep apnea)   ? Paroxysmal atrial flutter ([place])   ? Sleep apnea   ? ? ?Past Surgical History:  ?Procedure Laterality Date  ? BALLOON DILATION N/A 12/25/2020  ? Procedure: BALLOON DILATION;  Surgeon: Eloise Harman, DO;  [facility];  Service: Endoscopy;  Laterality: N/A;  ? BIOPSY  12/25/2020  ? Procedure: BIOPSY;  Surgeon: Eloise Harman, DO;  [facility];  Service: Endoscopy;;  ? CARDIOVERSION N/A 07/09/2020  ? Procedure: CARDIOVERSION;  Surgeon: Arnoldo Lenis, MD;  Location: [facility];  Service: Endoscopy;  Laterality: N/A;  ? COLONOSCOPY N/A 12/08/2016  ? sigmoid and descending colon diverticula, four semi-pedunculated polyps in descending and ascendign colon, few polyps in descending and ascending colon (tubular adenomas). 3 year surveillance.   ? COLONOSCOPY WITH PROPOFOL N/A 12/25/2020  ? Procedure: COLONOSCOPY WITH PROPOFOL;  Surgeon: Eloise Harman, DO;  [facility];  Service: Endoscopy;  Laterality: N/A;  9:30am  ? ESOPHAGOGASTRODUODENOSCOPY N/A 12/08/2016  ? normal esophagus s/p empiric dilation, small hiatal hernia.   ? ESOPHAGOGASTRODUODENOSCOPY (EGD) WITH PROPOFOL N/A 12/25/2020  ? Procedure: ESOPHAGOGASTRODUODENOSCOPY (EGD) WITH PROPOFOL;  Surgeon: Eloise Harman, DO;  [facility];  Service: Endoscopy;  Laterality: N/A;  ? FINGER SURGERY    ? left ring  ? INCISION AND DRAINAGE OF WOUND Left 04/20/2016  ? Procedure: LEFT THIGH DEBRIDEMENT;  Surgeon: Mickeal Skinner, MD;  Location: [facility];  Service: General;  Laterality: Left;  ? MALONEY DILATION N/A 12/08/2016  ? Procedure:  MALONEY DILATION;  Surgeon: Daneil Dolin, MD;  [facility];  Service: Endoscopy;  Laterality: N/A;  ? POLYPECTOMY  12/08/2016  ? Procedure: POLYPECTOMY;  Surgeon: Daneil Dolin, MD;  [facility];  Service: Endoscopy;;  colon  ? POLYPECTOMY  12/25/2020  ? Procedure: POLYPECTOMY;  Surgeon: Eloise Harman, DO;  [facility];  Service: Endoscopy;;  ? ? ?Social History  ? ?Social History Narrative  ? Not on file  ? ? ? reports that he has been smoking cigars. He has never used smokeless tobacco. He reports that he does not currently use alcohol. He reports that he does not use drugs. ? ?No Known Allergies ? ?Family History  ?Problem Relation Age of Onset  ? Heart failure Mother   ? Hypertension Mother   ? Hypertension Father   ? Stroke Father   ? Prostate cancer Father   ? Stroke Brother   ? Prostate cancer Brother   ? Colon cancer Neg Hx   ? Colon polyps Neg Hx   ? ? ? ?Prior to Admission medications   ?Medication Sig Start Date End Date Taking? Authorizing Provider  ?apixaban (ELIQUIS) 5 MG TABS tablet Take 1 tablet (5 mg total) by mouth 2 (two) times daily. 06/14/20  Yes Tat, Shanon Brow, MD  ?atorvastatin (LIPITOR) 20 MG tablet Take 20 mg by mouth daily.   Yes [provider]  ?diltiazem (CARDIZEM CD) 360 MG 24 hr capsule Take 1 capsule (360 mg total) by mouth daily. 06/15/20  Yes Tat, Shanon Brow, MD  ?furosemide (LASIX) 20 MG tablet Take 1 tablet (20 mg total) by mouth daily. 07/03/20 06/16/21 Yes Strader, Fransisco Hertz, PA-C  ?hydrALAZINE (APRESOLINE) 100 MG tablet Take 100 mg by mouth 2 (two) times daily.   Yes [provider]  ?losartan (COZAAR) 100 MG tablet Take 1 tablet (100 mg total) by mouth daily. 10/27/20 06/16/21 Yes Strader, Fransisco Hertz, PA-C  ?metFORMIN (GLUCOPHAGE) 500 MG tablet Take 500 mg by mouth 2 (two) times daily with a meal.   Yes [provider]  ?naproxen (NAPROSYN) 500 MG tablet Take 500 mg by mouth 2 (two) times daily as needed.   Yes [provider]  ?oxymetazoline (AFRIN) 0.05 % nasal spray Place 1 spray into both nostrils 2 (two) times daily as needed for congestion.   Yes [provider]  ?pantoprazole (PROTONIX) 40 MG tablet Take 1 tablet (40 mg total) by mouth 2 (two) times daily before a meal. 30 minutes before breakfast 12/25/20 06/23/21 Yes Carver, Charles K, DO  ?TRESIBA FLEXTOUCH 100 UNIT/ML FlexTouch Pen Inject 40 Units into the skin daily. 05/06/20  Yes [provider]  ? ? ?Physical Exam: ? ?Constitutional: NAD, calm, comfortable ?Vitals:  ? 06/16/21 1630 06/16/21 1645 06/16/21 1700 06/16/21 1730  ?BP: 127/75  126/76 132/75  ?Pulse:    64  ?Resp: '19 12 18 14  '$ ?Temp:      ?TempSrc:      ?SpO2:    97%  ?Weight:      ?Height:      ? ?Eyes: PERRL, lids  and conjunctivae normal ?ENMT: Mucous membranes are moist. Posterior pharynx clear of any exudate or lesions.Normal dentition.  ?Neck: normal, supple, no masses, no thyromegaly ?Respiratory: clear to auscultation bilaterally, no wheezing, no crackles. Normal respiratory effort. No accessory muscle use.  ?Cardiovascular: Regularly irregular rate and rhythm, no murmurs / rubs / gallops. 2-3 +extremity edema (Chronic).  2+ pedal pulses. No carotid bruits.  ?Abdomen: no tenderness, no masses palpated. No hepatosplenomegaly. Bowel sounds positive.  ?Musculoskeletal: no clubbing / cyanosis. No joint deformity upper and lower extremities. Good ROM, no contractures. Normal muscle tone.  ?Skin: no rashes, lesions, ulcers. No induration ?Neurologic: CN 2-12 grossly intact. Sensation intact, DTR normal. Strength 5/5 in all 4.  ?Psychiatric: Normal judgment and insight. Alert and oriented x 3. Normal mood.  ? ? ?Labs on Admission: I have personally reviewed following labs and imaging studies ? ?CBC: ?Recent Labs  ?Lab 06/16/21 ?1531  ?WBC 13.4*  ?HGB 14.0  ?HCT 43.5  ?MCV 91.2  ?PLT 344  ? ?Basic Metabolic Panel: ?Recent Labs  ?Lab 06/16/21 ?1531  ?NA 138  ?K 4.3  ?CL 108  ?CO2 22   ?GLUCOSE 94  ?BUN 29*  ?CREATININE 1.38*  ?CALCIUM 9.5  ?MG 2.0  ? ?Urine analysis: ?   ?Component Value Date/Time  ? [place] 07/16/2011 1407  ? APPEARANCEUR CLEAR 07/16/2011 1407  ? LABSPEC 1.010 0

## 2021-06-16 NOTE — Patient Instructions (Signed)
Follow Up- Will be based on ED evaluation ?

## 2021-06-17 DIAGNOSIS — I4811 Longstanding persistent atrial fibrillation: Secondary | ICD-10-CM | POA: Diagnosis not present

## 2021-06-17 DIAGNOSIS — Z7901 Long term (current) use of anticoagulants: Secondary | ICD-10-CM

## 2021-06-17 DIAGNOSIS — I10 Essential (primary) hypertension: Secondary | ICD-10-CM | POA: Diagnosis not present

## 2021-06-17 DIAGNOSIS — I4892 Unspecified atrial flutter: Secondary | ICD-10-CM | POA: Diagnosis not present

## 2021-06-17 DIAGNOSIS — N1831 Chronic kidney disease, stage 3a: Secondary | ICD-10-CM | POA: Diagnosis not present

## 2021-06-17 DIAGNOSIS — E1165 Type 2 diabetes mellitus with hyperglycemia: Secondary | ICD-10-CM

## 2021-06-17 LAB — BASIC METABOLIC PANEL
Anion gap: 8 (ref 5–15)
BUN: 25 mg/dL — ABNORMAL HIGH (ref 8–23)
CO2: 25 mmol/L (ref 22–32)
Calcium: 9.4 mg/dL (ref 8.9–10.3)
Chloride: 106 mmol/L (ref 98–111)
Creatinine, Ser: 1.3 mg/dL — ABNORMAL HIGH (ref 0.61–1.24)
GFR, Estimated: 60 mL/min — ABNORMAL LOW (ref >60)
Glucose, Bld: 118 mg/dL — ABNORMAL HIGH (ref 70–99)
Potassium: 4.1 mmol/L (ref 3.5–5.1)
Sodium: 139 mmol/L (ref 135–145)

## 2021-06-17 LAB — LIPID PANEL
Cholesterol: 85 mg/dL (ref 0–200)
HDL: 26 mg/dL — ABNORMAL LOW (ref >40)
LDL Cholesterol: 43 mg/dL (ref 0–99)
Total CHOL/HDL Ratio: 3.3 RATIO
Triglycerides: 79 mg/dL (ref <150)
VLDL: 16 mg/dL (ref 0–40)

## 2021-06-17 LAB — CBC
HCT: 43.5 % (ref 39.0–52.0)
Hemoglobin: 13.6 g/dL (ref 13.0–17.0)
MCH: 28.7 pg (ref 26.0–34.0)
MCHC: 31.3 g/dL (ref 30.0–36.0)
MCV: 91.8 fL (ref 80.0–100.0)
Platelets: 315 10*3/uL (ref 150–400)
RBC: 4.74 MIL/uL (ref 4.22–5.81)
RDW: 15.4 % (ref 11.5–15.5)
WBC: 9.7 10*3/uL (ref 4.0–10.5)
nRBC: 0 % (ref 0.0–0.2)

## 2021-06-17 LAB — MRSA NEXT GEN BY PCR, NASAL: MRSA by PCR Next Gen: NOT DETECTED

## 2021-06-17 LAB — CBG MONITORING, ED
Glucose-Capillary: 121 mg/dL — ABNORMAL HIGH (ref 70–99)
Glucose-Capillary: 134 mg/dL — ABNORMAL HIGH (ref 70–99)

## 2021-06-17 LAB — HIV ANTIBODY (ROUTINE TESTING W REFLEX): HIV Screen 4th Generation wRfx: NONREACTIVE

## 2021-06-17 LAB — HEMOGLOBIN A1C
Hgb A1c MFr Bld: 7.2 % — ABNORMAL HIGH (ref 4.8–5.6)
Mean Plasma Glucose: 159.94 mg/dL

## 2021-06-17 LAB — GLUCOSE, CAPILLARY
Glucose-Capillary: 183 mg/dL — ABNORMAL HIGH (ref 70–99)
Glucose-Capillary: 161 mg/dL — ABNORMAL HIGH (ref 70–99)
Glucose-Capillary: 99 mg/dL (ref 70–99)

## 2021-06-17 MED ORDER — METOPROLOL SUCCINATE ER 50 MG PO TB24: 100.0000 mg | ORAL_TABLET | Freq: Every day | ORAL | Status: DC

## 2021-06-17 MED ORDER — DILTIAZEM HCL ER COATED BEADS 120 MG PO CP24
360.0000 mg | ORAL_CAPSULE | Freq: Every day | ORAL | Status: DC
  Administered 2021-06-18 – 2021-06-19 (×2): 360 mg via ORAL
  Filled 2021-06-17 (×2): qty 3

## 2021-06-17 MED ORDER — DIPHENHYDRAMINE HCL 50 MG/ML IJ SOLN
25.0000 mg | Freq: Once | INTRAMUSCULAR | Status: AC
  Administered 2021-06-17: 25 mg via INTRAVENOUS
  Filled 2021-06-17: qty 1

## 2021-06-17 MED ORDER — CHLORHEXIDINE GLUCONATE CLOTH 2 % EX PADS
6.0000 | MEDICATED_PAD | Freq: Every day | CUTANEOUS | Status: DC
  Administered 2021-06-17 – 2021-06-18 (×2): 6 via TOPICAL

## 2021-06-17 MED ORDER — HYDROCORTISONE 1 % EX CREA
1.0000 "application " | TOPICAL_CREAM | CUTANEOUS | Status: DC | PRN
  Administered 2021-06-17: 1 via TOPICAL
  Filled 2021-06-17: qty 28

## 2021-06-17 NOTE — Progress Notes (Signed)
?PROGRESS NOTE ? ? ?Kevin Goodman  XAJ:[id number] DOB: 1950/12/09 DOA: 06/16/2021 ?PCP: Carrolyn Meiers, MD  ? ?Chief Complaint  ?Patient presents with  ? Tachycardia  ? ?Level of care: Telemetry ? ?Brief Admission History:  ? 71 y.o. male with medical history significant of chronic atrial fibrillation, hypertension, obesity, diabetes mellitus type 2 without hyperglycemia, lymphedema, occasional episodes of atrial flutter who went to the cardiology clinic for his routine appointment today.  When vital signs were obtained patient was noted to have a pulse of 140.  An EKG was obtained and revealed atrial flutter with a heart rate in the 140s.  With a variable block.  He was not aware that his heart was racing.  He had no symptoms of palpitations, no shortness of breath, no troubles with chest pain and denies dizziness.  He states that his chronic lymphedema is unchanged from recent past.  He is supposed to be taking Eliquis but has forgotten to take his doses recently.  He was sent to the emergency department from the cardiac pathology office for rate control with plans to resume anticoagulation and admission for stabilization.  He states that he feels well and very much wants to get his heart rhythm back in order ?  ?ED Course: Emergency department EKG revealed atrial flutter with a variable block at 140 currently telemetry shows atrial flutter at 89 bpm while on a diltiazem drip that was begun in the emergency department.  Echocardiogram from April 2022 reveals a normal left ventricular ejection fraction normal right ventricular systolic function and mild pulmonary hypertension at 31.  Patient was referred to internal medicine for further evaluation and management. ?  ?Assessment and Plan: ?1.  Atrial flutter with rapid ventricular response: Patient off diltiazem drip and on oral formulation of cardizem CD 360 mg.    We will monitor his heart rate as he is relatively stable right now.  Given that he is well  rate controlled  If he converts to a sinus rhythm will obtain an EKG as he may need to be anticoagulated for several weeks prior to trying a cardioversion. ?Follow up TTE if there is findings of new systolic heart failure cardio planning TEE/DCCV, otherwise continue rate control management plan.   ? ?2.  Type II diabetes mellitus: Hold home metformin.  Continue basal insulin.  Use sliding scale insulin coverage.  See orders. ?CBG (last 3)  ?Recent Labs  ?  06/17/21 ?0007 06/17/21 ?0914 06/17/21 ?0947  ?GLUCAP 121* 134* 183*  ? ? ?3.  Long-term current use of anticoagulants: Has been using his NOACs not on a reliable schedule.  He currently has not had medication in several days.  Continue Eliquis 5 mg p.o. twice daily.  [place] cardiology consultation.   ? ?4.  Hypertension: Blood pressures acceptable in the emergency department.  Continue home medications. ? ?5.  Morbid obesity: Patient counseled on healthy eating habits, exercise and diet. ?  ?DVT prophylaxis: Oral Eliquis ?Code Status: Full ?Family Communication: Patient retains capacity no family present at the time of admission. ?Disposition Plan: Likely home in 1-2 days ?Consults called: Cardiology ?Admission status: Inpatient ?  ? ?DVT prophylaxis: apixaban  ?Code Status: Full  ?Family Communication:  ?Disposition: Status is: Inpatient ?Remains inpatient appropriate because: IV cardizem infusion, transitioning to oral formulation ?  ?Consultants:  ?Cardiology  ?Procedures:  ? ?Antimicrobials:  ?  ?Subjective: ?No palpitations or CP symptoms.   ?Objective: ?Vitals:  ? 06/17/21 1300 06/17/21 1400 06/17/21 1500 06/17/21 1623  ?  BP: (!) 178/49 (!) 155/97 (!) 171/88   ?Pulse: 76 79 64 77  ?Resp: 20 12 (!) 22 (!) 24  ?Temp:    97.8 ?F (36.6 ?C)  ?TempSrc:    Oral  ?SpO2: 100% 99% 100% 100%  ?Weight:      ?Height:      ? ? ?Intake/Output Summary (Last 24 hours) at 06/17/2021 1634 ?Last data filed at 06/17/2021 1547 ?Gross per 24 hour  ?Intake 1439.18 ml  ?Output --   ?Net 1439.18 ml  ? ?Filed Weights  ? 06/16/21 1514  ?Weight: (!) 172.8 kg  ? ?Examination: ? ?General exam: Appears calm and comfortable  ?Respiratory system: Clear to auscultation. Respiratory effort normal. ?Cardiovascular system: normal S1 & S2 heard. No JVD, murmurs, rubs, gallops or clicks. No pedal edema. ?Gastrointestinal system: Abdomen is nondistended, soft and nontender. No organomegaly or masses felt. Normal bowel sounds heard. ?Central nervous system: Alert and oriented. No focal neurological deficits. ?Extremities: Symmetric 5 x 5 power. ?Skin: No rashes, lesions or ulcers. ?Psychiatry: Judgement and insight appear normal. Mood & affect appropriate.  ? ?Data Reviewed: I have personally reviewed following labs and imaging studies ? ?CBC: ?Recent Labs  ?Lab 06/16/21 ?1531 06/17/21 ?0339  ?WBC 13.4* 9.7  ?HGB 14.0 13.6  ?HCT 43.5 43.5  ?MCV 91.2 91.8  ?PLT 344 315  ? ? ?Basic Metabolic Panel: ?Recent Labs  ?Lab 06/16/21 ?1531 06/17/21 ?0339  ?NA 138 139  ?K 4.3 4.1  ?CL 108 106  ?CO2 22 25  ?GLUCOSE 94 118*  ?BUN 29* 25*  ?CREATININE 1.38* 1.30*  ?CALCIUM 9.5 9.4  ?MG 2.0  --   ? ? ?CBG: ?Recent Labs  ?Lab 06/17/21 ?0007 06/17/21 ?0932 06/17/21 ?6712  ?GLUCAP 121* 134* 183*  ? ? ?Recent Results (from the past 240 hour(s))  ?MRSA Next Gen by PCR, Nasal     Status: None  ? Collection Time: 06/17/21 10:28 AM  ? Specimen: Nasal Mucosa; Nasal Swab  ?Result Value Ref Range Status  ? MRSA by PCR Next Gen NOT DETECTED NOT DETECTED Final  ?  Comment: (NOTE) ?The GeneXpert MRSA Assay (FDA approved for NASAL specimens only), ?is one component of a comprehensive MRSA colonization surveillance ?program. It is not intended to diagnose MRSA infection nor to guide ?or monitor treatment for MRSA infections. ?Test performance is not FDA approved in patients less than 2 years ?old. ?Performed at [facility], [address], [place], [place] [address] ?  ?  ? ?Radiology Studies: ?DG Chest Port 1 View ? ?Result Date:  06/16/2021 ?CLINICAL DATA:  Atrial flutter. EXAM: PORTABLE CHEST 1 VIEW COMPARISON:  AP chest 06/10/2020 FINDINGS: Cardiac silhouette and mediastinal contours are within normal limits. The lungs are clear. No pleural effusion or pneumothorax. No acute skeletal abnormality. IMPRESSION: No active disease. Electronically Signed   By: Yvonne Kendall M.D.   On: 06/16/2021 15:50   ? ?Scheduled Meds: ? apixaban  5 mg Oral BID  ? atorvastatin  20 mg Oral Daily  ? Chlorhexidine Gluconate Cloth  6 each Topical Daily  ? [START ON 06/18/2021] diltiazem  360 mg Oral Daily  ? furosemide  20 mg Oral Daily  ? hydrALAZINE  100 mg Oral BID  ? insulin aspart  0-9 Units Subcutaneous TID WC  ? insulin aspart  3 Units Subcutaneous TID WC  ? insulin glargine-yfgn  40 Units Subcutaneous QHS  ? losartan  100 mg Oral Daily  ? pantoprazole  40 mg Oral BID AC  ? ?Continuous  Infusions: ? 0.9 % NaCl with KCl 20 mEq / L 75 mL/hr at 06/17/21 1547  ? diltiazem (CARDIZEM) infusion Stopped (06/17/21 1546)  ? ? ? LOS: 1 day  ? ?Time spent: 36 mins ? ?Irwin Brakeman, MD ?How to contact the [facility] Attending or Consulting provider [place] or covering provider during after hours [place], for this patient?  ?Check the care team in [facility] and look for a) attending/consulting [facility] provider listed and b) the [facility] team listed ?Log into [email or web address] and use [place]'s universal password to access. If you do not have the password, please contact the hospital operator. ?Locate the [facility] provider you are looking for under [facility] and page to a number that you can be directly reached. ?If you still have difficulty reaching the provider, please page the [facility] (Director on Call) for the Hospitalists listed on amion for assistance. ? ?06/17/2021, 4:34 PM  ? ? ?

## 2021-06-17 NOTE — Hospital Course (Signed)
71 y.o. male with medical history significant of chronic atrial fibrillation, hypertension, obesity, diabetes mellitus type 2 without hyperglycemia, lymphedema, occasional episodes of atrial flutter who went to the cardiology clinic for his routine appointment today.  When vital signs were obtained patient was noted to have a pulse of 140.  An EKG was obtained and revealed atrial flutter with a heart rate in the 140s.  With a variable block.  He was not aware that his heart was racing.  He had no symptoms of palpitations, no shortness of breath, no troubles with chest pain and denies dizziness.  He states that his chronic lymphedema is unchanged from recent past.  He is supposed to be taking Eliquis but has forgotten to take his doses recently.  He was sent to the emergency department from the cardiac pathology office for rate control with plans to resume anticoagulation and admission for stabilization.  He states that he feels well and very much wants to get his heart rhythm back in order ?  ?ED Course: Emergency department EKG revealed atrial flutter with a variable block at 140 currently telemetry shows atrial flutter at 89 bpm while on a diltiazem drip that was begun in the emergency department.  Echocardiogram from April 2022 reveals a normal left ventricular ejection fraction normal right ventricular systolic function and mild pulmonary hypertension at 31.  Patient was referred to internal medicine for further evaluation and management. ?

## 2021-06-17 NOTE — Progress Notes (Addendum)
? ?Progress Note ? ?Patient Name: Kevin Goodman ?Date of Encounter: 06/18/2021 ? ?[facility] Cardiologist: Carlyle Dolly, MD  ? ?Subjective  ? ?Feeling better. Does not have palpitations. States LE edema is improved.  ? ?Remains in Afib; HR overall better but still with runs up to 140. ? ? ?Inpatient Medications  ?  ?Scheduled Meds: ? apixaban  5 mg Oral BID  ? atorvastatin  20 mg Oral Daily  ? Chlorhexidine Gluconate Cloth  6 each Topical Daily  ? diltiazem  360 mg Oral Daily  ? furosemide  20 mg Oral Daily  ? hydrALAZINE  100 mg Oral BID  ? insulin aspart  0-9 Units Subcutaneous TID WC  ? insulin aspart  3 Units Subcutaneous TID WC  ? insulin glargine-yfgn  40 Units Subcutaneous QHS  ? losartan  100 mg Oral Daily  ? metoprolol tartrate  50 mg Oral BID  ? pantoprazole  40 mg Oral BID AC  ? ?Continuous Infusions: ? 0.9 % NaCl with KCl 20 mEq / L 35 mL/hr at 06/18/21 1242  ? ?PRN Meds: ?acetaminophen, hydrocortisone cream, ondansetron (ZOFRAN) IV, zolpidem  ? ?Vital Signs  ?  ?Vitals:  ? 06/18/21 0005 06/18/21 0435 06/18/21 0616 06/18/21 1240  ?BP: (!) 166/108 (!) 147/91  136/87  ?Pulse: (!) 48 (!) 106  100  ?Resp: 20 19    ?Temp: 98 ?F (36.7 ?C) 97.7 ?F (36.5 ?C)    ?TempSrc:  Oral    ?SpO2: 97% 98%    ?Weight:   (!) 169.8 kg   ?Height:      ? ? ?Intake/Output Summary (Last 24 hours) at 06/18/2021 1252 ?Last data filed at 06/18/2021 0700 ?Gross per 24 hour  ?Intake 1650.44 ml  ?Output 950 ml  ?Net 700.44 ml  ? ? ?  06/18/2021  ?  6:16 AM 06/16/2021  ?  3:14 PM 06/16/2021  ?  2:33 PM  ?Last 3 Weights  ?Weight (lbs) 374 lb 6.4 oz 381 lb 381 lb  ?Weight (kg) 169.827 kg 172.82 kg 172.82 kg  ?   ? ?Telemetry  ?  ?Afib with HR 90-140; mainly 100-110- Personally Reviewed ? ?ECG  ?  ?No new tracing - Personally Reviewed ? ?Physical Exam  ? ?GEN: No acute distress.   ?Neck: No JVD ?Cardiac: Irregular, no murmurs ?Respiratory: Clear to auscultation bilaterally. ?GI: Soft, nontender, non-distended  ?MS: Trace edema,  warm ?Neuro:  Nonfocal  ?Psych: Normal affect  ? ?Labs  ?  ?High Sensitivity Troponin:  No results for input(s): TROPONINIHS in the last 720 hours.   ?Chemistry ?Recent Labs  ?Lab 06/16/21 ?1531 06/17/21 ?4403 06/18/21 ?4742  ?NA 138 139 139  ?K 4.3 4.1 4.0  ?CL 108 106 108  ?CO2 '22 25 24  '$ ?GLUCOSE 94 118* 112*  ?BUN 29* 25* 18  ?CREATININE 1.38* 1.30* 1.30*  ?CALCIUM 9.5 9.4 9.3  ?MG 2.0  --   --   ?GFRNONAA 56* 60* 60*  ?ANIONGAP '8 8 7  '$ ?  ?Lipids  ?Recent Labs  ?Lab 06/17/21 ?0339  ?CHOL 85  ?TRIG 79  ?HDL 26*  ?[place] 43  ?CHOLHDL 3.3  ?  ?Hematology ?Recent Labs  ?Lab 06/16/21 ?1531 06/17/21 ?5956 06/18/21 ?3875  ?WBC 13.4* 9.7 11.1*  ?RBC 4.77 4.74 4.69  ?HGB 14.0 13.6 13.2  ?HCT 43.5 43.5 42.8  ?MCV 91.2 91.8 91.3  ?MCH 29.4 28.7 28.1  ?MCHC 32.2 31.3 30.8  ?RDW 15.4 15.4 15.3  ?PLT 344 315 336  ? ?Thyroid  No results for input(s): TSH, FREET4 in the last 168 hours.  ?BNPNo results for input(s): BNP, PROBNP in the last 168 hours.  ?DDimer No results for input(s): DDIMER in the last 168 hours.  ? ?Radiology  ?  ?DG Chest Port 1 View ? ?Result Date: 06/16/2021 ?CLINICAL DATA:  Atrial flutter. EXAM: PORTABLE CHEST 1 VIEW COMPARISON:  AP chest 06/10/2020 FINDINGS: Cardiac silhouette and mediastinal contours are within normal limits. The lungs are clear. No pleural effusion or pneumothorax. No acute skeletal abnormality. IMPRESSION: No active disease. Electronically Signed   By: Yvonne Kendall M.D.   On: 06/16/2021 15:50   ? ?Cardiac Studies  ? ?TTE 06/2020: ?IMPRESSIONS  ? 1. Left ventricular ejection fraction, by estimation, is 55 to 60%. The  ?left ventricle has normal function. The left ventricle has no regional  ?wall motion abnormalities. There is moderate left ventricular hypertrophy.  ?Left ventricular diastolic  ?parameters are indeterminate.  ? 2. Right ventricular systolic function is normal. The right ventricular  ?size is normal.  ? 3. The mitral valve is normal in structure. No evidence of mitral valve   ?regurgitation. No evidence of mitral stenosis.  ? 4. The aortic valve is tricuspid. There is mild calcification of the  ?aortic valve. There is mild thickening of the aortic valve. Aortic valve  ?regurgitation is not visualized. No aortic stenosis is present.  ? 5. Mild pulmonary HTN, PASP is 31 mmHg.  ? 6. The inferior vena cava is dilated in size with >50% respiratory  ?variability, suggesting right atrial pressure of 8 mmHg.  ? ?Patient Profile  ?   ?71 y.o. male with history of paroxysmal atrial fib/flutter (initially dx in 2013 with recurrence of atrial flutter 06/2020 in setting of Covid s/p DCCV 07/2020, HTN, DM, CKD stage 3a, OSA (not on CPAP), chronic edema, mild pulmonary HTN by echo, morbid obesity who presented to cardiology clinic found to be in [place] with RVR with HR 140. He was referred to the ER for further management.  ? ?Assessment & Plan  ?  ?#Aflutter with RVR: ?CHADs-vasc 3. Patient presented to Cardiology clinic with rapid Aflutter prompting transfer to ER. He was not candidate for DCCV due to missing a dose of apixaban. Was placed on dilt gtt>PO dilt for rate control and continued on apixaban. TTE pending. May consider TEE/DCCV pending ability to rate control and TTE findings. ?-Continue apixaban '5mg'$  BID ?-Continue dilt '360mg'$  daily ?-Start metop '50mg'$  BID ?-May consider TEE/DCCV if remains difficult to rate control ?-Needs outpatient sleep study ? ?#DMII: ?-Continue insulin ? ?#HTN: ?-Continue losartan '100mg'$  daily ?-Continue dilt '360mg'$  daily ?-Star metop '50mg'$  BID as above ? ?#HLD: ?-Continue lipitor '20mg'$  daily ? ? ?For questions or updates, please contact [place] ?Please consult [email or web address] for contact info under  ? ?  ?   ?Signed, ?Freada Bergeron, MD  ?06/18/2021, 12:52 PM    ?

## 2021-06-17 NOTE — Progress Notes (Signed)
? ?Progress Note ? ?Patient Name: Kevin Goodman ?Date of Encounter: 06/17/2021 ? ?Primary Cardiologist: Carlyle Dolly, MD  ? ?Subjective  ? ?Admitted heart care evaluation.   ?Rate control has improved ?Patient notes that he is feeling much better.  No further palpitations ? ?Inpatient Medications  ?  ?Scheduled Meds: ? apixaban  5 mg Oral BID  ? atorvastatin  20 mg Oral Daily  ? diltiazem  360 mg Oral Daily  ? furosemide  20 mg Oral Daily  ? hydrALAZINE  100 mg Oral BID  ? insulin aspart  0-9 Units Subcutaneous TID WC  ? insulin aspart  3 Units Subcutaneous TID WC  ? insulin glargine-yfgn  40 Units Subcutaneous QHS  ? losartan  100 mg Oral Daily  ? pantoprazole  40 mg Oral BID AC  ? ?Continuous Infusions: ? 0.9 % NaCl with KCl 20 mEq / L 75 mL/hr at 06/16/21 2204  ? diltiazem (CARDIZEM) infusion 5 mg/hr (06/16/21 1809)  ? ?PRN Meds: ?acetaminophen, ondansetron (ZOFRAN) IV, zolpidem  ? ?Vital Signs  ?  ?Vitals:  ? 06/17/21 0445 06/17/21 0500 06/17/21 0700 06/17/21 0730  ?BP: 118/67 98/78 129/78 (!) 146/85  ?Pulse: 78 (!) 110 77 76  ?Resp: '19 16 18 '$ (!) 21  ?Temp:      ?TempSrc:      ?SpO2: 100% 96% 100% 99%  ?Weight:      ?Height:      ? ? ?Intake/Output Summary (Last 24 hours) at 06/17/2021 0850 ?Last data filed at 06/16/2021 1809 ?Gross per 24 hour  ?Intake 28.74 ml  ?Output --  ?Net 28.74 ml  ? ?Filed Weights  ? 06/16/21 1514  ?Weight: (!) 172.8 kg  ? ? ?Telemetry  ?  ?AFL rare controlled - Personally Reviewed ? ? ?Physical Exam  ? ?Gen: no distress, Morbid Obesity ?Neck: No JVD ?Cardiac: No Rubs or Gallops, no Murmur, IRIR distant heart sounds ?Respiratory: Clear to auscultation bilaterally, normal effort, normal  respiratory rate ?GI: Soft, nontender, non-distended  ?MS: Non pitting  edema;  moves all extremities ?Integument: Skin feels warm ?Neuro:  At time of evaluation, alert and oriented to person/place/time/situation  ?Psych: Normal affect, patient feels well ? ? ?Labs  ?  ?Chemistry ?Recent Labs  ?Lab  06/16/21 ?1531 06/17/21 ?0339  ?NA 138 139  ?K 4.3 4.1  ?CL 108 106  ?CO2 22 25  ?GLUCOSE 94 118*  ?BUN 29* 25*  ?CREATININE 1.38* 1.30*  ?CALCIUM 9.5 9.4  ?GFRNONAA 56* 60*  ?ANIONGAP 8 8  ?  ? ?Hematology ?Recent Labs  ?Lab 06/16/21 ?1531 06/17/21 ?0339  ?WBC 13.4* 9.7  ?RBC 4.77 4.74  ?HGB 14.0 13.6  ?HCT 43.5 43.5  ?MCV 91.2 91.8  ?MCH 29.4 28.7  ?MCHC 32.2 31.3  ?RDW 15.4 15.4  ?PLT 344 315  ? ? ?Cardiac EnzymesNo results for input(s): TROPONINI in the last 168 hours. No results for input(s): TROPIPOC in the last 168 hours.  ? ?BNPNo results for input(s): BNP, PROBNP in the last 168 hours.  ? ?DDimer No results for input(s): DDIMER in the last 168 hours.  ? ?Radiology  ?  ?DG Chest Port 1 View ? ?Result Date: 06/16/2021 ?CLINICAL DATA:  Atrial flutter. EXAM: PORTABLE CHEST 1 VIEW COMPARISON:  AP chest 06/10/2020 FINDINGS: Cardiac silhouette and mediastinal contours are within normal limits. The lungs are clear. No pleural effusion or pneumothorax. No acute skeletal abnormality. IMPRESSION: No active disease. Electronically Signed   By: Yvonne Kendall M.D.   On: 06/16/2021  15:50   ? ? ?Patient Profile  ?   ?71 y.o. male seen in clinic with AFL RVR ? ?Assessment & Plan  ?  ?PAFL ?- CHADVASC 3 returned to eliquis ?Morbid Obesity, OSA ?HTN & DM ?CKD stage IIIa ?- rate control and euvolemic then consider TEE/DCCV;  ?- echo is pending ?- we will trial to transition to PO diltiazem today ?- he is NPO at midnight only for new HFrEF, if this is the case given that he has missed his medication, TEE/DCCV would be reasonable (we have consented patient for this if this were to be the case); we would also transition to metoprolol if this were the case ?- continue DOAC ? ?For questions or updates, please contact [place] ?Please consult [email or web address] for contact info under Cardiology/STEMI. ?  ?   ?Signed, ?Werner Lean, MD  ?06/17/2021, 8:50 AM   ? ?

## 2021-06-17 NOTE — TOC Progression Note (Signed)
?  Transition of Care (TOC) Screening Note ? ? ?Patient Details  ?Name: Kevin Goodman ?Date of Birth: Feb 01, 1951 ? ? ?Transition of Care (TOC) CM/SW Contact:    ?Shade Flood, LCSW ?Phone Number: ?06/17/2021, 1:02 PM ? ? ? ?Transition of Care Department [facility]) has reviewed patient and no TOC needs have been identified at this time. We will continue to monitor patient advancement through interdisciplinary progression rounds. If new patient transition needs arise, please place a TOC consult. ? ? ?

## 2021-06-18 ENCOUNTER — Inpatient Hospital Stay (HOSPITAL_COMMUNITY): Payer: Medicare PPO

## 2021-06-18 DIAGNOSIS — I4891 Unspecified atrial fibrillation: Secondary | ICD-10-CM | POA: Diagnosis not present

## 2021-06-18 DIAGNOSIS — I4892 Unspecified atrial flutter: Secondary | ICD-10-CM | POA: Diagnosis not present

## 2021-06-18 DIAGNOSIS — I10 Essential (primary) hypertension: Secondary | ICD-10-CM | POA: Diagnosis not present

## 2021-06-18 LAB — CBC
HCT: 42.8 % (ref 39.0–52.0)
Hemoglobin: 13.2 g/dL (ref 13.0–17.0)
MCH: 28.1 pg (ref 26.0–34.0)
MCHC: 30.8 g/dL (ref 30.0–36.0)
MCV: 91.3 fL (ref 80.0–100.0)
Platelets: 336 10*3/uL (ref 150–400)
RBC: 4.69 MIL/uL (ref 4.22–5.81)
RDW: 15.3 % (ref 11.5–15.5)
WBC: 11.1 10*3/uL — ABNORMAL HIGH (ref 4.0–10.5)
nRBC: 0 % (ref 0.0–0.2)

## 2021-06-18 LAB — ECHOCARDIOGRAM COMPLETE
AR max vel: 2.87 cm2
AV Area VTI: 3.1 cm2
AV Area mean vel: 2.8 cm2
AV Mean grad: 3 mmHg
AV Peak grad: 5.1 mmHg
Ao pk vel: 1.13 m/s
Area-P 1/2: 4.96 cm2
Height: 73 in
MV VTI: 2.79 cm2
S' Lateral: 2.9 cm
Weight: 5990.4 oz

## 2021-06-18 LAB — BASIC METABOLIC PANEL
Anion gap: 7 (ref 5–15)
BUN: 18 mg/dL (ref 8–23)
CO2: 24 mmol/L (ref 22–32)
Calcium: 9.3 mg/dL (ref 8.9–10.3)
Chloride: 108 mmol/L (ref 98–111)
Creatinine, Ser: 1.3 mg/dL — ABNORMAL HIGH (ref 0.61–1.24)
GFR, Estimated: 60 mL/min — ABNORMAL LOW (ref >60)
Glucose, Bld: 112 mg/dL — ABNORMAL HIGH (ref 70–99)
Potassium: 4 mmol/L (ref 3.5–5.1)
Sodium: 139 mmol/L (ref 135–145)

## 2021-06-18 LAB — GLUCOSE, CAPILLARY
Glucose-Capillary: 114 mg/dL — ABNORMAL HIGH (ref 70–99)
Glucose-Capillary: 117 mg/dL — ABNORMAL HIGH (ref 70–99)
Glucose-Capillary: 106 mg/dL — ABNORMAL HIGH (ref 70–99)
Glucose-Capillary: 138 mg/dL — ABNORMAL HIGH (ref 70–99)

## 2021-06-18 MED ORDER — DILTIAZEM HCL 25 MG/5ML IV SOLN
15.0000 mg | Freq: Once | INTRAVENOUS | Status: AC
  Administered 2021-06-18: 15 mg via INTRAVENOUS
  Filled 2021-06-18: qty 5

## 2021-06-18 MED ORDER — METOPROLOL TARTRATE 50 MG PO TABS
50.0000 mg | ORAL_TABLET | Freq: Two times a day (BID) | ORAL | Status: DC
  Administered 2021-06-18 – 2021-06-19 (×3): 50 mg via ORAL
  Filled 2021-06-18 (×3): qty 1

## 2021-06-18 MED ORDER — METOPROLOL TARTRATE 5 MG/5ML IV SOLN
5.0000 mg | Freq: Once | INTRAVENOUS | Status: AC
  Administered 2021-06-18: 5 mg via INTRAVENOUS
  Filled 2021-06-18: qty 5

## 2021-06-18 NOTE — Progress Notes (Addendum)
Patient's heart rate has ranged from 110s-150s for the last few hours. EKG showed a flutter with variable AV block; lateral infarct; age undetermined; abnormal EKG; rate 133 ?MD notified. New orders received for Cardizem. ?Patient denies chest pain but is restless   ?

## 2021-06-18 NOTE — Progress Notes (Signed)
*  PRELIMINARY RESULTS* ?Echocardiogram ?2D Echocardiogram has been performed. ? ?Kevin Goodman ?06/18/2021, 10:01 AM ?

## 2021-06-18 NOTE — Progress Notes (Signed)
?PROGRESS NOTE ? ? ?Kevin Goodman  NIO:[id number] DOB: [birth year] DOA: 06/16/2021 ?PCP: Carrolyn Meiers, MD  ? ?Chief Complaint  ?Patient presents with  ? Tachycardia  ? ?Level of care: Telemetry ? ?Brief Admission History:  ? 71 y.o. male with medical history significant of chronic atrial fibrillation, hypertension, obesity, diabetes mellitus type 2 without hyperglycemia, lymphedema, occasional episodes of atrial flutter who went to the cardiology clinic for his routine appointment today.  When vital signs were obtained patient was noted to have a pulse of 140.  An EKG was obtained and revealed atrial flutter with a heart rate in the 140s.  With a variable block.  He was not aware that his heart was racing.  He had no symptoms of palpitations, no shortness of breath, no troubles with chest pain and denies dizziness.  He states that his chronic lymphedema is unchanged from recent past.  He is supposed to be taking Eliquis but has forgotten to take his doses recently.  He was sent to the emergency department from the cardiac pathology office for rate control with plans to resume anticoagulation and admission for stabilization.  He states that he feels well and very much wants to get his heart rhythm back in order ?  ?ED Course: Emergency department EKG revealed atrial flutter with a variable block at 140 currently telemetry shows atrial flutter at 89 bpm while on a diltiazem drip that was begun in the emergency department.  Echocardiogram from April 2022 reveals a normal left ventricular ejection fraction normal right ventricular systolic function and mild pulmonary hypertension at 31.  Patient was referred to internal medicine for further evaluation and management. ?  ?Assessment and Plan: ?1.  Atrial flutter with rapid ventricular response: Patient off diltiazem drip and on oral formulation of cardizem CD 360 mg.    We will monitor his heart rate as he is relatively stable right now.  Given that he is well  rate controlled  If he converts to a sinus rhythm will obtain an EKG as he may need to be anticoagulated for several weeks prior to trying a cardioversion. ?Follow up TTE if there is findings of new systolic heart failure cardio planning TEE/DCCV, otherwise continue rate control management plan.   Cardiology added metoprolol 50 mg BID on 06/18/2021.  TTE still pending at this time.  ? ?2.  Type II diabetes mellitus: Hold home metformin.  Continue basal insulin.  Use sliding scale insulin coverage.  See orders. ?CBG (last 3)  ?Recent Labs  ?  06/17/21 ?2059 06/18/21 ?0719 06/18/21 ?1116  ?GLUCAP 99 114* 117*  ? ?3.  Long-term current use of anticoagulants: Has been using his NOACs not on a reliable schedule.  He currently has not had medication in several days.  Continue Eliquis 5 mg p.o. twice daily.  [place] cardiology consultation.   ? ?4.  Hypertension: Blood pressures acceptable in the emergency department.  Continue home medications. ? ?5.  Morbid obesity: Patient counseled on healthy eating habits, exercise and diet. ?  ?DVT prophylaxis: Oral Eliquis ?Code Status: Full ?Family Communication: none present during rounds  ?Disposition Plan: Likely home in 1-2 days ?Consults called: Cardiology ?Admission status: Inpatient ?  ? ?DVT prophylaxis: apixaban  ?Code Status: Full  ?Family Communication:  ?Disposition: Status is: Inpatient ?Remains inpatient appropriate because: IV cardizem infusion, transitioning to oral formulation ?  ?Consultants:  ?Cardiology  ?Procedures:  ? ?Antimicrobials:  ?  ?Subjective: ?Pt reports that he had some palpitations when getting up to  go to bathroom this morning.  No chest pain today. No SOB.    ?Objective: ?Vitals:  ? 06/18/21 0435 06/18/21 0616 06/18/21 1240 06/18/21 1309  ?BP: (!) 147/91  136/87 (!) 117/95  ?Pulse: (!) 106  100 60  ?Resp: 19   20  ?Temp: 97.7 ?F (36.5 ?C)   98.1 ?F (36.7 ?C)  ?TempSrc: Oral   Oral  ?SpO2: 98%   96%  ?Weight:  (!) 169.8 kg    ?Height:       ? ? ?Intake/Output Summary (Last 24 hours) at 06/18/2021 1434 ?Last data filed at 06/18/2021 1300 ?Gross per 24 hour  ?Intake 1890.44 ml  ?Output 950 ml  ?Net 940.44 ml  ? ?Filed Weights  ? 06/16/21 1514 06/18/21 0616  ?Weight: (!) 172.8 kg (!) 169.8 kg  ? ?Examination: ? ?General exam: Appears calm and comfortable  ?Respiratory system: Clear to auscultation. Respiratory effort normal. ?Cardiovascular system: normal S1 & S2 heard. No JVD, murmurs, rubs, gallops or clicks. No pedal edema. ?Gastrointestinal system: Abdomen is nondistended, soft and nontender. No organomegaly or masses felt. Normal bowel sounds heard. ?Central nervous system: Alert and oriented. No focal neurological deficits. ?Extremities: Symmetric 5 x 5 power. ?Skin: No rashes, lesions or ulcers. ?Psychiatry: Judgement and insight appear normal. Mood & affect appropriate.  ? ?Data Reviewed: I have personally reviewed following labs and imaging studies ? ?CBC: ?Recent Labs  ?Lab 06/16/21 ?1531 06/17/21 ?5320 06/18/21 ?2334  ?WBC 13.4* 9.7 11.1*  ?HGB 14.0 13.6 13.2  ?HCT 43.5 43.5 42.8  ?MCV 91.2 91.8 91.3  ?PLT 344 315 336  ? ? ?Basic Metabolic Panel: ?Recent Labs  ?Lab 06/16/21 ?1531 06/17/21 ?3568 06/18/21 ?6168  ?NA 138 139 139  ?K 4.3 4.1 4.0  ?CL 108 106 108  ?CO2 '22 25 24  '$ ?GLUCOSE 94 118* 112*  ?BUN 29* 25* 18  ?CREATININE 1.38* 1.30* 1.30*  ?CALCIUM 9.5 9.4 9.3  ?MG 2.0  --   --   ? ? ?CBG: ?Recent Labs  ?Lab 06/17/21 ?1134 06/17/21 ?1746 06/17/21 ?2059 06/18/21 ?0719 06/18/21 ?1116  ?GLUCAP 183* 161* 99 114* 117*  ? ? ?Recent Results (from the past 240 hour(s))  ?MRSA Next Gen by PCR, Nasal     Status: None  ? Collection Time: 06/17/21 10:28 AM  ? Specimen: Nasal Mucosa; Nasal Swab  ?Result Value Ref Range Status  ? MRSA by PCR Next Gen NOT DETECTED NOT DETECTED Final  ?  Comment: (NOTE) ?The GeneXpert MRSA Assay (FDA approved for NASAL specimens only), ?is one component of a comprehensive MRSA colonization surveillance ?program. It is not  intended to diagnose MRSA infection nor to guide ?or monitor treatment for MRSA infections. ?Test performance is not FDA approved in patients less than 2 years ?old. ?Performed at [facility], [address] [place], [place] [address] ?  ?  ? ?Radiology Studies: ?DG Chest Port 1 View ? ?Result Date: 06/16/2021 ?CLINICAL DATA:  Atrial flutter. EXAM: PORTABLE CHEST 1 VIEW COMPARISON:  AP chest 06/10/2020 FINDINGS: Cardiac silhouette and mediastinal contours are within normal limits. The lungs are clear. No pleural effusion or pneumothorax. No acute skeletal abnormality. IMPRESSION: No active disease. Electronically Signed   By: Yvonne Kendall M.D.   On: 06/16/2021 15:50   ? ?Scheduled Meds: ? apixaban  5 mg Oral BID  ? atorvastatin  20 mg Oral Daily  ? Chlorhexidine Gluconate Cloth  6 each Topical Daily  ? diltiazem  360 mg Oral Daily  ? furosemide  20  mg Oral Daily  ? hydrALAZINE  100 mg Oral BID  ? insulin aspart  0-9 Units Subcutaneous TID WC  ? insulin aspart  3 Units Subcutaneous TID WC  ? insulin glargine-yfgn  40 Units Subcutaneous QHS  ? losartan  100 mg Oral Daily  ? metoprolol tartrate  50 mg Oral BID  ? pantoprazole  40 mg Oral BID AC  ? ?Continuous Infusions: ? 0.9 % NaCl with KCl 20 mEq / L 35 mL/hr at 06/18/21 1242  ? ? LOS: 2 days  ? ?Time spent: 35 mins ? ?Irwin Brakeman, MD ?How to contact the [facility] Attending or Consulting provider [place] or covering provider during after hours [place], for this patient?  ?Check the care team in [facility] and look for a) attending/consulting [facility] provider listed and b) the [facility] team listed ?Log into [email or web address] and use [place]'s universal password to access. If you do not have the password, please contact the hospital operator. ?Locate the [facility] provider you are looking for under [facility] and page to a number that you can be directly reached. ?If you still have difficulty reaching the provider, please page the [facility] (Director on Call) for the Hospitalists listed on  amion for assistance. ? ?06/18/2021, 2:34 PM  ? ? ?

## 2021-06-19 DIAGNOSIS — I10 Essential (primary) hypertension: Secondary | ICD-10-CM | POA: Diagnosis not present

## 2021-06-19 DIAGNOSIS — Z7901 Long term (current) use of anticoagulants: Secondary | ICD-10-CM | POA: Diagnosis not present

## 2021-06-19 DIAGNOSIS — I4892 Unspecified atrial flutter: Secondary | ICD-10-CM | POA: Diagnosis not present

## 2021-06-19 LAB — GLUCOSE, CAPILLARY
Glucose-Capillary: 86 mg/dL (ref 70–99)
Glucose-Capillary: 89 mg/dL (ref 70–99)

## 2021-06-19 MED ORDER — DILTIAZEM HCL ER COATED BEADS 360 MG PO CP24
360.0000 mg | ORAL_CAPSULE | Freq: Every day | ORAL | 1 refills | Status: DC
Start: 2021-06-19 — End: 2021-12-12

## 2021-06-19 MED ORDER — APIXABAN 5 MG PO TABS: 5.0000 mg | ORAL_TABLET | Freq: Two times a day (BID) | ORAL | 1 refills | Status: AC

## 2021-06-19 MED ORDER — METOPROLOL TARTRATE 50 MG PO TABS
50.0000 mg | ORAL_TABLET | Freq: Two times a day (BID) | ORAL | 1 refills | Status: DC
Start: 2021-06-19 — End: 2023-02-11

## 2021-06-19 NOTE — Progress Notes (Signed)
Ambulating in room today, alert and oriented  IV removed and discharge instrutions reviewed.  Follow up appt made with cardiology and to call for follow up with primary.  Scripts sent to pharmacy.  Son picked up car from cardio parking lot and drove around to main entrance.  Transported by WC to car ?

## 2021-06-19 NOTE — Care Management Important Message (Signed)
Important Message ? ?Patient Details  ?Name: Kevin Goodman ?MRN: [id number] ?Date of Birth: 09-18-1950 ? ? ?Medicare Important Message Given:  Other (see comment) ? ?Attempted to reach patient via room phone to review Medicare IM.  Unable to reach upon attempt.   ? ? ?Dannette Barbara ?06/19/2021, 10:52 AM ?

## 2021-06-19 NOTE — Discharge Summary (Signed)
Physician Discharge Summary  ?Kevin Goodman EML:[id number] DOB: 1951/01/03 DOA: 06/16/2021 ? ?PCP: Carrolyn Meiers, MD ?Cardiology: [facility] ? ?Admit date: 06/16/2021 ?Discharge date: 06/19/2021 ? ?Admitted From:  Home  ?Disposition: Home  ? ?Recommendations for Outpatient Follow-up:  ?Follow up with PCP in 1 weeks ?Follow up with cardiology 07/03/21 as scheduled ?Planning for DCCV in 3 weeks per cardiology team ? ?Discharge Condition: STABLE   ?CODE STATUS: FULL ?DIET:  heart / carb   ? ?Brief Hospitalization Summary: ?Please see all hospital notes, images, labs for full details of the hospitalization. ?Brief Admission History:  ? 71 y.o. male with medical history significant of chronic atrial fibrillation, hypertension, obesity, diabetes mellitus type 2 without hyperglycemia, lymphedema, occasional episodes of atrial flutter who went to the cardiology clinic for his routine appointment today.  When vital signs were obtained patient was noted to have a pulse of 140.  An EKG was obtained and revealed atrial flutter with a heart rate in the 140s.  With a variable block.  He was not aware that his heart was racing.  He had no symptoms of palpitations, no shortness of breath, no troubles with chest pain and denies dizziness.  He states that his chronic lymphedema is unchanged from recent past.  He is supposed to be taking Eliquis but has forgotten to take his doses recently.  He was sent to the emergency department from the cardiac pathology office for rate control with plans to resume anticoagulation and admission for stabilization.  He states that he feels well and very much wants to get his heart rhythm back in order ?  ?ED Course: Emergency department EKG revealed atrial flutter with a variable block at 140 currently telemetry shows atrial flutter at 89 bpm while on a diltiazem drip that was begun in the emergency department.  Echocardiogram from April 2022 reveals a normal left ventricular ejection  fraction normal right ventricular systolic function and mild pulmonary hypertension at 31.  Patient was referred to internal medicine for further evaluation and management. ?  ?Assessment and Plan: ?1.  Atrial flutter with rapid ventricular response: Patient off diltiazem drip and on oral formulation of cardizem CD 360 mg.   We will monitor his heart rate as he is relatively stable right now.  Given that he is well rate controlled  If he converts to a sinus rhythm will obtain an EKG as he may need to be anticoagulated for several weeks prior to trying a cardioversion.  Follow up TTE if there is findings of new systolic heart failure cardio planning TEE/DCCV, otherwise continue rate control management plan.   Cardiology added metoprolol 50 mg BID on 06/18/2021.   TTE: ventricular ejection fraction, by estimation, is 50 to 55%. The left ventricle has low normal function. The left ventricle has no regional wall motion abnormalities. There is moderate concentric left ventricular hypertrophy. Diastolic function indeterminant due to AFib ?DC home on: metoprolol 50 mg BID, cardizem CD 360 mg daily, apixaban 5 mg BID.  ? ?2.  Type II diabetes mellitus: Hold home metformin.  Continue basal insulin.  Use sliding scale insulin coverage.  See orders. ?CBG (last 3)  ?Recent Labs  ?  06/18/21 ?2144 06/19/21 ?0715 06/19/21 ?1114  ?GLUCAP 138* 86 89  ? ?3.  Long-term current use of anticoagulants: Has been using his NOACs not on a reliable schedule.  He currently has not had medication in several days.  Continue Eliquis 5 mg p.o. twice daily.  [place] cardiology consultation.   ? ?  4.  Hypertension: Blood pressures controlled.  Continue home medications.  In addition to the cardizem and metoprolol, he will continue his hydralazine, losartan with close outpatient follow up with cardiology on 4/28 and PCP ? ?5.  Morbid obesity: Patient counseled on healthy eating habits, exercise and diet. ?  ?DVT prophylaxis: Oral Eliquis ?Code  Status: Full ?Family Communication: none present during rounds  ?Disposition Plan: Likely home in 1-2 days ?Consults called: Cardiology ?Admission status: Inpatient ?Discharge Diagnoses:  ?Principal Problem: ?  Atrial flutter with rapid ventricular response ([place]) ?Active Problems: ?  Hypertension ?  Morbid obesity ([place]) ?  Type 2 diabetes mellitus ([place]) ?  Long term (current) use of anticoagulants ? ? ?Discharge Instructions: ?Discharge Instructions   ? ? Amb referral to AFIB Clinic   Complete by: As directed ?  ? ?  ? ?Allergies as of 06/19/2021   ?No Known Allergies ?  ? ?  ?Medication List  ?  ? ?TAKE these medications   ? ?apixaban 5 MG Tabs tablet ?Commonly known as: ELIQUIS ?Take 1 tablet (5 mg total) by mouth 2 (two) times daily. ?  ?atorvastatin 20 MG tablet ?Commonly known as: LIPITOR ?Take 20 mg by mouth daily. ?  ?diltiazem 360 MG 24 hr capsule ?Commonly known as: CARDIZEM CD ?Take 1 capsule (360 mg total) by mouth daily. ?  ?furosemide 20 MG tablet ?Commonly known as: LASIX ?Take 1 tablet (20 mg total) by mouth daily. ?  ?hydrALAZINE 100 MG tablet ?Commonly known as: APRESOLINE ?Take 100 mg by mouth 2 (two) times daily. ?  ?losartan 100 MG tablet ?Commonly known as: COZAAR ?Take 1 tablet (100 mg total) by mouth daily. ?  ?metFORMIN 500 MG tablet ?Commonly known as: GLUCOPHAGE ?Take 500 mg by mouth 2 (two) times daily with a meal. ?  ?metoprolol tartrate 50 MG tablet ?Commonly known as: LOPRESSOR ?Take 1 tablet (50 mg total) by mouth 2 (two) times daily. ?  ?naproxen 500 MG tablet ?Commonly known as: NAPROSYN ?Take 500 mg by mouth 2 (two) times daily as needed. ?  ?oxymetazoline 0.05 % nasal spray ?Commonly known as: AFRIN ?Place 1 spray into both nostrils 2 (two) times daily as needed for congestion. ?  ?pantoprazole 40 MG tablet ?Commonly known as: PROTONIX ?Take 1 tablet (40 mg total) by mouth 2 (two) times daily before a meal. 30 minutes before breakfast ?  ?Tyler Aas FlexTouch 100 UNIT/ML FlexTouch  Pen ?Generic drug: insulin degludec ?Inject 40 Units into the skin daily. ?  ? ?  ? ? Follow-up Information   ? ? Erma Heritage, PA-C Follow up on 07/03/2021.   ?Specialties: Physician Assistant, Cardiology ?Why: Cardiology Hospital Follow-up on 07/03/2021 at 2:30 PM. ?Contact information: ?[address] ?[place] [address] ?[phone] ? ? ?  ?  ? ? Carrolyn Meiers, MD. Schedule an appointment as soon as possible for a visit in 1 week(s).   ?Specialty: Internal Medicine ?Why: Hospital Follow Up ?Contact information: ?[address] ?[place] [address] ?[phone] ? ? ?  ?  ? ?  ?  ? ?  ? ?No Known Allergies ?Allergies as of 06/19/2021   ?No Known Allergies ?  ? ?  ?Medication List  ?  ? ?TAKE these medications   ? ?apixaban 5 MG Tabs tablet ?Commonly known as: ELIQUIS ?Take 1 tablet (5 mg total) by mouth 2 (two) times daily. ?  ?atorvastatin 20 MG tablet ?Commonly known as: LIPITOR ?Take 20 mg by mouth daily. ?  ?  diltiazem 360 MG 24 hr capsule ?Commonly known as: CARDIZEM CD ?Take 1 capsule (360 mg total) by mouth daily. ?  ?furosemide 20 MG tablet ?Commonly known as: LASIX ?Take 1 tablet (20 mg total) by mouth daily. ?  ?hydrALAZINE 100 MG tablet ?Commonly known as: APRESOLINE ?Take 100 mg by mouth 2 (two) times daily. ?  ?losartan 100 MG tablet ?Commonly known as: COZAAR ?Take 1 tablet (100 mg total) by mouth daily. ?  ?metFORMIN 500 MG tablet ?Commonly known as: GLUCOPHAGE ?Take 500 mg by mouth 2 (two) times daily with a meal. ?  ?metoprolol tartrate 50 MG tablet ?Commonly known as: LOPRESSOR ?Take 1 tablet (50 mg total) by mouth 2 (two) times daily. ?  ?naproxen 500 MG tablet ?Commonly known as: NAPROSYN ?Take 500 mg by mouth 2 (two) times daily as needed. ?  ?oxymetazoline 0.05 % nasal spray ?Commonly known as: AFRIN ?Place 1 spray into both nostrils 2 (two) times daily as needed for congestion. ?  ?pantoprazole 40 MG tablet ?Commonly known as: PROTONIX ?Take 1 tablet (40 mg total) by  mouth 2 (two) times daily before a meal. 30 minutes before breakfast ?  ?Tyler Aas FlexTouch 100 UNIT/ML FlexTouch Pen ?Generic drug: insulin degludec ?Inject 40 Units into the skin daily. ?  ? ?  ? ? ?[place]

## 2021-06-19 NOTE — Discharge Instructions (Signed)
IMPORTANT INFORMATION: PAY CLOSE ATTENTION   PHYSICIAN DISCHARGE INSTRUCTIONS  Follow with Primary care provider  Fanta, Tesfaye Demissie, MD  and other consultants as instructed by your Hospitalist Physician  SEEK MEDICAL CARE OR RETURN TO EMERGENCY ROOM IF SYMPTOMS COME BACK, WORSEN OR NEW PROBLEM DEVELOPS   Please note: You were cared for by a hospitalist during your hospital stay. Every effort will be made to forward records to your primary care provider.  You can request that your primary care provider send for your hospital records if they have not received them.  Once you are discharged, your primary care physician will handle any further medical issues. Please note that NO REFILLS for any discharge medications will be authorized once you are discharged, as it is imperative that you return to your primary care physician (or establish a relationship with a primary care physician if you do not have one) for your post hospital discharge needs so that they can reassess your need for medications and monitor your lab values.  Please get a complete blood count and chemistry panel checked by your Primary MD at your next visit, and again as instructed by your Primary MD.  Get Medicines reviewed and adjusted: Please take all your medications with you for your next visit with your Primary MD  Laboratory/radiological data: Please request your Primary MD to go over all hospital tests and procedure/radiological results at the follow up, please ask your primary care provider to get all Hospital records sent to his/her office.  In some cases, they will be blood work, cultures and biopsy results pending at the time of your discharge. Please request that your primary care provider follow up on these results.  If you are diabetic, please bring your blood sugar readings with you to your follow up appointment with primary care.    Please call and make your follow up appointments as soon as possible.     Also Note the following: If you experience worsening of your admission symptoms, develop shortness of breath, life threatening emergency, suicidal or homicidal thoughts you must seek medical attention immediately by calling 911 or calling your MD immediately  if symptoms less severe.  You must read complete instructions/literature along with all the possible adverse reactions/side effects for all the Medicines you take and that have been prescribed to you. Take any new Medicines after you have completely understood and accpet all the possible adverse reactions/side effects.   Do not drive when taking Pain medications or sleeping medications (Benzodiazepines)  Do not take more than prescribed Pain, Sleep and Anxiety Medications. It is not advisable to combine anxiety,sleep and pain medications without talking with your primary care practitioner  Special Instructions: If you have smoked or chewed Tobacco  in the last 2 yrs please stop smoking, stop any regular Alcohol  and or any Recreational drug use.  Wear Seat belts while driving.  Do not drive if taking any narcotic, mind altering or controlled substances or recreational drugs or alcohol.       

## 2021-06-19 NOTE — Progress Notes (Addendum)
? ?Progress Note ? ?Patient Name: Kevin Goodman ?Date of Encounter: 06/19/2021 ? ?[facility] Cardiologist: Carlyle Dolly, MD  ? ?Subjective  ? ?Breathing improved. No specific orthopnea or PND.  No reported palpitations or chest pain. ? ?Inpatient Medications  ?  ?Scheduled Meds: ? apixaban  5 mg Oral BID  ? atorvastatin  20 mg Oral Daily  ? Chlorhexidine Gluconate Cloth  6 each Topical Daily  ? diltiazem  360 mg Oral Daily  ? furosemide  20 mg Oral Daily  ? hydrALAZINE  100 mg Oral BID  ? insulin aspart  0-9 Units Subcutaneous TID WC  ? insulin aspart  3 Units Subcutaneous TID WC  ? insulin glargine-yfgn  40 Units Subcutaneous QHS  ? losartan  100 mg Oral Daily  ? metoprolol tartrate  50 mg Oral BID  ? pantoprazole  40 mg Oral BID AC  ? ?Continuous Infusions: ? 0.9 % NaCl with KCl 20 mEq / L 35 mL/hr at 06/18/21 1900  ? ?PRN Meds: ?acetaminophen, hydrocortisone cream, ondansetron (ZOFRAN) IV, zolpidem  ? ?Vital Signs  ?  ?Vitals:  ? 06/18/21 2047 06/18/21 2143 06/19/21 0421 06/19/21 0500  ?BP: 131/76 131/75 128/89   ?Pulse: (!) 50 (!) 50 60   ?Resp:   18   ?Temp:      ?TempSrc:      ?SpO2: 98%     ?Weight:    (!) 169.9 kg  ?Height:      ? ? ?Intake/Output Summary (Last 24 hours) at 06/19/2021 0909 ?Last data filed at 06/19/2021 8185 ?Gross per 24 hour  ?Intake 960 ml  ?Output 2075 ml  ?Net -1115 ml  ? ? ?  06/19/2021  ?  5:00 AM 06/18/2021  ?  6:16 AM 06/16/2021  ?  3:14 PM  ?Last 3 Weights  ?Weight (lbs) 374 lb 9 oz 374 lb 6.4 oz 381 lb  ?Weight (kg) 169.9 kg 169.827 kg 172.82 kg  ?   ? ?Telemetry  ?  ?Atrial flutter, HR mostly in 70's to 90's. Peaking into 110's.  - Personally Reviewed ? ?ECG  ?  ?Atrial flutter with RVR, HR 133. - Personally Reviewed ? ?Physical Exam  ? ?GEN: Pleasant male appearing in no acute distress.   ?Neck: No JVD ?Cardiac: Irregular irregular. No significant murmurs. ?Respiratory: Clear to auscultation bilaterally. ?GI: Soft, nontender, non-distended  ?MS: Trace lower extremity  edema; No deformity. ?Neuro:  Nonfocal  ?Psych: Normal affect  ? ?Labs  ?  ?High Sensitivity Troponin:  No results for input(s): TROPONINIHS in the last 720 hours.   ?Chemistry ?Recent Labs  ?Lab 06/16/21 ?1531 06/17/21 ?6314 06/18/21 ?9702  ?NA 138 139 139  ?K 4.3 4.1 4.0  ?CL 108 106 108  ?CO2 '22 25 24  '$ ?GLUCOSE 94 118* 112*  ?BUN 29* 25* 18  ?CREATININE 1.38* 1.30* 1.30*  ?CALCIUM 9.5 9.4 9.3  ?MG 2.0  --   --   ?GFRNONAA 56* 60* 60*  ?ANIONGAP '8 8 7  '$ ?  ?Lipids  ?Recent Labs  ?Lab 06/17/21 ?0339  ?CHOL 85  ?TRIG 79  ?HDL 26*  ?[place] 43  ?CHOLHDL 3.3  ?  ?Hematology ?Recent Labs  ?Lab 06/16/21 ?1531 06/17/21 ?6378 06/18/21 ?5885  ?WBC 13.4* 9.7 11.1*  ?RBC 4.77 4.74 4.69  ?HGB 14.0 13.6 13.2  ?HCT 43.5 43.5 42.8  ?MCV 91.2 91.8 91.3  ?MCH 29.4 28.7 28.1  ?MCHC 32.2 31.3 30.8  ?RDW 15.4 15.4 15.3  ?PLT 344 315 336  ? ?Thyroid  No results for input(s): TSH, FREET4 in the last 168 hours.  ?BNPNo results for input(s): BNP, PROBNP in the last 168 hours.  ?DDimer No results for input(s): DDIMER in the last 168 hours.  ? ?Radiology  ?  ? ?Cardiac Studies  ? ?Echocardiogram: 06/18/2021 ?IMPRESSIONS  ? ? ? 1. Left ventricular ejection fraction, by estimation, is 50 to 55%. The  ?left ventricle has low normal function. The left ventricle has no regional  ?wall motion abnormalities. There is moderate concentric left ventricular  ?hypertrophy. Diastolic function  ?indeterminant due to AFib.  ? 2. Right ventricular systolic function is normal. The right ventricular  ?size is mildly enlarged. There is normal pulmonary artery systolic  ?pressure. The estimated right ventricular systolic pressure is 13.2 mmHg.  ? 3. Left atrial size was moderately dilated.  ? 4. The mitral valve is normal in structure. Trivial mitral valve  ?regurgitation.  ? 5. The aortic valve is tricuspid. There is mild calcification of the  ?aortic valve. There is mild thickening of the aortic valve. Aortic valve  ?regurgitation is trivial. Aortic valve  sclerosis/calcification is present,  ?without any evidence of aortic  ?stenosis.  ? 6. The inferior vena cava is dilated in size with >50% respiratory  ?variability, suggesting right atrial pressure of 8 mmHg.  ? ?Comparison(s): Compared to prior TTE in 06/2020. The LVEF appears slightly  ?worse at 50-55% (previously 55-60%). Otherwise, no significant change. ? ?Patient Profile  ?   ?71 y.o. male w/ PMH of paroxysmal atrial fibrillation/flutter (initialy occurring in 2013 with recurrence in 06/2020 in the setting of COVID-19 --> s/p DCCV in 07/2020), HTN, Type 2 DM, Stage 3 CKD and OSA (not on CPAP) who was directly admitted from the office on 06/16/2021 for atrial flutter with RVR. ? ?Assessment & Plan  ?  ?1. Persistent Atrial Flutter ?- He was evaluated in the office on the day of admission and found to be in atrial flutter with RVR but was not a candidate for DCCV as he had missed doses of Eliquis. ?- He initially required IV Cardizem but has been transitioned to PO Cardizem CD 360 mg daily along with being started on Lopressor 50 mg twice daily. Given significant improvement in rate since starting Lopressor, would not further adjust today. He is on Losartan and Hydralazine as well and these can be reduced if BP becomes soft due to the use of AV nodal blocking agents. ?- Would ultimately plan for a DCCV following 3 weeks of uninterrupted anticoagulation. Continue Eliquis '5mg'$  BID for anticoagulation.  ? ?2. HTN ?- BP has been stable at 117/95 -136/87 within the past 24 hours.  Continue Cardizem CD, Hydralazine, Losartan and Lopressor at current dosing. ? ?3. HLD ?- He has been continued on Lipitor 20 mg daily. ? ?4. Sleep Disordered Breathing ?- He previously declined a repeat sleep study due to prior intolerances to CPAP due to claustrophobia. Given his recurrent arrhythmias, would strongly recommend a repeat sleep study as an outpatient. ? ?For questions or updates, please contact [facility] ?Please consult  [email or web address] for contact info under  ? ?  ?   ?Signed, ?Erma Heritage, PA-C  ?06/19/2021, 9:09 AM   ? ?? Ready for D/c discussed importance of uninterrupted anticoagulation to avoid stroke. Exam with overweight black male Lungs clear distant heart sounds plus one bilateral edema. Plan [facility] in about 3 weeks pending compliance will arrange outpatient f/u with Dr Harl Bowie ? ?Jenkins Rouge MD [facility] ? ?

## 2021-07-03 ENCOUNTER — Ambulatory Visit: Payer: Medicare PPO | Admitting: Student

## 2021-07-14 DIAGNOSIS — I480 Paroxysmal atrial fibrillation: Secondary | ICD-10-CM | POA: Diagnosis not present

## 2021-07-14 DIAGNOSIS — Z125 Encounter for screening for malignant neoplasm of prostate: Secondary | ICD-10-CM | POA: Diagnosis not present

## 2021-07-14 DIAGNOSIS — I4892 Unspecified atrial flutter: Secondary | ICD-10-CM | POA: Diagnosis not present

## 2021-07-14 DIAGNOSIS — E1165 Type 2 diabetes mellitus with hyperglycemia: Secondary | ICD-10-CM | POA: Diagnosis not present

## 2021-07-14 DIAGNOSIS — I10 Essential (primary) hypertension: Secondary | ICD-10-CM | POA: Diagnosis not present

## 2021-07-14 NOTE — Progress Notes (Signed)
Cardiology Office Note    Date:  07/28/2021   ID:  Kevin Goodman, DOB 04-19-1950, MRN [id number]   PCP:  Carrolyn Meiers, [place]  Cardiologist:  Carlyle Dolly, MD   Advanced Practice Provider:  No care team member to display Electrophysiologist:  None   [phone]   Chief Complaint  Patient presents with   Follow-up    History of Present Illness:  Kevin Goodman is a 71 y.o. male  with history of paroxysmal atrial fib/flutter (initially dx in 2013 with recurrence of atrial flutter 06/2020 in setting of Covid s/p DCCV 07/2020, HTN, DM, CKD stage 3a, OSA (not on CPAP), chronic edema, mild pulmonary HTN by echo, morbid obesity who is seen for follow-up today, last OV 09/2020. Last echo 06/2020 showed EF 55-60%, moderate LVH, mild pulm HTN.   Patient was seen in the office 06/16/2021 and found to be in rapid atrial flutter.  He was admitted to the hospital and rate was better controlled with metoprolol.  He had missed some Eliquis doses so could not be cardioverted.  After rate was controlled plan was to bring him back in 3 weeks for to set up for cardioversion.  Patient comes in for f/u. Feeling better. Not as short of breath and heart not racing. May have missed some eliquis and metoprolol so can't schedule  DCCV yet.    Past Medical History:  Diagnosis Date   Atrial fibrillation ([place])    a. initialy occurring in 2013  b. recurrence in 06/2020 in the setting of COVID-19 --> s/p DCCV in 07/2020   Chronic kidney disease, stage 3a ([place])    Diabetes mellitus, type 2 ([place])    GERD (gastroesophageal reflux disease)    Gout    Hypertension    Mild pulmonary hypertension ([place])    Morbid obesity (H[facility])    OSA (obstructive sleep apnea)    Paroxysmal atrial flutter ([place])    Sleep apnea     Past Surgical History:  Procedure Laterality Date   BALLOON DILATION N/A 12/25/2020   Procedure: BALLOON DILATION;  Surgeon: Eloise Harman, DO;   [facility];  Service: Endoscopy;  Laterality: N/A;   BIOPSY  12/25/2020   Procedure: BIOPSY;  Surgeon: Eloise Harman, DO;  [facility];  Service: Endoscopy;;   CARDIOVERSION N/A 07/09/2020   Procedure: CARDIOVERSION;  Surgeon: Arnoldo Lenis, MD;  Location: [facility];  Service: Endoscopy;  Laterality: N/A;   COLONOSCOPY N/A 12/08/2016   sigmoid and descending colon diverticula, four semi-pedunculated polyps in descending and ascendign colon, few polyps in descending and ascending colon (tubular adenomas). 3 year surveillance.    COLONOSCOPY WITH PROPOFOL N/A 12/25/2020   Procedure: COLONOSCOPY WITH PROPOFOL;  Surgeon: Eloise Harman, DO;  [facility];  Service: Endoscopy;  Laterality: N/A;  9:30am   ESOPHAGOGASTRODUODENOSCOPY N/A 12/08/2016   normal esophagus s/p empiric dilation, small hiatal hernia.    ESOPHAGOGASTRODUODENOSCOPY (EGD) WITH PROPOFOL N/A 12/25/2020   Procedure: ESOPHAGOGASTRODUODENOSCOPY (EGD) WITH PROPOFOL;  Surgeon: Eloise Harman, DO;  [facility];  Service: Endoscopy;  Laterality: N/A;   FINGER SURGERY     left ring   INCISION AND DRAINAGE OF WOUND Left 04/20/2016   Procedure: LEFT THIGH DEBRIDEMENT;  Surgeon: Mickeal Skinner, MD;  Location: [facility];  Service: General;  Laterality: Left;   MALONEY DILATION N/A 12/08/2016   Procedure: Keturah Shavers;  Surgeon: Daneil Dolin,  MD;  [facility];  Service: Endoscopy;  Laterality: N/A;   POLYPECTOMY  12/08/2016   Procedure: POLYPECTOMY;  Surgeon: Daneil Dolin, MD;  [facility];  Service: Endoscopy;;  colon   POLYPECTOMY  12/25/2020   Procedure: POLYPECTOMY;  Surgeon: Eloise Harman, DO;  [facility];  Service: Endoscopy;;    Current Medications: Current Meds  Medication Sig   apixaban (ELIQUIS) 5 MG TABS tablet Take 1 tablet (5 mg total) by mouth 2 (two) times daily.   atorvastatin (LIPITOR) 20 MG tablet Take 20 mg by  mouth daily.   diltiazem (CARDIZEM CD) 360 MG 24 hr capsule Take 1 capsule (360 mg total) by mouth daily.   hydrALAZINE (APRESOLINE) 100 MG tablet Take 100 mg by mouth 2 (two) times daily.   metFORMIN (GLUCOPHAGE) 500 MG tablet Take 500 mg by mouth 2 (two) times daily with a meal.   metoprolol tartrate (LOPRESSOR) 50 MG tablet Take 1 tablet (50 mg total) by mouth 2 (two) times daily.   naproxen (NAPROSYN) 500 MG tablet Take 500 mg by mouth 2 (two) times daily as needed.   oxymetazoline (AFRIN) 0.05 % nasal spray Place 1 spray into both nostrils 2 (two) times daily as needed for congestion.   TRESIBA FLEXTOUCH 100 UNIT/ML FlexTouch Pen Inject 40 Units into the skin daily.     Allergies:   Patient has no known allergies.   Social History   Socioeconomic History   Marital status: Widowed    Spouse name: Not on file   Number of children: Not on file   Years of education: Not on file   Highest education level: Not on file  Occupational History   Occupation: Security    Employer: [employer]  Tobacco Use   Smoking status: Some Days    Types: Cigars    Last attempt to quit: 07/08/2018    Years since quitting: 3.0   Smokeless tobacco: Never   Tobacco comments:    smoked 2-3 cigars daily  Vaping Use   Vaping Use: Never used  Substance and Sexual Activity   Alcohol use: Not Currently    Comment: occasional shot of liquour every now and then    Drug use: No   Sexual activity: Yes  Other Topics Concern   Not on file  Social History Narrative   Not on file   Social Determinants of Health   Financial Resource Strain: Not on file  Food Insecurity: Not on file  Transportation Needs: Not on file  Physical Activity: Not on file  Stress: Not on file  Social Connections: Not on file     Family History:  The patient's  family history includes Heart failure in his mother; Hypertension in his father and mother; Prostate cancer in his brother and father; Stroke in his brother and  father.   ROS:   Please see the history of present illness.    ROS All other systems reviewed and are negative.   PHYSICAL EXAM:   VS:  BP 120/78   Pulse 75   Wt (!) 380 lb 3.2 oz (172.5 kg)   BMI 50.16 kg/m   Physical Exam  GEN: Obese, in no acute distress  Neck: no JVD, carotid bruits, or masses Cardiac:RRR; no murmurs, rubs, or gallops  Respiratory:  clear to auscultation bilaterally, normal work of breathing GI: soft, nontender, nondistended, + BS Ext: without cyanosis, clubbing, or edema, Good distal pulses bilaterally Neuro:  Alert and Oriented x  3,  Psych: euthymic mood, full affect  Wt Readings from Last 3 Encounters:  07/28/21 (!) 380 lb 3.2 oz (172.5 kg)  06/19/21 (!) 374 lb 9 oz (169.9 kg)  06/16/21 (!) 381 lb (172.8 kg)      Studies/Labs Reviewed:   EKG:  EKG is  ordered today.  The ekg ordered today demonstrates Atrial flutter RVH inf/lat ST changes.  Recent Labs: 12/23/2020: ALT 17 06/16/2021: Magnesium 2.0 06/18/2021: BUN 18; Creatinine, Ser 1.30; Hemoglobin 13.2; Platelets 336; Potassium 4.0; Sodium 139   Lipid Panel    Component Value Date/Time   CHOL 85 06/17/2021 0339   TRIG 79 06/17/2021 0339   HDL 26 (L) 06/17/2021 0339   CHOLHDL 3.3 06/17/2021 0339   VLDL 16 06/17/2021 0339   LDLCALC 43 06/17/2021 0339    Additional studies/ records that were reviewed today include:  Echocardiogram: 06/18/2021 IMPRESSIONS     1. Left ventricular ejection fraction, by estimation, is 50 to 55%. The  left ventricle has low normal function. The left ventricle has no regional  wall motion abnormalities. There is moderate concentric left ventricular  hypertrophy. Diastolic function  indeterminant due to AFib.   2. Right ventricular systolic function is normal. The right ventricular  size is mildly enlarged. There is normal pulmonary artery systolic  pressure. The estimated right ventricular systolic pressure is 22.9 mmHg.   3. Left atrial size was  moderately dilated.   4. The mitral valve is normal in structure. Trivial mitral valve  regurgitation.   5. The aortic valve is tricuspid. There is mild calcification of the  aortic valve. There is mild thickening of the aortic valve. Aortic valve  regurgitation is trivial. Aortic valve sclerosis/calcification is present,  without any evidence of aortic  stenosis.   6. The inferior vena cava is dilated in size with >50% respiratory  variability, suggesting right atrial pressure of 8 mmHg.   Comparison(s): Compared to prior TTE in 06/2020. The LVEF appears slightly  worse at 50-55% (previously 55-60%). Otherwise, no significant change.   Risk Assessment/Calculations:    CHA2DS2-VASc Score = 2   This indicates a 2.2% annual risk of stroke. The patient's score is based upon: CHF History: 0 HTN History: 1 Diabetes History: 0 Stroke History: 0 Vascular Disease History: 0 Age Score: 1 Gender Score: 0        ASSESSMENT:    1. Atrial flutter with rapid ventricular response ([place])   2. Essential hypertension   3. Hyperlipidemia, unspecified hyperlipidemia type   4. OSA (obstructive sleep apnea)   5. Morbid obesity ([place])      PLAN:  In order of problems listed above:   Persistent Atrial Flutter - He was evaluated in the office on the day of admission and found to be in atrial flutter with RVR but was not a candidate for DCCV as he had missed doses of Eliquis. - He initially required IV Cardizem but has been transitioned to PO Cardizem CD 360 mg daily along with being started on Lopressor 50 mg twice daily. Patient back today to be scheduled for DCCV but again thinks he may have missed some eliquis and metoprolol doses. I stressed importance of compliance and risk of stroke with DCCV if he misses a dose. We have given him med boxes to help him keep better track. Will bring back in 3 weeks to see if we can schedule DCCV. HR much better controlled today. Continue Eliquis '5mg'$  BID for  anticoagulation.    HTN -  BP has been stable Continue Cardizem CD, Hydralazine, Losartan and Lopressor at current dosing.   HLD - He has been continued on Lipitor 20 mg daily.   Sleep Disordered Breathing - He previously declined a repeat sleep study due to prior intolerances to CPAP due to claustrophobia. Given his recurrent arrhythmias, he is agreeable to another sleep study and then referral to ENT to see if he qualifies for a sleep device.  Obesity-weight loss essential to overall health    Shared Decision Making/Informed Consent        Medication Adjustments/Labs and Tests Ordered: Current medicines are reviewed at length with the patient today.  Concerns regarding medicines are outlined above.  Medication changes, Labs and Tests ordered today are listed in the Patient Instructions below. Patient Instructions  Medication Instructions:  Your physician recommends that you continue on your current medications as directed. Please refer to the Current Medication list given to you today.  *If you need a refill on your cardiac medications before your next appointment, please call your pharmacy*   Lab Work: NONE   If you have labs (blood work) drawn today and your tests are completely normal, you will receive your results only by: [place] (if you have MyChart) OR A paper copy in the mail If you have any lab test that is abnormal or we need to change your treatment, we will call you to review the results.   Testing/Procedures: Your physician has recommended that you have a sleep study. This test records several body functions during sleep, including: brain activity, eye movement, oxygen and carbon dioxide blood levels, heart rate and rhythm, breathing rate and rhythm, the flow of air through your mouth and nose, snoring, body muscle movements, and chest and belly movement.     Follow-Up: At [facility], you and your health needs are our priority.  As part of our  continuing mission to provide you with exceptional heart care, we have created designated Provider Care Teams.  These Care Teams include your primary Cardiologist (physician) and Advanced Practice Providers (APPs -  Physician Assistants and Nurse Practitioners) who all work together to provide you with the care you need, when you need it.  We recommend signing up for the patient portal called "MyChart".  Sign up information is provided on this After Visit Summary.  MyChart is used to connect with patients for Virtual Visits (Telemedicine).  Patients are able to view lab/test results, encounter notes, upcoming appointments, etc.  Non-urgent messages can be sent to your provider as well.   To learn more about what you can do with MyChart, go to [email or web address].    Your next appointment:   3 week(s)  The format for your next appointment:   In Person  Provider:   You may see Carlyle Dolly, MD or one of the following Advanced Practice Providers on your designated Care Team:   Bernerd Pho, PA-C  Ermalinda Barrios, [place]     Other Instructions Thank you for choosing [facility]!    Important Information About [place]         Sumner Boast, PA-C  07/28/2021 1:47 PM    [facility] [address], [place], [place]  [address] Phone: [phone]; Fax: [phone]

## 2021-07-17 DIAGNOSIS — Z125 Encounter for screening for malignant neoplasm of prostate: Secondary | ICD-10-CM | POA: Diagnosis not present

## 2021-07-17 DIAGNOSIS — R351 Nocturia: Secondary | ICD-10-CM | POA: Diagnosis not present

## 2021-07-28 ENCOUNTER — Ambulatory Visit: Payer: Medicare PPO | Admitting: Physician Assistant

## 2021-07-28 ENCOUNTER — Encounter: Payer: Self-pay | Admitting: Physician Assistant

## 2021-07-28 VITALS — BP 120/78 | HR 75 | Wt 380.2 lb

## 2021-07-28 DIAGNOSIS — I4892 Unspecified atrial flutter: Secondary | ICD-10-CM | POA: Diagnosis not present

## 2021-07-28 DIAGNOSIS — G4733 Obstructive sleep apnea (adult) (pediatric): Secondary | ICD-10-CM

## 2021-07-28 DIAGNOSIS — I10 Essential (primary) hypertension: Secondary | ICD-10-CM | POA: Diagnosis not present

## 2021-07-28 DIAGNOSIS — E785 Hyperlipidemia, unspecified: Secondary | ICD-10-CM | POA: Diagnosis not present

## 2021-07-28 NOTE — Patient Instructions (Signed)
Medication Instructions:  Your physician recommends that you continue on your current medications as directed. Please refer to the Current Medication list given to you today.  *If you need a refill on your cardiac medications before your next appointment, please call your pharmacy*   Lab Work: NONE   If you have labs (blood work) drawn today and your tests are completely normal, you will receive your results only by: [place] (if you have MyChart) OR A paper copy in the mail If you have any lab test that is abnormal or we need to change your treatment, we will call you to review the results.   Testing/Procedures: Your physician has recommended that you have a sleep study. This test records several body functions during sleep, including: brain activity, eye movement, oxygen and carbon dioxide blood levels, heart rate and rhythm, breathing rate and rhythm, the flow of air through your mouth and nose, snoring, body muscle movements, and chest and belly movement.     Follow-Up: At [facility], you and your health needs are our priority.  As part of our continuing mission to provide you with exceptional heart care, we have created designated Provider Care Teams.  These Care Teams include your primary Cardiologist (physician) and Advanced Practice Providers (APPs -  Physician Assistants and Nurse Practitioners) who all work together to provide you with the care you need, when you need it.  We recommend signing up for the patient portal called "MyChart".  Sign up information is provided on this After Visit Summary.  MyChart is used to connect with patients for Virtual Visits (Telemedicine).  Patients are able to view lab/test results, encounter notes, upcoming appointments, etc.  Non-urgent messages can be sent to your provider as well.   To learn more about what you can do with MyChart, go to [email or web address].    Your next appointment:   3 week(s)  The format for your next  appointment:   In Person  Provider:   You may see Carlyle Dolly, MD or one of the following Advanced Practice Providers on your designated Care Team:   Bernerd Pho, PA-C  Ermalinda Barrios, [place]     Other Instructions Thank you for choosing [facility]!    Important Information About Sugar

## 2021-08-04 ENCOUNTER — Telehealth: Payer: Self-pay | Admitting: *Deleted

## 2021-08-04 NOTE — Telephone Encounter (Signed)
No PA Req ok to schedule

## 2021-08-05 NOTE — Telephone Encounter (Signed)
Pt notified of Kevin Goodman sleep study code. States he will do sleep study tomorrow night.

## 2021-08-05 NOTE — Telephone Encounter (Signed)
Looks like was ordered in [place]. I will forward to [place] office

## 2021-08-05 NOTE — Telephone Encounter (Signed)
Kevin Goodman Sleep Study- Attempted to contact pt- no answer/voicemail box full.

## 2021-08-07 NOTE — Telephone Encounter (Signed)
Spoke to pt who did not download the app for [place] [facility] before he left his appt. Pt req'd to come in 6/6 at 4pm to have nurse assist him in setting up application.

## 2021-08-07 NOTE — Telephone Encounter (Signed)
Patient is following up. He would like to know if he will need to use his phone for anything during the sleep study. Please advise.

## 2021-08-14 DIAGNOSIS — I10 Essential (primary) hypertension: Secondary | ICD-10-CM | POA: Diagnosis not present

## 2021-08-14 DIAGNOSIS — I480 Paroxysmal atrial fibrillation: Secondary | ICD-10-CM | POA: Diagnosis not present

## 2021-08-19 ENCOUNTER — Ambulatory Visit: Payer: Medicare PPO | Admitting: Student

## 2021-08-19 NOTE — Progress Notes (Deleted)
Cardiology Office Note    Date:  08/19/2021   ID:  Kevin Goodman, DOB [birth year], MRN [id number]  PCP:  Carrolyn Meiers, MD  Cardiologist: Carlyle Dolly, MD    No chief complaint on file.   History of Present Illness:    Kevin Goodman is a 71 y.o. male with past medical history of paroxysmal atrial flutter (initially diagnosed in 2013 with recurrence in 06/2020 while admitted for COVID 19 and underwent DCCV in 07/2020), HTN, HLD, Type II DM, Stage III CKD and OSA (not on CPAP) who presents to the office today for 3-week follow-up.  He was directly admitted to [facility] from the office in 06/2021 for atrial flutter with RVR and rate significantly improved with being started on Lopressor 50 mg twice daily along with Cardizem CD 360 mg daily. He had been off anticoagulation and it was recommended to arrange for outpatient DCCV following 3 weeks of uninterrupted anticoagulation. He did follow-up with Ermalinda Barrios, PA-C on 07/28/2021 and reported his dyspnea had improved and he had not experienced any palpitations. Did report missing doses of Eliquis and metoprolol, therefore it was recommended to bring him back in to the office in 3 weeks to see if DCCV could be arranged. It was also recommended to arrange for an [place] sleep study.     Past Medical History:  Diagnosis Date   Atrial fibrillation ([place])    a. initialy occurring in 2013  b. recurrence in 06/2020 in the setting of COVID-19 --> s/p DCCV in 07/2020   Chronic kidney disease, stage 3a ([place])    Diabetes mellitus, type 2 ([place])    GERD (gastroesophageal reflux disease)    Gout    Hypertension    Mild pulmonary hypertension ([place])    Morbid obesity ([facility])    OSA (obstructive sleep apnea)    Paroxysmal atrial flutter ([place])    Sleep apnea     Past Surgical History:  Procedure Laterality Date   BALLOON DILATION N/A 12/25/2020   Procedure: BALLOON DILATION;  Surgeon: Eloise Harman, DO;  Location[facility];  Service: Endoscopy;  Laterality: N/A;   BIOPSY  12/25/2020   Procedure: BIOPSY;  Surgeon: Eloise Harman, DO;  Location[facility];  Service: Endoscopy;;   CARDIOVERSION N/A 07/09/2020   Procedure: CARDIOVERSION;  Surgeon: Arnoldo Lenis, MD;  Location: [facility] ORS;  Service: Endoscopy;  Laterality: N/A;   COLONOSCOPY N/A 12/08/2016   sigmoid and descending colon diverticula, four semi-pedunculated polyps in descending and ascendign colon, few polyps in descending and ascending colon (tubular adenomas). 3 year surveillance.    COLONOSCOPY WITH PROPOFOL N/A 12/25/2020   Procedure: COLONOSCOPY WITH PROPOFOL;  Surgeon: Eloise Harman, DO;  Location[facility];  Service: Endoscopy;  Laterality: N/A;  9:30am   ESOPHAGOGASTRODUODENOSCOPY N/A 12/08/2016   normal esophagus s/p empiric dilation, small hiatal hernia.    ESOPHAGOGASTRODUODENOSCOPY (EGD) WITH PROPOFOL N/A 12/25/2020   Procedure: ESOPHAGOGASTRODUODENOSCOPY (EGD) WITH PROPOFOL;  Surgeon: Eloise Harman, DO;  [facility];  Service: Endoscopy;  Laterality: N/A;   FINGER SURGERY     left ring   INCISION AND DRAINAGE OF WOUND Left 04/20/2016   Procedure: LEFT THIGH DEBRIDEMENT;  Surgeon: Mickeal Skinner, MD;  Location: [place];  Service: General;  Laterality: Left;   MALONEY DILATION N/A 12/08/2016   Procedure: Keturah Shavers;  Surgeon: Daneil Dolin, MD;  Location[facility];  Service: Endoscopy;  Laterality: N/A;  POLYPECTOMY  12/08/2016   Procedure: POLYPECTOMY;  Surgeon: Daneil Dolin, MD;  [facility];  Service: Endoscopy;;  colon   POLYPECTOMY  12/25/2020   Procedure: POLYPECTOMY;  Surgeon: Eloise Harman, DO;  [facility];  Service: Endoscopy;;    Current Medications: Outpatient Medications Prior to Visit  Medication Sig Dispense Refill   apixaban (ELIQUIS) 5 MG TABS tablet Take 1 tablet (5 mg total) by mouth 2 (two) times daily. 60 tablet 1   atorvastatin  (LIPITOR) 20 MG tablet Take 20 mg by mouth daily.     diltiazem (CARDIZEM CD) 360 MG 24 hr capsule Take 1 capsule (360 mg total) by mouth daily. 30 capsule 1   furosemide (LASIX) 20 MG tablet Take 1 tablet (20 mg total) by mouth daily. 90 tablet 3   hydrALAZINE (APRESOLINE) 100 MG tablet Take 100 mg by mouth 2 (two) times daily.     losartan (COZAAR) 100 MG tablet Take 1 tablet (100 mg total) by mouth daily. 90 tablet 3   metFORMIN (GLUCOPHAGE) 500 MG tablet Take 500 mg by mouth 2 (two) times daily with a meal.     metoprolol tartrate (LOPRESSOR) 50 MG tablet Take 1 tablet (50 mg total) by mouth 2 (two) times daily. 60 tablet 1   naproxen (NAPROSYN) 500 MG tablet Take 500 mg by mouth 2 (two) times daily as needed.     oxymetazoline (AFRIN) 0.05 % nasal spray Place 1 spray into both nostrils 2 (two) times daily as needed for congestion.     pantoprazole (PROTONIX) 40 MG tablet Take 1 tablet (40 mg total) by mouth 2 (two) times daily before a meal. 30 minutes before breakfast 180 tablet 1   TRESIBA FLEXTOUCH 100 UNIT/ML FlexTouch Pen Inject 40 Units into the skin daily.     No facility-administered medications prior to visit.     Allergies:   Patient has no active allergies.   Social History   Socioeconomic History   Marital status: Widowed    Spouse name: Not on file   Number of children: Not on file   Years of education: Not on file   Highest education level: Not on file  Occupational History   Occupation: Security    Employer: [employer]  Tobacco Use   Smoking status: Some Days    Types: Cigars    Last attempt to quit: 07/08/2018    Years since quitting: 3.1   Smokeless tobacco: Never   Tobacco comments:    smoked 2-3 cigars daily  Vaping Use   Vaping Use: Never used  Substance and Sexual Activity   Alcohol use: Not Currently    Comment: occasional shot of liquour every now and then    Drug use: No   Sexual activity: Yes  Other Topics Concern   Not on file  Social  History Narrative   Not on file   Social Determinants of Health   Financial Resource Strain: Not on file  Food Insecurity: Not on file  Transportation Needs: Not on file  Physical Activity: Not on file  Stress: Not on file  Social Connections: Not on file     Family History:  The patient's ***family history includes Heart failure in his mother; Hypertension in his father and mother; Prostate cancer in his brother and father; Stroke in his brother and father.   Review of Systems:    Please see the history of present illness.     All other systems reviewed and  are otherwise negative except as noted above.   Physical Exam:    VS:  There were no vitals taken for this visit.   General: Well developed, well nourished,male appearing in no acute distress. Head: Normocephalic, atraumatic. Neck: No carotid bruits. JVD not elevated.  Lungs: Respirations regular and unlabored, without wheezes or rales.  Heart: ***Regular rate and rhythm. No S3 or S4.  No murmur, no rubs, or gallops appreciated. Abdomen: Appears non-distended. No obvious abdominal masses. Msk:  Strength and tone appear normal for age. No obvious joint deformities or effusions. Extremities: No clubbing or cyanosis. No edema.  Distal pedal pulses are 2+ bilaterally. Neuro: Alert and oriented X 3. Moves all extremities spontaneously. No focal deficits noted. Psych:  Responds to questions appropriately with a normal affect. Skin: No rashes or lesions noted  Wt Readings from Last 3 Encounters:  07/28/21 (!) 380 lb 3.2 oz (172.5 kg)  06/19/21 (!) 374 lb 9 oz (169.9 kg)  06/16/21 (!) 381 lb (172.8 kg)        Studies/Labs Reviewed:   EKG:  EKG is*** ordered today.  The ekg ordered today demonstrates ***  Recent Labs: 12/23/2020: ALT 17 06/16/2021: Magnesium 2.0 06/18/2021: BUN 18; Creatinine, Ser 1.30; Hemoglobin 13.2; Platelets 336; Potassium 4.0; Sodium 139   Lipid Panel    Component Value Date/Time   CHOL 85  06/17/2021 0339   TRIG 79 06/17/2021 0339   HDL 26 (L) 06/17/2021 0339   CHOLHDL 3.3 06/17/2021 0339   VLDL 16 06/17/2021 0339   LDLCALC 43 06/17/2021 0339    Additional studies/ records that were reviewed today include:   Echocardiogram: 06/2021 IMPRESSIONS     1. Left ventricular ejection fraction, by estimation, is 50 to 55%. The  left ventricle has low normal function. The left ventricle has no regional  wall motion abnormalities. There is moderate concentric left ventricular  hypertrophy. Diastolic function  indeterminant due to AFib.   2. Right ventricular systolic function is normal. The right ventricular  size is mildly enlarged. There is normal pulmonary artery systolic  pressure. The estimated right ventricular systolic pressure is 62.2 mmHg.   3. Left atrial size was moderately dilated.   4. The mitral valve is normal in structure. Trivial mitral valve  regurgitation.   5. The aortic valve is tricuspid. There is mild calcification of the  aortic valve. There is mild thickening of the aortic valve. Aortic valve  regurgitation is trivial. Aortic valve sclerosis/calcification is present,  without any evidence of aortic  stenosis.   6. The inferior vena cava is dilated in size with >50% respiratory  variability, suggesting right atrial pressure of 8 mmHg.   Comparison(s): Compared to prior TTE in 06/2020. The LVEF appears slightly  worse at 50-55% (previously 55-60%). Otherwise, no significant change.   Assessment:    No diagnosis found.   Plan:   In order of problems listed above:  ***    Shared Decision Making/Informed Consent:   {Are you ordering a CV Procedure (e.g. stress test, cath, DCCV, TEE, etc)?   Press F2        :[id number]}    Medication Adjustments/Labs and Tests Ordered: Current medicines are reviewed at length with the patient today.  Concerns regarding medicines are outlined above.  Medication changes, Labs and Tests ordered today are  listed in the Patient Instructions below. There are no Patient Instructions on file for this visit.   Signed, Erma Heritage, PA-C  08/19/2021 7:33 AM  [facility] [address] [place], [place] [address] Phone: [phone] Fax: [phone]

## 2021-09-13 DIAGNOSIS — E1165 Type 2 diabetes mellitus with hyperglycemia: Secondary | ICD-10-CM | POA: Diagnosis not present

## 2021-09-13 DIAGNOSIS — I10 Essential (primary) hypertension: Secondary | ICD-10-CM | POA: Diagnosis not present

## 2021-10-14 DIAGNOSIS — I10 Essential (primary) hypertension: Secondary | ICD-10-CM | POA: Diagnosis not present

## 2021-10-14 DIAGNOSIS — E1165 Type 2 diabetes mellitus with hyperglycemia: Secondary | ICD-10-CM | POA: Diagnosis not present

## 2021-11-14 DIAGNOSIS — E1165 Type 2 diabetes mellitus with hyperglycemia: Secondary | ICD-10-CM | POA: Diagnosis not present

## 2021-11-14 DIAGNOSIS — I10 Essential (primary) hypertension: Secondary | ICD-10-CM | POA: Diagnosis not present

## 2021-12-08 ENCOUNTER — Other Ambulatory Visit: Payer: Self-pay

## 2021-12-08 ENCOUNTER — Ambulatory Visit
Admission: EM | Admit: 2021-12-08 | Discharge: 2021-12-08 | Disposition: A | Discharge status: Home / Self Care | Payer: Medicare PPO | Attending: Nurse Practitioner | Admitting: Nurse Practitioner

## 2021-12-08 ENCOUNTER — Inpatient Hospital Stay (HOSPITAL_COMMUNITY)
Admission: EM | Admit: 2021-12-08 | Discharge: 2021-12-12 | DRG: 683 | Disposition: A | Discharge status: Home / Self Care | Payer: Medicare PPO | Attending: Internal Medicine | Admitting: Internal Medicine

## 2021-12-08 ENCOUNTER — Encounter (HOSPITAL_COMMUNITY): Payer: Self-pay

## 2021-12-08 ENCOUNTER — Encounter: Payer: Self-pay | Admitting: Emergency Medicine

## 2021-12-08 ENCOUNTER — Emergency Department (HOSPITAL_COMMUNITY): Payer: Medicare PPO

## 2021-12-08 DIAGNOSIS — E66813 Obesity, class 3: Secondary | ICD-10-CM | POA: Diagnosis present

## 2021-12-08 DIAGNOSIS — Z20822 Contact with and (suspected) exposure to covid-19: Secondary | ICD-10-CM | POA: Diagnosis present

## 2021-12-08 DIAGNOSIS — Z91148 Patient's other noncompliance with medication regimen for other reason: Secondary | ICD-10-CM

## 2021-12-08 DIAGNOSIS — Z79899 Other long term (current) drug therapy: Secondary | ICD-10-CM

## 2021-12-08 DIAGNOSIS — R4182 Altered mental status, unspecified: Secondary | ICD-10-CM | POA: Diagnosis not present

## 2021-12-08 DIAGNOSIS — N1831 Chronic kidney disease, stage 3a: Secondary | ICD-10-CM | POA: Diagnosis present

## 2021-12-08 DIAGNOSIS — I10 Essential (primary) hypertension: Secondary | ICD-10-CM | POA: Diagnosis not present

## 2021-12-08 DIAGNOSIS — I4891 Unspecified atrial fibrillation: Secondary | ICD-10-CM

## 2021-12-08 DIAGNOSIS — I272 Pulmonary hypertension, unspecified: Secondary | ICD-10-CM | POA: Diagnosis present

## 2021-12-08 DIAGNOSIS — I129 Hypertensive chronic kidney disease with stage 1 through stage 4 chronic kidney disease, or unspecified chronic kidney disease: Secondary | ICD-10-CM | POA: Diagnosis present

## 2021-12-08 DIAGNOSIS — K219 Gastro-esophageal reflux disease without esophagitis: Secondary | ICD-10-CM | POA: Diagnosis present

## 2021-12-08 DIAGNOSIS — Z6841 Body Mass Index (BMI) 40.0 and over, adult: Secondary | ICD-10-CM | POA: Diagnosis not present

## 2021-12-08 DIAGNOSIS — E1121 Type 2 diabetes mellitus with diabetic nephropathy: Secondary | ICD-10-CM | POA: Diagnosis present

## 2021-12-08 DIAGNOSIS — R000 Tachycardia, unspecified: Secondary | ICD-10-CM | POA: Diagnosis not present

## 2021-12-08 DIAGNOSIS — Z7984 Long term (current) use of oral hypoglycemic drugs: Secondary | ICD-10-CM | POA: Diagnosis not present

## 2021-12-08 DIAGNOSIS — Z8616 Personal history of COVID-19: Secondary | ICD-10-CM | POA: Diagnosis not present

## 2021-12-08 DIAGNOSIS — E785 Hyperlipidemia, unspecified: Secondary | ICD-10-CM | POA: Diagnosis present

## 2021-12-08 DIAGNOSIS — Z8673 Personal history of transient ischemic attack (TIA), and cerebral infarction without residual deficits: Secondary | ICD-10-CM | POA: Diagnosis not present

## 2021-12-08 DIAGNOSIS — G4733 Obstructive sleep apnea (adult) (pediatric): Secondary | ICD-10-CM | POA: Diagnosis present

## 2021-12-08 DIAGNOSIS — E1169 Type 2 diabetes mellitus with other specified complication: Secondary | ICD-10-CM | POA: Diagnosis present

## 2021-12-08 DIAGNOSIS — Z823 Family history of stroke: Secondary | ICD-10-CM | POA: Diagnosis not present

## 2021-12-08 DIAGNOSIS — Z7901 Long term (current) use of anticoagulants: Secondary | ICD-10-CM | POA: Diagnosis not present

## 2021-12-08 DIAGNOSIS — N179 Acute kidney failure, unspecified: Secondary | ICD-10-CM | POA: Diagnosis not present

## 2021-12-08 DIAGNOSIS — Z8249 Family history of ischemic heart disease and other diseases of the circulatory system: Secondary | ICD-10-CM | POA: Diagnosis not present

## 2021-12-08 DIAGNOSIS — F17290 Nicotine dependence, other tobacco product, uncomplicated: Secondary | ICD-10-CM | POA: Diagnosis present

## 2021-12-08 DIAGNOSIS — I482 Chronic atrial fibrillation, unspecified: Secondary | ICD-10-CM | POA: Diagnosis present

## 2021-12-08 DIAGNOSIS — E119 Type 2 diabetes mellitus without complications: Secondary | ICD-10-CM | POA: Diagnosis present

## 2021-12-08 DIAGNOSIS — R531 Weakness: Secondary | ICD-10-CM | POA: Diagnosis not present

## 2021-12-08 DIAGNOSIS — E1122 Type 2 diabetes mellitus with diabetic chronic kidney disease: Secondary | ICD-10-CM | POA: Diagnosis present

## 2021-12-08 DIAGNOSIS — E871 Hypo-osmolality and hyponatremia: Secondary | ICD-10-CM | POA: Diagnosis present

## 2021-12-08 LAB — CBC
HCT: 44.7 % (ref 39.0–52.0)
Hemoglobin: 14.5 g/dL (ref 13.0–17.0)
MCH: 29.4 pg (ref 26.0–34.0)
MCHC: 32.4 g/dL (ref 30.0–36.0)
MCV: 90.7 fL (ref 80.0–100.0)
Platelets: 262 10*3/uL (ref 150–400)
RBC: 4.93 MIL/uL (ref 4.22–5.81)
RDW: 16.6 % — ABNORMAL HIGH (ref 11.5–15.5)
WBC: 19.7 10*3/uL — ABNORMAL HIGH (ref 4.0–10.5)
nRBC: 0 % (ref 0.0–0.2)

## 2021-12-08 LAB — POCT FASTING CBG KUC MANUAL ENTRY: POCT Glucose (KUC): 188 mg/dL — AB (ref 70–99)

## 2021-12-08 LAB — BASIC METABOLIC PANEL
Anion gap: 11 (ref 5–15)
BUN: 33 mg/dL — ABNORMAL HIGH (ref 8–23)
CO2: 24 mmol/L (ref 22–32)
Calcium: 9.5 mg/dL (ref 8.9–10.3)
Chloride: 101 mmol/L (ref 98–111)
Creatinine, Ser: 2.31 mg/dL — ABNORMAL HIGH (ref 0.61–1.24)
GFR, Estimated: 30 mL/min — ABNORMAL LOW (ref >60)
Glucose, Bld: 106 mg/dL — ABNORMAL HIGH (ref 70–99)
Potassium: 4.5 mmol/L (ref 3.5–5.1)
Sodium: 136 mmol/L (ref 135–145)

## 2021-12-08 MED ORDER — SODIUM CHLORIDE 0.9 % IV BOLUS
1000.0000 mL | Freq: Once | INTRAVENOUS | Status: AC
  Administered 2021-12-09: 1000 mL via INTRAVENOUS

## 2021-12-08 MED ORDER — LORAZEPAM 2 MG/ML IJ SOLN
0.5000 mg | Freq: Once | INTRAMUSCULAR | Status: AC | PRN
  Administered 2021-12-08: 0.5 mg via INTRAVENOUS
  Filled 2021-12-08: qty 1

## 2021-12-08 NOTE — ED Provider Notes (Signed)
[facility] EMERGENCY DEPARTMENT Provider Note   CSN: [id number] Arrival date & time: 12/08/21  1952     History  Chief Complaint  Patient presents with   Weakness    Kevin Goodman is a 71 y.o. male.  Patient presents to the hospital with concerns over atrial fibrillation with RVR.  Patient states that last night he started to feel strange after leaving work and get confused on the way home.  He began to feel a generalized weakness.  Today he has felt more fatigued, short of breath, and feels "off".  Patient is denying chest pain, shortness of breath, difficulty breathing, headache, abdominal pain, nausea, vomiting, diarrhea, lower extremity weakness.  Endorses feeling tired when exerting himself.  The patient has a history of atrial fibrillation and takes Eliquis.  He also takes antihypertensive medications.  Patient is a type II diabetic.  He states he occasionally forgets to take his medications.  While at urgent care the patient was noted to be in A-fib with RVR.  He was advised to go to the emergency department for further evaluation and management.  Past medical history significant for A-fib, type 2 diabetes, paroxysmal atrial flutter, mild pulmonary hypertension, CKD stage IIIa, morbid obesity, GERD, hypertension  HPI     Home Medications Prior to Admission medications   Medication Sig Start Date End Date Taking? Authorizing Provider  apixaban (ELIQUIS) 5 MG TABS tablet Take 1 tablet (5 mg total) by mouth 2 (two) times daily. 06/19/21   Johnson, Clanford L, MD  atorvastatin (LIPITOR) 20 MG tablet Take 20 mg by mouth daily.    [provider]  diltiazem (CARDIZEM CD) 360 MG 24 hr capsule Take 1 capsule (360 mg total) by mouth daily. 06/19/21   Johnson, Clanford L, MD  furosemide (LASIX) 20 MG tablet Take 1 tablet (20 mg total) by mouth daily. 07/03/20 06/16/21  Strader, Fransisco Hertz, PA-C  hydrALAZINE (APRESOLINE) 100 MG tablet Take 100 mg by mouth 2 (two)  times daily.    [provider]  losartan (COZAAR) 100 MG tablet Take 1 tablet (100 mg total) by mouth daily. 10/27/20 06/16/21  Strader, Fransisco Hertz, PA-C  metFORMIN (GLUCOPHAGE) 500 MG tablet Take 500 mg by mouth 2 (two) times daily with a meal.    [provider]  metoprolol tartrate (LOPRESSOR) 50 MG tablet Take 1 tablet (50 mg total) by mouth 2 (two) times daily. 06/19/21   Johnson, Clanford L, MD  naproxen (NAPROSYN) 500 MG tablet Take 500 mg by mouth 2 (two) times daily as needed.    [provider]  oxymetazoline (AFRIN) 0.05 % nasal spray Place 1 spray into both nostrils 2 (two) times daily as needed for congestion.    [provider]  pantoprazole (PROTONIX) 40 MG tablet Take 1 tablet (40 mg total) by mouth 2 (two) times daily before a meal. 30 minutes before breakfast 12/25/20 06/23/21  Carver, Charles K, DO  TRESIBA FLEXTOUCH 100 UNIT/ML FlexTouch Pen Inject 40 Units into the skin daily. 05/06/20   [provider]      Allergies    Patient has no known allergies.    Review of Systems   Review of Systems  Respiratory:  Negative for shortness of breath.   Cardiovascular:  Negative for chest pain.  Gastrointestinal:  Negative for abdominal pain, nausea and vomiting.  Neurological:  Positive for weakness and light-headedness. Negative for syncope.  Psychiatric/Behavioral:  Positive for confusion.     Physical Exam Updated  Vital Signs BP (!) 114/59 (BP Location: Right Arm)   Pulse (!) 120   Temp 99.6 F (37.6 C) (Oral)   Resp (!) 25   Ht '6\' 1"'$  (1.854 m)   Wt (!) 172.4 kg   SpO2 97%   BMI 50.13 kg/m  Physical Exam Vitals and nursing note reviewed.  Constitutional:      General: He is not in acute distress.    Appearance: He is obese.  HENT:     Head: Normocephalic and atraumatic.     Mouth/Throat:     Mouth: Mucous membranes are moist.  Eyes:     Extraocular Movements: Extraocular movements intact.     Conjunctiva/sclera:  Conjunctivae normal.     Pupils: Pupils are equal, round, and reactive to light.  Cardiovascular:     Rate and Rhythm: Tachycardia present. Rhythm irregular.     Pulses: Normal pulses.  Pulmonary:     Effort: Pulmonary effort is normal. No respiratory distress.     Breath sounds: Normal breath sounds.  Abdominal:     Palpations: Abdomen is soft.  Musculoskeletal:        General: No signs of injury.     Cervical back: Normal range of motion and neck supple.  Skin:    General: Skin is warm and dry.     Capillary Refill: Capillary refill takes less than 2 seconds.  Neurological:     Mental Status: He is alert.     Comments: Alert, oriented, thought content appropriate. Speech fluent without evidence of aphasia. Able to follow 2 step commands without difficulty. Normal speed of response.  Cranial Nerves:  II:  Peripheral visual fields grossly normal, pupils, round, reactive to light III,IV, VI: ptosis not present, extra-ocular motions intact bilaterally  V,VII: smile symmetric, facial light touch sensation equal VIII: hearing grossly normal bilaterally  IX,X: midline uvula rise  XI: bilateral shoulder shrug equal and strong XII: midline tongue extension  Motor:  5/5 in upper and lower extremities bilaterally including strong and equal grip strength and dorsiflexion/plantar flexion Sensory: light touch normal in all extremities.  Cerebellar: normal finger-to-nose with bilateral upper extremities, pronator drift negative Gait: normal gait and balance      ED Results / Procedures / Treatments   Labs (all labs ordered are listed, but only abnormal results are displayed) Labs Reviewed  BASIC METABOLIC PANEL - Abnormal; Notable for the following components:      Result Value   Glucose, Bld 106 (*)    BUN 33 (*)    Creatinine, Ser 2.31 (*)    GFR, Estimated 30 (*)    All other components within normal limits  CBC - Abnormal; Notable for the following components:   WBC 19.7 (*)     RDW 16.6 (*)    All other components within normal limits  RESP PANEL BY RT-PCR (FLU A&B, COVID) ARPGX2  URINALYSIS, ROUTINE W REFLEX MICROSCOPIC  LACTIC ACID, PLASMA  LACTIC ACID, PLASMA  CBG MONITORING, ED    EKG None  Radiology CT Head Wo Contrast  Result Date: 12/08/2021 CLINICAL DATA:  Altered mental status EXAM: CT HEAD WITHOUT CONTRAST TECHNIQUE: Contiguous axial images were obtained from the base of the skull through the vertex without intravenous contrast. RADIATION DOSE REDUCTION: This exam was performed according to the departmental dose-optimization program which includes automated exposure control, adjustment of the mA and/or kV according to patient size and/or use of iterative reconstruction technique. COMPARISON:  None Available. FINDINGS: Brain: Areas of loss of  cortical gray-white differentiation in the posterior left hemisphere and within the left frontal lobe. No acute hemorrhage. No midline shift other mass effect. There is an old left cerebellar infarct Vascular: No abnormal hyperdensity of the major intracranial arteries or dural venous sinuses. No intracranial atherosclerosis. Skull: The visualized skull base, calvarium and extracranial soft tissues are normal. Sinuses/Orbits: No fluid levels or advanced mucosal thickening of the visualized paranasal sinuses. No mastoid or middle ear effusion. The orbits are normal. IMPRESSION: 1. 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 MRI recommended. 2. No acute hemorrhage. Electronically Signed   By: Ulyses Jarred M.D.   On: 12/08/2021 21:03    Procedures Procedures    Medications Ordered in ED Medications  LORazepam (ATIVAN) injection 0.5 mg (has no administration in time range)  sodium chloride 0.9 % bolus 1,000 mL (has no administration in time range)    ED Course/ Medical Decision Making/ A&P                            Medical Decision Making Amount and/or Complexity of Data Reviewed Labs: ordered. Radiology: ordered.  Risk Prescription drug management.   This patient presents to the ED for concern of Weakness, this involves an extensive number of treatment options, and is a complaint that carries with it a high risk of complications and morbidity.  The differential diagnosis includes atrial fibrillation, CVA, dehydration, ACS, others   Co morbidities that complicate the patient evaluation  Atrial fibrillation, uncontrolled type 2 diabetes mellitus, morbid obesity   Additional history obtained:  External records from outside source obtained and reviewed including urgent care notes from earlier today   Lab Tests:  I Ordered, and personally interpreted labs.  The pertinent results include:  Creatinine 2.31, WBC 19.7   Imaging Studies ordered:  I ordered imaging studies including CT head, MR brain after CT head results  I independently visualized and interpreted imaging which showed  1. Areas of loss of cortical gray-white differentiation in the  posterior left hemisphere and within the left frontal lobe,  consistent with acute to subacute infarcts. MRI recommended.  2. No acute hemorrhage.   I agree with the radiologist interpretation   Cardiac Monitoring: / EKG:  The patient was maintained on a cardiac monitor.  I personally viewed and interpreted the cardiac monitored which showed an underlying rhythm of: atrial fibrillation   Problem List / ED Course / Critical interventions / Medication management   I ordered medication including normal saline for rehydration    Test / Admission - Considered:  Patient care being transferred to Quincy Carnes, PA-C at shift handoff. Patient will need admission for further evaluation and management. Patient with leukocytosis and AKI on initial labs. May need neuro consult as well depending on MR brain results.           Final Clinical Impression(s) / ED Diagnoses Final diagnoses:  AKI (acute kidney injury) ([place])  Atrial fibrillation, unspecified type [facility])    Rx / DC Orders ED Discharge Orders     None         Ronny Bacon 12/08/21 2320    Davonna Belling, MD 12/14/21 1552

## 2021-12-08 NOTE — ED Provider Notes (Signed)
[facility]    CSN: [id number] Arrival date & time: 12/08/21  1744      History   Chief Complaint Chief Complaint  Patient presents with   Altered Mental Status    HPI Kevin Goodman is a 71 y.o. male.   The history is provided by the patient.   Patient presents for complaints of altered mental status that started last evening once he got off work.  Patient states that when he got off work, he states that one of his coworkers said he did not quite "seem right".  He states as he was driving home he was supposed to turn left but then turned right.  He states that he ended up in the [place] parking lot around 1145 and fell asleep in his car, getting home at 3 AM this morning.  He states he has been more fatigued, short of breath, and feels like something is not right.  Patient denies chest pain, shortness of breath, difficulty breathing, headache, lower extremity weakness, abdominal pain, nausea, vomiting, or diarrhea.  Patient states that he does take Eliquis for A-fib states that he has been on it for about a year.  He also states that he takes medications for his blood pressure.  He is a type II diabetic.  He does admit that he does forget to take his medications.  CBG was 188 in triage. Past Medical History:  Diagnosis Date   Atrial fibrillation ([place])    a. initialy occurring in 2013  b. recurrence in 06/2020 in the setting of COVID-19 --> s/p DCCV in 07/2020   Chronic kidney disease, stage 3a ([place])    Diabetes mellitus, type 2 ([place])    GERD (gastroesophageal reflux disease)    Gout    Hypertension    Mild pulmonary hypertension ([place])    Morbid obesity ([facility])    OSA (obstructive sleep apnea)    Paroxysmal atrial flutter ([place])    Sleep apnea     Patient Active Problem List   Diagnosis Date Noted   Elevated LFTs 12/08/2020   Constipation 12/08/2020   Pneumonia due to COVID-19 virus 06/11/2020   Uncontrolled type 2 diabetes mellitus with hyperglycemia, with  long-term current use of insulin ([place]) 06/11/2020   Atrial flutter with rapid ventricular response ([place])    Colon adenomas 06/10/2020   Gastroesophageal reflux disease 06/10/2020   A-fib ([place]) 06/10/2020   Dysphagia 11/11/2016   Encounter for screening colonoscopy 11/11/2016   Abscess 04/19/2016   Long term (current) use of anticoagulants 07/29/2011   Type 2 diabetes mellitus ([place]) 07/20/2011   Atrial fibrillation ([place]) 07/19/2011   Hypertension 07/19/2011   Morbid obesity ([place]) 07/19/2011    Past Surgical History:  Procedure Laterality Date   BALLOON DILATION N/A 12/25/2020   Procedure: BALLOON DILATION;  Surgeon: Eloise Harman, DO;  [facility];  Service: Endoscopy;  Laterality: N/A;   BIOPSY  12/25/2020   Procedure: BIOPSY;  Surgeon: Eloise Harman, DO;  [facility];  Service: Endoscopy;;   CARDIOVERSION N/A 07/09/2020   Procedure: CARDIOVERSION;  Surgeon: Arnoldo Lenis, MD;  Location: [facility];  Service: Endoscopy;  Laterality: N/A;   COLONOSCOPY N/A 12/08/2016   sigmoid and descending colon diverticula, four semi-pedunculated polyps in descending and ascendign colon, few polyps in descending and ascending colon (tubular adenomas). 3 year surveillance.    COLONOSCOPY WITH PROPOFOL N/A 12/25/2020   Procedure: COLONOSCOPY WITH PROPOFOL;  Surgeon: Eloise Harman, DO;  [facility];  Service: Endoscopy;  Laterality: N/A;  9:30am   ESOPHAGOGASTRODUODENOSCOPY N/A 12/08/2016   normal esophagus s/p empiric dilation, small hiatal hernia.    ESOPHAGOGASTRODUODENOSCOPY (EGD) WITH PROPOFOL N/A 12/25/2020   Procedure: ESOPHAGOGASTRODUODENOSCOPY (EGD) WITH PROPOFOL;  Surgeon: Eloise Harman, DO;  [facility];  Service: Endoscopy;  Laterality: N/A;   FINGER SURGERY     left ring   INCISION AND DRAINAGE OF WOUND Left 04/20/2016   Procedure: LEFT THIGH DEBRIDEMENT;  Surgeon: Mickeal Skinner, MD;  Location: [place];  Service: General;   Laterality: Left;   MALONEY DILATION N/A 12/08/2016   Procedure: Keturah Shavers;  Surgeon: Daneil Dolin, MD;  [facility];  Service: Endoscopy;  Laterality: N/A;   POLYPECTOMY  12/08/2016   Procedure: POLYPECTOMY;  Surgeon: Daneil Dolin, MD;  [facility];  Service: Endoscopy;;  colon   POLYPECTOMY  12/25/2020   Procedure: POLYPECTOMY;  Surgeon: Eloise Harman, DO;  [facility];  Service: Endoscopy;;       Home Medications    Prior to Admission medications   Medication Sig Start Date End Date Taking? Authorizing Provider  apixaban (ELIQUIS) 5 MG TABS tablet Take 1 tablet (5 mg total) by mouth 2 (two) times daily. 06/19/21   Johnson, Clanford L, MD  atorvastatin (LIPITOR) 20 MG tablet Take 20 mg by mouth daily.    [provider]  diltiazem (CARDIZEM CD) 360 MG 24 hr capsule Take 1 capsule (360 mg total) by mouth daily. 06/19/21   Johnson, Clanford L, MD  furosemide (LASIX) 20 MG tablet Take 1 tablet (20 mg total) by mouth daily. 07/03/20 06/16/21  Strader, Fransisco Hertz, PA-C  hydrALAZINE (APRESOLINE) 100 MG tablet Take 100 mg by mouth 2 (two) times daily.    [provider]  losartan (COZAAR) 100 MG tablet Take 1 tablet (100 mg total) by mouth daily. 10/27/20 06/16/21  Strader, Fransisco Hertz, PA-C  metFORMIN (GLUCOPHAGE) 500 MG tablet Take 500 mg by mouth 2 (two) times daily with a meal.    [provider]  metoprolol tartrate (LOPRESSOR) 50 MG tablet Take 1 tablet (50 mg total) by mouth 2 (two) times daily. 06/19/21   Johnson, Clanford L, MD  naproxen (NAPROSYN) 500 MG tablet Take 500 mg by mouth 2 (two) times daily as needed.    [provider]  oxymetazoline (AFRIN) 0.05 % nasal spray Place 1 spray into both nostrils 2 (two) times daily as needed for congestion.    [provider]  pantoprazole (PROTONIX) 40 MG tablet Take 1 tablet (40 mg total) by mouth 2 (two) times daily before a meal. 30 minutes before  breakfast 12/25/20 06/23/21  Carver, Charles K, DO  TRESIBA FLEXTOUCH 100 UNIT/ML FlexTouch Pen Inject 40 Units into the skin daily. 05/06/20   [provider]    Family History Family History  Problem Relation Age of Onset   Heart failure Mother    Hypertension Mother    Hypertension Father    Stroke Father    Prostate cancer Father    Stroke Brother    Prostate cancer Brother    Colon cancer Neg Hx    Colon polyps Neg Hx     Social History Social History   Tobacco Use   Smoking status: Some Days    Types: Cigars    Last attempt to quit: 07/08/2018    Years since quitting: 3.4   Smokeless tobacco: Never   Tobacco comments:  smoked 2-3 cigars daily  Vaping Use   Vaping Use: Never used  Substance Use Topics   Alcohol use: Not Currently    Comment: occasional shot of liquour every now and then    Drug use: No     Allergies   Patient has no active allergies.   Review of Systems Review of Systems Per HPI  Physical Exam Triage Vital Signs ED Triage Vitals  Enc Vitals Group     BP 12/08/21 1820 121/78     Pulse Rate 12/08/21 1820 (!) 101     Resp 12/08/21 1820 20     Temp 12/08/21 1820 98.5 F (36.9 C)     Temp Source 12/08/21 1820 Oral     SpO2 12/08/21 1820 94 %     Weight --      Height --      Head Circumference --      Peak Flow --      Pain Score 12/08/21 1821 0     Pain Loc --      Pain Edu? --      Excl. in [place]? --    No data found.  Updated Vital Signs BP 121/78 (BP Location: Right Arm)   Pulse (!) 101   Temp 98.5 F (36.9 C) (Oral)   Resp 20   SpO2 94%   Visual Acuity Right Eye Distance:   Left Eye Distance:   Bilateral Distance:    Right Eye Near:   Left Eye Near:    Bilateral Near:     Physical Exam Vitals and nursing note reviewed.  Constitutional:      General: He is not in acute distress. HENT:     Head: Normocephalic.     Right Ear: Tympanic membrane, ear canal and external ear normal.     Left Ear: Tympanic  membrane, ear canal and external ear normal.     Nose: Nose normal.     Mouth/Throat:     Mouth: Mucous membranes are moist.  Eyes:     Extraocular Movements: Extraocular movements intact.     Conjunctiva/sclera: Conjunctivae normal.     Pupils: Pupils are equal, round, and reactive to light.  Cardiovascular:     Rate and Rhythm: Tachycardia present.     Heart sounds: Normal heart sounds.  Pulmonary:     Effort: Pulmonary effort is normal. No respiratory distress.     Breath sounds: Normal breath sounds. No stridor. No wheezing, rhonchi or rales.  Abdominal:     General: Bowel sounds are normal. There is no distension.     Palpations: Abdomen is soft.     Tenderness: There is no abdominal tenderness. There is no right CVA tenderness, left CVA tenderness, guarding or rebound.  Musculoskeletal:     Cervical back: Normal range of motion.  Skin:    General: Skin is warm and dry.  Neurological:     General: No focal deficit present.     Mental Status: He is alert and oriented to person, place, and time.     GCS: GCS eye subscore is 4. GCS verbal subscore is 5. GCS motor subscore is 6.     Cranial Nerves: Cranial nerves 2-12 are intact. No facial asymmetry.     Sensory: Sensation is intact.     Motor: Motor function is intact.     Coordination: Finger-Nose-Finger Test normal.  Psychiatric:        Mood and Affect: Mood normal.  Behavior: Behavior normal.      UC Treatments / Results  Labs (all labs ordered are listed, but only abnormal results are displayed) Labs Reviewed  POCT FASTING CBG [facility] MANUAL ENTRY - Abnormal; Notable for the following components:      Result Value   POCT Glucose ([facility]) 188 (*)    All other components within normal limits    EKG: atrial fibrillation with RVR   Radiology No results found.  Procedures Procedures (including critical care time)  Medications Ordered in UC Medications - No data to display  Initial Impression / Assessment and  Plan / UC Course  I have reviewed the triage vital signs and the nursing notes.  Pertinent labs & imaging results that were available during my care of the patient were reviewed by me and considered in my medical decision making (see chart for details).  Patient presents for complaints of changes in his mental status that started last evening.  On exam, patient's vital signs are stable, he is in no acute distress, neurological exam is within normal limits at this time.  EKG was performed which showed fibrillation with rapid ventricular response.  Given the patient's recent change in his mentation and abnormal EKG, discussed with patient that following up in the emergency department for further evaluation would be appropriate at this time.  Patient's ex-wife is with him who is driving, and she will take him via private vehicle since his vital signs are stable.  Patient was in agreement with this plan of care.  Patient verbalizes understanding.  All questions were answered. Final Clinical Impressions(s) / UC Diagnoses   Final diagnoses:  None     Discharge Instructions      Go to the emergency department for further evaluation.     ED Prescriptions   None    PDMP not reviewed this encounter.   Tish Men, NP 12/08/21 1906

## 2021-12-08 NOTE — ED Triage Notes (Signed)
Pt has been weak for the last two days and had a fall today due to weakness. Weakness is generalized. Pt with history of afib and when ems arrived pt was in afic 98-110bpm. Pt is on blood thinners but did not hit head or lose consciousness during fall

## 2021-12-08 NOTE — ED Notes (Addendum)
EMS at bedside and reported would take pt to [facility] ED.  Contacted [facility] ED.   Attempted IV x2 prior to EMS arrival. Unable to obtain IV access.

## 2021-12-08 NOTE — ED Notes (Signed)
Patient is being discharged from the Urgent Care and sent to the Emergency Department via EMS . Per NP, patient is in need of higher level of care due to Afib with RVR. Patient is aware and verbalizes understanding of plan of care.  Vitals:   12/08/21 1820  BP: 121/78  Pulse: (!) 101  Resp: 20  Temp: 98.5 F (36.9 C)  SpO2: 94%

## 2021-12-08 NOTE — ED Triage Notes (Addendum)
Pt reports got off work last night and reports was driving home and reports meant to turn left but turned right. Pt reports coworker stated "seemed out of it." Pt able to answer questions appropriately but states "I'm just not thinking right".   Pt reports made it home and then woke up this am and reports shortness of breath with exertion and intermittent unsteady gait. Denies chest pain,dizziness.   CBG 188 in triage.   Pt reports has not been taking metformin as prescribed.  NP aware and at bedside.

## 2021-12-08 NOTE — Discharge Instructions (Signed)
Go to the emergency department for further evaluation

## 2021-12-08 NOTE — ED Notes (Signed)
Report called to [facility] ED RN, Theadora Rama.

## 2021-12-09 ENCOUNTER — Emergency Department (HOSPITAL_COMMUNITY): Payer: Medicare PPO

## 2021-12-09 DIAGNOSIS — E785 Hyperlipidemia, unspecified: Secondary | ICD-10-CM

## 2021-12-09 DIAGNOSIS — E1169 Type 2 diabetes mellitus with other specified complication: Secondary | ICD-10-CM | POA: Diagnosis not present

## 2021-12-09 DIAGNOSIS — N179 Acute kidney failure, unspecified: Principal | ICD-10-CM

## 2021-12-09 DIAGNOSIS — I10 Essential (primary) hypertension: Secondary | ICD-10-CM | POA: Diagnosis not present

## 2021-12-09 DIAGNOSIS — I4891 Unspecified atrial fibrillation: Secondary | ICD-10-CM | POA: Diagnosis present

## 2021-12-09 DIAGNOSIS — E119 Type 2 diabetes mellitus without complications: Secondary | ICD-10-CM | POA: Diagnosis present

## 2021-12-09 DIAGNOSIS — R000 Tachycardia, unspecified: Secondary | ICD-10-CM | POA: Insufficient documentation

## 2021-12-09 DIAGNOSIS — E1121 Type 2 diabetes mellitus with diabetic nephropathy: Secondary | ICD-10-CM | POA: Diagnosis present

## 2021-12-09 LAB — CREATININE, SERUM
Creatinine, Ser: 2.27 mg/dL — ABNORMAL HIGH (ref 0.61–1.24)
GFR, Estimated: 30 mL/min — ABNORMAL LOW (ref >60)

## 2021-12-09 LAB — URINALYSIS, ROUTINE W REFLEX MICROSCOPIC
Bilirubin Urine: NEGATIVE
Glucose, UA: NEGATIVE mg/dL
Hgb urine dipstick: NEGATIVE
Ketones, ur: NEGATIVE mg/dL
Nitrite: NEGATIVE
Protein, ur: NEGATIVE mg/dL
Specific Gravity, Urine: 1.016 (ref 1.005–1.030)
pH: 6 (ref 5.0–8.0)

## 2021-12-09 LAB — RENAL FUNCTION PANEL
Albumin: 3.2 g/dL — ABNORMAL LOW (ref 3.5–5.0)
Anion gap: 12 (ref 5–15)
BUN: 33 mg/dL — ABNORMAL HIGH (ref 8–23)
CO2: 22 mmol/L (ref 22–32)
Calcium: 8.9 mg/dL (ref 8.9–10.3)
Chloride: 100 mmol/L (ref 98–111)
Creatinine, Ser: 2.17 mg/dL — ABNORMAL HIGH (ref 0.61–1.24)
GFR, Estimated: 32 mL/min — ABNORMAL LOW (ref >60)
Glucose, Bld: 120 mg/dL — ABNORMAL HIGH (ref 70–99)
Phosphorus: 2.9 mg/dL (ref 2.5–4.6)
Potassium: 4.1 mmol/L (ref 3.5–5.1)
Sodium: 134 mmol/L — ABNORMAL LOW (ref 135–145)

## 2021-12-09 LAB — RESP PANEL BY RT-PCR (FLU A&B, COVID) ARPGX2
Influenza A by PCR: NEGATIVE
Influenza B by PCR: NEGATIVE
SARS Coronavirus 2 by RT PCR: NEGATIVE

## 2021-12-09 LAB — LIPID PANEL
Cholesterol: 92 mg/dL (ref 0–200)
HDL: 31 mg/dL — ABNORMAL LOW (ref >40)
LDL Cholesterol: 44 mg/dL (ref 0–99)
Total CHOL/HDL Ratio: 3 RATIO
Triglycerides: 85 mg/dL (ref <150)
VLDL: 17 mg/dL (ref 0–40)

## 2021-12-09 LAB — HEMOGLOBIN A1C
Hgb A1c MFr Bld: 7.7 % — ABNORMAL HIGH (ref 4.8–5.6)
Mean Plasma Glucose: 174.29 mg/dL

## 2021-12-09 LAB — CBC
HCT: 41.3 % (ref 39.0–52.0)
Hemoglobin: 13.5 g/dL (ref 13.0–17.0)
MCH: 29.5 pg (ref 26.0–34.0)
MCHC: 32.7 g/dL (ref 30.0–36.0)
MCV: 90.4 fL (ref 80.0–100.0)
Platelets: 233 10*3/uL (ref 150–400)
RBC: 4.57 MIL/uL (ref 4.22–5.81)
RDW: 16.4 % — ABNORMAL HIGH (ref 11.5–15.5)
WBC: 19.2 10*3/uL — ABNORMAL HIGH (ref 4.0–10.5)
nRBC: 0 % (ref 0.0–0.2)

## 2021-12-09 LAB — CBG MONITORING, ED
Glucose-Capillary: 134 mg/dL — ABNORMAL HIGH (ref 70–99)
Glucose-Capillary: 106 mg/dL — ABNORMAL HIGH (ref 70–99)
Glucose-Capillary: 158 mg/dL — ABNORMAL HIGH (ref 70–99)
Glucose-Capillary: 156 mg/dL — ABNORMAL HIGH (ref 70–99)

## 2021-12-09 LAB — GLUCOSE, CAPILLARY: Glucose-Capillary: 131 mg/dL — ABNORMAL HIGH (ref 70–99)

## 2021-12-09 LAB — MAGNESIUM: Magnesium: 1.7 mg/dL (ref 1.7–2.4)

## 2021-12-09 LAB — LACTIC ACID, PLASMA: Lactic Acid, Venous: 1.8 mmol/L (ref 0.5–1.9)

## 2021-12-09 MED ORDER — MELATONIN 5 MG PO TABS
5.0000 mg | ORAL_TABLET | Freq: Every evening | ORAL | Status: DC | PRN
  Administered 2021-12-10 – 2021-12-12 (×2): 5 mg via ORAL
  Filled 2021-12-09 (×2): qty 1

## 2021-12-09 MED ORDER — METOPROLOL TARTRATE 25 MG PO TABS
12.5000 mg | ORAL_TABLET | Freq: Once | ORAL | Status: AC
  Administered 2021-12-09: 12.5 mg via ORAL
  Filled 2021-12-09: qty 1

## 2021-12-09 MED ORDER — DILTIAZEM HCL-DEXTROSE 125-5 MG/125ML-% IV SOLN (PREMIX)
5.0000 mg/h | INTRAVENOUS | Status: DC
  Administered 2021-12-09: 5 mg/h via INTRAVENOUS
  Administered 2021-12-09: 10 mg/h via INTRAVENOUS
  Administered 2021-12-10: 15 mg/h via INTRAVENOUS
  Filled 2021-12-09 (×2): qty 125

## 2021-12-09 MED ORDER — POLYETHYLENE GLYCOL 3350 17 G PO PACK
17.0000 g | PACK | Freq: Every day | ORAL | Status: DC | PRN
  Administered 2021-12-12: 17 g via ORAL
  Filled 2021-12-09: qty 1

## 2021-12-09 MED ORDER — METOPROLOL TARTRATE 5 MG/5ML IV SOLN
2.5000 mg | Freq: Four times a day (QID) | INTRAVENOUS | Status: DC | PRN
  Administered 2021-12-09: 2.5 mg via INTRAVENOUS
  Filled 2021-12-09: qty 5

## 2021-12-09 MED ORDER — ATORVASTATIN CALCIUM 10 MG PO TABS
20.0000 mg | ORAL_TABLET | Freq: Every day | ORAL | Status: DC
  Administered 2021-12-09 – 2021-12-12 (×4): 20 mg via ORAL
  Filled 2021-12-09 (×4): qty 2

## 2021-12-09 MED ORDER — ACETAMINOPHEN 325 MG PO TABS: 650.0000 mg | ORAL_TABLET | Freq: Four times a day (QID) | ORAL | Status: DC | PRN

## 2021-12-09 MED ORDER — METOPROLOL TARTRATE 25 MG PO TABS
12.5000 mg | ORAL_TABLET | Freq: Two times a day (BID) | ORAL | Status: DC
  Administered 2021-12-09: 12.5 mg via ORAL
  Filled 2021-12-09: qty 1

## 2021-12-09 MED ORDER — INSULIN ASPART 100 UNIT/ML IJ SOLN
0.0000 [IU] | Freq: Three times a day (TID) | INTRAMUSCULAR | Status: DC
  Administered 2021-12-09 – 2021-12-10 (×3): 2 [IU] via SUBCUTANEOUS
  Administered 2021-12-10: 1 [IU] via SUBCUTANEOUS
  Administered 2021-12-11 – 2021-12-12 (×3): 2 [IU] via SUBCUTANEOUS
  Administered 2021-12-12: 1 [IU] via SUBCUTANEOUS

## 2021-12-09 MED ORDER — APIXABAN 5 MG PO TABS
5.0000 mg | ORAL_TABLET | Freq: Two times a day (BID) | ORAL | Status: DC
  Administered 2021-12-09 – 2021-12-12 (×7): 5 mg via ORAL
  Filled 2021-12-09 (×8): qty 1

## 2021-12-09 MED ORDER — INSULIN ASPART 100 UNIT/ML IJ SOLN: 0.0000 [IU] | Freq: Every day | INTRAMUSCULAR | Status: DC

## 2021-12-09 MED ORDER — SODIUM CHLORIDE 0.9 % IV SOLN
2.0000 g | INTRAVENOUS | Status: DC
  Administered 2021-12-09: 2 g via INTRAVENOUS
  Filled 2021-12-09: qty 20

## 2021-12-09 MED ORDER — ENOXAPARIN SODIUM 40 MG/0.4ML IJ SOSY: 40.0000 mg | PREFILLED_SYRINGE | INTRAMUSCULAR | Status: DC

## 2021-12-09 MED ORDER — METOPROLOL TARTRATE 5 MG/5ML IV SOLN
5.0000 mg | INTRAVENOUS | Status: AC
  Administered 2021-12-09: 5 mg via INTRAVENOUS
  Filled 2021-12-09: qty 5

## 2021-12-09 MED ORDER — METOPROLOL TARTRATE 25 MG PO TABS
25.0000 mg | ORAL_TABLET | Freq: Two times a day (BID) | ORAL | Status: DC
  Administered 2021-12-09 – 2021-12-10 (×2): 25 mg via ORAL
  Filled 2021-12-09 (×2): qty 1

## 2021-12-09 MED ORDER — PANTOPRAZOLE SODIUM 40 MG PO TBEC
40.0000 mg | DELAYED_RELEASE_TABLET | Freq: Two times a day (BID) | ORAL | Status: DC
  Administered 2021-12-09 – 2021-12-12 (×7): 40 mg via ORAL
  Filled 2021-12-09 (×7): qty 1

## 2021-12-09 MED ORDER — MAGNESIUM SULFATE 2 GM/50ML IV SOLN
2.0000 g | Freq: Once | INTRAVENOUS | Status: AC
  Administered 2021-12-09: 2 g via INTRAVENOUS
  Filled 2021-12-09: qty 50

## 2021-12-09 MED ORDER — METOPROLOL TARTRATE 5 MG/5ML IV SOLN
5.0000 mg | INTRAVENOUS | Status: DC | PRN
  Administered 2021-12-09: 5 mg via INTRAVENOUS
  Filled 2021-12-09: qty 5

## 2021-12-09 MED ORDER — SODIUM CHLORIDE 0.9 % IV SOLN: INTRAVENOUS | Status: DC

## 2021-12-09 NOTE — Hospital Course (Addendum)
Kevin Goodman was admitted to the hospital with the working diagnosis of AKI, complicated with atrial fibrillation with RVR.   71 yo male with the past medical history of atrial fibrillation, hypertension T2DM, dyslipidemia, CKD and obesity who presented with generalized weakness.  Patient was driving home from work and turned into the opposite street, then he lost track of the following events. He has been not compliant with his medications.  His blood pressure was 103/83, HR 134, RR 21 and 02 saturation 96%, lungs with no wheezing or rales, heart with S1 and S2 present and rhythmic, abdomen with no distention and no lower extremity edema.   Na 134, K 4,5 Cl 101 and bicarbonate 25. Glucose 106 bun 33 cr 2,31  Wbc 19,7 hgb 14.5 plt 262   Head CT with areas of loss of cortical gray white differentiation in the posterior left hemisphere and within the left frontal lobe, consistent with acute to subacute infarcts. No hemorrhage.   Chest radiograph with cardiomegaly with no infiltrates.   Brain MRI non acute infarcts of the left frontal and occipital lobes and old left cerebellar infarct.   EKG 113 bpm, normal axis, normal qtc, atrial fibrillation rhythm with no significant ST segment or T wave changes.   Patient was placed on diltiazem infusion for rate control along with oral metoprolol.   10/06 improved heart rate but continue with episodic RVR.  10/07 better rate control atrial fibrillation with metoprolol 50 mg bid and diltiazem 240 mg daily. I have advices Kevin Goodman to be compliant with his medical therapy to prevent hospitalizations and cerebrovascular accidents.

## 2021-12-09 NOTE — ED Notes (Signed)
ED TO INPATIENT HANDOFF REPORT  ED Nurse Name and Phone #: Limmie Schoenberg RN [phone]  S Name/Age/Gender Kevin Goodman 71 y.o. male Room/Bed: 001C/001C  Code Status   Code Status: Full Code  Home/SNF/Other Home Patient oriented to: self, place, time, and situation Is this baseline? Yes   Triage Complete: Triage complete  Chief Complaint AKI (acute kidney injury) ([place]) [N17.9] Tachycardia [R00.0]  Triage Note Pt has been weak for the last two days and had a fall today due to weakness. Weakness is generalized. Pt with history of afib and when ems arrived pt was in afic 98-110bpm. Pt is on blood thinners but did not hit head or lose consciousness during fall   Allergies No Known Allergies  Level of Care/Admitting Diagnosis ED Disposition     ED Disposition  Admit   Condition  --   [place]: [place] [[id number]]  Level of Care: Progressive [102]  Admit to Progressive based on following criteria: CARDIOVASCULAR & THORACIC of moderate stability with acute coronary syndrome symptoms/low risk myocardial infarction/hypertensive urgency/arrhythmias/heart failure potentially compromising stability and stable post cardiovascular intervention patients.  May place patient in observation at [facility] or [facility] if equivalent level of care is available:: No  Covid Evaluation: Asymptomatic - no recent exposure (last 10 days) testing not required  Diagnosis: Tachycardia [242249]  Admitting Physician: Tawni Millers [[id number]]  Attending Physician: Tawni Millers [[id number]]          B Medical/Surgery History Past Medical History:  Diagnosis Date   Atrial fibrillation ([place])    a. initialy occurring in 2013  b. recurrence in 06/2020 in the setting of COVID-19 --> s/p DCCV in 07/2020   Chronic kidney disease, stage 3a ([place])    Diabetes mellitus, type 2 ([place])    GERD (gastroesophageal reflux disease)    Gout    Hypertension    Mild  pulmonary hypertension ([place])    Morbid obesity ([place])    OSA (obstructive sleep apnea)    Paroxysmal atrial flutter ([place])    Sleep apnea    Past Surgical History:  Procedure Laterality Date   BALLOON DILATION N/A 12/25/2020   Procedure: BALLOON DILATION;  Surgeon: Eloise Harman, DO;  [facility];  Service: Endoscopy;  Laterality: N/A;   BIOPSY  12/25/2020   Procedure: BIOPSY;  Surgeon: Eloise Harman, DO;  [facility];  Service: Endoscopy;;   CARDIOVERSION N/A 07/09/2020   Procedure: CARDIOVERSION;  Surgeon: Arnoldo Lenis, MD;  Location: [facility];  Service: Endoscopy;  Laterality: N/A;   COLONOSCOPY N/A 12/08/2016   sigmoid and descending colon diverticula, four semi-pedunculated polyps in descending and ascendign colon, few polyps in descending and ascending colon (tubular adenomas). 3 year surveillance.    COLONOSCOPY WITH PROPOFOL N/A 12/25/2020   Procedure: COLONOSCOPY WITH PROPOFOL;  Surgeon: Eloise Harman, DO;  [facility];  Service: Endoscopy;  Laterality: N/A;  9:30am   ESOPHAGOGASTRODUODENOSCOPY N/A 12/08/2016   normal esophagus s/p empiric dilation, small hiatal hernia.    ESOPHAGOGASTRODUODENOSCOPY (EGD) WITH PROPOFOL N/A 12/25/2020   Procedure: ESOPHAGOGASTRODUODENOSCOPY (EGD) WITH PROPOFOL;  Surgeon: Eloise Harman, DO;  [facility];  Service: Endoscopy;  Laterality: N/A;   FINGER SURGERY     left ring   INCISION AND DRAINAGE OF WOUND Left 04/20/2016   Procedure: LEFT THIGH DEBRIDEMENT;  Surgeon: Mickeal Skinner, MD;  Location: [place];  Service: General;  Laterality: Left;  MALONEY DILATION N/A 12/08/2016   Procedure: Venia Minks DILATION;  Surgeon: Daneil Dolin, MD;  [facility];  Service: Endoscopy;  Laterality: N/A;   POLYPECTOMY  12/08/2016   Procedure: POLYPECTOMY;  Surgeon: Daneil Dolin, MD;  [facility];  Service: Endoscopy;;  colon   POLYPECTOMY  12/25/2020   Procedure:  POLYPECTOMY;  Surgeon: Eloise Harman, DO;  [facility];  Service: Endoscopy;;     A IV Location/Drains/Wounds Patient Lines/Drains/Airways Status     Active Line/Drains/Airways     Name Placement date Placement time Site Days   Peripheral IV 12/08/21 20 G 1.88" Anterior;Right Forearm 12/08/21  2158  Forearm  1            Intake/Output Last 24 hours  Intake/Output Summary (Last 24 hours) at 12/09/2021 1738 Last data filed at 12/09/2021 1617 Gross per 24 hour  Intake --  Output 1050 ml  Net -1050 ml    Labs/Imaging Results for orders placed or performed during the hospital encounter of 12/08/21 (from the past 48 hour(s))  Basic metabolic panel     Status: Abnormal   Collection Time: 12/08/21 10:30 PM  Result Value Ref Range   Sodium 136 135 - 145 mmol/L   Potassium 4.5 3.5 - 5.1 mmol/L   Chloride 101 98 - 111 mmol/L   CO2 24 22 - 32 mmol/L   Glucose, Bld 106 (H) 70 - 99 mg/dL    Comment: Glucose reference range applies only to samples taken after fasting for at least 8 hours.   BUN 33 (H) 8 - 23 mg/dL   Creatinine, Ser 2.31 (H) 0.61 - 1.24 mg/dL   Calcium 9.5 8.9 - 10.3 mg/dL   GFR, Estimated 30 (L) >60 mL/min    Comment: (NOTE) Calculated using the CKD-EPI Creatinine Equation (2021)    Anion gap 11 5 - 15    Comment: Performed at [facility] [address], [place], [place] [address]  CBC     Status: Abnormal   Collection Time: 12/08/21 10:30 PM  Result Value Ref Range   WBC 19.7 (H) 4.0 - 10.5 K/uL   RBC 4.93 4.22 - 5.81 MIL/uL   Hemoglobin 14.5 13.0 - 17.0 g/dL   HCT 44.7 39.0 - 52.0 %   MCV 90.7 80.0 - 100.0 fL   MCH 29.4 26.0 - 34.0 pg   MCHC 32.4 30.0 - 36.0 g/dL   RDW 16.6 (H) 11.5 - 15.5 %   Platelets 262 150 - 400 K/uL   nRBC 0.0 0.0 - 0.2 %    Comment: Performed at [facility] [address], [place], [place] [address]  Lactic acid, plasma     Status: None   Collection Time: 12/08/21 11:40 PM  Result Value Ref Range    Lactic Acid, Venous 1.8 0.5 - 1.9 mmol/L    Comment: Performed at [facility] [address] [place],  [address]  CBC     Status: Abnormal   Collection Time: 12/09/21  1:32 AM  Result Value Ref Range   WBC 19.2 (H) 4.0 - 10.5 K/uL   RBC 4.57 4.22 - 5.81 MIL/uL   Hemoglobin 13.5 13.0 - 17.0 g/dL   HCT 41.3 39.0 - 52.0 %   MCV 90.4 80.0 - 100.0 fL   MCH 29.5 26.0 - 34.0 pg   MCHC 32.7 30.0 - 36.0 g/dL   RDW 16.4 (H) 11.5 - 15.5 %   Platelets  233 150 - 400 K/uL   nRBC 0.0 0.0 - 0.2 %    Comment: Performed at [facility], [place] [address], [place], [place] [address]  Creatinine, serum     Status: Abnormal   Collection Time: 12/09/21  1:32 AM  Result Value Ref Range   Creatinine, Ser 2.27 (H) 0.61 - 1.24 mg/dL   GFR, Estimated 30 (L) >60 mL/min    Comment: (NOTE) Calculated using the CKD-EPI Creatinine Equation (2021) Performed at [facility] [address] [place], [place] [address]   Hemoglobin A1c     Status: Abnormal   Collection Time: 12/09/21  1:32 AM  Result Value Ref Range   Hgb A1c MFr Bld 7.7 (H) 4.8 - 5.6 %    Comment: (NOTE) Pre diabetes:          5.7%-6.4%  Diabetes:              >6.4%  Glycemic control for   <7.0% adults with diabetes    Mean Plasma Glucose 174.29 mg/dL    Comment: Performed at [facility] [address] [place], [place] [address]  Urinalysis, Routine w reflex microscopic Urine, Clean Catch     Status: Abnormal   Collection Time: 12/09/21  3:08 AM  Result Value Ref Range   Color, Urine YELLOW YELLOW   APPearance HAZY (A) CLEAR   Specific Gravity, Urine 1.016 1.005 - 1.030   pH 6.0 5.0 - 8.0   Glucose, UA NEGATIVE NEGATIVE mg/dL   Hgb urine dipstick NEGATIVE NEGATIVE   Bilirubin Urine NEGATIVE NEGATIVE   Ketones, ur NEGATIVE NEGATIVE mg/dL   Protein, ur NEGATIVE NEGATIVE mg/dL   Nitrite NEGATIVE NEGATIVE   Leukocytes,Ua TRACE (A) NEGATIVE   RBC / HPF 0-5 0 - 5 RBC/hpf   WBC, UA 21-50 0 - 5  WBC/hpf   Bacteria, UA MANY (A) NONE SEEN   Squamous Epithelial / LPF 0-5 0 - 5   Hyaline Casts, UA PRESENT     Comment: Performed at [facility], [address] [place],  [address]  Resp Panel by RT-PCR (Flu A&B, Covid) Anterior Nasal Swab     Status: None   Collection Time: 12/09/21  3:11 AM   Specimen: Anterior Nasal Swab  Result Value Ref Range   SARS Coronavirus 2 by RT PCR NEGATIVE NEGATIVE    Comment: (NOTE) SARS-CoV-2 target nucleic acids are NOT DETECTED.  The SARS-CoV-2 RNA is generally detectable in upper respiratory specimens during the acute phase of infection. The lowest concentration of SARS-CoV-2 viral copies this assay can detect is 138 copies/mL. A negative result does not preclude SARS-Cov-2 infection and should not be used as the sole basis for treatment or other patient management decisions. A negative result may occur with  improper specimen collection/handling, submission of specimen other than nasopharyngeal swab, presence of viral mutation(s) within the areas targeted by this assay, and inadequate number of viral copies(<138 copies/mL). A negative result must be combined with clinical observations, patient history, and epidemiological information. The expected result is Negative.  Fact Sheet for Patients:  EntrepreneurPulse.com.au  Fact Sheet for Healthcare Providers:  IncredibleEmployment.be  This test is no t yet approved or cleared by the [place] FDA and  has been authorized for detection and/or diagnosis of SARS-CoV-2 by FDA under an Emergency Use Authorization (EUA). This EUA will remain  in effect (meaning this test can be used) for the duration of the COVID-19 declaration under Section 564(b)(1)  of the Act, 21 U.S.C.section 360bbb-3(b)(1), unless the authorization is terminated  or revoked sooner.       Influenza A by PCR NEGATIVE NEGATIVE   Influenza B by PCR NEGATIVE NEGATIVE     Comment: (NOTE) The Xpert Xpress SARS-CoV-2/FLU/RSV plus assay is intended as an aid in the diagnosis of influenza from Nasopharyngeal swab specimens and should not be used as a sole basis for treatment. Nasal washings and aspirates are unacceptable for Xpert Xpress SARS-CoV-2/FLU/RSV testing.  Fact Sheet for Patients: EntrepreneurPulse.com.au  Fact Sheet for Healthcare Providers: IncredibleEmployment.be  This test is not yet approved or cleared by the [place] FDA and has been authorized for detection and/or diagnosis of SARS-CoV-2 by FDA under an Emergency Use Authorization (EUA). This EUA will remain in effect (meaning this test can be used) for the duration of the COVID-19 declaration under Section 564(b)(1) of the Act, 21 U.S.C. section 360bbb-3(b)(1), unless the authorization is terminated or revoked.  Performed at [facility], [place] [address], [place], [place] [address]   CBG monitoring, ED     Status: Abnormal   Collection Time: 12/09/21  3:23 AM  Result Value Ref Range   Glucose-Capillary 134 (H) 70 - 99 mg/dL    Comment: Glucose reference range applies only to samples taken after fasting for at least 8 hours.  Renal function panel     Status: Abnormal   Collection Time: 12/09/21  5:33 AM  Result Value Ref Range   Sodium 134 (L) 135 - 145 mmol/L   Potassium 4.1 3.5 - 5.1 mmol/L   Chloride 100 98 - 111 mmol/L   CO2 22 22 - 32 mmol/L   Glucose, Bld 120 (H) 70 - 99 mg/dL    Comment: Glucose reference range applies only to samples taken after fasting for at least 8 hours.   BUN 33 (H) 8 - 23 mg/dL   Creatinine, Ser 2.17 (H) 0.61 - 1.24 mg/dL   Calcium 8.9 8.9 - 10.3 mg/dL   Phosphorus 2.9 2.5 - 4.6 mg/dL   Albumin 3.2 (L) 3.5 - 5.0 g/dL   GFR, Estimated 32 (L) >60 mL/min    Comment: (NOTE) Calculated using the CKD-EPI Creatinine Equation (2021)    Anion gap 12 5 - 15    Comment: Performed at [facility] [address], [place], [place] [address]  Lipid panel     Status: Abnormal   Collection Time: 12/09/21  5:33 AM  Result Value Ref Range   Cholesterol 92 0 - 200 mg/dL   Triglycerides 85 <150 mg/dL   HDL 31 (L) >40 mg/dL   Total CHOL/HDL Ratio 3.0 RATIO   VLDL 17 0 - 40 mg/dL   LDL Cholesterol 44 0 - 99 mg/dL    Comment:        Total Cholesterol/HDL:CHD Risk Coronary Heart Disease Risk Table                     Men   Women  1/2 Average Risk   3.4   3.3  Average Risk       5.0   4.4  2 X Average Risk   9.6   7.1  3 X Average Risk  23.4   11.0        Use the calculated Patient Ratio above and the CHD Risk Table to determine the patient's CHD Risk.        ATP III CLASSIFICATION (LDL):  <100  mg/dL   Optimal  100-129  mg/dL   Near or Above                    Optimal  130-159  mg/dL   Borderline  160-189  mg/dL   High  >190     mg/dL   Very High Performed at [facility] [address] [place], [place] [address]   Magnesium     Status: None   Collection Time: 12/09/21  5:33 AM  Result Value Ref Range   Magnesium 1.7 1.7 - 2.4 mg/dL    Comment: Performed at [facility] [address] [place], [place] [address]  CBG monitoring, ED     Status: Abnormal   Collection Time: 12/09/21  7:25 AM  Result Value Ref Range   Glucose-Capillary 106 (H) 70 - 99 mg/dL    Comment: Glucose reference range applies only to samples taken after fasting for at least 8 hours.  CBG monitoring, ED     Status: Abnormal   Collection Time: 12/09/21 12:46 PM  Result Value Ref Range   Glucose-Capillary 158 (H) 70 - 99 mg/dL    Comment: Glucose reference range applies only to samples taken after fasting for at least 8 hours.   MR BRAIN WO CONTRAST  Result Date: 12/09/2021 CLINICAL DATA:  Altered mental status EXAM: MRI HEAD WITHOUT CONTRAST TECHNIQUE: Multiplanar, multiecho pulse sequences of the brain and surrounding structures were obtained without intravenous contrast. COMPARISON:  None  Available. FINDINGS: Brain: No acute infarct, mass effect or extra-axial collection. No acute or chronic hemorrhage. Nonacute infarcts of the left frontal and occipital lobes. 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 There is multifocal periventricular white matter hyperintensity, most often a result of chronic microvascular ischemia. The midline structures are normal. Vascular: Major flow voids are preserved. Skull and upper cervical spine: Normal calvarium and skull base. Visualized upper cervical spine and soft tissues are normal. Sinuses/Orbits:No paranasal sinus fluid levels or advanced mucosal thickening. No mastoid or middle ear effusion. Normal orbits. IMPRESSION: 1. No acute intracranial abnormality. 2. 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 Electronically Signed   By: Ulyses Jarred M.D.   On: 12/09/2021 00:41   DG Chest 2 View  Result Date: 12/09/2021 CLINICAL DATA:  Weakness EXAM: CHEST - 2 VIEW COMPARISON:  06/16/2021 FINDINGS: Lungs are clear.  No pleural effusion or pneumothorax. The heart is normal in size. Mild degenerative changes of the visualized thoracolumbar spine. IMPRESSION: Normal chest radiographs. Electronically Signed   By: Julian Hy M.D.   On: 12/09/2021 00:27   CT Head Wo Contrast  Result Date: 12/08/2021 CLINICAL DATA:  Altered mental status EXAM: CT HEAD WITHOUT CONTRAST TECHNIQUE: Contiguous axial images were obtained from the base of the skull through the vertex without intravenous contrast. RADIATION DOSE REDUCTION: This exam was performed according to the departmental dose-optimization program which includes automated exposure control, adjustment of the mA and/or kV according to patient size and/or use of iterative reconstruction technique. COMPARISON:  None Available. FINDINGS: Brain: Areas of loss of cortical gray-white differentiation in the posterior left hemisphere and within the left frontal lobe. No  acute hemorrhage. No midline shift other mass effect. There is an old left cerebellar infarct Vascular: No abnormal hyperdensity of the major intracranial arteries or dural venous sinuses. No intracranial atherosclerosis. Skull: The visualized skull base, calvarium and extracranial soft tissues are normal. Sinuses/Orbits: No fluid levels  or advanced mucosal thickening of the visualized paranasal sinuses. No mastoid or middle ear effusion. The orbits are normal. IMPRESSION: 1. 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 MRI recommended. 2. No acute hemorrhage. Electronically Signed   By: Ulyses Jarred M.D.   On: 12/08/2021 21:03    Pending Labs Unresulted Labs (From admission, onward)     Start     Ordered   12/10/21 0500  Magnesium  Tomorrow morning,   R        12/09/21 1246   12/10/21 5051  Basic metabolic panel  Tomorrow morning,   R        12/09/21 1246   12/08/21 2322  Urine Culture  Once,   URGENT       Question:  Indication  Answer:  Sepsis   12/08/21 2321            Vitals/Pain Today's Vitals   12/09/21 1540 12/09/21 1600 12/09/21 1630 12/09/21 1700  BP:  (!) 110/95 136/80 (!) 130/92  Pulse: (!) 119 (!) 126 98 (!) 122  Resp:      Temp:      TempSrc:      SpO2: 97% 96% 96% 97%  Weight:      Height:      PainSc:        Isolation Precautions No active isolations  Medications Medications  insulin aspart (novoLOG) injection 0-9 Units (2 Units Subcutaneous Given 12/09/21 1251)  insulin aspart (novoLOG) injection 0-5 Units ( Subcutaneous Not Given 12/09/21 0324)  0.9 %  sodium chloride infusion ( Intravenous Rate/Dose Verify 12/09/21 1325)  apixaban (ELIQUIS) tablet 5 mg (5 mg Oral Given 12/09/21 0900)  atorvastatin (LIPITOR) tablet 20 mg (20 mg Oral Given 12/09/21 0900)  pantoprazole (PROTONIX) EC tablet 40 mg (40 mg Oral Given 12/09/21 0730)  cefTRIAXone  (ROCEPHIN) 2 g in sodium chloride 0.9 % 100 mL IVPB (0 g Intravenous Stopped 12/09/21 0601)  acetaminophen (TYLENOL) tablet 650 mg (has no administration in time range)  polyethylene glycol (MIRALAX / GLYCOLAX) packet 17 g (has no administration in time range)  melatonin tablet 5 mg (has no administration in time range)  metoprolol tartrate (LOPRESSOR) tablet 25 mg (has no administration in time range)  metoprolol tartrate (LOPRESSOR) injection 5 mg (5 mg Intravenous Given 12/09/21 1414)  LORazepam (ATIVAN) injection 0.5 mg (0.5 mg Intravenous Given 12/08/21 2335)  sodium chloride 0.9 % bolus 1,000 mL (0 mLs Intravenous Stopped 12/09/21 0258)  metoprolol tartrate (LOPRESSOR) injection 5 mg (5 mg Intravenous Given 12/09/21 0541)  metoprolol tartrate (LOPRESSOR) tablet 12.5 mg (12.5 mg Oral Given 12/09/21 1027)  magnesium sulfate IVPB 2 g 50 mL (0 g Intravenous Stopped 12/09/21 1458)    Mobility walks Moderate fall risk   Focused Assessments Cardiac Assessment Handoff:  Cardiac Rhythm: Atrial fibrillation Lab Results  Component Value Date   CKTOTAL 224 07/20/2011   CKMB 2.7 07/20/2011   TROPONINI <0.30 07/20/2011   Lab Results  Component Value Date   DDIMER 0.35 06/14/2020   Does the Patient currently have chest pain? No   , Pulmonary Assessment Handoff:  Lung sounds:   O2 Device: Room Air      R Recommendations: See Admitting Provider Note  Report given to:   Additional Notes:

## 2021-12-09 NOTE — Assessment & Plan Note (Addendum)
Hyponatremia   Renal function has improved with supportive medical therapy, at the time of his discharge serum cr is 1,74 with K at 4,6 and serum bicarbonate at 22.  Change furosemide to only as needed, for edema.  Plan to follow up renal function as outpatient.

## 2021-12-09 NOTE — Evaluation (Signed)
Clinical/Bedside Swallow Evaluation Patient Details  Name: Kevin Goodman MRN: [id number] Date of Birth: 27-Feb-1951  Today's Date: 12/09/2021 Time: SLP Start Time (ACUTE ONLY): 4235 SLP Stop Time (ACUTE ONLY): 1004 SLP Time Calculation (min) (ACUTE ONLY): 12 min  Past Medical History:  Past Medical History:  Diagnosis Date   Atrial fibrillation ([place])    a. initialy occurring in 2013  b. recurrence in 06/2020 in the setting of COVID-19 --> s/p DCCV in 07/2020   Chronic kidney disease, stage 3a ([place])    Diabetes mellitus, type 2 ([place])    GERD (gastroesophageal reflux disease)    Gout    Hypertension    Mild pulmonary hypertension ([place])    Morbid obesity ([facility])    OSA (obstructive sleep apnea)    Paroxysmal atrial flutter ([place])    Sleep apnea    Past Surgical History:  Past Surgical History:  Procedure Laterality Date   BALLOON DILATION N/A 12/25/2020   Procedure: BALLOON DILATION;  Surgeon: Eloise Harman, DO;  [facility];  Service: Endoscopy;  Laterality: N/A;   BIOPSY  12/25/2020   Procedure: BIOPSY;  Surgeon: Eloise Harman, DO;  [facility];  Service: Endoscopy;;   CARDIOVERSION N/A 07/09/2020   Procedure: CARDIOVERSION;  Surgeon: Arnoldo Lenis, MD;  Location: [facility];  Service: Endoscopy;  Laterality: N/A;   COLONOSCOPY N/A 12/08/2016   sigmoid and descending colon diverticula, four semi-pedunculated polyps in descending and ascendign colon, few polyps in descending and ascending colon (tubular adenomas). 3 year surveillance.    COLONOSCOPY WITH PROPOFOL N/A 12/25/2020   Procedure: COLONOSCOPY WITH PROPOFOL;  Surgeon: Eloise Harman, DO;  [facility];  Service: Endoscopy;  Laterality: N/A;  9:30am   ESOPHAGOGASTRODUODENOSCOPY N/A 12/08/2016   normal esophagus s/p empiric dilation, small hiatal hernia.    ESOPHAGOGASTRODUODENOSCOPY (EGD) WITH PROPOFOL N/A 12/25/2020   Procedure: ESOPHAGOGASTRODUODENOSCOPY (EGD) WITH PROPOFOL;   Surgeon: Eloise Harman, DO;  [facility];  Service: Endoscopy;  Laterality: N/A;   FINGER SURGERY     left ring   INCISION AND DRAINAGE OF WOUND Left 04/20/2016   Procedure: LEFT THIGH DEBRIDEMENT;  Surgeon: Mickeal Skinner, MD;  Location: [facility];  Service: General;  Laterality: Left;   MALONEY DILATION N/A 12/08/2016   Procedure: Keturah Shavers;  Surgeon: Daneil Dolin, MD;  [facility];  Service: Endoscopy;  Laterality: N/A;   POLYPECTOMY  12/08/2016   Procedure: POLYPECTOMY;  Surgeon: Daneil Dolin, MD;  [facility];  Service: Endoscopy;;  colon   POLYPECTOMY  12/25/2020   Procedure: POLYPECTOMY;  Surgeon: Eloise Harman, DO;  [facility];  Service: Endoscopy;;   HPI:  Pt is a 71 y/o male who presented with confusion in set of AKI. 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 PMH: GERD, chronic a fib, obesity, HTN, HLD, DM2. BSE 08/2020 noted suspected esophageal dysphagia and history of esophageal dilation in plast. Regular/thin liquids and esophageal precautions recommended.    Assessment / Plan / Recommendation  Clinical Impression   Pt seen for swallow assessment, BSE 06/2020 noted no s/sx of aspiration/dysphagia. Pt was alert and cooperative throughout encounter and reports he has swallowing difficulty as he experiences food "sticking" in his chest. Oral motor findings are WNL, however, pt is missing upper dentition. During this evaluation, pt displayed no s/sx of aspiration throughout consistencies. Recurrent belching noted throughout PO trials suggesting possible esophageal dysfunction (pt has  history of esophageal dilation) Pt educated on importance of small bites/sips and upright position while eating. No SLP treatment recommended at this time.  SLP Visit Diagnosis: Dysphagia, unspecified (R13.10)    Aspiration Risk  Mild aspiration risk    Diet Recommendation Regular;Thin liquid   Liquid  Administration via: Straw;Cup Medication Administration: Whole meds with liquid Supervision: Patient able to self feed Compensations: Slow rate;Small sips/bites Postural Changes: Seated upright at 90 degrees;Remain upright for at least 30 minutes after po intake    Other  Recommendations Recommended Consults: Consider esophageal assessment Oral Care Recommendations: Oral care BID    Recommendations for follow up therapy are one component of a multi-disciplinary discharge planning process, led by the attending physician.  Recommendations may be updated based on patient status, additional functional criteria and insurance authorization.  Follow up Recommendations No SLP follow up      Assistance Recommended at Discharge PRN  Functional Status Assessment Patient has not had a recent decline in their functional status  Frequency and Duration            Prognosis        Swallow Study   General Date of Onset: 12/08/21 HPI: Pt is a 71 y/o male who presented with confusion in set of AKI. 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 PMH: GERD, chronic a fib, obesity, HTN, HLD, DM2. BSE 08/2020 noted suspected esophageal dysphagia and history of esophageal dilation in plast. Regular/thin liquids and esophageal precautions recommended. Type of Study: Bedside Swallow Evaluation Previous Swallow Assessment:  (see HPI  Simultaneous filing. User may not have seen previous data.) Diet Prior to this Study: Thin liquids;Regular Temperature Spikes Noted: No Respiratory Status: Room air History of Recent Intubation: No Behavior/Cognition: Alert;Cooperative;Pleasant mood Oral Cavity Assessment: Within Functional Limits Oral Care Completed by SLP: No Oral Cavity - Dentition: Missing dentition Vision: Functional for self-feeding Self-Feeding Abilities: Able to feed self Patient Positioning: Upright in bed Baseline Vocal Quality: Normal Volitional Cough:  Strong;Congested Volitional Swallow: Able to elicit    Oral/Motor/Sensory Function Overall Oral Motor/Sensory Function: Within functional limits   Ice Chips Ice chips: Not tested   Thin Liquid Thin Liquid: Within functional limits Presentation: Self Fed;Straw    Nectar Thick Nectar Thick Liquid: Not tested   Honey Thick Honey Thick Liquid: Not tested   Puree Puree: Within functional limits Presentation: Self Fed;Spoon   Solid     Solid: Within functional limits Presentation: [place] Student SLP  12/09/2021,11:28 AM

## 2021-12-09 NOTE — ED Notes (Signed)
Per admitting provider administer '5mg'$  of metoprolol for patient's HR at this time.

## 2021-12-09 NOTE — ED Notes (Signed)
Secretary paged admitting MD for RN to update on patient's HR=130-140 Afib.

## 2021-12-09 NOTE — ED Provider Notes (Signed)
Assumed care at shift change.  See prior note for full H&P.  Briefly, 71 year old male presenting to the ED with confusion and generalized weakness.  Drove home from work yesterday, got confused and ended up at a [place] parking lot.  He slept in the car for a few hours and drove home.  Presented to urgent care today and sent to the ED for further evaluation.  CT head with findings concerning for acute/subacute stroke, MRI recommended.  Also found to have leukocytosis of 19,000.  AKI with SrCr 2.31 (baseline around 1.3).  Will need admission.  Plan: MRI brain, infectious work-up to include UA, chest x-ray, COVID screen, lactate.  Will admit for ongoing care once completed.  Results for orders placed or performed during the hospital encounter of 31/49/70  Basic metabolic panel  Result Value Ref Range   Sodium 136 135 - 145 mmol/L   Potassium 4.5 3.5 - 5.1 mmol/L   Chloride 101 98 - 111 mmol/L   CO2 24 22 - 32 mmol/L   Glucose, Bld 106 (H) 70 - 99 mg/dL   BUN 33 (H) 8 - 23 mg/dL   Creatinine, Ser 2.31 (H) 0.61 - 1.24 mg/dL   Calcium 9.5 8.9 - 10.3 mg/dL   GFR, Estimated 30 (L) >60 mL/min   Anion gap 11 5 - 15  CBC  Result Value Ref Range   WBC 19.7 (H) 4.0 - 10.5 K/uL   RBC 4.93 4.22 - 5.81 MIL/uL   Hemoglobin 14.5 13.0 - 17.0 g/dL   HCT 44.7 39.0 - 52.0 %   MCV 90.7 80.0 - 100.0 fL   MCH 29.4 26.0 - 34.0 pg   MCHC 32.4 30.0 - 36.0 g/dL   RDW 16.6 (H) 11.5 - 15.5 %   Platelets 262 150 - 400 K/uL   nRBC 0.0 0.0 - 0.2 %  Lactic acid, plasma  Result Value Ref Range   Lactic Acid, Venous 1.8 0.5 - 1.9 mmol/L   MR BRAIN WO CONTRAST  Result Date: 12/09/2021 CLINICAL DATA:  Altered mental status EXAM: MRI HEAD WITHOUT CONTRAST TECHNIQUE: Multiplanar, multiecho pulse sequences of the brain and surrounding structures were obtained without intravenous contrast. COMPARISON:  None Available. FINDINGS: Brain: No acute infarct, mass effect or extra-axial collection. No acute or chronic hemorrhage.  Nonacute infarcts of the left frontal and occipital lobes. 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 There is multifocal periventricular white matter hyperintensity, most often a result of chronic microvascular ischemia. The midline structures are normal. Vascular: Major flow voids are preserved. Skull and upper cervical spine: Normal calvarium and skull base. Visualized upper cervical spine and soft tissues are normal. Sinuses/Orbits:No paranasal sinus fluid levels or advanced mucosal thickening. No mastoid or middle ear effusion. Normal orbits. IMPRESSION: 1. No acute intracranial abnormality. 2. 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 Electronically Signed   By: Ulyses Jarred M.D.   On: 12/09/2021 00:41   DG Chest 2 View  Result Date: 12/09/2021 CLINICAL DATA:  Weakness EXAM: CHEST - 2 VIEW COMPARISON:  06/16/2021 FINDINGS: Lungs are clear.  No pleural effusion or pneumothorax. The heart is normal in size. Mild degenerative changes of the visualized thoracolumbar spine. IMPRESSION: Normal chest radiographs. Electronically Signed   By: Julian Hy M.D.   On: 12/09/2021 00:27   CT Head Wo Contrast  Result Date: 12/08/2021 CLINICAL DATA:  Altered mental status EXAM: CT HEAD WITHOUT CONTRAST TECHNIQUE: Contiguous axial images were obtained from the  base of the skull through the vertex without intravenous contrast. RADIATION DOSE REDUCTION: This exam was performed according to the departmental dose-optimization program which includes automated exposure control, adjustment of the mA and/or kV according to patient size and/or use of iterative reconstruction technique. COMPARISON:  None Available. FINDINGS: Brain: Areas of loss of cortical gray-white differentiation in the posterior left hemisphere and within the left frontal lobe. No acute hemorrhage. No midline shift other mass effect. There is an old left cerebellar infarct Vascular: No abnormal  hyperdensity of the major intracranial arteries or dural venous sinuses. No intracranial atherosclerosis. Skull: The visualized skull base, calvarium and extracranial soft tissues are normal. Sinuses/Orbits: No fluid levels or advanced mucosal thickening of the visualized paranasal sinuses. No mastoid or middle ear effusion. The orbits are normal. IMPRESSION: 1. 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 MRI recommended. 2. No acute hemorrhage. Electronically Signed   By: Ulyses Jarred M.D.   On: 12/08/2021 21:03    MRI with non-acute infarcts in left front and occipital lobes, old left cerebellar infarct.  UA and covid screen pending.  Will admit for ongoing care.  Discussed with Dr. Nevada Crane-- will admit for ongoing care.   Larene Pickett, PA-C 12/09/21 [id number]    Merryl Hacker, MD 12/09/21 [phone]

## 2021-12-09 NOTE — Assessment & Plan Note (Addendum)
Blood pressure systolic has been 854 to 883 mmHg.   Patient tolerating well diltiazem and metoprolol At the time of discharge resume hydralazine and losartan.  Continue blood pressure monitoring as outpatient. Advised to be compliant with is medical therapy as outpatient.

## 2021-12-09 NOTE — H&P (Addendum)
History and Physical  Kevin Goodman [name]:[id number] DOB: 07-23-1950 DOA: 12/08/2021  Referring physician: Lattie Haw PA-EDP  PCP: Kevin Meiers, Goodman  Outpatient Specialists: Cardiology Patient coming from: Home  Chief Complaint: Generalized weakness   HPI: Kevin Goodman is a 71 y.o. male with medical history significant for chronic atrial fibrillation on Eliquis with noncompliance, severe morbid obesity, hypertension, hyperlipidemia, type 2 diabetes, presented to [facility] ED from home due to generalized weakness and 1 episode of confusion a day prior to presentation.  Work-up in the ED reveals AKI and remote CVA seen on MRI brain.  EDP requested admission for further management.  The patient was admitted by [facility], hospitalist service.  ED Course: Tmax 99.6.  BP 113/84, pulse 99, respiratory rate 29, O2 saturation 93% on room air.  Lab studies remarkable for creatinine 2.27, WBC 19.2, hemoglobin A1c 7.7.  UA positive for pyuria.  Review of Systems: Review of systems as noted in the HPI. All other systems reviewed and are negative.   Past Medical History:  Diagnosis Date   Atrial fibrillation (Kevin Goodman)    a. initialy occurring in 2013  b. recurrence in 06/2020 in the setting of COVID-19 --> s/p DCCV in 07/2020   Chronic kidney disease, stage 3a (Kevin Goodman)    Diabetes mellitus, type 2 (Kevin Goodman)    GERD (gastroesophageal reflux disease)    Gout    Hypertension    Mild pulmonary hypertension (Kevin Goodman)    Morbid obesity (Kevin Goodman)    OSA (obstructive sleep apnea)    Paroxysmal atrial flutter (Kevin Goodman)    Sleep apnea    Past Surgical History:  Procedure Laterality Date   BALLOON DILATION N/A 12/25/2020   Procedure: BALLOON DILATION;  Surgeon: Eloise Harman, Goodman;  [facility];  Service: Endoscopy;  Laterality: N/A;   BIOPSY  12/25/2020   Procedure: BIOPSY;  Surgeon: Eloise Harman, Goodman;  [facility];  Service: Endoscopy;;   CARDIOVERSION N/A 07/09/2020   Procedure: CARDIOVERSION;   Surgeon: Arnoldo Lenis, Goodman;  Location: [facility];  Service: Endoscopy;  Laterality: N/A;   COLONOSCOPY N/A 12/08/2016   sigmoid and descending colon diverticula, four semi-pedunculated polyps in descending and ascendign colon, few polyps in descending and ascending colon (tubular adenomas). 3 year surveillance.    COLONOSCOPY WITH PROPOFOL N/A 12/25/2020   Procedure: COLONOSCOPY WITH PROPOFOL;  Surgeon: Eloise Harman, Goodman;  [facility];  Service: Endoscopy;  Laterality: N/A;  9:30am   ESOPHAGOGASTRODUODENOSCOPY N/A 12/08/2016   normal esophagus s/p empiric dilation, small hiatal hernia.    ESOPHAGOGASTRODUODENOSCOPY (EGD) WITH PROPOFOL N/A 12/25/2020   Procedure: ESOPHAGOGASTRODUODENOSCOPY (EGD) WITH PROPOFOL;  Surgeon: Eloise Harman, Goodman;  [facility];  Service: Endoscopy;  Laterality: N/A;   FINGER SURGERY     left ring   INCISION AND DRAINAGE OF WOUND Left 04/20/2016   Procedure: LEFT THIGH DEBRIDEMENT;  Surgeon: Mickeal Skinner, Goodman;  Location: [facility];  Service: General;  Laterality: Left;   MALONEY DILATION N/A 12/08/2016   Procedure: Keturah Shavers;  Surgeon: Kevin Dolin, Goodman;  [facility];  Service: Endoscopy;  Laterality: N/A;   POLYPECTOMY  12/08/2016   Procedure: POLYPECTOMY;  Surgeon: Kevin Dolin, Goodman;  [facility];  Service: Endoscopy;;  colon   POLYPECTOMY  12/25/2020   Procedure: POLYPECTOMY;  Surgeon: Eloise Harman, Goodman;  [facility];  Service: Endoscopy;;    Social History:  reports that he has been smoking  cigars. He has never used smokeless tobacco. He reports that he does not currently use alcohol. He reports that he does not use drugs.   No Known Allergies  Family History  Problem Relation Age of Onset   Heart failure Mother    Hypertension Mother    Hypertension Father    Stroke Father    Prostate cancer Father    Stroke Brother    Prostate cancer Brother    Colon cancer Neg Hx     Colon polyps Neg Hx       Prior to Admission medications   Medication Sig Start Date End Date Taking? Authorizing Provider  apixaban (ELIQUIS) 5 MG TABS tablet Take 1 tablet (5 mg total) by mouth 2 (two) times daily. 06/19/21  Yes Johnson, Clanford L, Goodman  atorvastatin (LIPITOR) 20 MG tablet Take 20 mg by mouth daily.   Yes Provider, Historical, Goodman  diltiazem (CARDIZEM CD) 360 MG 24 hr capsule Take 1 capsule (360 mg total) by mouth daily. 06/19/21  Yes Johnson, Clanford L, Goodman  furosemide (LASIX) 20 MG tablet Take 1 tablet (20 mg total) by mouth daily. 07/03/20 12/10/22 Yes Strader, Fransisco Hertz, PA-C  hydrALAZINE (APRESOLINE) 100 MG tablet Take 100 mg by mouth 2 (two) times daily.   Yes Provider, Historical, Goodman  losartan (COZAAR) 100 MG tablet Take 1 tablet (100 mg total) by mouth daily. 10/27/20 12/09/21 Yes Strader, Fransisco Hertz, PA-C  metFORMIN (GLUCOPHAGE) 500 MG tablet Take 1,000 mg by mouth 2 (two) times daily with a meal.   Yes Provider, Historical, Goodman  metoprolol tartrate (LOPRESSOR) 50 MG tablet Take 1 tablet (50 mg total) by mouth 2 (two) times daily. 06/19/21  Yes Johnson, Clanford L, Goodman  naproxen (NAPROSYN) 500 MG tablet Take 500 mg by mouth 2 (two) times daily as needed for moderate pain.   Yes Provider, Historical, Goodman  oxymetazoline (AFRIN) 0.05 % nasal spray Place 1 spray into both nostrils 2 (two) times daily as needed for congestion.   Yes Provider, Historical, Goodman  pantoprazole (PROTONIX) 40 MG tablet Take 1 tablet (40 mg total) by mouth 2 (two) times daily before a meal. 30 minutes before breakfast 12/25/20 12/10/22 Yes Carver, Kevin Goodman  TRESIBA FLEXTOUCH 100 UNIT/ML FlexTouch Pen Inject 40 Units into the skin daily. 05/06/20  Yes Provider, Historical, Goodman    Physical Exam: BP 103/82 (BP Location: Right Arm)   Pulse (!) 134   Temp 98.8 F (37.1 C)   Resp (!) 21   Ht '6\' 1"'$  (1.854 m)   Wt (!) 172.4 kg   SpO2 96%   BMI 50.13 kg/m   General: 71 y.o. year-old male well developed  well nourished in no acute distress.  Alert and oriented x3. Cardiovascular: Irregular rate and rhythm with no rubs or gallops.  No thyromegaly or JVD noted.  Trace lower extremity edema bilaterally. Respiratory: Clear to auscultation with no wheezes or rales. Good inspiratory effort. Abdomen: Soft nontender nondistended with normal bowel sounds x4 quadrants. Muskuloskeletal: No cyanosis or clubbing.  Trace edema noted bilaterally Neuro: CN II-XII intact, strength, sensation, reflexes Skin: No ulcerative lesions noted or rashes Psychiatry: Judgement and insight appear normal. Mood is appropriate for condition and setting          Labs on Admission:  Basic Metabolic Panel: Recent Labs  Lab 12/08/21 2230 12/09/21 0132  NA 136  --   K 4.5  --   CL 101  --   CO2 24  --  GLUCOSE 106*  --   BUN 33*  --   CREATININE 2.31* 2.27*  CALCIUM 9.5  --    Liver Function Tests: No results for input(s): "AST", "ALT", "ALKPHOS", "BILITOT", "PROT", "ALBUMIN" in the last 168 hours. No results for input(s): "LIPASE", "AMYLASE" in the last 168 hours. No results for input(s): "AMMONIA" in the last 168 hours. CBC: Recent Labs  Lab 12/08/21 2230 12/09/21 0132  WBC 19.7* 19.2*  HGB 14.5 13.5  HCT 44.7 41.3  MCV 90.7 90.4  PLT 262 233   Cardiac Enzymes: No results for input(s): "CKTOTAL", "CKMB", "CKMBINDEX", "TROPONINI" in the last 168 hours.  BNP (last 3 results) No results for input(s): "BNP" in the last 8760 hours.  ProBNP (last 3 results) No results for input(s): "PROBNP" in the last 8760 hours.  CBG: Recent Labs  Lab 12/09/21 0323  GLUCAP 134*    Radiological Exams on Admission: MR BRAIN WO CONTRAST  Result Date: 12/09/2021 CLINICAL DATA:  Altered mental status EXAM: MRI HEAD WITHOUT CONTRAST TECHNIQUE: Multiplanar, multiecho pulse sequences of the brain and surrounding structures were obtained without intravenous contrast. COMPARISON:  None Available. FINDINGS: Brain: No  acute infarct, mass effect or extra-axial collection. No acute or chronic hemorrhage. Nonacute infarcts of the left frontal and occipital lobes. 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 There is multifocal periventricular white matter hyperintensity, most often a result of chronic microvascular ischemia. The midline structures are normal. Vascular: Major flow voids are preserved. Skull and upper cervical spine: Normal calvarium and skull base. Visualized upper cervical spine and soft tissues are normal. Sinuses/Orbits:No paranasal sinus fluid levels or advanced mucosal thickening. No mastoid or middle ear effusion. Normal orbits. IMPRESSION: 1. No acute intracranial abnormality. 2. 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 Electronically Signed   By: Ulyses Jarred M.D.   On: 12/09/2021 00:41   DG Chest 2 View  Result Date: 12/09/2021 CLINICAL DATA:  Weakness EXAM: CHEST - 2 VIEW COMPARISON:  06/16/2021 FINDINGS: Lungs are clear.  No pleural effusion or pneumothorax. The heart is normal in size. Mild degenerative changes of the visualized thoracolumbar spine. IMPRESSION: Normal chest radiographs. Electronically Signed   By: Julian Hy M.D.   On: 12/09/2021 00:27   CT Head Wo Contrast  Result Date: 12/08/2021 CLINICAL DATA:  Altered mental status EXAM: CT HEAD WITHOUT CONTRAST TECHNIQUE: Contiguous axial images were obtained from the base of the skull through the vertex without intravenous contrast. RADIATION DOSE REDUCTION: This exam was performed according to the departmental dose-optimization program which includes automated exposure control, adjustment of the mA and/or kV according to patient size and/or use of iterative reconstruction technique. COMPARISON:  None Available. FINDINGS: Brain: Areas of loss of cortical gray-white differentiation in the posterior left hemisphere and within the left frontal lobe. No acute hemorrhage. No midline shift  other mass effect. There is an old left cerebellar infarct Vascular: No abnormal hyperdensity of the major intracranial arteries or dural venous sinuses. No intracranial atherosclerosis. Skull: The visualized skull base, calvarium and extracranial soft tissues are normal. Sinuses/Orbits: No fluid levels or advanced mucosal thickening of the visualized paranasal sinuses. No mastoid or middle ear effusion. The orbits are normal. IMPRESSION: 1. 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 MRI recommended. 2. No acute hemorrhage. Electronically Signed   By: Ulyses Jarred M.D.   On: 12/08/2021 21:03  EKG: I independently viewed the EKG done and my findings are as followed: Atrial fibrillation rate of 112.  Nonspecific ST-T changes.  QTc 466.  Assessment/Plan Present on Admission:  AKI (acute kidney injury) ([place])  Principal Problem:   AKI (acute kidney injury) ([place])  AKI on CKD 2, suspect prerenal from dehydration Baseline creatinine appears to be 1.3 with GFR of greater than 60 Presented with creatinine of 2.31 with GFR of 30. Avoid nephrotoxic agents, dehydration and hypotension Gentle IV fluid hydration NS at 50 cc/h x 1 day. Monitor urine output with strict I's and O's.  Chronic atrial fibrillation on Eliquis Endorses noncompliance with Eliquis, misses doses at times Counseled on the importance of medication compliance. Resume home metoprolol Monitor on telemetry  CVA, remote, seen on MRI brain. Patient unaware of this history. Fasting lipid panel, 2D echo PT/OT/speech evaluation  Medication noncompliance Counseled on the importance of medication compliance at bedside.  Severe morbid obesity BMI 50 Recommend weight loss outpatient with regular physical activity and healthy dieting.  Leukocytosis, suspect from presumptive UTI Presented with WBC greater than  19,000 UA positive for pyuria Obtain urine culture, monitor fever curve and WBC. Start Rocephin empirically.  Type 2 diabetes Hemoglobin A1c 7.7 on 12/09/2021. Insulin sliding scale.  Generalized weakness, likely multifactorial PT OT assessment Gentle IV fluid hydration Treat underlying conditions, presumptive UTI Fall precautions     DVT prophylaxis: Eliquis  Code Status: Full code  Family Communication: None at bedside  Disposition Plan: Admitted to telemetry medical unit  Consults called: None.  Admission status: Observation status   Status is: Observation    Kayleen Memos Goodman [facility] Pager [phone]  If 7PM-7AM, please contact night-coverage [email or web address] Password [email or web address]  12/09/2021, 4:22 AM

## 2021-12-09 NOTE — Assessment & Plan Note (Addendum)
Patient developed rapid ventricular response atrial fibrillation and required IV diltiazem infusion, along with oral metoprolol.  Rate control was achieved, with heart rate 80 to 90, with diltiazem 240 mg and metoprolol 50 mg bid.   Noted that patient had 360 mg diltiazem prescribed, but he has been inconsistent taking his medications. Currently patient is rate controlled with lower dose of diltiazem, to prevent side effects, will continue with this current dose and will recommend close follow up.   Chads VAsc score is 4, continue anticoagulation with apixaban.   His echocardiogram from 06/2021 had preserved LV systolic function with EF 50 to 55% with moderate concentric LVH, RV systolic function preserved, LA moderate dilatation, and no significant valvular disease.

## 2021-12-09 NOTE — Progress Notes (Signed)
Patient did not tolerate CPAP.  RT worked with patient for >57mn.  RT attempted nasal mask due to patient being claustrophobic. CPAP left at bedside in hopes that patient will try again tomorrow.  RT will continue to monitor.

## 2021-12-09 NOTE — Evaluation (Signed)
Physical Therapy Evaluation Patient Details Name: Kevin Goodman MRN: [id number] DOB: June 28, 1950 Today's Date: 12/09/2021  History of Present Illness  Pt is a 71 y/o male who presented with confusion in set of AKI. 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 PMH: chronic a fib, obesity, HTN, HLD, DM2  Clinical Impression  Pt was seen after sustaining a fall on the steps at work, and then had confusion with AKI.  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  Pt will have intermittent family help as his stepson is working as well, but will be able to have home therapy to work on recovery of strength and control of LLE to get back to work.  Pt has a warm and red LLE, with pre-existing edema and scratched the skin when he fell at work.  Will recommend him to have HHPT and to look at [facility] for mobility next session as well as to practice stairs for home.  Home discharge with provision that he can manage STE the house.       Recommendations for follow up therapy are one component of a multi-disciplinary discharge planning process, led by the attending physician.  Recommendations may be updated based on patient status, additional functional criteria and insurance authorization.  Follow Up Recommendations Home health PT      Assistance Recommended at Discharge Intermittent Supervision/Assistance  Patient can return home with the following  A little help with walking and/or transfers;A little help with bathing/dressing/bathroom;Help with stairs or ramp for entrance;Assistance with cooking/housework    Equipment Recommendations None recommended by PT  Recommendations for Other Services       Functional Status Assessment       Precautions / Restrictions Precautions Precautions: Fall;Other (comment) Precaution Comments: monitor HR Restrictions Weight Bearing Restrictions: No      Mobility  Bed Mobility Overal bed  mobility: Needs Assistance Bed Mobility: Supine to Sit, Sit to Supine     Supine to sit: Min assist Sit to supine: Min assist   General bed mobility comments: support to trunk off bed and to assist LLE back to bed    Transfers Overall transfer level: Needs assistance Equipment used: None Transfers: Sit to/from Stand Sit to Stand: Min guard           General transfer comment: pt is guarded to wb on LLE    Ambulation/Gait Ambulation/Gait assistance: Min guard, Min assist Gait Distance (Feet): 25 Feet Assistive device: 1 person hand held assist Gait Pattern/deviations: Wide base of support, Trunk flexed Gait velocity: reduced Gait velocity interpretation: <1.31 ft/sec, indicative of household ambulator Pre-gait activities: standing balance ck General Gait Details: minor stumbling on LLE, with weakness all mm groups upon testing  Stairs            Wheelchair Mobility    Modified Rankin (Stroke Patients Only)       Balance Overall balance assessment: Needs assistance Sitting-balance support: Feet supported Sitting balance-Leahy Scale: Good     Standing balance support: Single extremity supported Standing balance-Leahy Scale: Fair                               Pertinent Vitals/Pain Pain Assessment Pain Assessment: Faces Faces Pain Scale: Hurts little more Pain Location: LLE with pressure on lower leg Pain Descriptors / Indicators: Guarding, Grimacing Pain Intervention(s): Monitored during session, Repositioned  Home Living Family/patient expects to be discharged to:: Private residence Living Arrangements: Other relatives Available Help at Discharge: Family;Available PRN/intermittently Type of Home: House Home Access: Stairs to enter Entrance Stairs-Rails: Right (cannot functionally use) Entrance Stairs-Number of Steps: 8-9   Home Layout: Two level;Able to live on main level with bedroom/bathroom Home Equipment: None      Prior  Function Prior Level of Function : Independent/Modified Independent;Driving;Working/employed             Mobility Comments: no use of AD typically ADLs Comments: security guard second shift     Hand Dominance   Dominant Hand: Right    Extremity/Trunk Assessment   Upper Extremity Assessment Upper Extremity Assessment: Defer to OT evaluation    Lower Extremity Assessment Lower Extremity Assessment: LLE deficits/detail LLE Deficits / Details: 4/5 strength with pain LLE Coordination: decreased gross motor    Cervical / Trunk Assessment Cervical / Trunk Exceptions: body habitus  Communication   Communication: No difficulties  Cognition Arousal/Alertness: Awake/alert Behavior During Therapy: WFL for tasks assessed/performed Overall Cognitive Status: Impaired/Different from baseline Area of Impairment: Safety/judgement, Awareness, Problem solving                         Safety/Judgement: Decreased awareness of safety, Decreased awareness of deficits Awareness: Intellectual Problem Solving: Slow processing, Requires verbal cues, Requires tactile cues General Comments: pt is up to walk and encouraging PT not to assist him as he is stumbling on LLE        General Comments General comments (skin integrity, edema, etc.): Pt had fallen prior to admission and note his LLE is red and warm, painful and weak with mm testing    Exercises     Assessment/Plan    PT Assessment    PT Problem List         PT Treatment Interventions      PT Goals (Current goals can be found in the Care Plan section)  Acute Rehab PT Goals Patient Stated Goal: to get home and feel safe PT Goal Formulation: With patient Time For Goal Achievement: 12/16/21 Potential to Achieve Goals: Good    Frequency Min 3X/week     Co-evaluation               AM-PAC PT "6 Clicks" Mobility  Outcome Measure Help needed turning from your back to your side while in a flat bed without using  bedrails?: A Little Help needed moving from lying on your back to sitting on the side of a flat bed without using bedrails?: A Little Help needed moving to and from a bed to a chair (including a wheelchair)?: A Little Help needed standing up from a chair using your arms (e.g., wheelchair or bedside chair)?: A Little Help needed to walk in hospital room?: A Little Help needed climbing 3-5 steps with a railing? : A Little 6 Click Score: 18    End of Session   Activity Tolerance: Patient limited by pain Patient left: in bed;with call bell/phone within reach Nurse Communication: Mobility status;Other (comment) (contact team about redness and warmth on LLE) PT Visit Diagnosis: Unsteadiness on feet (R26.81);Muscle weakness (generalized) (M62.81);Difficulty in walking, not elsewhere classified (R26.2);Pain Pain - Right/Left: Left Pain - part of body: Leg    Time: 2952-8413 PT Time Calculation (min) (ACUTE ONLY): 23 min   Charges:   PT Evaluation $PT Eval Moderate Complexity: 1 Mod PT Treatments $Therapeutic Activity: 8-22 mins  Ramond Dial 12/09/2021, 1:13 PM  Mee Hives, PT PhD Acute Rehab Dept. Number: [place]

## 2021-12-09 NOTE — Evaluation (Signed)
Occupational Therapy Evaluation Patient Details Name: Kevin Goodman MRN: [id number] DOB: 03-09-1950 Today's Date: 12/09/2021   History of Present Illness Pt is a 71 y/o male who presented with confusion in set of AKI. 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 PMH: chronic a fib, obesity, HTN, HLD, DM2   Clinical Impression   PTA, pt lives with step son, typically Independent in all ADLs, IADLs, and mobility without AD. Pt works in [employer]. Pt presents now with deficits in LLE pain (new since reported fall down stairs yesterday) and cardiopulmonary tolerance. Pt with improving cognition since admission though some deficits in higher level functions still noted. Pt requires Setup for UB ADL, Min A for LB ADLs and min guard for BSC transfers w/o AD. Deferred hallway mobility due to rising HR with ADLs, up to 164bpm during session. Pending HR control and adequate progression of mobility/cognition, anticipate no OT needs at DC. Will continue to follow acutely.      Recommendations for follow up therapy are one component of a multi-disciplinary discharge planning process, led by the attending physician.  Recommendations may be updated based on patient status, additional functional criteria and insurance authorization.   Follow Up Recommendations  No OT follow up (pending progress)    Assistance Recommended at Discharge Intermittent Supervision/Assistance  Patient can return home with the following Assistance with cooking/housework;Help with stairs or ramp for entrance    Functional Status Assessment  Patient has had a recent decline in their functional status and demonstrates the ability to make significant improvements in function in a reasonable and predictable amount of time.  Equipment Recommendations  Other (comment) (TBD pending progression of mobility)    Recommendations for Other Services       Precautions / Restrictions Precautions Precautions:  Fall;Other (comment) Precaution Comments: monitor HR Restrictions Weight Bearing Restrictions: No      Mobility Bed Mobility Overal bed mobility: Needs Assistance Bed Mobility: Supine to Sit     Supine to sit: Min assist     General bed mobility comments: assist to lift trunk off of stretcher    Transfers Overall transfer level: Needs assistance Equipment used: None Transfers: Sit to/from Stand, Bed to chair/wheelchair/BSC Sit to Stand: Min guard     Step pivot transfers: Min guard     General transfer comment: limping gait to step to [facility]      Balance Overall balance assessment: Needs assistance Sitting-balance support: No upper extremity supported, Feet supported Sitting balance-Leahy Scale: Good     Standing balance support: No upper extremity supported, During functional activity Standing balance-Leahy Scale: Fair                             ADL either performed or assessed with clinical judgement   ADL Overall ADL's : Needs assistance/impaired Eating/Feeding: Independent   Grooming: Supervision/safety;Standing   Upper Body Bathing: Set up;Sitting   Lower Body Bathing: Min guard;Sit to/from stand   Upper Body Dressing : Set up;Sitting   Lower Body Dressing: Minimal assistance;Sit to/from stand Lower Body Dressing Details (indicate cue type and reason): assist for socks for safety d/t high stretcher height Toilet Transfer: Min Patent examiner Details (indicate cue type and reason): noted to limp when transferred to [facility] -  this is when pt mentioned a fall down the stairs at home Toileting- Clothing Manipulation and Hygiene: Minimal assistance;Sitting/lateral lean;Sit to/from stand Toileting - Clothing Manipulation Details (indicate cue type and  reason): assist to get cleaned on stretcher after small bowel incontinence. transferred to [facility] and setup with washcloths afterwards       General ADL Comments: Limited by L  LE discomfort in standing and increasing HR with minimal activity. Cognition improving but not back to baseline     Vision Ability to See in Adequate Light: 0 Adequate Patient Visual Report: No change from baseline Vision Assessment?: No apparent visual deficits     Perception     Praxis      Pertinent Vitals/Pain Pain Assessment Pain Assessment: Faces Faces Pain Scale: Hurts little more Pain Location: L LE with WB Pain Descriptors / Indicators: Grimacing, Guarding, Sore Pain Intervention(s): Monitored during session     Hand Dominance Right   Extremity/Trunk Assessment Upper Extremity Assessment Upper Extremity Assessment: Overall WFL for tasks assessed   Lower Extremity Assessment Lower Extremity Assessment: Defer to PT evaluation   Cervical / Trunk Assessment Cervical / Trunk Assessment: Other exceptions Cervical / Trunk Exceptions: increased habitus   Communication Communication Communication: No difficulties   Cognition Arousal/Alertness: Awake/alert Behavior During Therapy: WFL for tasks assessed/performed Overall Cognitive Status: Impaired/Different from baseline Area of Impairment: Safety/judgement, Awareness, Problem solving                         Safety/Judgement: Decreased awareness of deficits Awareness: Emergent Problem Solving: Difficulty sequencing, Requires verbal cues General Comments: Pt very pleasant, able to answer orientation questions and give timeline of hospital presentation. Questionable awareness as pt had small BM on stretcher but did not alert staff quickly regarding this. some cues for line awareness and problem solving     General Comments  Pt reports a fall down the steps on the way to work yesterday morning, was able to get himself up and go to work but reports LLE sore and noted to be limping today. L LE also with some reddenning and swelling - pt reports the swelling may be from a previous surgery - MD/RW aware     Exercises     Shoulder Instructions      Home Living Family/patient expects to be discharged to:: Private residence Living Arrangements: Other relatives (stepson) Available Help at Discharge: Family;Available PRN/intermittently Type of Home: House Home Access: Stairs to enter [employer] of Steps: 8-9 Entrance Stairs-Rails: Right (reports rail is worn out and needs to be fixed) Home Layout: Two level;Able to live on main level with bedroom/bathroom               Home Equipment: None          Prior Functioning/Environment Prior Level of Function : Independent/Modified Independent;Driving;Working/employed             Mobility Comments: no use of AD typically ADLs Comments: Works in [place], typically independent with all daily tasks        OT Problem List: Decreased activity tolerance;Impaired balance (sitting and/or standing);Decreased cognition;Decreased knowledge of use of DME or AE;Obesity;Cardiopulmonary status limiting activity      OT Treatment/Interventions: Self-care/ADL training;Therapeutic exercise;Energy conservation;DME and/or AE instruction;Therapeutic activities;Patient/family education    OT Goals(Current goals can be found in the care plan section) Acute Rehab OT Goals Patient Stated Goal: feel better, get home and back to work soon OT Goal Formulation: With patient Time For Goal Achievement: 12/23/21 Potential to Achieve Goals: Good ADL Goals Pt Will Perform Lower Body Dressing: with modified independence;sit to/from stand Pt Will Transfer to Toilet: with modified independence;ambulating;Independently Additional ADL Goal #  1: Pt to increase standing activity tolerance > 10 min during functional tasks with stable vital signs.  OT Frequency: Min 2X/week    Co-evaluation              AM-PAC OT "6 Clicks" Daily Activity     Outcome Measure Help from another person eating meals?: None Help from another person taking care of  personal grooming?: A Little Help from another person toileting, which includes using toliet, bedpan, or urinal?: A Little Help from another person bathing (including washing, rinsing, drying)?: A Little Help from another person to put on and taking off regular upper body clothing?: A Little Help from another person to put on and taking off regular lower body clothing?: A Little 6 Click Score: 19   End of Session Nurse Communication: Mobility status (HR)  Activity Tolerance: Patient tolerated treatment well Patient left: with call bell/phone within reach (on [facility] (RN/NT aware))  OT Visit Diagnosis: Unsteadiness on feet (R26.81);Other abnormalities of gait and mobility (R26.89)                Time: 0071-2197 OT Time Calculation (min): 23 min Charges:  OT General Charges $OT Visit: 1 Visit OT Evaluation $OT Eval Low Complexity: 1 Low  Malachy Chamber, OTR/L Acute Rehab Services Office: [phone]   Layla Maw 12/09/2021, 10:00 AM

## 2021-12-09 NOTE — Assessment & Plan Note (Signed)
Calculated BMI is 50,1

## 2021-12-09 NOTE — Progress Notes (Signed)
  Progress Note   Patient: Kevin Goodman ZOX:[id number] DOB: [birth year] DOA: 12/08/2021     0 DOS: the patient was seen and examined on 12/09/2021   Brief hospital course: Kevin Goodman was admitted to the hospital with the working diagnosis of AKI  71 yo male with the past medical history of atrial fibrillation, hypertension T2DM, dyslipidemia and obesity who presented with generalized weakness. His blood pressure was 103/83, HR 134, RR 21 and 02 saturation 96%, lungs with no wheezing or rales, heart with S1 and S2 present and rhythmic, abdomen with no distention and no lower extremity edema.   Na 134, K 4,5 Cl 101 and bicarbonate 25. Glucose 106 bun 33 cr 2,31  Wbc 19,7 hgb 14.5 plt 262   Head CT with areas of loss of cortical gray white differentiation in the posterior left hemisphere and within the left frontal lobe, consistent with acute to subacute infarcts. No hemorrhage.   Chest radiograph with cardiomegaly with no infiltrates.   Brain MRI non acute infarcts of the left frontal and occipital lobes and old left cerebellar infarct.   EKG 113 bpm, normal axis, normal qtc, atrial fibrillation rhythm with no significant ST segment or T wave changes.   Assessment and Plan: * AKI (acute kidney injury) ([place]) Hold IV fluids for now, possible cardiorenal syndrome Follow up renal function in am, avoid hypotension and nephrotoxic medications.  Keep K at 4 and Mg at 2.   Atrial fibrillation with RVR ([place]) Patient continue with atrial fibrillation with RVR despite increase dose of metoprolol po and metoprolol IV 5 mg His blood pressure has been stable  Plan to start patient on diltiazem drip for rate control Continue anticoagulation with apixaban Continue telemetry monitoring in progressive care unit.   Essential hypertension Continue close blood pressure monitoring Hold on ARB due to AKI   Type 2 diabetes mellitus with hyperlipidemia (HCC) Continue insulin sliding scale for glucose  cover and monitoring.   Class 3 obesity (HCC) Calculated BMI is 50,1        Subjective: patient with no chest pain or dyspnea, no palpitation   Physical Exam: Vitals:   12/09/21 1600 12/09/21 1630 12/09/21 1700 12/09/21 1738  BP: (!) 110/95 136/80 (!) 130/92   Pulse: (!) 126 98 (!) 122 (!) 109  Resp:      Temp:      TempSrc:      SpO2: 96% 96% 97% 97%  Weight:      Height:       Neurology awake and alert ENT with no pallor Cardiovascular with S1 and S2 present irregularly irregular with no gallops, rubs or murmurs Respiratory with no rales or wheezing Abdomen with no distention  Non pitting lower extremity edema  Data Reviewed:    Family Communication: no family at the bedside   Disposition: Status is: Observation The patient remains OBS appropriate and will d/c before 2 midnights.  Planned Discharge Destination: Home    Author: Tawni Millers, MD 12/09/2021 5:48 PM  For on call review [email or web address].

## 2021-12-09 NOTE — Assessment & Plan Note (Addendum)
Patient's glucose remained well controlled during his hospitalization. He was placed on insulin sliding scale for glucose cover and monitoring. At the time of his discharge his fasting glucose is 144 mg/dl.  Continue with tresiba and metformin as outpatient.

## 2021-12-10 DIAGNOSIS — I4891 Unspecified atrial fibrillation: Secondary | ICD-10-CM | POA: Diagnosis not present

## 2021-12-10 DIAGNOSIS — Z823 Family history of stroke: Secondary | ICD-10-CM | POA: Diagnosis not present

## 2021-12-10 DIAGNOSIS — E785 Hyperlipidemia, unspecified: Secondary | ICD-10-CM | POA: Diagnosis present

## 2021-12-10 DIAGNOSIS — Z8616 Personal history of COVID-19: Secondary | ICD-10-CM | POA: Diagnosis not present

## 2021-12-10 DIAGNOSIS — R000 Tachycardia, unspecified: Secondary | ICD-10-CM | POA: Diagnosis present

## 2021-12-10 DIAGNOSIS — I272 Pulmonary hypertension, unspecified: Secondary | ICD-10-CM | POA: Diagnosis present

## 2021-12-10 DIAGNOSIS — Z8673 Personal history of transient ischemic attack (TIA), and cerebral infarction without residual deficits: Secondary | ICD-10-CM | POA: Diagnosis not present

## 2021-12-10 DIAGNOSIS — N179 Acute kidney failure, unspecified: Secondary | ICD-10-CM | POA: Diagnosis present

## 2021-12-10 DIAGNOSIS — G4733 Obstructive sleep apnea (adult) (pediatric): Secondary | ICD-10-CM | POA: Diagnosis present

## 2021-12-10 DIAGNOSIS — Z7901 Long term (current) use of anticoagulants: Secondary | ICD-10-CM | POA: Diagnosis not present

## 2021-12-10 DIAGNOSIS — K219 Gastro-esophageal reflux disease without esophagitis: Secondary | ICD-10-CM | POA: Diagnosis present

## 2021-12-10 DIAGNOSIS — I10 Essential (primary) hypertension: Secondary | ICD-10-CM | POA: Diagnosis not present

## 2021-12-10 DIAGNOSIS — Z7984 Long term (current) use of oral hypoglycemic drugs: Secondary | ICD-10-CM | POA: Diagnosis not present

## 2021-12-10 DIAGNOSIS — Z8249 Family history of ischemic heart disease and other diseases of the circulatory system: Secondary | ICD-10-CM | POA: Diagnosis not present

## 2021-12-10 DIAGNOSIS — F17290 Nicotine dependence, other tobacco product, uncomplicated: Secondary | ICD-10-CM | POA: Diagnosis present

## 2021-12-10 DIAGNOSIS — I482 Chronic atrial fibrillation, unspecified: Secondary | ICD-10-CM | POA: Diagnosis present

## 2021-12-10 DIAGNOSIS — E1169 Type 2 diabetes mellitus with other specified complication: Secondary | ICD-10-CM | POA: Diagnosis present

## 2021-12-10 DIAGNOSIS — I129 Hypertensive chronic kidney disease with stage 1 through stage 4 chronic kidney disease, or unspecified chronic kidney disease: Secondary | ICD-10-CM | POA: Diagnosis present

## 2021-12-10 DIAGNOSIS — Z79899 Other long term (current) drug therapy: Secondary | ICD-10-CM | POA: Diagnosis not present

## 2021-12-10 DIAGNOSIS — Z91148 Patient's other noncompliance with medication regimen for other reason: Secondary | ICD-10-CM | POA: Diagnosis not present

## 2021-12-10 DIAGNOSIS — Z6841 Body Mass Index (BMI) 40.0 and over, adult: Secondary | ICD-10-CM | POA: Diagnosis not present

## 2021-12-10 DIAGNOSIS — E1122 Type 2 diabetes mellitus with diabetic chronic kidney disease: Secondary | ICD-10-CM | POA: Diagnosis present

## 2021-12-10 DIAGNOSIS — Z20822 Contact with and (suspected) exposure to covid-19: Secondary | ICD-10-CM | POA: Diagnosis present

## 2021-12-10 DIAGNOSIS — N1831 Chronic kidney disease, stage 3a: Secondary | ICD-10-CM | POA: Diagnosis present

## 2021-12-10 DIAGNOSIS — E871 Hypo-osmolality and hyponatremia: Secondary | ICD-10-CM | POA: Diagnosis present

## 2021-12-10 LAB — GLUCOSE, CAPILLARY
Glucose-Capillary: 136 mg/dL — ABNORMAL HIGH (ref 70–99)
Glucose-Capillary: 200 mg/dL — ABNORMAL HIGH (ref 70–99)
Glucose-Capillary: 118 mg/dL — ABNORMAL HIGH (ref 70–99)
Glucose-Capillary: 174 mg/dL — ABNORMAL HIGH (ref 70–99)

## 2021-12-10 LAB — BASIC METABOLIC PANEL
Anion gap: 9 (ref 5–15)
BUN: 29 mg/dL — ABNORMAL HIGH (ref 8–23)
CO2: 24 mmol/L (ref 22–32)
Calcium: 9.3 mg/dL (ref 8.9–10.3)
Chloride: 103 mmol/L (ref 98–111)
Creatinine, Ser: 1.91 mg/dL — ABNORMAL HIGH (ref 0.61–1.24)
GFR, Estimated: 37 mL/min — ABNORMAL LOW (ref >60)
Glucose, Bld: 147 mg/dL — ABNORMAL HIGH (ref 70–99)
Potassium: 4.1 mmol/L (ref 3.5–5.1)
Sodium: 136 mmol/L (ref 135–145)

## 2021-12-10 LAB — MAGNESIUM: Magnesium: 2.2 mg/dL (ref 1.7–2.4)

## 2021-12-10 MED ORDER — DILTIAZEM HCL 60 MG PO TABS
30.0000 mg | ORAL_TABLET | Freq: Two times a day (BID) | ORAL | Status: DC
  Administered 2021-12-10: 30 mg via ORAL
  Filled 2021-12-10: qty 1

## 2021-12-10 MED ORDER — METOPROLOL TARTRATE 50 MG PO TABS
50.0000 mg | ORAL_TABLET | Freq: Two times a day (BID) | ORAL | Status: DC
  Administered 2021-12-10 – 2021-12-12 (×4): 50 mg via ORAL
  Filled 2021-12-10 (×4): qty 1

## 2021-12-10 MED ORDER — DILTIAZEM HCL 60 MG PO TABS
30.0000 mg | ORAL_TABLET | Freq: Four times a day (QID) | ORAL | Status: DC
  Administered 2021-12-10 – 2021-12-11 (×3): 30 mg via ORAL
  Filled 2021-12-10 (×3): qty 1

## 2021-12-10 MED ORDER — METOPROLOL TARTRATE 25 MG PO TABS
25.0000 mg | ORAL_TABLET | Freq: Once | ORAL | Status: AC
  Administered 2021-12-10: 25 mg via ORAL
  Filled 2021-12-10: qty 1

## 2021-12-10 NOTE — Progress Notes (Signed)
Pt refusing cpap for the night. ?

## 2021-12-10 NOTE — Progress Notes (Signed)
Occupational Therapy Treatment Patient Details Name: XUE LOW MRN: [id number] DOB: 02/06/1951 Today's Date: 12/10/2021   History of present illness Pt is a 71 y/o male who presented with confusion in set of AKI. 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 PMH: chronic a fib, obesity, HTN, HLD, DM2   OT comments  Patient received in supine and agreeable to OT session. Patient able to get to EOB and ambulate to bathroom to demonstrate toilet transfer with min guard assist. Patient able to stand at sink for hand and face hygiene with supervision.  Patient demonstrated doffing and donning socks seated on EOB with supervision. Patient performed standing tolerance from EOB with min guard assist and tolerated 4.5 minutes of standing. Patient is making good gains and discharge recommendations remain appropriate.    Recommendations for follow up therapy are one component of a multi-disciplinary discharge planning process, led by the attending physician.  Recommendations may be updated based on patient status, additional functional criteria and insurance authorization.    Follow Up Recommendations  No OT follow up (pending progress)    Assistance Recommended at Discharge Intermittent Supervision/Assistance  Patient can return home with the following  Assistance with cooking/housework;Help with stairs or ramp for entrance   Equipment Recommendations  Other (comment) (TBD, pending progression of mobility)    Recommendations for Other Services      Precautions / Restrictions Precautions Precautions: Fall;Other (comment) Precaution Comments: monitor HR Restrictions Weight Bearing Restrictions: No       Mobility Bed Mobility Overal bed mobility: Needs Assistance Bed Mobility: Supine to Sit     Supine to sit: Min guard     General bed mobility comments: min guard for safety    Transfers Overall transfer level: Needs assistance Equipment used: Rolling  walker (2 wheels) Transfers: Sit to/from Stand Sit to Stand: Min guard           General transfer comment: min guard for safety     Balance Overall balance assessment: Needs assistance Sitting-balance support: Feet supported Sitting balance-Leahy Scale: Good     Standing balance support: Single extremity supported, No upper extremity supported, During functional activity Standing balance-Leahy Scale: Fair Standing balance comment: able to stand at sink with no UE support                           ADL either performed or assessed with clinical judgement   ADL Overall ADL's : Needs assistance/impaired     Grooming: Supervision/safety;Standing;Wash/dry hands;Wash/dry face Grooming Details (indicate cue type and reason): standing at sink             Lower Body Dressing: Supervision/safety;Sitting/lateral leans Lower Body Dressing Details (indicate cue type and reason): doffed and donned socks seated on EOB Toilet Transfer: Ambulation;Regular Toilet;Rolling walker (2 wheels);Min guard Toilet Transfer Details (indicate cue type and reason): ambulated to bathroom with RW and min guard  for safety           General ADL Comments: supervision for self care tasks    Extremity/Trunk Assessment              Vision       Perception     Praxis      Cognition Arousal/Alertness: Awake/alert Behavior During Therapy: WFL for tasks assessed/performed Overall Cognitive Status: Within Functional Limits for tasks assessed  General Comments: alert and oriented,demonstrated good safety        Exercises      Shoulder Instructions       General Comments performed standing tolerance with patient tolerating 4.5 minutes of standing    Pertinent Vitals/ Pain       Pain Assessment Pain Assessment: Faces Faces Pain Scale: No hurt Pain Intervention(s): Monitored during session  Home Living                                           Prior Functioning/Environment              Frequency  Min 2X/week        Progress Toward Goals  OT Goals(current goals can now be found in the care plan section)  Progress towards OT goals: Progressing toward goals  Acute Rehab OT Goals Patient Stated Goal: go home OT Goal Formulation: With patient Time For Goal Achievement: 12/23/21 Potential to Achieve Goals: Good ADL Goals Pt Will Perform Lower Body Dressing: with modified independence;sit to/from stand Pt Will Transfer to Toilet: with modified independence;ambulating;Independently Additional ADL Goal #1: Pt to increase standing activity tolerance > 10 min during functional tasks with stable vital signs. Additional ADL Goal #2: Pt to complete 3 step trail making task without verbal cues  Plan Discharge plan remains appropriate    Co-evaluation                 AM-PAC OT "6 Clicks" Daily Activity     Outcome Measure   Help from another person eating meals?: None Help from another person taking care of personal grooming?: A Little Help from another person toileting, which includes using toliet, bedpan, or urinal?: A Little Help from another person bathing (including washing, rinsing, drying)?: A Little Help from another person to put on and taking off regular upper body clothing?: A Little Help from another person to put on and taking off regular lower body clothing?: A Little 6 Click Score: 19    End of Session Equipment Utilized During Treatment: Rolling walker (2 wheels)  OT Visit Diagnosis: Unsteadiness on feet (R26.81);Other abnormalities of gait and mobility (R26.89)   Activity Tolerance Patient tolerated treatment well   Patient Left in bed;with call bell/phone within reach;Other (comment) (seated on EOB)   Nurse Communication Mobility status        Time: 1884-1660 OT Time Calculation (min): 21 min  Charges: OT General Charges $OT Visit: 1 Visit OT  Treatments $Self Care/Home Management : 8-22 mins  Lodema Hong, [facility]  Office [place] 12/10/2021, 2:14 PM

## 2021-12-10 NOTE — Progress Notes (Addendum)
Progress Note   Patient: Kevin Goodman ZDG:[id number] DOB: [birth year] DOA: 12/08/2021     0 DOS: the patient was seen and examined on 12/10/2021   Brief hospital course: Mr. Windhorst was admitted to the hospital with the working diagnosis of AKI, complicated with atrial fibrillation with RVR.   71 yo male with the past medical history of atrial fibrillation, hypertension T2DM, dyslipidemia and obesity who presented with generalized weakness.  Patient was driving home from work and turned into the opposite street, then he lost track of the following events. He has been not compliant with his medications.  His blood pressure was 103/83, HR 134, RR 21 and 02 saturation 96%, lungs with no wheezing or rales, heart with S1 and S2 present and rhythmic, abdomen with no distention and no lower extremity edema.   Na 134, K 4,5 Cl 101 and bicarbonate 25. Glucose 106 bun 33 cr 2,31  Wbc 19,7 hgb 14.5 plt 262   Head CT with areas of loss of cortical gray white differentiation in the posterior left hemisphere and within the left frontal lobe, consistent with acute to subacute infarcts. No hemorrhage.   Chest radiograph with cardiomegaly with no infiltrates.   Brain MRI non acute infarcts of the left frontal and occipital lobes and old left cerebellar infarct.   EKG 113 bpm, normal axis, normal qtc, atrial fibrillation rhythm with no significant ST segment or T wave changes.   Patient was placed on diltiazem infusion for rate control.  Increased dose of oral metoprolol.   Assessment and Plan: * AKI (acute kidney injury) ([place]) Hyponatremia   Renal function with serum cr at 1,91 with K at 4,1 and serum bicarbonate at 24. Na 136  Plan to continue close monitoring of renal function and electrolytes. Hold on diuretics and IV fluids.  Atrial fibrillation with RVR ([place]) Patient with improved heart rate, but atrial fibrillation continue to be uncontrolled, telemetry in the 120   Plan to increase  metoprolol to 50 mg po bid and add oral diltiazem 30 mg qid Wean off diltiazem drip to heart rate 69 bpm or less.  Continue telemetry monitoring and mobility.  Anticoagulation with apixaban.   Essential hypertension Blood pressure systolic has been 595 to 638 mmHg.  Plan to continue close monitoring, continue to hold on ARB for now, until renal function more stable.    Type 2 diabetes mellitus with hyperlipidemia (HCC) Continue insulin sliding scale for glucose cover and monitoring. Fasting glucose is 147 mg/dl, controlled for inpatient criteria.    Class 3 obesity (HCC) Calculated BMI is 50,1        Subjective: Patient with no chest pain or dyspnea, no palpitations   Physical Exam: Vitals:   12/10/21 0554 12/10/21 0600 12/10/21 0605 12/10/21 0800  BP: (!) 113/90 107/65  (!) 151/97  Pulse: 94 91 80 96  Resp: 18   18  Temp:    98.1 F (36.7 C)  TempSrc:    Oral  SpO2: 99% 98% 97% 99%  Weight:   (!) 172.5 kg   Height:       Neurology awake and alert ENT with no pallor Cardiovascular with S1 and S2 present, irregularly irregular with no gallops, rubs or murmurs Respiratory with no rales or wheezing Abdomen with no distention  Asymmetric lower extremity edema, with chronic left lower extremity edema  Data Reviewed:    Family Communication: no family at the bedside   Disposition: Status is: Inpatient Remains inpatient appropriate because: atrial fibrillation  with diltiazem drip   Planned Discharge Destination: Home    Author: Tawni Millers, MD 12/10/2021 11:14 AM  For on call review [email or web address].

## 2021-12-10 NOTE — Progress Notes (Signed)
Physical Therapy Treatment Patient Details Name: Kevin Goodman MRN: [id number] DOB: [birth year] Today's Date: 12/10/2021   History of Present Illness Pt is a 71 y/o male who presented with confusion in set of AKI. 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 PMH: chronic a fib, obesity, HTN, HLD, DM2    PT Comments    Pt received supine and agreeable to session with good progress towards acute goals with emphasis on safety with transfers and gait progression for increased activity tolerance. Pt grossly min guard throughout for transfers and gait with RW and without DME, with cues needed for pacing as pt HR max up to 161bpm not sustained, RN aware. Pt motivated to return to PLOF and current plan remains appropriate to address deficits and maximize functional independence. Pt continues to benefit from skilled PT services to progress toward functional mobility goals.    Recommendations for follow up therapy are one component of a multi-disciplinary discharge planning process, led by the attending physician.  Recommendations may be updated based on patient status, additional functional criteria and insurance authorization.  Follow Up Recommendations  Home health PT     Assistance Recommended at Discharge Intermittent Supervision/Assistance  Patient can return home with the following A little help with walking and/or transfers;A little help with bathing/dressing/bathroom;Help with stairs or ramp for entrance;Assistance with cooking/housework   Equipment Recommendations  None recommended by PT    Recommendations for Other Services       Precautions / Restrictions Precautions Precautions: Fall;Other (comment) Precaution Comments: monitor HR Restrictions Weight Bearing Restrictions: No     Mobility  Bed Mobility Overal bed mobility: Needs Assistance Bed Mobility: Supine to Sit, Sit to Supine     Supine to sit: Min guard     General bed mobility  comments: min guard for safety    Transfers Overall transfer level: Needs assistance Equipment used: None, Rolling walker (2 wheels) Transfers: Sit to/from Stand Sit to Stand: Min guard           General transfer comment: min gaurd for safety, cues at start for safe hand placement wqith RW    Ambulation/Gait Ambulation/Gait assistance: Min guard Gait Distance (Feet): 200 Feet (+40 w/ no AD) Assistive device: Rolling walker (2 wheels), None Gait Pattern/deviations: Wide base of support, Step-through pattern Gait velocity: reduced     General Gait Details: cues for pacing as HR up to 171mx, not sustained, no LOB with RW, progressing to no AD at end of session for second bout of gait after seated recovery   Stairs             Wheelchair Mobility    Modified Rankin (Stroke Patients Only)       Balance Overall balance assessment: Needs assistance Sitting-balance support: Feet supported Sitting balance-Leahy Scale: Good     Standing balance support: Single extremity supported Standing balance-Leahy Scale: Fair Standing balance comment: ambulation with no no UE support                            Cognition Arousal/Alertness: Awake/alert Behavior During Therapy: WFL for tasks assessed/performed Overall Cognitive Status: Within Functional Limits for tasks assessed                                 General Comments: A&O x4, motivated for progress and wanting to do as much as possible without assist  Exercises      General Comments General comments (skin integrity, edema, etc.): time spent discussing safe mobility with increased environmental awarness      Pertinent Vitals/Pain Pain Assessment Pain Assessment: Faces Faces Pain Scale: Hurts a little bit Pain Location: LLE with pressure on lower leg Pain Descriptors / Indicators: Guarding, Grimacing Pain Intervention(s): Monitored during session, Limited activity within  patient's tolerance    Home Living                          Prior Function            PT Goals (current goals can now be found in the care plan section) Acute Rehab PT Goals PT Goal Formulation: With patient Time For Goal Achievement: 12/16/21    Frequency    Min 3X/week      PT Plan Current plan remains appropriate    Co-evaluation              AM-PAC PT "6 Clicks" Mobility   Outcome Measure  Help needed turning from your back to your side while in a flat bed without using bedrails?: A Little Help needed moving from lying on your back to sitting on the side of a flat bed without using bedrails?: A Little Help needed moving to and from a bed to a chair (including a wheelchair)?: A Little Help needed standing up from a chair using your arms (e.g., wheelchair or bedside chair)?: A Little Help needed to walk in hospital room?: A Little Help needed climbing 3-5 steps with a railing? : A Little 6 Click Score: 18    End of Session   Activity Tolerance: Patient tolerated treatment well;Other (comment) (limited by increased HR) Patient left: in bed;with call bell/phone within reach (seated EOB) Nurse Communication: Mobility status PT Visit Diagnosis: Unsteadiness on feet (R26.81);Muscle weakness (generalized) (M62.81);Difficulty in walking, not elsewhere classified (R26.2);Pain Pain - Right/Left: Left Pain - part of body: Leg     Time: 4462-8638 PT Time Calculation (min) (ACUTE ONLY): 19 min  Charges:  $Gait Training: 8-22 mins                    Kalena Mander R. PTA Acute Rehabilitation Services Office: [place] 12/10/2021, 10:26 AM

## 2021-12-10 NOTE — Progress Notes (Signed)
Pt is alert and fully oriented x 4, afebrile. On room air, SPO2 94-98%. Pt refused CPAP due to unable to tolerated it. No respiratory distress is noted.   Pt can tolerate max titration of Cardizem 15 mg/hr. EKG is Atrial fibrillation on the monitor, HR under controlled, BP stable. No acute distress. We will monitor.  Kennyth Lose, RN

## 2021-12-11 DIAGNOSIS — Z8673 Personal history of transient ischemic attack (TIA), and cerebral infarction without residual deficits: Secondary | ICD-10-CM

## 2021-12-11 LAB — BASIC METABOLIC PANEL
Anion gap: 9 (ref 5–15)
BUN: 27 mg/dL — ABNORMAL HIGH (ref 8–23)
CO2: 24 mmol/L (ref 22–32)
Calcium: 9.5 mg/dL (ref 8.9–10.3)
Chloride: 104 mmol/L (ref 98–111)
Creatinine, Ser: 1.7 mg/dL — ABNORMAL HIGH (ref 0.61–1.24)
GFR, Estimated: 43 mL/min — ABNORMAL LOW (ref >60)
Glucose, Bld: 143 mg/dL — ABNORMAL HIGH (ref 70–99)
Potassium: 4.4 mmol/L (ref 3.5–5.1)
Sodium: 137 mmol/L (ref 135–145)

## 2021-12-11 LAB — GLUCOSE, CAPILLARY
Glucose-Capillary: 127 mg/dL — ABNORMAL HIGH (ref 70–99)
Glucose-Capillary: 155 mg/dL — ABNORMAL HIGH (ref 70–99)
Glucose-Capillary: 154 mg/dL — ABNORMAL HIGH (ref 70–99)
Glucose-Capillary: 131 mg/dL — ABNORMAL HIGH (ref 70–99)

## 2021-12-11 LAB — URINE CULTURE: Culture: >=100000 — AB

## 2021-12-11 MED ORDER — DILTIAZEM HCL 60 MG PO TABS
60.0000 mg | ORAL_TABLET | Freq: Four times a day (QID) | ORAL | Status: DC
  Administered 2021-12-11 – 2021-12-12 (×4): 60 mg via ORAL
  Filled 2021-12-11 (×4): qty 1

## 2021-12-11 NOTE — Progress Notes (Signed)
HR of 138 recorded at 0944 was immediately following ambulation, changed previously green mews to yellow. Rechecked at this time and HR now 105 with a green mews.

## 2021-12-11 NOTE — Progress Notes (Signed)
Progress Note   Patient: Kevin Goodman OEU:[id number] DOB: 12-01-1950 DOA: 12/08/2021     1 DOS: the patient was seen and examined on 12/11/2021   Brief hospital course: Kevin Goodman was admitted to the hospital with the working diagnosis of AKI, complicated with atrial fibrillation with RVR.   71 yo male with the past medical history of atrial fibrillation, hypertension T2DM, dyslipidemia and obesity who presented with generalized weakness.  Patient was driving home from work and turned into the opposite street, then he lost track of the following events. He has been not compliant with his medications.  His blood pressure was 103/83, HR 134, RR 21 and 02 saturation 96%, lungs with no wheezing or rales, heart with S1 and S2 present and rhythmic, abdomen with no distention and no lower extremity edema.   Na 134, K 4,5 Cl 101 and bicarbonate 25. Glucose 106 bun 33 cr 2,31  Wbc 19,7 hgb 14.5 plt 262   Head CT with areas of loss of cortical gray white differentiation in the posterior left hemisphere and within the left frontal lobe, consistent with acute to subacute infarcts. No hemorrhage.   Chest radiograph with cardiomegaly with no infiltrates.   Brain MRI non acute infarcts of the left frontal and occipital lobes and old left cerebellar infarct.   EKG 113 bpm, normal axis, normal qtc, atrial fibrillation rhythm with no significant ST segment or T wave changes.   Patient was placed on diltiazem infusion for rate control.  Increased dose of oral metoprolol.   10/06 improved heart rate but continue with episodic RVR.   Assessment and Plan: * AKI (acute kidney injury) ([place]) Hyponatremia   Renal function with improved serum cr down to 1,70 with K at 4,4 and serum bicarbonate at 24. Plan to continue close follow up on renal function and electrolytes, avoid hypotension and nephrotoxic medications.   Atrial fibrillation with RVR ([place]) Patient continue with atrial fibrillation with RVR.    Patient has been off diltiazem drip, plan to continue with metoprolol 50 mg bid and will increase diltiazem to 60 mg q 6 hrs.  Continue telemetry monitoring Out of bed to chair tid with meals  Anticoagulation with apixaban.   Essential hypertension Blood pressure systolic has been 154 to 008 mmHg.   Patient tolerating well diltiazem and metoprolol Continue blood pressure monitoring.    Type 2 diabetes mellitus with hyperlipidemia (HCC) Continue insulin sliding scale for glucose cover and monitoring. Fasting glucose is 143 mg/dl, controlled for inpatient criteria.    Class 3 obesity (HCC) Calculated BMI is 50,1  History of CVA (cerebrovascular accident) Continue anticoagulation with apixaban. Continue statin and blood pressure control.          Subjective: Patient with improvement in dyspnea, no chest pain or palpitations   Physical Exam: Vitals:   12/11/21 0413 12/11/21 0752 12/11/21 0753 12/11/21 1150  BP: (!) 145/81  (!) 155/101 131/70  Pulse: 92  75 97  Resp: '18 18  18  '$ Temp: 98.3 F (36.8 C) 98.1 F (36.7 C)  98.6 F (37 C)  TempSrc: Oral Oral  Oral  SpO2: 100%  95% 96%  Weight:      Height:       Neurology awake and alert ENT with mild pallor Cardiovascular with S1 and S2 present, irregularly irregular with no gallops, rubs or murmurs Respiratory with no rales or wheezing  Abdomen with no distention  No lower extremity edema  Data Reviewed:    Family Communication:  no family at the bedside   Disposition: Status is: Inpatient Remains inpatient appropriate because: atrial fibrillation with RVR   Planned Discharge Destination: Home      Author: Tawni Millers, MD 12/11/2021 2:47 PM  For on call review [email or web address].

## 2021-12-11 NOTE — Progress Notes (Signed)
Mobility Specialist Progress Note:   12/11/21 0940  Mobility  Activity Ambulated with assistance in hallway  Activity Response Tolerated well  Distance Ambulated (ft) 550 ft  $Mobility charge 1 Mobility  Level of Assistance Standby assist, set-up cues, supervision of patient - no hands on  Assistive Device None  Mobility Referral Yes   Pre- Mobility:  110 HR During Mobility: 140 HR Post Mobility:   130 HR  Pt received in bed willing to participate in mobility. No complaints of pain. Left at sink with NT to get washed up.   [facility] Surveyor, mining Chat only

## 2021-12-11 NOTE — Assessment & Plan Note (Addendum)
Continue anticoagulation with apixaban. Continue statin and blood pressure control.

## 2021-12-12 LAB — GLUCOSE, CAPILLARY
Glucose-Capillary: 165 mg/dL — ABNORMAL HIGH (ref 70–99)
Glucose-Capillary: 136 mg/dL — ABNORMAL HIGH (ref 70–99)

## 2021-12-12 LAB — BASIC METABOLIC PANEL
Anion gap: 9 (ref 5–15)
BUN: 22 mg/dL (ref 8–23)
CO2: 24 mmol/L (ref 22–32)
Calcium: 9.7 mg/dL (ref 8.9–10.3)
Chloride: 103 mmol/L (ref 98–111)
Creatinine, Ser: 1.74 mg/dL — ABNORMAL HIGH (ref 0.61–1.24)
GFR, Estimated: 42 mL/min — ABNORMAL LOW (ref >60)
Glucose, Bld: 144 mg/dL — ABNORMAL HIGH (ref 70–99)
Potassium: 4.6 mmol/L (ref 3.5–5.1)
Sodium: 136 mmol/L (ref 135–145)

## 2021-12-12 MED ORDER — DILTIAZEM HCL ER COATED BEADS 120 MG PO CP24
240.0000 mg | ORAL_CAPSULE | Freq: Every day | ORAL | Status: DC
  Administered 2021-12-12: 240 mg via ORAL
  Filled 2021-12-12: qty 2

## 2021-12-12 MED ORDER — FUROSEMIDE 20 MG PO TABS: 20.0000 mg | ORAL_TABLET | Freq: Every day | ORAL | 0 refills | Status: DC | PRN

## 2021-12-12 MED ORDER — ACETAMINOPHEN 325 MG PO TABS: 650.0000 mg | ORAL_TABLET | Freq: Four times a day (QID) | ORAL | Status: DC | PRN

## 2021-12-12 MED ORDER — DILTIAZEM HCL ER COATED BEADS 240 MG PO CP24: 240.0000 mg | ORAL_CAPSULE | Freq: Every day | ORAL | 0 refills | Status: DC

## 2021-12-12 NOTE — TOC Transition Note (Signed)
Transition of Care [facility]) - CM/SW Discharge Note   Patient Details  Name: Kevin Goodman MRN: [id number] Date of Birth: [birth year]  Transition of Care [facility]) CM/SW Contact:  Konrad Penta, RN Phone Number: [phone] 12/12/2021, 11:04 AM   Clinical Narrative:   Went to bedside to discuss therapy recommendations for home health PT. [name] states " I do not need it. I still work." States he does not have any equipment needs either. States he lives in [place] with his stepson. His son will transport him home today.  Reminded [name] to follow up with PCP next week. [name] agreeable to calling PCP next week.  Made nursing and MD aware [name] declined home health arrangements.   Please re-consult TOC if needed.     Final next level of care: Home/Self Care Barriers to Discharge: No Barriers Identified   Patient Goals and CMS Choice Patient states their goals for this hospitalization and ongoing recovery are:: return home      Discharge Placement                       Discharge Plan and Services                                     Social Determinants of Health (SDOH) Interventions Housing Interventions: Patient Refused   Readmission Risk Interventions     No data to display

## 2021-12-12 NOTE — Progress Notes (Signed)
Pt refused CPAP for tonight.  

## 2021-12-12 NOTE — Progress Notes (Signed)
PT Cancellation Note  Patient Details Name: Kevin Goodman MRN: [id number] DOB: [birth year]   Cancelled Treatment:    Reason Eval/Treat Not Completed: Patient declined, no reason specified (pt states he is walking independently, is ready for D/C and does not require further therapy)   Clelia Trabucco B Elaijah Munoz 12/12/2021, 12:08 PM Bayard Males, [place] Office: [phone]

## 2021-12-12 NOTE — Discharge Summary (Addendum)
Physician Discharge Summary   Patient: Kevin Goodman MRN: [id number] DOB: 06-22-1950  Admit date:     12/08/2021  Discharge date: 12/12/21  Discharge Physician: Tawni Millers   PCP: Carrolyn Meiers, MD   Recommendations at discharge:    Patient will continue rate control with diltiazem and metoprolol Continue anticoagulation with apixaban Advised to be compliant with medical therapy.  Discontinue Naproxen due to CKD  Change furosemide to as needed for edema.   Discharge Diagnoses: Principal Problem:   AKI (acute kidney injury) ([place]) Active Problems:   Atrial fibrillation with RVR ([place])   Essential hypertension   Type 2 diabetes mellitus with hyperlipidemia ([place])   Class 3 obesity (HCC)   History of CVA (cerebrovascular accident)  Resolved Problems:   * No resolved hospital problems. [facility] Course: Kevin Goodman was admitted to the hospital with the working diagnosis of AKI, complicated with atrial fibrillation with RVR.   71 yo male with the past medical history of atrial fibrillation, hypertension T2DM, dyslipidemia, CKD and obesity who presented with generalized weakness.  Patient was driving home from work and turned into the opposite street, then he lost track of the following events. He has been not compliant with his medications.  His blood pressure was 103/83, HR 134, RR 21 and 02 saturation 96%, lungs with no wheezing or rales, heart with S1 and S2 present and rhythmic, abdomen with no distention and no lower extremity edema.   Na 134, K 4,5 Cl 101 and bicarbonate 25. Glucose 106 bun 33 cr 2,31  Wbc 19,7 hgb 14.5 plt 262   Head CT with areas of loss of cortical gray white differentiation in the posterior left hemisphere and within the left frontal lobe, consistent with acute to subacute infarcts. No hemorrhage.   Chest radiograph with cardiomegaly with no infiltrates.   Brain MRI non acute infarcts of the left frontal and occipital lobes and  old left cerebellar infarct.   EKG 113 bpm, normal axis, normal qtc, atrial fibrillation rhythm with no significant ST segment or T wave changes.   Patient was placed on diltiazem infusion for rate control along with oral metoprolol.   10/06 improved heart rate but continue with episodic RVR.  10/07 better rate control atrial fibrillation with metoprolol 50 mg bid and diltiazem 240 mg daily. I have advices Kevin Goodman to be compliant with his medical therapy to prevent hospitalizations and cerebrovascular accidents.    Assessment and Plan: * AKI (acute kidney injury) ([place]) Hyponatremia   Renal function has improved with supportive medical therapy, at the time of his discharge serum cr is 1,74 with K at 4,6 and serum bicarbonate at 22.  Change furosemide to only as needed, for edema.  Plan to follow up renal function as outpatient.   Atrial fibrillation with RVR ([place]) Patient developed rapid ventricular response atrial fibrillation and required IV diltiazem infusion, along with oral metoprolol.  Rate control was achieved, with heart rate 80 to 90, with diltiazem 240 mg and metoprolol 50 mg bid.   Noted that patient had 360 mg diltiazem prescribed, but he has been inconsistent taking his medications. Currently patient is rate controlled with lower dose of diltiazem, to prevent side effects, will continue with this current dose and will recommend close follow up.   Chads VAsc score is 4, continue anticoagulation with apixaban.   His echocardiogram from 06/2021 had preserved LV systolic function with EF 50 to 55% with moderate concentric LVH, RV systolic function preserved,  LA moderate dilatation, and no significant valvular disease.   Essential hypertension Blood pressure systolic has been 149 to 702 mmHg.   Patient tolerating well diltiazem and metoprolol At the time of discharge resume hydralazine and losartan.  Continue blood pressure monitoring as outpatient. Advised to be  compliant with is medical therapy as outpatient.    Type 2 diabetes mellitus with hyperlipidemia ([place]) Patient's glucose remained well controlled during his hospitalization. He was placed on insulin sliding scale for glucose cover and monitoring. At the time of his discharge his fasting glucose is 144 mg/dl.  Continue with tresiba and metformin as outpatient.   Class 3 obesity (HCC) Calculated BMI is 50,1  History of CVA (cerebrovascular accident) Continue anticoagulation with apixaban. Continue statin and blood pressure control.           Consultants: none  Procedures performed: none   Disposition: Home Diet recommendation:  Cardiac and Carb modified diet DISCHARGE MEDICATION: Allergies as of 12/12/2021   No Known Allergies      Medication List     STOP taking these medications    naproxen 500 MG tablet Commonly known as: NAPROSYN       TAKE these medications    acetaminophen 325 MG tablet Commonly known as: TYLENOL Take 2 tablets (650 mg total) by mouth every 6 (six) hours as needed for mild pain, fever or headache.   apixaban 5 MG Tabs tablet Commonly known as: ELIQUIS Take 1 tablet (5 mg total) by mouth 2 (two) times daily.   atorvastatin 20 MG tablet Commonly known as: LIPITOR Take 20 mg by mouth daily.   diltiazem 240 MG 24 hr capsule Commonly known as: CARDIZEM CD Take 1 capsule (240 mg total) by mouth daily. Start taking on: December 13, 2021 What changed:  medication strength how much to take   furosemide 20 MG tablet Commonly known as: LASIX Take 1 tablet (20 mg total) by mouth daily as needed for edema or fluid (leg swelling). What changed:  when to take this reasons to take this   hydrALAZINE 100 MG tablet Commonly known as: APRESOLINE Take 100 mg by mouth 2 (two) times daily.   losartan 100 MG tablet Commonly known as: COZAAR Take 1 tablet (100 mg total) by mouth daily.   metFORMIN 500 MG tablet Commonly known as:  GLUCOPHAGE Take 1,000 mg by mouth 2 (two) times daily with a meal.   metoprolol tartrate 50 MG tablet Commonly known as: LOPRESSOR Take 1 tablet (50 mg total) by mouth 2 (two) times daily.   oxymetazoline 0.05 % nasal spray Commonly known as: AFRIN Place 1 spray into both nostrils 2 (two) times daily as needed for congestion.   pantoprazole 40 MG tablet Commonly known as: PROTONIX Take 1 tablet (40 mg total) by mouth 2 (two) times daily before a meal. 30 minutes before breakfast   Tresiba FlexTouch 100 UNIT/ML FlexTouch Pen Generic drug: insulin degludec Inject 40 Units into the skin daily.        Discharge Exam: Filed Weights   12/08/21 1955 12/10/21 0605 12/12/21 0605  Weight: (!) 172.4 kg (!) 172.5 kg (!) 171.1 kg   BP (!) 142/83 (BP Location: Right Arm)   Pulse 71   Temp 97.7 F (36.5 C) (Oral)   Resp 18   Ht '6\' 1"'$  (1.854 m)   Wt (!) 171.1 kg   SpO2 95%   BMI 49.75 kg/m   Patient is feeling better, no chest pain or dyspnea.   Neurology awake  and alert ENT with no pallor Cardiovascular with S1 and S2 present, irregularly irregular with no gallops, rubs or murmurs Respiratory with no rales or wheezing Abdomen with no distention  No lower extremity edema.   Condition at discharge: stable  The results of significant diagnostics from this hospitalization (including imaging, microbiology, ancillary and laboratory) are listed below for reference.   Imaging Studies: MR BRAIN WO CONTRAST  Result Date: 12/09/2021 CLINICAL DATA:  Altered mental status EXAM: MRI HEAD WITHOUT CONTRAST TECHNIQUE: Multiplanar, multiecho pulse sequences of the brain and surrounding structures were obtained without intravenous contrast. COMPARISON:  None Available. FINDINGS: Brain: No acute infarct, mass effect or extra-axial collection. No acute or chronic hemorrhage. Nonacute infarcts of the left frontal and occipital lobes. 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 There is multifocal  periventricular white matter hyperintensity, most often a result of chronic microvascular ischemia. The midline structures are normal. Vascular: Major flow voids are preserved. Skull and upper cervical spine: Normal calvarium and skull base. Visualized upper cervical spine and soft tissues are normal. Sinuses/Orbits:No paranasal sinus fluid levels or advanced mucosal thickening. No mastoid or middle ear effusion. Normal orbits. IMPRESSION: 1. No acute intracranial abnormality. 2. 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 Electronically Signed   By: Ulyses Jarred M.D.   On: 12/09/2021 00:41   DG Chest 2 View  Result Date: 12/09/2021 CLINICAL DATA:  Weakness EXAM: CHEST - 2 VIEW COMPARISON:  06/16/2021 FINDINGS: Lungs are clear.  No pleural effusion or pneumothorax. The heart is normal in size. Mild degenerative changes of the visualized thoracolumbar spine. IMPRESSION: Normal chest radiographs. Electronically Signed   By: Julian Hy M.D.   On: 12/09/2021 00:27   CT Head Wo Contrast  Result Date: 12/08/2021 CLINICAL DATA:  Altered mental status EXAM: CT HEAD WITHOUT CONTRAST TECHNIQUE: Contiguous axial images were obtained from the base of the skull through the vertex without intravenous contrast. RADIATION DOSE REDUCTION: This exam was performed according to the departmental dose-optimization program which includes automated exposure control, adjustment of the mA and/or kV according to patient size and/or use of iterative reconstruction technique. COMPARISON:  None Available. FINDINGS: Brain: Areas of loss of cortical gray-white differentiation in the posterior left hemisphere and within the left frontal lobe. No acute hemorrhage. No midline shift other mass effect. There is an old left cerebellar infarct Vascular: No abnormal hyperdensity of the major intracranial arteries or dural venous sinuses. No intracranial atherosclerosis. Skull: The visualized  skull base, calvarium and extracranial soft tissues are normal. Sinuses/Orbits: No fluid levels or advanced mucosal thickening of the visualized paranasal sinuses. No mastoid or middle ear effusion. The orbits are normal. IMPRESSION: 1. 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 MRI recommended. 2. No acute hemorrhage. Electronically Signed   By: Ulyses Jarred M.D.   On: 12/08/2021 21:03    Microbiology: Results for orders placed or performed during the hospital encounter of 12/08/21  Resp Panel by RT-PCR (Flu A&B, Covid) Anterior Nasal Swab     Status: None   Collection Time: 12/09/21  3:11 AM   Specimen: Anterior Nasal Swab  Result Value Ref Range Status   SARS Coronavirus 2 by RT PCR NEGATIVE NEGATIVE Final    Comment: (NOTE) SARS-CoV-2 target nucleic acids are NOT DETECTED.  The SARS-CoV-2 RNA is generally detectable in upper respiratory specimens during the acute phase  of infection. The lowest concentration of SARS-CoV-2 viral copies this assay can detect is 138 copies/mL. A negative result does not preclude SARS-Cov-2 infection and should not be used as the sole basis for treatment or other patient management decisions. A negative result may occur with  improper specimen collection/handling, submission of specimen other than nasopharyngeal swab, presence of viral mutation(s) within the areas targeted by this assay, and inadequate number of viral copies(<138 copies/mL). A negative result must be combined with clinical observations, patient history, and epidemiological information. The expected result is Negative.  Fact Sheet for Patients:  EntrepreneurPulse.com.au  Fact Sheet for Healthcare Providers:  IncredibleEmployment.be  This test is no t yet approved or cleared by the [place] FDA and  has been authorized for  detection and/or diagnosis of SARS-CoV-2 by FDA under an Emergency Use Authorization (EUA). This EUA will remain  in effect (meaning this test can be used) for the duration of the COVID-19 declaration under Section 564(b)(1) of the Act, 21 U.S.C.section 360bbb-3(b)(1), unless the authorization is terminated  or revoked sooner.       Influenza A by PCR NEGATIVE NEGATIVE Final   Influenza B by PCR NEGATIVE NEGATIVE Final    Comment: (NOTE) The Xpert Xpress SARS-CoV-2/FLU/RSV plus assay is intended as an aid in the diagnosis of influenza from Nasopharyngeal swab specimens and should not be used as a sole basis for treatment. Nasal washings and aspirates are unacceptable for Xpert Xpress SARS-CoV-2/FLU/RSV testing.  Fact Sheet for Patients: EntrepreneurPulse.com.au  Fact Sheet for Healthcare Providers: IncredibleEmployment.be  This test is not yet approved or cleared by the [place] FDA and has been authorized for detection and/or diagnosis of SARS-CoV-2 by FDA under an Emergency Use Authorization (EUA). This EUA will remain in effect (meaning this test can be used) for the duration of the COVID-19 declaration under Section 564(b)(1) of the Act, 21 U.S.C. section 360bbb-3(b)(1), unless the authorization is terminated or revoked.  Performed at [facility], [place] [address] [place], [place] [address]   Urine Culture     Status: Abnormal   Collection Time: 12/09/21  3:14 AM   Specimen: Urine, Clean Catch  Result Value Ref Range Status   Specimen Description URINE, CLEAN CATCH  Final   Special Requests   Final    NONE Performed at [facility], [place] [address], [place],  [address]    Culture >=100,000 COLONIES/mL ENTEROBACTER CLOACAE (A)  Final   Report Status 12/11/2021 FINAL  Final   Organism ID, Bacteria ENTEROBACTER CLOACAE (A)  Final      Susceptibility   Enterobacter cloacae - MIC*    CEFAZOLIN >=64 RESISTANT  Resistant     CEFEPIME <=0.12 SENSITIVE Sensitive     CIPROFLOXACIN <=0.25 SENSITIVE Sensitive     GENTAMICIN <=1 SENSITIVE Sensitive     IMIPENEM <=0.25 SENSITIVE Sensitive     NITROFURANTOIN <=16 SENSITIVE Sensitive     TRIMETH/SULFA <=20 SENSITIVE Sensitive     PIP/TAZO <=4 SENSITIVE Sensitive     * >=100,000 COLONIES/mL ENTEROBACTER CLOACAE    Labs: CBC: Recent Labs  Lab 12/08/21 2230 12/09/21 0132  WBC 19.7* 19.2*  HGB 14.5 13.5  HCT 44.7 41.3  MCV 90.7 90.4  PLT 262 416   Basic Metabolic Panel: Recent Labs  Lab 12/08/21 2230 12/09/21 0132 12/09/21 0533 12/10/21 0128 12/11/21 0107 12/12/21 0103  NA 136  --  134* 136 137 136  K 4.5  --  4.1 4.1 4.4 4.6  CL 101  --  100 103 104 103  CO2 24  --  '22 24 24 24  '$ GLUCOSE 106*  --  120* 147* 143* 144*  BUN 33*  --  33* 29* 27* 22  CREATININE 2.31* 2.27* 2.17* 1.91* 1.70* 1.74*  CALCIUM 9.5  --  8.9 9.3 9.5 9.7  MG  --   --  1.7 2.2  --   --   PHOS  --   --  2.9  --   --   --    Liver Function Tests: Recent Labs  Lab 12/09/21 0533  ALBUMIN 3.2*   CBG: Recent Labs  Lab 12/11/21 0604 12/11/21 1147 12/11/21 1620 12/11/21 2110 12/12/21 0557  GLUCAP 127* 155* 154* 131* 165*    Discharge time spent: greater than 30 minutes.  Signed: Tawni Millers, MD [facility] 12/12/2021

## 2021-12-17 DIAGNOSIS — I10 Essential (primary) hypertension: Secondary | ICD-10-CM | POA: Diagnosis not present

## 2021-12-17 DIAGNOSIS — E1165 Type 2 diabetes mellitus with hyperglycemia: Secondary | ICD-10-CM | POA: Diagnosis not present

## 2021-12-17 DIAGNOSIS — Z23 Encounter for immunization: Secondary | ICD-10-CM | POA: Diagnosis not present

## 2021-12-17 DIAGNOSIS — I480 Paroxysmal atrial fibrillation: Secondary | ICD-10-CM | POA: Diagnosis not present

## 2021-12-17 DIAGNOSIS — N179 Acute kidney failure, unspecified: Secondary | ICD-10-CM | POA: Diagnosis not present

## 2021-12-21 DIAGNOSIS — I10 Essential (primary) hypertension: Secondary | ICD-10-CM | POA: Diagnosis not present

## 2021-12-23 ENCOUNTER — Encounter: Payer: Self-pay | Admitting: Cardiology

## 2021-12-23 ENCOUNTER — Ambulatory Visit: Payer: Medicare PPO | Attending: Cardiology | Admitting: Cardiology

## 2021-12-23 VITALS — BP 116/76 | HR 80 | Ht 73.0 in | Wt 382.0 lb

## 2021-12-23 DIAGNOSIS — D6869 Other thrombophilia: Secondary | ICD-10-CM

## 2021-12-23 DIAGNOSIS — I10 Essential (primary) hypertension: Secondary | ICD-10-CM

## 2021-12-23 DIAGNOSIS — I480 Paroxysmal atrial fibrillation: Secondary | ICD-10-CM | POA: Diagnosis not present

## 2021-12-23 NOTE — Patient Instructions (Signed)

## 2021-12-23 NOTE — Progress Notes (Signed)
Clinical Summary [name] is a 71 y.o.male seen today for follow up of the following medical problems.   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 Was rate controlled, plan was for outpatient DCCV in 3 weeks. At f/u mixed compliance with eliquis and DCCV was not scheduled - admit 12/2021 with AKI and generalized weakness, issues with afib with RVR that admission  - no palpitations, occasional fatigue.  - compliant with meds  2.OSA - intolerant to cpap   SH: works [employer]. Used to work as Hydrographic surveyor.  Past Medical History:  Diagnosis Date   Atrial fibrillation ([place])    a. initialy occurring in 2013  b. recurrence in 06/2020 in the setting of COVID-19 --> s/p DCCV in 07/2020   Chronic kidney disease, stage 3a ([place])    Diabetes mellitus, type 2 (HCC)    GERD (gastroesophageal reflux disease)    Gout    Hypertension    Mild pulmonary hypertension (HCC)    Morbid obesity (HCC)    OSA (obstructive sleep apnea)    Paroxysmal atrial flutter (HCC)    Sleep apnea      No Known Allergies   Current Outpatient Medications  Medication Sig Dispense Refill   acetaminophen (TYLENOL) 325 MG tablet Take 2 tablets (650 mg total) by mouth every 6 (six) hours as needed for mild pain, fever or headache.     apixaban (ELIQUIS) 5 MG TABS tablet Take 1 tablet (5 mg total) by mouth 2 (two) times daily. 60 tablet 1   atorvastatin (LIPITOR) 20 MG tablet Take 20 mg by mouth daily.     diltiazem (CARDIZEM CD) 240 MG 24 hr capsule Take 1 capsule (240 mg total) by mouth daily. 30 capsule 0   furosemide (LASIX) 20 MG tablet Take 1 tablet (20 mg total) by mouth daily as needed for edema or fluid (leg swelling). 30 tablet 0   hydrALAZINE (APRESOLINE) 100 MG tablet Take 100 mg by mouth 2 (two) times daily.     losartan (COZAAR) 100 MG tablet Take 1 tablet (100 mg total) by mouth daily. 90 tablet 3   metFORMIN (GLUCOPHAGE) 500  MG tablet Take 1,000 mg by mouth 2 (two) times daily with a meal.     metoprolol tartrate (LOPRESSOR) 50 MG tablet Take 1 tablet (50 mg total) by mouth 2 (two) times daily. 60 tablet 1   oxymetazoline (AFRIN) 0.05 % nasal spray Place 1 spray into both nostrils 2 (two) times daily as needed for congestion.     pantoprazole (PROTONIX) 40 MG tablet Take 1 tablet (40 mg total) by mouth 2 (two) times daily before a meal. 30 minutes before breakfast 180 tablet 1   TRESIBA FLEXTOUCH 100 UNIT/ML FlexTouch Pen Inject 40 Units into the skin daily.     No current facility-administered medications for this visit.     Past Surgical History:  Procedure Laterality Date   BALLOON DILATION N/A 12/25/2020   Procedure: BALLOON DILATION;  Surgeon: Eloise Harman, DO;  [facility];  Service: Endoscopy;  Laterality: N/A;   BIOPSY  12/25/2020   Procedure: BIOPSY;  Surgeon: Eloise Harman, DO;  [facility];  Service: Endoscopy;;   CARDIOVERSION N/A 07/09/2020   Procedure: CARDIOVERSION;  Surgeon: Arnoldo Lenis, MD;  Location: [facility];  Service: Endoscopy;  Laterality: N/A;   COLONOSCOPY N/A 12/08/2016   sigmoid and descending colon diverticula, four  semi-pedunculated polyps in descending and ascendign colon, few polyps in descending and ascending colon (tubular adenomas). 3 year surveillance.    COLONOSCOPY WITH PROPOFOL N/A 12/25/2020   Procedure: COLONOSCOPY WITH PROPOFOL;  Surgeon: Eloise Harman, DO;  [facility];  Service: Endoscopy;  Laterality: N/A;  9:30am   ESOPHAGOGASTRODUODENOSCOPY N/A 12/08/2016   normal esophagus s/p empiric dilation, small hiatal hernia.    ESOPHAGOGASTRODUODENOSCOPY (EGD) WITH PROPOFOL N/A 12/25/2020   Procedure: ESOPHAGOGASTRODUODENOSCOPY (EGD) WITH PROPOFOL;  Surgeon: Eloise Harman, DO;  [facility];  Service: Endoscopy;  Laterality: N/A;   FINGER SURGERY     left ring   INCISION AND DRAINAGE OF WOUND Left 04/20/2016    Procedure: LEFT THIGH DEBRIDEMENT;  Surgeon: Mickeal Skinner, MD;  Location: [facility];  Service: General;  Laterality: Left;   MALONEY DILATION N/A 12/08/2016   Procedure: Keturah Shavers;  Surgeon: Daneil Dolin, MD;  [facility];  Service: Endoscopy;  Laterality: N/A;   POLYPECTOMY  12/08/2016   Procedure: POLYPECTOMY;  Surgeon: Daneil Dolin, MD;  [facility];  Service: Endoscopy;;  colon   POLYPECTOMY  12/25/2020   Procedure: POLYPECTOMY;  Surgeon: Eloise Harman, DO;  [facility];  Service: Endoscopy;;     No Known Allergies    Family History  Problem Relation Age of Onset   Heart failure Mother    Hypertension Mother    Hypertension Father    Stroke Father    Prostate cancer Father    Stroke Brother    Prostate cancer Brother    Colon cancer Neg Hx    Colon polyps Neg Hx      Social History Mr. Corporan reports that he has been smoking cigars. He has never used smokeless tobacco. Mr. Chalfin reports that he does not currently use alcohol.   Review of Systems CONSTITUTIONAL: No weight loss, fever, chills, weakness or fatigue.  HEENT: Eyes: No visual loss, blurred vision, double vision or yellow sclerae.No hearing loss, sneezing, congestion, runny nose or sore throat.  SKIN: No rash or itching.  CARDIOVASCULAR: per hpi RESPIRATORY: No shortness of breath, cough or sputum.  GASTROINTESTINAL: No anorexia, nausea, vomiting or diarrhea. No abdominal pain or blood.  GENITOURINARY: No burning on urination, no polyuria NEUROLOGICAL: No headache, dizziness, syncope, paralysis, ataxia, numbness or tingling in the extremities. No change in bowel or bladder control.  MUSCULOSKELETAL: No muscle, back pain, joint pain or stiffness.  LYMPHATICS: No enlarged nodes. No history of splenectomy.  PSYCHIATRIC: No history of depression or anxiety.  ENDOCRINOLOGIC: No reports of sweating, cold or heat intolerance. No polyuria or polydipsia.   Marland Kitchen   Physical Examination Today's Vitals   12/23/21 1550  BP: 116/76  Pulse: 80  SpO2: 97%  Weight: (!) 382 lb (173.3 kg)  Height: '6\' 1"'$  (1.854 m)   Body mass index is 50.4 kg/m.  Gen: resting comfortably, no acute distress HEENT: no scleral icterus, pupils equal round and reactive, no palptable cervical adenopathy,  CV Resp: Clear to auscultation bilaterally GI: abdomen is soft, non-tender, non-distended, normal bowel sounds, no hepatosplenomegaly MSK: extremities are warm, no edema.  Skin: warm, no rash Neuro:  no focal deficits Psych: appropriate affect     Assessment and Plan   1.Paroxysmal afib/aflutter/acquired thrombophilia - no symptoms since recent discharge, has been compliant with meds since that time - continue rate control at this time as he is asymtpmatic, if recurrent symptoms and if has shown he  is maintaining compliance with eliquis could consider DCCV at that time  2.HTN - at goal, continue current meds   F/u 6 months     Arnoldo Lenis, M.D., F.A.C.C.

## 2022-01-17 DIAGNOSIS — I10 Essential (primary) hypertension: Secondary | ICD-10-CM | POA: Diagnosis not present

## 2022-01-17 DIAGNOSIS — E1165 Type 2 diabetes mellitus with hyperglycemia: Secondary | ICD-10-CM | POA: Diagnosis not present

## 2022-02-16 DIAGNOSIS — E1165 Type 2 diabetes mellitus with hyperglycemia: Secondary | ICD-10-CM | POA: Diagnosis not present

## 2022-02-16 DIAGNOSIS — I10 Essential (primary) hypertension: Secondary | ICD-10-CM | POA: Diagnosis not present

## 2022-02-24 DIAGNOSIS — I10 Essential (primary) hypertension: Secondary | ICD-10-CM | POA: Diagnosis not present

## 2022-02-24 DIAGNOSIS — R197 Diarrhea, unspecified: Secondary | ICD-10-CM | POA: Diagnosis not present

## 2022-02-24 DIAGNOSIS — E1165 Type 2 diabetes mellitus with hyperglycemia: Secondary | ICD-10-CM | POA: Diagnosis not present

## 2022-02-24 DIAGNOSIS — R053 Chronic cough: Secondary | ICD-10-CM | POA: Diagnosis not present

## 2022-02-24 DIAGNOSIS — N1832 Chronic kidney disease, stage 3b: Secondary | ICD-10-CM | POA: Diagnosis not present

## 2022-03-05 ENCOUNTER — Other Ambulatory Visit (HOSPITAL_COMMUNITY): Payer: Self-pay | Admitting: Nephrology

## 2022-03-05 DIAGNOSIS — R0683 Snoring: Secondary | ICD-10-CM | POA: Diagnosis not present

## 2022-03-05 DIAGNOSIS — I129 Hypertensive chronic kidney disease with stage 1 through stage 4 chronic kidney disease, or unspecified chronic kidney disease: Secondary | ICD-10-CM

## 2022-03-05 DIAGNOSIS — I480 Paroxysmal atrial fibrillation: Secondary | ICD-10-CM | POA: Diagnosis not present

## 2022-03-05 DIAGNOSIS — Z8673 Personal history of transient ischemic attack (TIA), and cerebral infarction without residual deficits: Secondary | ICD-10-CM | POA: Diagnosis not present

## 2022-03-05 DIAGNOSIS — R0681 Apnea, not elsewhere classified: Secondary | ICD-10-CM | POA: Diagnosis not present

## 2022-03-05 DIAGNOSIS — Z716 Tobacco abuse counseling: Secondary | ICD-10-CM | POA: Diagnosis not present

## 2022-03-05 DIAGNOSIS — I5032 Chronic diastolic (congestive) heart failure: Secondary | ICD-10-CM | POA: Diagnosis not present

## 2022-03-05 DIAGNOSIS — N189 Chronic kidney disease, unspecified: Secondary | ICD-10-CM | POA: Diagnosis not present

## 2022-03-05 DIAGNOSIS — E1122 Type 2 diabetes mellitus with diabetic chronic kidney disease: Secondary | ICD-10-CM

## 2022-03-27 DIAGNOSIS — E1165 Type 2 diabetes mellitus with hyperglycemia: Secondary | ICD-10-CM | POA: Diagnosis not present

## 2022-03-27 DIAGNOSIS — I10 Essential (primary) hypertension: Secondary | ICD-10-CM | POA: Diagnosis not present

## 2022-04-15 DIAGNOSIS — I5032 Chronic diastolic (congestive) heart failure: Secondary | ICD-10-CM | POA: Diagnosis not present

## 2022-04-15 DIAGNOSIS — Z8673 Personal history of transient ischemic attack (TIA), and cerebral infarction without residual deficits: Secondary | ICD-10-CM | POA: Diagnosis not present

## 2022-04-15 DIAGNOSIS — E1122 Type 2 diabetes mellitus with diabetic chronic kidney disease: Secondary | ICD-10-CM | POA: Diagnosis not present

## 2022-04-15 DIAGNOSIS — N189 Chronic kidney disease, unspecified: Secondary | ICD-10-CM | POA: Diagnosis not present

## 2022-04-15 DIAGNOSIS — E559 Vitamin D deficiency, unspecified: Secondary | ICD-10-CM | POA: Diagnosis not present

## 2022-04-15 DIAGNOSIS — I480 Paroxysmal atrial fibrillation: Secondary | ICD-10-CM | POA: Diagnosis not present

## 2022-04-15 DIAGNOSIS — I129 Hypertensive chronic kidney disease with stage 1 through stage 4 chronic kidney disease, or unspecified chronic kidney disease: Secondary | ICD-10-CM | POA: Diagnosis not present

## 2022-04-27 DIAGNOSIS — I10 Essential (primary) hypertension: Secondary | ICD-10-CM | POA: Diagnosis not present

## 2022-04-27 DIAGNOSIS — E1165 Type 2 diabetes mellitus with hyperglycemia: Secondary | ICD-10-CM | POA: Diagnosis not present

## 2022-04-30 ENCOUNTER — Institutional Professional Consult (permissible substitution): Payer: Medicare PPO | Admitting: Pulmonary Disease

## 2022-04-30 DIAGNOSIS — I5033 Acute on chronic diastolic (congestive) heart failure: Secondary | ICD-10-CM | POA: Diagnosis not present

## 2022-04-30 DIAGNOSIS — E611 Iron deficiency: Secondary | ICD-10-CM | POA: Diagnosis not present

## 2022-04-30 DIAGNOSIS — E211 Secondary hyperparathyroidism, not elsewhere classified: Secondary | ICD-10-CM | POA: Diagnosis not present

## 2022-04-30 DIAGNOSIS — N189 Chronic kidney disease, unspecified: Secondary | ICD-10-CM | POA: Diagnosis not present

## 2022-04-30 DIAGNOSIS — R809 Proteinuria, unspecified: Secondary | ICD-10-CM | POA: Diagnosis not present

## 2022-04-30 DIAGNOSIS — D472 Monoclonal gammopathy: Secondary | ICD-10-CM | POA: Diagnosis not present

## 2022-04-30 DIAGNOSIS — I129 Hypertensive chronic kidney disease with stage 1 through stage 4 chronic kidney disease, or unspecified chronic kidney disease: Secondary | ICD-10-CM | POA: Diagnosis not present

## 2022-04-30 DIAGNOSIS — R778 Other specified abnormalities of plasma proteins: Secondary | ICD-10-CM | POA: Diagnosis not present

## 2022-05-12 ENCOUNTER — Ambulatory Visit (HOSPITAL_COMMUNITY)
Admission: RE | Admit: 2022-05-12 | Discharge: 2022-05-12 | Disposition: A | Discharge status: Home / Self Care | Payer: Medicare PPO | Source: Ambulatory Visit | Attending: Nephrology | Admitting: Nephrology

## 2022-05-12 ENCOUNTER — Encounter: Payer: Self-pay | Admitting: Internal Medicine

## 2022-05-12 DIAGNOSIS — I129 Hypertensive chronic kidney disease with stage 1 through stage 4 chronic kidney disease, or unspecified chronic kidney disease: Secondary | ICD-10-CM | POA: Diagnosis not present

## 2022-05-12 DIAGNOSIS — N189 Chronic kidney disease, unspecified: Secondary | ICD-10-CM | POA: Diagnosis not present

## 2022-05-12 DIAGNOSIS — E1122 Type 2 diabetes mellitus with diabetic chronic kidney disease: Secondary | ICD-10-CM | POA: Diagnosis not present

## 2022-05-20 NOTE — Progress Notes (Signed)
[facility] [address] [place] [address]   Clinic Day:  05/21/2022  Referring physician: Liana Gerold, MD  Patient Care Team: Carrolyn Meiers, MD as PCP - General (Internal Medicine) Harl Bowie Alphonse Guild, MD as PCP - Cardiology (Cardiology) Danie Binder, MD (Inactive) as Consulting Physician (Gastroenterology) Eloise Harman, DO as Consulting Physician (Internal Medicine) Derek Jack, MD as Medical Oncologist (Hematology)   ASSESSMENT & PLAN:   Assessment:  1.  IgG kappa MGUS: - Patient seen at the request of Dr. Theador Hawthorne - 04/15/2022: Kappa light chains 50.6, lambda light chains 17.4, ratio 2.91.  SPEP with M spike 0.1 g.  IFE-IgG kappa.  24-hour urine protein 257 mg.  Urine IFE: Faint IgG kappa immunoglobulin detected.  Hb-15.1, creatinine-1.73, calcium-10.0, albumin 4.1.  Ferritin was low at 17. - Denies any new onset pains.  No prior history of blood transfusion. - No B symptoms. - His energy levels improved after starting iron tablet daily.  2.  Social/family history: - He lives at home with his stepson.  Independent of ADLs and IADLs.  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  Used to smoke 2 to 3 cigars/day starting in his 32s.  Now smokes cigars occasionally 2-3 times a year. - Brother and father had prostate cancers.  Plan:  1.  IgG kappa MGUS: - We discussed the spectrum of plasma cell disorders ranging from MGUS, smoldering myeloma to multiple myeloma. - We will obtain LDH, beta-2 microglobulin and skeletal survey today. - We will arrange for a phone visit to discuss results.  If no lytic lesions on the bone survey, he may follow-up in the clinic every 6 months with repeat myeloma labs.  2.  Low iron levels: - His ferritin was low at 17 and he started taking iron tablet daily about 5 weeks ago.  He is tolerating it reasonably well but has slight constipation. - Recommend checking ferritin and  iron panel today.   Orders Placed This Encounter  Procedures   DG Bone Survey Met    Standing Status:   Future    Number of Occurrences:   1    Standing Expiration Date:   05/21/2023    Order Specific Question:   Reason for Exam (SYMPTOM  OR DIAGNOSIS REQUIRED)    Answer:   MGUS    Order Specific Question:   Preferred imaging location?    Answer:   [facility]    Order Specific Question:   Release to patient    Answer:   Immediate   Lactate dehydrogenase    Standing Status:   Future    Number of Occurrences:   1    Standing Expiration Date:   05/21/2023   Beta 2 microglobuline, serum    Standing Status:   Future    Number of Occurrences:   1    Standing Expiration Date:   05/21/2023   Ferritin    Standing Status:   Future    Number of Occurrences:   1    Standing Expiration Date:   05/21/2023   Iron and TIBC ([facility] ONLY)    Standing Status:   Future    Number of Occurrences:   1    Standing Expiration Date:   05/21/2023   Ambulatory referral to Social Work    Referral Priority:   Routine    Referral Type:   Consultation    Referral Reason:  Specialty Services Required    Referred to Provider:   Derek Jack, MD    Number of Visits Requested:   1      I,Alexis Herring,acting as a scribe for Derek Jack, MD.,have documented all relevant documentation on the behalf of Derek Jack, MD,as directed by  Derek Jack, MD while in the presence of Derek Jack, MD.   I, Derek Jack MD, have reviewed the above documentation for accuracy and completeness, and I agree with the above.   Derek Jack, MD   3/15/20243:05 PM  CHIEF COMPLAINT/PURPOSE OF CONSULT:   Diagnosis: MGUS  Current Therapy: Under workup  HISTORY OF PRESENT ILLNESS:   Kevin Goodman is a 72 y.o. male presenting to clinic today for evaluation of MGUS at the request of Dr. Theador Hawthorne.  Labs on 04/15/2022 showed 0.1 g of IgG kappa monoclonal  protein with normal hemoglobin.  Labs were done for workup of CKD.  Today, he states that he is doing well overall. His appetite level is at 100%. His energy level is at 50%.   He takes Eliquis 5mg  BID for atrial fibrillation.  He denies any B symptoms.  His last colonoscopy was 12/25/20- biopsy showed tubular adenoma without high grade dysplasia.    PAST MEDICAL HISTORY:   Past Medical History: Past Medical History:  Diagnosis Date   Atrial fibrillation ([place])    a. initialy occurring in 2013  b. recurrence in 06/2020 in the setting of COVID-19 --> s/p DCCV in 07/2020   Chronic kidney disease, stage 3a ([place])    Diabetes mellitus, type 2 ([place])    GERD (gastroesophageal reflux disease)    Gout    Hypertension    Mild pulmonary hypertension ([place])    Morbid obesity ([facility])    OSA (obstructive sleep apnea)    Paroxysmal atrial flutter ([place])    Sleep apnea     Surgical History: Past Surgical History:  Procedure Laterality Date   BALLOON DILATION N/A 12/25/2020   Procedure: BALLOON DILATION;  Surgeon: Eloise Harman, DO;  [facility];  Service: Endoscopy;  Laterality: N/A;   BIOPSY  12/25/2020   Procedure: BIOPSY;  Surgeon: Eloise Harman, DO;  [facility];  Service: Endoscopy;;   CARDIOVERSION N/A 07/09/2020   Procedure: CARDIOVERSION;  Surgeon: Arnoldo Lenis, MD;  Location: [facility];  Service: Endoscopy;  Laterality: N/A;   COLONOSCOPY N/A 12/08/2016   sigmoid and descending colon diverticula, four semi-pedunculated polyps in descending and ascendign colon, few polyps in descending and ascending colon (tubular adenomas). 3 year surveillance.    COLONOSCOPY WITH PROPOFOL N/A 12/25/2020   Procedure: COLONOSCOPY WITH PROPOFOL;  Surgeon: Eloise Harman, DO;  [facility];  Service: Endoscopy;  Laterality: N/A;  9:30am   ESOPHAGOGASTRODUODENOSCOPY N/A 12/08/2016   normal esophagus s/p empiric dilation, small hiatal hernia.     ESOPHAGOGASTRODUODENOSCOPY (EGD) WITH PROPOFOL N/A 12/25/2020   Procedure: ESOPHAGOGASTRODUODENOSCOPY (EGD) WITH PROPOFOL;  Surgeon: Eloise Harman, DO;  [facility];  Service: Endoscopy;  Laterality: N/A;   FINGER SURGERY     left ring   INCISION AND DRAINAGE OF WOUND Left 04/20/2016   Procedure: LEFT THIGH DEBRIDEMENT;  Surgeon: Mickeal Skinner, MD;  Location: [facility];  Service: General;  Laterality: Left;   MALONEY DILATION N/A 12/08/2016   Procedure: Keturah Shavers;  Surgeon: Daneil Dolin, MD;  [facility];  Service: Endoscopy;  Laterality: N/A;   POLYPECTOMY  12/08/2016   Procedure: POLYPECTOMY;  Surgeon: Daneil Dolin, MD;  [facility];  Service: Endoscopy;;  colon   POLYPECTOMY  12/25/2020   Procedure: POLYPECTOMY;  Surgeon: Eloise Harman, DO;  [facility];  Service: Endoscopy;;    Social History: Social History   Socioeconomic History   Marital status: Widowed    Spouse name: Not on file   Number of children: Not on file   Years of education: Not on file   Highest education level: Not on file  Occupational History   Occupation: Security    Employer: [employer]  Tobacco Use   Smoking status: Some Days    Types: Cigars    Last attempt to quit: 07/08/2018    Years since quitting: 3.8   Smokeless tobacco: Never   Tobacco comments:    smoked 2-3 cigars daily  Vaping Use   Vaping Use: Never used  Substance and Sexual Activity   Alcohol use: Not Currently    Comment: occasional shot of liquour every now and then    Drug use: No   Sexual activity: Yes  Other Topics Concern   Not on file  Social History Narrative   Not on file   Social Determinants of Health   Financial Resource Strain: Not on file  Food Insecurity: Food Insecurity Present (05/21/2022)   Hunger Vital Sign    Worried About Running Out of Food in the Last Year: Sometimes true    Ran Out of Food in the Last Year: Never true   Transportation Needs: No Transportation Needs (05/21/2022)   PRAPARE - Hydrologist (Medical): No    Lack of Transportation (Non-Medical): No  Physical Activity: Not on file  Stress: Not on file  Social Connections: Not on file  Intimate Partner Violence: Not At Risk (05/21/2022)   Humiliation, Afraid, Rape, and Kick questionnaire    Fear of Current or Ex-Partner: No    Emotionally Abused: No    Physically Abused: No    Sexually Abused: No    Family History: Family History  Problem Relation Age of Onset   Heart failure Mother    Hypertension Mother    Hypertension Father    Stroke Father    Prostate cancer Father    Stroke Brother    Prostate cancer Brother    Colon cancer Neg Hx    Colon polyps Neg Hx     Current Medications:  Current Outpatient Medications:    acetaminophen (TYLENOL) 325 MG tablet, Take 2 tablets (650 mg total) by mouth every 6 (six) hours as needed for mild pain, fever or headache., Disp: , Rfl:    apixaban (ELIQUIS) 5 MG TABS tablet, Take 1 tablet (5 mg total) by mouth 2 (two) times daily., Disp: 60 tablet, Rfl: 1   atorvastatin (LIPITOR) 20 MG tablet, Take 20 mg by mouth daily., Disp: , Rfl:    dapagliflozin propanediol (FARXIGA) 5 MG TABS tablet, Take by mouth., Disp: , Rfl:    ferrous sulfate 325 (65 FE) MG EC tablet, Take by mouth., Disp: , Rfl:    hydrALAZINE (APRESOLINE) 100 MG tablet, Take 100 mg by mouth 2 (two) times daily., Disp: , Rfl:    LANTUS SOLOSTAR 100 UNIT/ML Solostar Pen, Inject into the skin., Disp: , Rfl:    losartan (COZAAR) 100 MG tablet, Take 1 tablet (100 mg total) by mouth daily., Disp: 90 tablet, Rfl: 3   metoprolol tartrate (LOPRESSOR) 50 MG tablet, Take 1 tablet (50 mg total)  by mouth 2 (two) times daily., Disp: 60 tablet, Rfl: 1   oxymetazoline (AFRIN) 0.05 % nasal spray, Place 1 spray into both nostrils 2 (two) times daily as needed for congestion., Disp: , Rfl:    pantoprazole (PROTONIX) 40 MG  tablet, Take 1 tablet (40 mg total) by mouth 2 (two) times daily before a meal. 30 minutes before breakfast, Disp: 180 tablet, Rfl: 1   TRADJENTA 5 MG TABS tablet, Take 5 mg by mouth daily., Disp: , Rfl:    diltiazem (CARDIZEM CD) 240 MG 24 hr capsule, Take 1 capsule (240 mg total) by mouth daily., Disp: 30 capsule, Rfl: 0   furosemide (LASIX) 20 MG tablet, Take 1 tablet (20 mg total) by mouth daily as needed for edema or fluid (leg swelling)., Disp: 30 tablet, Rfl: 0   Allergies: No Known Allergies  REVIEW OF SYSTEMS:   Review of Systems  Constitutional:  Negative for chills, fatigue and fever.  HENT:   Negative for lump/mass, mouth sores, nosebleeds, sore throat and trouble swallowing.   Eyes:  Negative for eye problems.  Respiratory:  Positive for shortness of breath. Negative for cough.   Cardiovascular:  Negative for chest pain, leg swelling and palpitations.  Gastrointestinal:  Negative for abdominal pain, constipation, diarrhea, nausea and vomiting.  Genitourinary:  Negative for bladder incontinence, difficulty urinating, dysuria, frequency, hematuria and nocturia.   Musculoskeletal:  Negative for arthralgias, back pain, flank pain, myalgias and neck pain.  Skin:  Negative for itching and rash.  Neurological:  Positive for dizziness. Negative for headaches and numbness.  Hematological:  Does not bruise/bleed easily.  Psychiatric/Behavioral:  Positive for sleep disturbance. Negative for depression and suicidal ideas. The patient is not nervous/anxious.   All other systems reviewed and are negative.    VITALS:   Blood pressure (!) 141/87, pulse 76, temperature 98.2 F (36.8 C), temperature source Oral, resp. rate 16, height 6\' 1"  (1.854 m), weight (!) 382 lb 6.4 oz (173.5 kg), SpO2 99 %.  Wt Readings from Last 3 Encounters:  05/21/22 (!) 382 lb 6.4 oz (173.5 kg)  12/23/21 (!) 382 lb (173.3 kg)  12/12/21 (!) 377 lb 1.6 oz (171.1 kg)    Body mass index is 50.45  kg/m.   PHYSICAL EXAM:   Physical Exam Vitals and nursing note reviewed. Exam conducted with a chaperone present.  Constitutional:      Appearance: Normal appearance.  Cardiovascular:     Rate and Rhythm: Normal rate and regular rhythm.     Pulses: Normal pulses.     Heart sounds: Normal heart sounds.  Pulmonary:     Effort: Pulmonary effort is normal.     Breath sounds: Normal breath sounds.  Abdominal:     Palpations: Abdomen is soft. There is no hepatomegaly, splenomegaly or mass.     Tenderness: There is no abdominal tenderness.  Musculoskeletal:     Right lower leg: No edema.     Left lower leg: No edema.  Lymphadenopathy:     Cervical: No cervical adenopathy.     Right cervical: No superficial, deep or posterior cervical adenopathy.    Left cervical: No superficial, deep or posterior cervical adenopathy.     Upper Body:     Right upper body: No supraclavicular or axillary adenopathy.     Left upper body: No supraclavicular or axillary adenopathy.  Neurological:     General: No focal deficit present.     Mental Status: He is alert and oriented to person, place,  and time.  Psychiatric:        Mood and Affect: Mood normal.        Behavior: Behavior normal.     LABS:      Latest Ref Rng & Units 12/09/2021    1:32 AM 12/08/2021   10:30 PM 06/18/2021    4:49 AM  CBC  WBC 4.0 - 10.5 K/uL 19.2  19.7  11.1   Hemoglobin 13.0 - 17.0 g/dL 13.5  14.5  13.2   Hematocrit 39.0 - 52.0 % 41.3  44.7  42.8   Platelets 150 - 400 K/uL 233  262  336       Latest Ref Rng & Units 12/12/2021    1:03 AM 12/11/2021    1:07 AM 12/10/2021    1:28 AM  CMP  Glucose 70 - 99 mg/dL 144  143  147   BUN 8 - 23 mg/dL 22  27  29    Creatinine 0.61 - 1.24 mg/dL 1.74  1.70  1.91   Sodium 135 - 145 mmol/L 136  137  136   Potassium 3.5 - 5.1 mmol/L 4.6  4.4  4.1   Chloride 98 - 111 mmol/L 103  104  103   CO2 22 - 32 mmol/L 24  24  24    Calcium 8.9 - 10.3 mg/dL 9.7  9.5  9.3      No results  found for: "CEA1", "CEA" / No results found for: "CEA1", "CEA" No results found for: "PSA1" No results found for: "[id number]" No results found for: "CAN125"  No results found for: "TOTALPROTELP", "ALBUMINELP", "A1GS", "A2GS", "BETS", "BETA2SER", "GAMS", "MSPIKE", "SPEI" Lab Results  Component Value Date   TIBC 410 05/21/2022   FERRITIN 25 05/21/2022   FERRITIN 33 06/14/2020   FERRITIN 36 06/13/2020   IRONPCTSAT 15 (L) 05/21/2022   Lab Results  Component Value Date   LDH 126 05/21/2022     STUDIES:   US RENAL  Result Date: 05/12/2022 CLINICAL DATA:  Chronic renal disease. EXAM: RENAL / URINARY TRACT ULTRASOUND COMPLETE COMPARISON:  None Available. FINDINGS: Right Kidney: Renal measurements: 9.6 x 5.2 x 5.8 cm = volume: 151 mL. Echogenicity within normal limits. No mass or hydronephrosis visualized. Left Kidney: Renal measurements: 9.9 x 5.6 x 5.0 cm = volume: 146 mL. Echogenicity within normal limits. No mass or hydronephrosis visualized. Bladder: Appears normal for degree of bladder distention. Other: None. IMPRESSION: Normal study. Electronically Signed   By: Dorise Bullion III M.D.   On: 05/12/2022 10:03

## 2022-05-21 ENCOUNTER — Inpatient Hospital Stay: Payer: Medicare PPO | Attending: Hematology | Admitting: Hematology

## 2022-05-21 ENCOUNTER — Inpatient Hospital Stay: Payer: Medicare PPO

## 2022-05-21 ENCOUNTER — Encounter: Payer: Self-pay | Admitting: Hematology

## 2022-05-21 ENCOUNTER — Ambulatory Visit (HOSPITAL_COMMUNITY)
Admission: RE | Admit: 2022-05-21 | Discharge: 2022-05-21 | Disposition: A | Discharge status: Home / Self Care | Payer: Medicare PPO | Source: Ambulatory Visit | Attending: Hematology | Admitting: Hematology

## 2022-05-21 VITALS — BP 141/87 | HR 76 | Temp 98.2°F | Resp 16 | Ht 73.0 in | Wt 382.4 lb

## 2022-05-21 DIAGNOSIS — F17290 Nicotine dependence, other tobacco product, uncomplicated: Secondary | ICD-10-CM

## 2022-05-21 DIAGNOSIS — D472 Monoclonal gammopathy: Secondary | ICD-10-CM

## 2022-05-21 DIAGNOSIS — Z79899 Other long term (current) drug therapy: Secondary | ICD-10-CM | POA: Diagnosis not present

## 2022-05-21 DIAGNOSIS — E1122 Type 2 diabetes mellitus with diabetic chronic kidney disease: Secondary | ICD-10-CM | POA: Diagnosis not present

## 2022-05-21 DIAGNOSIS — K219 Gastro-esophageal reflux disease without esophagitis: Secondary | ICD-10-CM | POA: Diagnosis not present

## 2022-05-21 DIAGNOSIS — I129 Hypertensive chronic kidney disease with stage 1 through stage 4 chronic kidney disease, or unspecified chronic kidney disease: Secondary | ICD-10-CM | POA: Diagnosis not present

## 2022-05-21 DIAGNOSIS — Z8249 Family history of ischemic heart disease and other diseases of the circulatory system: Secondary | ICD-10-CM

## 2022-05-21 DIAGNOSIS — Z7901 Long term (current) use of anticoagulants: Secondary | ICD-10-CM

## 2022-05-21 DIAGNOSIS — Z86010 Personal history of colonic polyps: Secondary | ICD-10-CM

## 2022-05-21 DIAGNOSIS — Z8042 Family history of malignant neoplasm of prostate: Secondary | ICD-10-CM | POA: Diagnosis not present

## 2022-05-21 DIAGNOSIS — Z8616 Personal history of COVID-19: Secondary | ICD-10-CM

## 2022-05-21 DIAGNOSIS — Z8719 Personal history of other diseases of the digestive system: Secondary | ICD-10-CM | POA: Diagnosis not present

## 2022-05-21 DIAGNOSIS — Z823 Family history of stroke: Secondary | ICD-10-CM

## 2022-05-21 DIAGNOSIS — Z794 Long term (current) use of insulin: Secondary | ICD-10-CM | POA: Diagnosis not present

## 2022-05-21 DIAGNOSIS — Z5941 Food insecurity: Secondary | ICD-10-CM | POA: Diagnosis not present

## 2022-05-21 DIAGNOSIS — N1831 Chronic kidney disease, stage 3a: Secondary | ICD-10-CM

## 2022-05-21 DIAGNOSIS — I4891 Unspecified atrial fibrillation: Secondary | ICD-10-CM | POA: Diagnosis not present

## 2022-05-21 LAB — IRON AND TIBC
Iron: 63 ug/dL (ref 45–182)
Saturation Ratios: 15 % — ABNORMAL LOW (ref 17.9–39.5)
TIBC: 410 ug/dL (ref 250–450)
UIBC: 347 ug/dL

## 2022-05-21 LAB — FERRITIN: Ferritin: 25 ng/mL (ref 24–336)

## 2022-05-21 LAB — LACTATE DEHYDROGENASE: LDH: 126 U/L (ref 98–192)

## 2022-05-21 NOTE — Patient Instructions (Signed)
[place]  Discharge Instructions  You were seen and examined today by Dr. Delton Coombes. Dr. Delton Coombes is a hematologist, meaning that he specializes in blood abnormalities. Dr. Delton Coombes discussed your past medical history, family history of cancers/blood conditions and the events that led to you being here today.  You were referred to Dr. Delton Coombes due to an abnormal protein presence on your recent blood work. This is often associated with a condition known as MGUS (monoclonal gammopathy of unknown significance).  Dr. Delton Coombes has recommended additional labs today and a bone survey to ensure there is no impact on your bones.  Follow-up as scheduled.   Thank you for choosing [facility] to provide your oncology and hematology care.   To afford each patient quality time with our provider, please arrive at least 15 minutes before your scheduled appointment time. You may need to reschedule your appointment if you arrive late (10 or more minutes). Arriving late affects you and other patients whose appointments are after yours.  Also, if you miss three or more appointments without notifying the office, you may be dismissed from the clinic at the provider's discretion.    Again, thank you for choosing [facility].  Our hope is that these requests will decrease the amount of time that you wait before being seen by our physicians.   If you have a lab appointment with the [facility] please come in thru the Main Entrance and check in at the main information desk.           _____________________________________________________________  Should you have questions after your visit to [facility], please contact our office at [phone] and follow the prompts.  Our office hours are 8:00 a.m. to 4:30 p.m. Monday - Thursday and 8:00 a.m. to 2:30 p.m. Friday.  Please note that voicemails left after 4:00 p.m. may not be  returned until the following business day.  We are closed weekends and all major holidays.  You do have access to a nurse 24-7, just call the main number to the clinic [phone] and do not press any options, hold on the line and a nurse will answer the phone.    For prescription refill requests, have your pharmacy contact our office and allow 72 hours.    Masks are optional in the cancer centers. If you would like for your care team to wear a mask while they are taking care of you, please let them know. You may have one support person who is at least 70 years old accompany you for your appointments.

## 2022-05-22 LAB — BETA 2 MICROGLOBULIN, SERUM: Beta-2 Microglobulin: 2.7 mg/L — ABNORMAL HIGH (ref 0.6–2.4)

## 2022-05-26 DIAGNOSIS — E1165 Type 2 diabetes mellitus with hyperglycemia: Secondary | ICD-10-CM | POA: Diagnosis not present

## 2022-05-26 DIAGNOSIS — I10 Essential (primary) hypertension: Secondary | ICD-10-CM | POA: Diagnosis not present

## 2022-05-27 ENCOUNTER — Inpatient Hospital Stay: Payer: Medicare PPO | Admitting: Licensed Clinical Social Worker

## 2022-05-27 DIAGNOSIS — D472 Monoclonal gammopathy: Secondary | ICD-10-CM

## 2022-05-28 NOTE — Progress Notes (Signed)
[place] Work  Initial Assessment   Kevin Goodman is a 72 y.o. year old male contacted by phone. Clinical Social Work was referred by medical provider for assessment of psychosocial needs.   SDOH (Social Determinants of Health) assessments performed: Yes SDOH Interventions    Flowsheet Row Office Visit from 05/21/2022 in [place] at [facility] ED to Hosp-Admission (Discharged) from 12/08/2021 in [facility] 4E CV SURGICAL PROGRESSIVE CARE  SDOH Interventions    Food Insecurity Interventions AMB Referral --  Housing Interventions Intervention Not Indicated Patient Refused  Transportation Interventions Intervention Not Indicated --  Utilities Interventions Intervention Not Indicated --       SDOH Screenings   Food Insecurity: Food Insecurity Present (05/21/2022)  Housing: Low Risk  (05/21/2022)  Transportation Needs: No Transportation Needs (05/21/2022)  Utilities: Not At Risk (05/21/2022)  Depression (PHQ2-9): Low Risk  (05/21/2022)  Tobacco Use: High Risk (05/21/2022)     Distress Screen completed: No     No data to display            Family/Social Information:  Housing Arrangement: patient lives with his step son.  Pt is widowed. Family members/support persons in your life? Pt has 3 biological sons who are available to provide support if needed as well.  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  Income source: Employment and Paediatric nurse concerns: No Type of concern: None Food access concerns: no Religious or spiritual practice: Not known Services Currently in place:  none  Coping/ Adjustment to diagnosis: Patient understands treatment plan and what happens next? Pt is still undergoing diagnostic testing.  A diagnosis has not yet been made. Concerns about diagnosis and/or treatment:  Pt being worked up for possible  myeloma.  Pt is concerned about how he will tolerate treatment should he need to undergo treatment for myeloma. Patient reported stressors: Anxiety/ nervousness Hopes and/or priorities: Pt's priority is to complete diagnostic testing with the hopes of negative results regarding a myeloma diagnosis Patient enjoys time with family/ friends Current coping skills/ strengths: Capable of independent living , Motivation for treatment/growth , Physical Health , and Supportive family/friends     SUMMARY: Current SDOH Barriers:  No barriers identified at this time.  Clinical Social Work Clinical Goal(s):  No clinical social work goals at this time  Interventions: Discussed common feeling and emotions when being diagnosed with cancer, and the importance of support during treatment Informed patient of the support team roles and support services at [facility] Provided [facility] contact information and encouraged patient to call with any questions or concerns CSW discussed supportive services available should he receive a cancer diagnosis.   Follow Up Plan:  CSW to follow up w/ pt and sons should he receive a cancer diagnosis.  Otherwise pt has contact information for CSW for any additional concerns which may arise.  Patient verbalizes understanding of plan: Yes    Henriette Combs, LCSW

## 2022-05-31 ENCOUNTER — Ambulatory Visit: Payer: Medicare PPO | Admitting: Hematology

## 2022-06-02 ENCOUNTER — Inpatient Hospital Stay: Payer: Medicare PPO | Admitting: Hematology

## 2022-06-02 VITALS — BP 117/76 | HR 78 | Temp 98.1°F | Resp 19 | Ht 73.0 in | Wt 383.8 lb

## 2022-06-02 DIAGNOSIS — I4891 Unspecified atrial fibrillation: Secondary | ICD-10-CM | POA: Diagnosis not present

## 2022-06-02 DIAGNOSIS — D472 Monoclonal gammopathy: Secondary | ICD-10-CM | POA: Diagnosis not present

## 2022-06-02 DIAGNOSIS — F17290 Nicotine dependence, other tobacco product, uncomplicated: Secondary | ICD-10-CM | POA: Diagnosis not present

## 2022-06-02 DIAGNOSIS — I129 Hypertensive chronic kidney disease with stage 1 through stage 4 chronic kidney disease, or unspecified chronic kidney disease: Secondary | ICD-10-CM | POA: Diagnosis not present

## 2022-06-02 DIAGNOSIS — E1122 Type 2 diabetes mellitus with diabetic chronic kidney disease: Secondary | ICD-10-CM | POA: Diagnosis not present

## 2022-06-02 DIAGNOSIS — M899 Disorder of bone, unspecified: Secondary | ICD-10-CM | POA: Diagnosis not present

## 2022-06-02 DIAGNOSIS — Z5941 Food insecurity: Secondary | ICD-10-CM | POA: Diagnosis not present

## 2022-06-02 DIAGNOSIS — K219 Gastro-esophageal reflux disease without esophagitis: Secondary | ICD-10-CM | POA: Diagnosis not present

## 2022-06-02 DIAGNOSIS — N1831 Chronic kidney disease, stage 3a: Secondary | ICD-10-CM | POA: Diagnosis not present

## 2022-06-02 NOTE — Patient Instructions (Signed)
[place] at [facility] Discharge Instructions   You were seen and examined today by Dr. Delton Coombes.  He reviewed your lab work which is stable.   Start taking iron 325 mg daily. This is an over the counter medication.   We will get an x-ray of your spine prior to your next visit. We will also repeat lab work at that time.   Return as scheduled.    Thank you for choosing [place] at [facility] to provide your oncology and hematology care.  To afford each patient quality time with our provider, please arrive at least 15 minutes before your scheduled appointment time.   If you have a lab appointment with the [facility] please come in thru the Main Entrance and check in at the main information desk.  You need to re-schedule your appointment should you arrive 10 or more minutes late.  We strive to give you quality time with our providers, and arriving late affects you and other patients whose appointments are after yours.  Also, if you no show three or more times for appointments you may be dismissed from the clinic at the providers discretion.     Again, thank you for choosing [facility].  Our hope is that these requests will decrease the amount of time that you wait before being seen by our physicians.       _____________________________________________________________  Should you have questions after your visit to [facility], please contact our office at [phone] and follow the prompts.  Our office hours are 8:00 a.m. and 4:30 p.m. Monday - Friday.  Please note that voicemails left after 4:00 p.m. may not be returned until the following business day.  We are closed weekends and major holidays.  You do have access to a nurse 24-7, just call the main number to the clinic [phone] and do not press any options, hold on the line and a nurse will answer the phone.    For prescription refill requests, have  your pharmacy contact our office and allow 72 hours.    Due to Covid, you will need to wear a mask upon entering the hospital. If you do not have a mask, a mask will be given to you at the Main Entrance upon arrival. For doctor visits, patients may have 1 support person age 59 or older with them. For treatment visits, patients can not have anyone with them due to social distancing guidelines and our immunocompromised population.

## 2022-06-02 NOTE — Progress Notes (Signed)
[facility] [address] [place] [address]   Clinic Day:  06/02/2022  Referring physician: Carrolyn Meiers*  Patient Care Team: Carrolyn Meiers, MD as PCP - General (Internal Medicine) Harl Bowie Alphonse Guild, MD as PCP - Cardiology (Cardiology) Danie Binder, MD (Inactive) as Consulting Physician (Gastroenterology) Eloise Harman, DO as Consulting Physician (Internal Medicine) Derek Jack, MD as Medical Oncologist (Hematology)   ASSESSMENT & PLAN:   Assessment: 1.  IgG kappa MGUS: - Patient seen at the request of Dr. Theador Hawthorne - 04/15/2022: Kappa light chains 50.6, lambda light chains 17.4, ratio 2.91.  SPEP with M spike 0.1 g.  IFE-IgG kappa.  24-hour urine protein 257 mg.  Urine IFE: Faint IgG kappa immunoglobulin detected.  Hb-15.1, creatinine-1.73, calcium-10.0, albumin 4.1.  Ferritin was low at 17. - Denies any new onset pains.  No prior history of blood transfusion. - No B symptoms. - His energy levels improved after starting iron tablet daily.  2.  Social/family history: - He lives at home with his stepson.  Independent of ADLs and IADLs.  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  Used to smoke 2 to 3 cigars/day starting in his 66s.  Now smokes cigars occasionally 2-3 times a year. - Brother and father had prostate cancers.  Plan: 1.  IgG kappa MGUS: - We discussed labs including LDH which was normal.  Beta-2 microglobulin was 2.7. - Reviewed skeletal survey which showed 6 questionable lucency in the midportion of the posterior aspect of T12.  Questionable lucency in the subcapital region of the left proximal humerus. - Recommend follow-up in 4 months with repeat myeloma panel.  Will also repeat thoracic spine x-ray.  2.  Low iron levels: - He started taking iron tablet daily in mid February of this year.  He is able to tolerate it although with mild constipation. - On 05/21/2022, ferritin is 25 and  percent saturation 15.  He was told to continue iron therapy.  Will repeat ferritin and iron panel in 4 months.   Orders Placed This Encounter  Procedures   DG Thoracic Spine 4V    Standing Status:   Future    Standing Expiration Date:   06/02/2023    Order Specific Question:   Reason for Exam (SYMPTOM  OR DIAGNOSIS REQUIRED)    Answer:   f/u T12 lytic lesion on recent bone scan    Order Specific Question:   Preferred imaging location?    Answer:   [facility]   CBC with Differential    Standing Status:   Future    Standing Expiration Date:   06/02/2023   Kappa/lambda light chains    Standing Status:   Future    Standing Expiration Date:   06/02/2023   Protein electrophoresis, serum    Standing Status:   Future    Standing Expiration Date:   06/02/2023   Iron and TIBC ([facility] ONLY)    Standing Status:   Future    Standing Expiration Date:   06/02/2023   Ferritin    Standing Status:   Future    Standing Expiration Date:   06/02/2023    I,Alexis Herring,acting as a scribe for Derek Jack, MD.,have documented all relevant documentation on the behalf of Derek Jack, MD,as directed by  Derek Jack, MD while in the presence of Derek Jack, MD.  I, Derek Jack MD, have reviewed the above documentation for accuracy and completeness,  and I agree with the above.    Derek Jack, MD   3/27/20246:46 PM  CHIEF COMPLAINT/PURPOSE OF CONSULT:   Diagnosis: MGUS  Current Therapy: Observation  HISTORY OF PRESENT ILLNESS:   Kevin Goodman is a 72 y.o. male presenting to clinic today for follow up of MGUS. He was last seen by me on 05/21/22 for consult.  Today, he states that he is doing well overall. His appetite level is at 100%. His energy level is at 50%. He started taking ferrous sulfate 325mg  daily x1 month. He reports slight constipation. He reports waking up some mornings and feeling like he has a "catching" pain in his  left side.  PAST MEDICAL HISTORY:   Past Medical History: Past Medical History:  Diagnosis Date   Atrial fibrillation ([place])    a. initialy occurring in 2013  b. recurrence in 06/2020 in the setting of COVID-19 --> s/p DCCV in 07/2020   Chronic kidney disease, stage 3a ([place])    Diabetes mellitus, type 2 ([place])    GERD (gastroesophageal reflux disease)    Gout    Hypertension    Mild pulmonary hypertension ([place])    Morbid obesity ([facility])    OSA (obstructive sleep apnea)    Paroxysmal atrial flutter ([place])    Sleep apnea     Surgical History: Past Surgical History:  Procedure Laterality Date   BALLOON DILATION N/A 12/25/2020   Procedure: BALLOON DILATION;  Surgeon: Eloise Harman, DO;  [facility];  Service: Endoscopy;  Laterality: N/A;   BIOPSY  12/25/2020   Procedure: BIOPSY;  Surgeon: Eloise Harman, DO;  [facility];  Service: Endoscopy;;   CARDIOVERSION N/A 07/09/2020   Procedure: CARDIOVERSION;  Surgeon: Arnoldo Lenis, MD;  Location: [facility];  Service: Endoscopy;  Laterality: N/A;   COLONOSCOPY N/A 12/08/2016   sigmoid and descending colon diverticula, four semi-pedunculated polyps in descending and ascendign colon, few polyps in descending and ascending colon (tubular adenomas). 3 year surveillance.    COLONOSCOPY WITH PROPOFOL N/A 12/25/2020   Procedure: COLONOSCOPY WITH PROPOFOL;  Surgeon: Eloise Harman, DO;  [facility];  Service: Endoscopy;  Laterality: N/A;  9:30am   ESOPHAGOGASTRODUODENOSCOPY N/A 12/08/2016   normal esophagus s/p empiric dilation, small hiatal hernia.    ESOPHAGOGASTRODUODENOSCOPY (EGD) WITH PROPOFOL N/A 12/25/2020   Procedure: ESOPHAGOGASTRODUODENOSCOPY (EGD) WITH PROPOFOL;  Surgeon: Eloise Harman, DO;  [facility];  Service: Endoscopy;  Laterality: N/A;   FINGER SURGERY     left ring   INCISION AND DRAINAGE OF WOUND Left 04/20/2016   Procedure: LEFT THIGH DEBRIDEMENT;  Surgeon: Mickeal Skinner, MD;  Location: [facility];  Service: General;  Laterality: Left;   MALONEY DILATION N/A 12/08/2016   Procedure: Keturah Shavers;  Surgeon: Daneil Dolin, MD;  [facility];  Service: Endoscopy;  Laterality: N/A;   POLYPECTOMY  12/08/2016   Procedure: POLYPECTOMY;  Surgeon: Daneil Dolin, MD;  [facility];  Service: Endoscopy;;  colon   POLYPECTOMY  12/25/2020   Procedure: POLYPECTOMY;  Surgeon: Eloise Harman, DO;  [facility];  Service: Endoscopy;;    Social History: Social History   Socioeconomic History   Marital status: Widowed    Spouse name: Not on file   Number of children: Not on file   Years of education: Not on file   Highest education level: Not on file  Occupational History   Occupation: Security    Employer:  [employer]  Tobacco Use   Smoking status: Some Days    Types: Cigars    Last attempt to quit: 07/08/2018    Years since quitting: 3.9   Smokeless tobacco: Never   Tobacco comments:    smoked 2-3 cigars daily  Vaping Use   Vaping Use: Never used  Substance and Sexual Activity   Alcohol use: Not Currently    Comment: occasional shot of liquour every now and then    Drug use: No   Sexual activity: Yes  Other Topics Concern   Not on file  Social History Narrative   Not on file   Social Determinants of Health   Financial Resource Strain: Not on file  Food Insecurity: Food Insecurity Present (05/21/2022)   Hunger Vital Sign    Worried About Running Out of Food in the Last Year: Sometimes true    Ran Out of Food in the Last Year: Never true  Transportation Needs: No Transportation Needs (05/21/2022)   PRAPARE - Hydrologist (Medical): No    Lack of Transportation (Non-Medical): No  Physical Activity: Not on file  Stress: Not on file  Social Connections: Not on file  Intimate Partner Violence: Not At Risk (05/21/2022)   Humiliation, Afraid, Rape, and Kick questionnaire     Fear of Current or Ex-Partner: No    Emotionally Abused: No    Physically Abused: No    Sexually Abused: No    Family History: Family History  Problem Relation Age of Onset   Heart failure Mother    Hypertension Mother    Hypertension Father    Stroke Father    Prostate cancer Father    Stroke Brother    Prostate cancer Brother    Colon cancer Neg Hx    Colon polyps Neg Hx     Current Medications:  Current Outpatient Medications:    acetaminophen (TYLENOL) 325 MG tablet, Take 2 tablets (650 mg total) by mouth every 6 (six) hours as needed for mild pain, fever or headache., Disp: , Rfl:    apixaban (ELIQUIS) 5 MG TABS tablet, Take 1 tablet (5 mg total) by mouth 2 (two) times daily., Disp: 60 tablet, Rfl: 1   atorvastatin (LIPITOR) 20 MG tablet, Take 20 mg by mouth daily., Disp: , Rfl:    dapagliflozin propanediol (FARXIGA) 5 MG TABS tablet, Take by mouth., Disp: , Rfl:    ferrous sulfate 325 (65 FE) MG EC tablet, Take by mouth., Disp: , Rfl:    hydrALAZINE (APRESOLINE) 100 MG tablet, Take 100 mg by mouth 2 (two) times daily., Disp: , Rfl:    LANTUS SOLOSTAR 100 UNIT/ML Solostar Pen, Inject into the skin., Disp: , Rfl:    losartan (COZAAR) 100 MG tablet, Take 1 tablet (100 mg total) by mouth daily., Disp: 90 tablet, Rfl: 3   metoprolol tartrate (LOPRESSOR) 50 MG tablet, Take 1 tablet (50 mg total) by mouth 2 (two) times daily., Disp: 60 tablet, Rfl: 1   oxymetazoline (AFRIN) 0.05 % nasal spray, Place 1 spray into both nostrils 2 (two) times daily as needed for congestion., Disp: , Rfl:    pantoprazole (PROTONIX) 40 MG tablet, Take 1 tablet (40 mg total) by mouth 2 (two) times daily before a meal. 30 minutes before breakfast, Disp: 180 tablet, Rfl: 1   TRADJENTA 5 MG TABS tablet, Take 5 mg by mouth daily., Disp: , Rfl:    diltiazem (CARDIZEM CD) 240 MG 24 hr capsule, Take 1  capsule (240 mg total) by mouth daily., Disp: 30 capsule, Rfl: 0   furosemide (LASIX) 20 MG tablet, Take 1  tablet (20 mg total) by mouth daily as needed for edema or fluid (leg swelling)., Disp: 30 tablet, Rfl: 0   Allergies: No Known Allergies  REVIEW OF SYSTEMS:   Review of Systems  Constitutional:  Positive for fatigue. Negative for chills and fever.  HENT:   Positive for trouble swallowing. Negative for lump/mass, mouth sores, nosebleeds and sore throat.   Eyes:  Negative for eye problems.  Respiratory:  Positive for shortness of breath (with exertion). Negative for cough.   Cardiovascular:  Negative for chest pain, leg swelling and palpitations.  Gastrointestinal:  Positive for constipation (mild). Negative for abdominal pain, diarrhea, nausea and vomiting.  Genitourinary:  Positive for frequency. Negative for bladder incontinence, difficulty urinating, dysuria, hematuria and nocturia.   Musculoskeletal:  Positive for myalgias (left sided trunk pain intermittently in the mornings, 4/10 in severity). Negative for arthralgias, back pain, flank pain and neck pain.  Skin:  Negative for itching and rash.  Neurological:  Negative for dizziness, headaches and numbness.  Hematological:  Does not bruise/bleed easily.  Psychiatric/Behavioral:  Positive for sleep disturbance. Negative for depression and suicidal ideas. The patient is not nervous/anxious.   All other systems reviewed and are negative.    VITALS:   Blood pressure 117/76, pulse 78, temperature 98.1 F (36.7 C), temperature source Oral, resp. rate 19, height 6\' 1"  (1.854 m), weight (!) 383 lb 12.8 oz (174.1 kg), SpO2 98 %.  Wt Readings from Last 3 Encounters:  06/02/22 (!) 383 lb 12.8 oz (174.1 kg)  05/21/22 (!) 382 lb 6.4 oz (173.5 kg)  12/23/21 (!) 382 lb (173.3 kg)    Body mass index is 50.64 kg/m.   PHYSICAL EXAM:   Physical Exam Vitals and nursing note reviewed. Exam conducted with a chaperone present.  Constitutional:      Appearance: Normal appearance.  Cardiovascular:     Rate and Rhythm: Normal rate and regular  rhythm.     Pulses: Normal pulses.     Heart sounds: Normal heart sounds.  Pulmonary:     Effort: Pulmonary effort is normal.     Breath sounds: Normal breath sounds.  Abdominal:     Palpations: Abdomen is soft. There is no hepatomegaly, splenomegaly or mass.     Tenderness: There is no abdominal tenderness.  Musculoskeletal:     Right lower leg: No edema.     Left lower leg: No edema.  Lymphadenopathy:     Cervical: No cervical adenopathy.     Right cervical: No superficial, deep or posterior cervical adenopathy.    Left cervical: No superficial, deep or posterior cervical adenopathy.     Upper Body:     Right upper body: No supraclavicular or axillary adenopathy.     Left upper body: No supraclavicular or axillary adenopathy.  Neurological:     General: No focal deficit present.     Mental Status: He is alert and oriented to person, place, and time.  Psychiatric:        Mood and Affect: Mood normal.        Behavior: Behavior normal.    LABS:      Latest Ref Rng & Units 12/09/2021    1:32 AM 12/08/2021   10:30 PM 06/18/2021    4:49 AM  CBC  WBC 4.0 - 10.5 K/uL 19.2  19.7  11.1   Hemoglobin 13.0 - 17.0  g/dL 13.5  14.5  13.2   Hematocrit 39.0 - 52.0 % 41.3  44.7  42.8   Platelets 150 - 400 K/uL 233  262  336       Latest Ref Rng & Units 12/12/2021    1:03 AM 12/11/2021    1:07 AM 12/10/2021    1:28 AM  CMP  Glucose 70 - 99 mg/dL 144  143  147   BUN 8 - 23 mg/dL 22  27  29    Creatinine 0.61 - 1.24 mg/dL 1.74  1.70  1.91   Sodium 135 - 145 mmol/L 136  137  136   Potassium 3.5 - 5.1 mmol/L 4.6  4.4  4.1   Chloride 98 - 111 mmol/L 103  104  103   CO2 22 - 32 mmol/L 24  24  24    Calcium 8.9 - 10.3 mg/dL 9.7  9.5  9.3    No results found for: "CEA1", "CEA" / No results found for: "CEA1", "CEA" No results found for: "PSA1" No results found for: "[id number]" No results found for: "CAN125"  No results found for: "TOTALPROTELP", "ALBUMINELP", "A1GS", "A2GS", "BETS", "BETA2SER",  "GAMS", "MSPIKE", "SPEI" Lab Results  Component Value Date   TIBC 410 05/21/2022   FERRITIN 25 05/21/2022   FERRITIN 33 06/14/2020   FERRITIN 36 06/13/2020   IRONPCTSAT 15 (L) 05/21/2022   Lab Results  Component Value Date   LDH 126 05/21/2022   STUDIES:   DG Bone Survey Met  Result Date: 05/25/2022 CLINICAL DATA:  MGUS. EXAM: METASTATIC BONE SURVEY COMPARISON:  Chest x-ray 12/09/2021. MRI head 12/09/2021. CT head 12/08/2021. CT cervical spine 01/07/2017. CT pelvis 04/19/2016. FINDINGS: Standard imaging of the axial and appendicular skeleton obtained. A cyst is noted in the right scaphoid, this is most likely degenerative, a focal active lesion cannot be completely excluded. Lumbar spine numbered with the lowest segmented to partially segmented lumbar shaped vertebrae on lateral view as L5. A questionable lucency is noted in the midportion of the posterior aspect of T12, a focal active lesion cannot be excluded. Tiny questionable lucency noted adjacent to the inferior endplate of L1 posteriorly. This may be degenerative, an active lesion cannot be excluded. Questionable lucency noted the subcapital region of the left proximal humerus. Lower cervical spine poorly visualized. Diffuse degenerative change noted throughout the spine and appendicular joints. Punctate bony density noted adjacent to the epicondyles of the distal left humerus, most likely related to old injury. Corticated bony density noted along the right ulnar styloid consistent with old injury. No acute bony abnormality. Calcific density noted over the left flank, this could represent a undigested pill fragment. Left renal stone cannot be completely excluded. Surgical suture noted in the upper chest. Venous calcifications noted in lower extremities. Carotid and peripheral vascular calcification. IMPRESSION: 1. A cyst is noted in the right scaphoid, this is most likely degenerative, a focal active lesion cannot be completely excluded. 2. A  questionable lucency is noted in the midportion of the posterior aspect of T12, a focal active lesion cannot be excluded. Tiny questionable lucency noted adjacent to the inferior endplate of L1 posteriorly. This may be degenerative, an active lesion cannot be excluded. Questionable lucency noted in the subcapital region of the left proximal humerus. 3. Diffuse degenerative change noted throughout the spine and appendicular joints. No acute bony abnormality. 4. Carotid and peripheral vascular disease. Electronically Signed   By: Marcello Moores  Register M.D.   On: 05/25/2022 05:49   US RENAL  Result  Date: 05/12/2022 CLINICAL DATA:  Chronic renal disease. EXAM: RENAL / URINARY TRACT ULTRASOUND COMPLETE COMPARISON:  None Available. FINDINGS: Right Kidney: Renal measurements: 9.6 x 5.2 x 5.8 cm = volume: 151 mL. Echogenicity within normal limits. No mass or hydronephrosis visualized. Left Kidney: Renal measurements: 9.9 x 5.6 x 5.0 cm = volume: 146 mL. Echogenicity within normal limits. No mass or hydronephrosis visualized. Bladder: Appears normal for degree of bladder distention. Other: None. IMPRESSION: Normal study. Electronically Signed   By: Dorise Bullion III M.D.   On: 05/12/2022 10:03

## 2022-06-05 NOTE — Progress Notes (Addendum)
Referring Provider: Dr. Wolfgang Phoenix  Primary Care Physician:  Benetta Spar, MD Primary Gastroenterologist:  Dr. Marletta Lor  Chief Complaint  Patient presents with   Anemia    HPI:   Kevin Goodman is a 72 y.o. male  with history of  MGUS, HTN, pulmonary hypertension, OSA, CKD, paroxysmal atrial fibrillation/flutter on Eliquis, GERD, dysphagia, constipation, adenomatous colon polyps, presenting today at the request of Dr. Wolfgang Phoenix for anemia.   Last EGD colonoscopy 12/25/2020: Colonoscopy: Nonbleeding internal hemorrhoids, pancolonic diverticulosis, 2 mm tubular adenoma.  Recommended repeat in 5 years. EGD: Normal esophagus s/p empiric dilation, gastritis biopsied, normal examined duodenum.  Pathology with slight chronic inflammation, no H. pylori.  Recommended PPI twice daily x12 weeks then decrease to once daily.   Today:   IDA:  Baseline hemoglobin has been in the 13 range.  04/15/2022, hemoglobin was 15.1, but ferritin low at 17, iron 67, saturation low at 19%.  Most recent labs 05/21/2022 with ferritin improved to 25, iron 63, saturation low at 15%. Started oral iron: In February.  Brbpr/Melena: Occasional toilet tissue hematochezia. Has been present for about 6 months. No melena. No rectal pain, burning, itching. NSAIDs: None.    GERD:  Well controlled on Protonix.  Occasionally forgets to take a second dose.  Dysphagia:  Resolved.    Constipation:  BMs once every 2-3 days. Hard stools at times. Chronic. Not taking anything currently.     Past Medical History:  Diagnosis Date   Atrial fibrillation    a. initialy occurring in 2013  b. recurrence in 06/2020 in the setting of COVID-19 --> s/p DCCV in 07/2020   Chronic kidney disease, stage 3a    Diabetes mellitus, type 2    GERD (gastroesophageal reflux disease)    Gout    Hypertension    Mild pulmonary hypertension    Morbid obesity    OSA (obstructive sleep apnea)    Paroxysmal atrial flutter    Sleep apnea      Past Surgical History:  Procedure Laterality Date   BALLOON DILATION N/A 12/25/2020   Procedure: BALLOON DILATION;  Surgeon: Lanelle Bal, DO;  [facility];  Service: Endoscopy;  Laterality: N/A;   BIOPSY  12/25/2020   Procedure: BIOPSY;  Surgeon: Lanelle Bal, DO;  [facility];  Service: Endoscopy;;   CARDIOVERSION N/A 07/09/2020   Procedure: CARDIOVERSION;  Surgeon: Antoine Poche, MD;  Location: [facility];  Service: Endoscopy;  Laterality: N/A;   COLONOSCOPY N/A 12/08/2016   sigmoid and descending colon diverticula, four semi-pedunculated polyps in descending and ascendign colon, few polyps in descending and ascending colon (tubular adenomas). 3 year surveillance.    COLONOSCOPY WITH PROPOFOL N/A 12/25/2020   Surgeon: Earnest Bailey K, DO; Nonbleeding internal hemorrhoids, pancolonic diverticulosis, 2 mm tubular adenoma.  Recommended repeat in 5 years.   ESOPHAGOGASTRODUODENOSCOPY N/A 12/08/2016   normal esophagus s/p empiric dilation, small hiatal hernia.    ESOPHAGOGASTRODUODENOSCOPY (EGD) WITH PROPOFOL N/A 12/25/2020   Surgeon: Lanelle Bal, DO;  Normal esophagus s/p empiric dilation, gastritis biopsied, normal examined duodenum.  Pathology with slight chronic inflammation, no H. pylori.   FINGER SURGERY     left ring   INCISION AND DRAINAGE OF WOUND Left 04/20/2016   Procedure: LEFT THIGH DEBRIDEMENT;  Surgeon: Rodman Pickle, MD;  Location: [facility] OR;  Service: General;  Laterality: Left;   MALONEY DILATION N/A 12/08/2016   Procedure: Alvy Beal;  Surgeon: Corbin Ade, MD;  [facility];  Service: Endoscopy;  Laterality: N/A;   POLYPECTOMY  12/08/2016   Procedure: POLYPECTOMY;  Surgeon: Corbin Ade, MD;  [facility];  Service: Endoscopy;;  colon   POLYPECTOMY  12/25/2020   Procedure: POLYPECTOMY;  Surgeon: Lanelle Bal, DO;  [facility];  Service: Endoscopy;;    Current  Outpatient Medications  Medication Sig Dispense Refill   apixaban (ELIQUIS) 5 MG TABS tablet Take 1 tablet (5 mg total) by mouth 2 (two) times daily. 60 tablet 1   atorvastatin (LIPITOR) 20 MG tablet Take 20 mg by mouth daily.     dapagliflozin propanediol (FARXIGA) 5 MG TABS tablet Take by mouth.     diltiazem (CARDIZEM CD) 240 MG 24 hr capsule Take 1 capsule (240 mg total) by mouth daily. 30 capsule 0   ferrous sulfate 325 (65 FE) MG EC tablet Take by mouth.     furosemide (LASIX) 20 MG tablet Take 1 tablet (20 mg total) by mouth daily as needed for edema or fluid (leg swelling). (Patient taking differently: Take 40 mg by mouth daily as needed for edema or fluid (leg swelling).) 30 tablet 0   hydrALAZINE (APRESOLINE) 100 MG tablet Take 100 mg by mouth 2 (two) times daily.     LANTUS SOLOSTAR 100 UNIT/ML Solostar Pen Inject 40 Units into the skin at bedtime.     losartan (COZAAR) 100 MG tablet Take 1 tablet (100 mg total) by mouth daily. 90 tablet 3   metoprolol tartrate (LOPRESSOR) 50 MG tablet Take 1 tablet (50 mg total) by mouth 2 (two) times daily. 60 tablet 1   oxymetazoline (AFRIN) 0.05 % nasal spray Place 1 spray into both nostrils 2 (two) times daily as needed for congestion.     pantoprazole (PROTONIX) 40 MG tablet Take 1 tablet (40 mg total) by mouth 2 (two) times daily before a meal. 30 minutes before breakfast 180 tablet 1   TRADJENTA 5 MG TABS tablet Take 5 mg by mouth daily.     No current facility-administered medications for this visit.    Allergies as of 06/07/2022   (No Known Allergies)    Family History  Problem Relation Age of Onset   Heart failure Mother    Hypertension Mother    Hypertension Father    Stroke Father    Prostate cancer Father    Stroke Brother    Prostate cancer Brother    Colon cancer Neg Hx    Colon polyps Neg Hx     Social History   Socioeconomic History   Marital status: Widowed    Spouse name: Not on file   Number of children: Not on  file   Years of education: Not on file   Highest education level: Not on file  Occupational History   Occupation: Security    Employer: [employer]  Tobacco Use   Smoking status: Some Days    Types: Cigars    Last attempt to quit: 07/08/2018    Years since quitting: 3.9   Smokeless tobacco: Never   Tobacco comments:    smoked 2-3 cigars daily  Vaping Use   Vaping Use: Never used  Substance and Sexual Activity   Alcohol use: Not Currently    Comment: occasional shot of liquour every now and then    Drug use: No   Sexual activity: Yes  Other Topics Concern   Not on file  Social History Narrative   Not on file   Social  Determinants of Health   Financial Resource Strain: Not on file  Food Insecurity: Food Insecurity Present (05/21/2022)   Hunger Vital Sign    Worried About Running Out of Food in the Last Year: Sometimes true    Ran Out of Food in the Last Year: Never true  Transportation Needs: No Transportation Needs (05/21/2022)   PRAPARE - Administrator, Civil Service (Medical): No    Lack of Transportation (Non-Medical): No  Physical Activity: Not on file  Stress: Not on file  Social Connections: Not on file  Intimate Partner Violence: Not At Risk (05/21/2022)   Humiliation, Afraid, Rape, and Kick questionnaire    Fear of Current or Ex-Partner: No    Emotionally Abused: No    Physically Abused: No    Sexually Abused: No    Review of Systems: Gen: Denies any fever, chills, cold or flulike symptoms, presyncope, syncope. CV: Denies chest pain, heart palpitations. Resp: Denies shortness of breath, cough.  GI: See HPI Heme: See HPI  Physical Exam: BP 126/83 (BP Location: Right Arm, Patient Position: Sitting, Cuff Size: Large)   Pulse 69   Temp 98.2 F (36.8 C) (Oral)   Ht 6\' 1"  (1.854 m)   Wt (!) 384 lb 3.2 oz (174.3 kg)   SpO2 97%   BMI 50.69 kg/m  General:   Alert and oriented. Pleasant and cooperative. Well-nourished and well-developed.   Head:  Normocephalic and atraumatic. Eyes:  Without icterus, sclera clear and conjunctiva pink.  Ears:  Normal auditory acuity. Lungs:  Clear to auscultation bilaterally. No wheezes, rales, or rhonchi. No distress.  Heart:  S1, S2 present without murmurs appreciated.  Abdomen:  +BS, soft, non-tender and non-distended. No HSM noted. No guarding or rebound. No masses appreciated.  Rectal:  Deferred  Msk:  Symmetrical without gross deformities. Normal posture. Extremities:  Without edema. Neurologic:  Alert and  oriented x4;  grossly normal neurologically. Skin:  Intact without significant lesions or rashes. Psych:  Alert and cooperative. Normal mood and affect.    Assessment:  72 y.o. male  with history of  MGUS, HTN, pulmonary hypertension, OSA, CKD, paroxysmal atrial fibrillation/flutter on Eliquis, GERD, dysphagia, constipation, adenomatous colon polyps, presenting today at the request of Dr. Wolfgang Phoenix for anemia, though per chart review, patient has not had anemia, rather he has had low ferritin and iron saturation.   Low ferritin/low iron saturation/rectal bleeding:  Baseline hemoglobin has been in the 13 range.  04/15/2022, hemoglobin was 15.1, but ferritin low at 17, iron 67, saturation low at 19%.  Patient started oral iron in February.  Most recent labs 05/21/2022 with ferritin improved to 25, iron 63, saturation low at 15%.  He reports 6 months of very low volume intermittent toilet tissue hematochezia likely secondary to hemorrhoids in the setting of chronic constipation.  Denies melena. Last EGD and colonoscopy in October 2022.  Colonoscopy with nonbleeding internal hemorrhoids, pancolonic diverticulosis, 2 mm tubular adenoma with recommendations to repeat colonoscopy in 5 years.  EGD with normal esophagus, gastritis with biopsies negative for H. pylori.  He has maintained on PPI twice daily. No NSAIDs.  He is chronically on Eliquis.    Due to new onset iron deficiency and last EGD and  colonoscopy were over 1 year ago, we will go ahead and update this. This will also evaluate his rectal bleeding to rule out polyps and malignancy.   Constipation: Chronic.  Not currently take anything to manage this.  Will start him on MiraLAX  daily.  GERD:  Chronic.  Well-controlled on pantoprazole 40 mg twice daily.  Occasionally forgets his second dose.  Dysphagia: Resolved s/p empiric esophageal dilation in October 2022.  Plan:  Proceed with upper endoscopy with propofol by Dr. Marletta Lor in near future. The risks, benefits, and alternatives have been discussed with the patient in detail. The patient states understanding and desires to proceed.  ASA 3 We will get approval from cardiology to hold Eliquis for 48 hours prior to procedures. Hold iron x 10 days prior to procedures. Hold Farxiga x 72 hours prior to procedures. 1 day prior to procedure: Take one half dose of Lantus (20 units at bedtime), Tradjenta as prescribed. Day of procedure: No morning diabetes medications Start MiraLAX 17 g daily in 8 ounces of water.  Requested progress report in 1-2 weeks.  Discussed importance of constipation management prior to colonoscopy. Continue Protonix 40 mg twice daily. Follow-up after procedures.   Ermalinda Memos, PA-C [facility] Gastroenterology 06/07/2022  Addendum:  Plan #1 should read as "Proceed with upper endoscopy and colonoscopy with propofol by Dr. Marletta Lor in near future. The risks, benefits, and alternatives have been discussed with the patient in detail. The patient states understanding and desires to proceed."   Ermalinda Memos, [facility] Gastroenterology

## 2022-06-07 ENCOUNTER — Telehealth: Payer: Self-pay | Admitting: *Deleted

## 2022-06-07 ENCOUNTER — Encounter: Payer: Self-pay | Admitting: Gastroenterology

## 2022-06-07 ENCOUNTER — Ambulatory Visit: Payer: Medicare PPO | Admitting: Gastroenterology

## 2022-06-07 VITALS — BP 126/83 | HR 69 | Temp 98.2°F | Ht 73.0 in | Wt 384.2 lb

## 2022-06-07 DIAGNOSIS — K5900 Constipation, unspecified: Secondary | ICD-10-CM

## 2022-06-07 DIAGNOSIS — R790 Abnormal level of blood mineral: Secondary | ICD-10-CM | POA: Diagnosis not present

## 2022-06-07 DIAGNOSIS — K625 Hemorrhage of anus and rectum: Secondary | ICD-10-CM

## 2022-06-07 DIAGNOSIS — K219 Gastro-esophageal reflux disease without esophagitis: Secondary | ICD-10-CM

## 2022-06-07 NOTE — Patient Instructions (Addendum)
We will arrange to have an upper endoscopy and colonoscopy with Dr. Abbey Chatters at [facility] in the near future. Will reach out to your cardiologist to get approval to hold Eliquis for 2 days before your procedures. You will need to hold iron for 10 days prior to your procedures. Hold Farxiga 72 hours before your procedures. 1 day prior to your procedure: Take one half dose of Lantus (20 units at bedtime).  Take Tradjenta as prescribed. Day of your procedures: Do not take any morning diabetes medications.  Constipation: Start MiraLAX 1 capful (17 g) daily in 8 ounces of water or other noncarbonated beverage of your choice. Please call with a progress report in 1-2 weeks.  If you continue to have trouble with constipation, I will have additional recommendations for you. It is very important that we have your constipation under good control before your colonoscopy to ensure that you have a good prep.  Will plan to see you back after your procedures.  Aliene Altes, PA-C [facility] Gastroenterology

## 2022-06-07 NOTE — Telephone Encounter (Signed)
  Request for patient to stop medication prior to procedure or is needing cleareance  06/07/22  Kevin Goodman [birth year]  What type of surgery is being performed? Colonoscopy/Esophagogastroduodenoscopy (EGD)   When is surgery scheduled? TBD  What type of clearance is required (medical or pharmacy to hold medication or both? medication  Are there any medications that need to be held prior to surgery and how long? Eliquis x 2 days  Name of physician performing surgery?  [facility] [employer]: [phone] Fax: [phone]  Anethesia type (none, local, MAC, general)? MAC

## 2022-06-09 NOTE — Telephone Encounter (Signed)
Ok to hold eliquis 2 days prior to procedure  Zandra Abts MD

## 2022-06-09 NOTE — Telephone Encounter (Signed)
   Patient Name: Kevin Goodman  DOB: 12-23-1950 MRN: [id number]  Primary Cardiologist: Carlyle Dolly, MD  Dr. Harl Bowie has reviewed the patient's past medical history, labs, and current medications as part of preoperative protocol coverage. The following recommendations have been made:   Per Dr. Harl Bowie patient can hold Eliquis 2 days prior to scheduled procedure.   I will route this recommendation to the requesting party via Epic fax function and remove from pre-op pool.  Please call with questions.  Mable Fill, Marissa Nestle, NP 06/09/2022, 1:07 PM

## 2022-06-09 NOTE — Telephone Encounter (Signed)
Pharmacy please advise on holding Eliquis prior to colonoscopy/EGD  scheduled for TBD.  Thank you.   

## 2022-06-18 NOTE — Telephone Encounter (Signed)
Please advise. Thank you

## 2022-06-18 NOTE — Telephone Encounter (Signed)
Please proceed with scheduling.  Hold Eliquis x 2 days prior.  You should already have instructions for diabetes medication adjustments and holding iron.  Please let me know if you do not.

## 2022-06-21 ENCOUNTER — Encounter: Payer: Self-pay | Admitting: *Deleted

## 2022-06-21 ENCOUNTER — Other Ambulatory Visit: Payer: Self-pay | Admitting: *Deleted

## 2022-06-21 MED ORDER — PEG 3350-KCL-NA BICARB-NACL 420 G PO SOLR: 4000.0000 mL | Freq: Once | ORAL | 0 refills | Status: AC

## 2022-06-21 NOTE — Telephone Encounter (Signed)
Pt has been scheduled for 08/05/22. Instructions mailed and prep sent to the pharmacy.

## 2022-06-24 DIAGNOSIS — Z0001 Encounter for general adult medical examination with abnormal findings: Secondary | ICD-10-CM | POA: Diagnosis not present

## 2022-06-24 DIAGNOSIS — I10 Essential (primary) hypertension: Secondary | ICD-10-CM | POA: Diagnosis not present

## 2022-06-24 DIAGNOSIS — I480 Paroxysmal atrial fibrillation: Secondary | ICD-10-CM | POA: Diagnosis not present

## 2022-06-24 DIAGNOSIS — Z1331 Encounter for screening for depression: Secondary | ICD-10-CM | POA: Diagnosis not present

## 2022-06-24 DIAGNOSIS — K219 Gastro-esophageal reflux disease without esophagitis: Secondary | ICD-10-CM | POA: Diagnosis not present

## 2022-06-24 DIAGNOSIS — E1165 Type 2 diabetes mellitus with hyperglycemia: Secondary | ICD-10-CM | POA: Diagnosis not present

## 2022-06-24 DIAGNOSIS — Z1389 Encounter for screening for other disorder: Secondary | ICD-10-CM | POA: Diagnosis not present

## 2022-06-24 DIAGNOSIS — Z125 Encounter for screening for malignant neoplasm of prostate: Secondary | ICD-10-CM | POA: Diagnosis not present

## 2022-06-24 DIAGNOSIS — N1832 Chronic kidney disease, stage 3b: Secondary | ICD-10-CM | POA: Diagnosis not present

## 2022-06-24 DIAGNOSIS — G4733 Obstructive sleep apnea (adult) (pediatric): Secondary | ICD-10-CM | POA: Diagnosis not present

## 2022-06-24 NOTE — Telephone Encounter (Signed)
[employer] PA: Approved Authorization #[id number]  Tracking #[id number] Dates of service 08/05/2022 - 11/05/2022

## 2022-06-30 ENCOUNTER — Ambulatory Visit: Payer: Medicare PPO | Attending: Cardiology | Admitting: Cardiology

## 2022-06-30 ENCOUNTER — Encounter: Payer: Self-pay | Admitting: Cardiology

## 2022-06-30 VITALS — BP 118/80 | HR 92 | Ht 73.0 in | Wt 395.0 lb

## 2022-06-30 DIAGNOSIS — I10 Essential (primary) hypertension: Secondary | ICD-10-CM

## 2022-06-30 DIAGNOSIS — D6869 Other thrombophilia: Secondary | ICD-10-CM | POA: Diagnosis not present

## 2022-06-30 DIAGNOSIS — E782 Mixed hyperlipidemia: Secondary | ICD-10-CM

## 2022-06-30 DIAGNOSIS — I480 Paroxysmal atrial fibrillation: Secondary | ICD-10-CM

## 2022-06-30 NOTE — Patient Instructions (Addendum)

## 2022-06-30 NOTE — Progress Notes (Signed)
Clinical Summary Kevin Goodman is a 72 y.o.male seen today for follow up of the following medical problems.    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 Was rate controlled, plan was for outpatient DCCV in 3 weeks. At f/u mixed compliance with eliquis and DCCV was not scheduled - admit 12/2021 with AKI and generalized weakness, issues with afib with RVR that admission   -no recent palpitations - no bleeding on eliquis   2.OSA - intolerant to cpap  3. MGUS - followed by oncology  4. Hyperlipidemia  12/2021 TC 92 TG 85 HDL 31 LDL 44  5. 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  Past Medical History:  Diagnosis Date   Atrial fibrillation    a. initialy occurring in 2013  b. recurrence in 06/2020 in the setting of COVID-19 --> s/p DCCV in 07/2020   Chronic kidney disease, stage 3a    Diabetes mellitus, type 2    GERD (gastroesophageal reflux disease)    Gout    Hypertension    Mild pulmonary hypertension    Morbid obesity    OSA (obstructive sleep apnea)    Paroxysmal atrial flutter    Sleep apnea      No Known Allergies   Current Outpatient Medications  Medication Sig Dispense Refill   apixaban (ELIQUIS) 5 MG TABS tablet Take 1 tablet (5 mg total) by mouth 2 (two) times daily. 60 tablet 1   atorvastatin (LIPITOR) 20 MG tablet Take 20 mg by mouth daily.     dapagliflozin propanediol (FARXIGA) 5 MG TABS tablet Take by mouth.     diltiazem (CARDIZEM CD) 240 MG 24 hr capsule Take 1 capsule (240 mg total) by mouth daily. 30 capsule 0   ferrous sulfate 325 (65 FE) MG EC tablet Take by mouth.     furosemide (LASIX) 20 MG tablet Take 1 tablet (20 mg total) by mouth daily as needed for edema or fluid (leg swelling). (Patient taking differently: Take 40 mg by mouth daily as needed for edema or fluid (leg swelling).) 30 tablet 0   hydrALAZINE (APRESOLINE) 100 MG tablet Take  100 mg by mouth 2 (two) times daily.     LANTUS SOLOSTAR 100 UNIT/ML Solostar Pen Inject 40 Units into the skin at bedtime.     losartan (COZAAR) 100 MG tablet Take 1 tablet (100 mg total) by mouth daily. 90 tablet 3   metoprolol tartrate (LOPRESSOR) 50 MG tablet Take 1 tablet (50 mg total) by mouth 2 (two) times daily. 60 tablet 1   oxymetazoline (AFRIN) 0.05 % nasal spray Place 1 spray into both nostrils 2 (two) times daily as needed for congestion.     pantoprazole (PROTONIX) 40 MG tablet Take 1 tablet (40 mg total) by mouth 2 (two) times daily before a meal. 30 minutes before breakfast 180 tablet 1   TRADJENTA 5 MG TABS tablet Take 5 mg by mouth daily.     No current facility-administered medications for this visit.     Past Surgical History:  Procedure Laterality Date   BALLOON DILATION N/A 12/25/2020   Procedure: BALLOON DILATION;  Surgeon: Lanelle Bal, DO;  [facility];  Service: Endoscopy;  Laterality: N/A;   BIOPSY  12/25/2020   Procedure: BIOPSY;  Surgeon: Lanelle Bal, DO;  [facility];  Service: Endoscopy;;   CARDIOVERSION N/A  07/09/2020   Procedure: CARDIOVERSION;  Surgeon: Antoine Poche, MD;  Location: [facility];  Service: Endoscopy;  Laterality: N/A;   COLONOSCOPY N/A 12/08/2016   sigmoid and descending colon diverticula, four semi-pedunculated polyps in descending and ascendign colon, few polyps in descending and ascending colon (tubular adenomas). 3 year surveillance.    COLONOSCOPY WITH PROPOFOL N/A 12/25/2020   Surgeon: Earnest Bailey K, DO; Nonbleeding internal hemorrhoids, pancolonic diverticulosis, 2 mm tubular adenoma.  Recommended repeat in 5 years.   ESOPHAGOGASTRODUODENOSCOPY N/A 12/08/2016   normal esophagus s/p empiric dilation, small hiatal hernia.    ESOPHAGOGASTRODUODENOSCOPY (EGD) WITH PROPOFOL N/A 12/25/2020   Surgeon: Lanelle Bal, DO;  Normal esophagus s/p empiric dilation, gastritis biopsied, normal examined  duodenum.  Pathology with slight chronic inflammation, no H. pylori.   FINGER SURGERY     left ring   INCISION AND DRAINAGE OF WOUND Left 04/20/2016   Procedure: LEFT THIGH DEBRIDEMENT;  Surgeon: Rodman Pickle, MD;  Location: [facility] OR;  Service: General;  Laterality: Left;   MALONEY DILATION N/A 12/08/2016   Procedure: Alvy Beal;  Surgeon: Corbin Ade, MD;  [facility];  Service: Endoscopy;  Laterality: N/A;   POLYPECTOMY  12/08/2016   Procedure: POLYPECTOMY;  Surgeon: Corbin Ade, MD;  [facility];  Service: Endoscopy;;  colon   POLYPECTOMY  12/25/2020   Procedure: POLYPECTOMY;  Surgeon: Lanelle Bal, DO;  [facility];  Service: Endoscopy;;     No Known Allergies    Family History  Problem Relation Age of Onset   Heart failure Mother    Hypertension Mother    Hypertension Father    Stroke Father    Prostate cancer Father    Stroke Brother    Prostate cancer Brother    Colon cancer Neg Hx    Colon polyps Neg Hx      Social History Mr. Moldovan reports that he has been smoking cigars. He has never used smokeless tobacco. Mr. Carcamo reports that he does not currently use alcohol.   Review of Systems CONSTITUTIONAL: No weight loss, fever, chills, weakness or fatigue.  HEENT: Eyes: No visual loss, blurred vision, double vision or yellow sclerae.No hearing loss, sneezing, congestion, runny nose or sore throat.  SKIN: No rash or itching.  CARDIOVASCULAR: per hpi RESPIRATORY: No shortness of breath, cough or sputum.  GASTROINTESTINAL: No anorexia, nausea, vomiting or diarrhea. No abdominal pain or blood.  GENITOURINARY: No burning on urination, no polyuria NEUROLOGICAL: No headache, dizziness, syncope, paralysis, ataxia, numbness or tingling in the extremities. No change in bowel or bladder control.  MUSCULOSKELETAL: No muscle, back pain, joint pain or stiffness.  LYMPHATICS: No enlarged nodes. No history of  splenectomy.  PSYCHIATRIC: No history of depression or anxiety.  ENDOCRINOLOGIC: No reports of sweating, cold or heat intolerance. No polyuria or polydipsia.  Marland Kitchen   Physical Examination Today's Vitals   06/30/22 1136  BP: 118/80  Pulse: 92  SpO2: 98%  Weight: (!) 395 lb (179.2 kg)  Height:  (1.854 m)   Body mass index is 52.11 kg/m.  Gen: resting comfortably, no acute distress HEENT: no scleral icterus, pupils equal round and reactive, no palptable cervical adenopathy,  CV: RRR, no mrg, no jvd Resp: Clear to auscultation bilaterally GI: abdomen is soft, non-tender, non-distended, normal bowel sounds, no hepatosplenomegaly MSK: extremities are warm, no edema.  Skin: warm, no rash Neuro:  no focal deficits Psych: appropriate affect     Assessment and  Plan   1.Paroxysmal afib/aflutter/acquired thrombophilia - no symptmos. We had not pursued repeat DCCV in the past due to mixed compliance with anticoagulation. He has been rate controlled and asymptomatic at recent visits and thus will no longer pursue - continue current meds   2.HTN -his bp is at goal, continue current meds  3. Hyperliidemia - at goal, continue current meds   F/u 6 months    Antoine Poche, M.D.

## 2022-07-14 DIAGNOSIS — E1122 Type 2 diabetes mellitus with diabetic chronic kidney disease: Secondary | ICD-10-CM | POA: Diagnosis not present

## 2022-07-14 DIAGNOSIS — R809 Proteinuria, unspecified: Secondary | ICD-10-CM | POA: Diagnosis not present

## 2022-07-14 DIAGNOSIS — N1832 Chronic kidney disease, stage 3b: Secondary | ICD-10-CM | POA: Diagnosis not present

## 2022-07-14 DIAGNOSIS — Z79899 Other long term (current) drug therapy: Secondary | ICD-10-CM | POA: Diagnosis not present

## 2022-07-15 DIAGNOSIS — Z125 Encounter for screening for malignant neoplasm of prostate: Secondary | ICD-10-CM | POA: Diagnosis not present

## 2022-07-16 DIAGNOSIS — D472 Monoclonal gammopathy: Secondary | ICD-10-CM | POA: Diagnosis not present

## 2022-07-16 DIAGNOSIS — I5032 Chronic diastolic (congestive) heart failure: Secondary | ICD-10-CM | POA: Diagnosis not present

## 2022-07-16 DIAGNOSIS — I480 Paroxysmal atrial fibrillation: Secondary | ICD-10-CM | POA: Diagnosis not present

## 2022-07-16 DIAGNOSIS — I129 Hypertensive chronic kidney disease with stage 1 through stage 4 chronic kidney disease, or unspecified chronic kidney disease: Secondary | ICD-10-CM | POA: Diagnosis not present

## 2022-07-19 ENCOUNTER — Other Ambulatory Visit: Payer: Self-pay

## 2022-07-19 ENCOUNTER — Emergency Department (HOSPITAL_COMMUNITY)
Admission: EM | Admit: 2022-07-19 | Discharge: 2022-07-20 | Disposition: A | Discharge status: Home / Self Care | Payer: Medicare PPO | Attending: Emergency Medicine | Admitting: Emergency Medicine

## 2022-07-19 DIAGNOSIS — L500 Allergic urticaria: Secondary | ICD-10-CM

## 2022-07-19 DIAGNOSIS — S1086XA Insect bite of other specified part of neck, initial encounter: Secondary | ICD-10-CM | POA: Diagnosis not present

## 2022-07-19 DIAGNOSIS — Z7901 Long term (current) use of anticoagulants: Secondary | ICD-10-CM | POA: Diagnosis not present

## 2022-07-19 DIAGNOSIS — E119 Type 2 diabetes mellitus without complications: Secondary | ICD-10-CM | POA: Diagnosis not present

## 2022-07-19 DIAGNOSIS — N1831 Chronic kidney disease, stage 3a: Secondary | ICD-10-CM | POA: Insufficient documentation

## 2022-07-19 DIAGNOSIS — W57XXXA Bitten or stung by nonvenomous insect and other nonvenomous arthropods, initial encounter: Secondary | ICD-10-CM | POA: Diagnosis not present

## 2022-07-19 DIAGNOSIS — I129 Hypertensive chronic kidney disease with stage 1 through stage 4 chronic kidney disease, or unspecified chronic kidney disease: Secondary | ICD-10-CM | POA: Insufficient documentation

## 2022-07-19 NOTE — ED Triage Notes (Signed)
Pt states he was outside this weekend when he believes he got bit by some kind of insect on back of his neck. C/o itching and swelling to area.

## 2022-07-20 MED ORDER — DIPHENHYDRAMINE HCL 25 MG PO CAPS
25.0000 mg | ORAL_CAPSULE | Freq: Once | ORAL | Status: AC
  Administered 2022-07-20: 25 mg via ORAL
  Filled 2022-07-20: qty 1

## 2022-07-20 NOTE — Discharge Instructions (Signed)
Were seen today with concern for an insect bite.  You do appear to have a slight rash over the posterior aspect of the neck.  Try to avoid scratching.  Apply triple antibiotic ointment.  Take Benadryl as needed for itching.

## 2022-07-20 NOTE — ED Provider Notes (Signed)
[place] EMERGENCY DEPARTMENT AT [facility] Provider Note   CSN: [id number] Arrival date & time: 07/19/22  2325     History  Chief Complaint  Patient presents with   Insect Bite    Kevin Goodman is a 72 y.o. male.  HPI     This is a 72 year old male who presents with concern for bug bite.  Patient reports that he was outside on Saturday.  Since that time he has had itching and swelling over the posterior neck.  He has been scratching.  He has not take anything for symptoms.  Denies systemic symptoms such as fever or rash.  Home Medications Prior to Admission medications   Medication Sig Start Date End Date Taking? Authorizing Provider  apixaban (ELIQUIS) 5 MG TABS tablet Take 1 tablet (5 mg total) by mouth 2 (two) times daily. 06/19/21   Johnson, Clanford L, MD  atorvastatin (LIPITOR) 20 MG tablet Take 20 mg by mouth daily.    [provider]  dapagliflozin propanediol (FARXIGA) 5 MG TABS tablet Take by mouth. 04/30/22 04/30/23  [provider]  diltiazem (CARDIZEM CD) 240 MG 24 hr capsule Take 1 capsule (240 mg total) by mouth daily. 12/13/21 06/07/22  Arrien, York Ram, MD  ferrous sulfate 325 (65 FE) MG EC tablet Take by mouth. 04/30/22 04/30/23  [provider]  furosemide (LASIX) 20 MG tablet Take 1 tablet (20 mg total) by mouth daily as needed for edema or fluid (leg swelling). Patient taking differently: Take 40 mg by mouth daily as needed for edema or fluid (leg swelling). 12/12/21 06/07/22  Arrien, York Ram, MD  hydrALAZINE (APRESOLINE) 100 MG tablet Take 100 mg by mouth 2 (two) times daily.    [provider]  LANTUS SOLOSTAR 100 UNIT/ML Solostar Pen Inject 40 Units into the skin at bedtime. 05/18/22   [provider]  losartan (COZAAR) 100 MG tablet Take 1 tablet (100 mg total) by mouth daily. 10/27/20 12/08/22  Ellsworth Lennox, PA-C  metoprolol tartrate (LOPRESSOR) 50 MG tablet Take 1 tablet (50 mg total)  by mouth 2 (two) times daily. 06/19/21   Johnson, Clanford L, MD  oxymetazoline (AFRIN) 0.05 % nasal spray Place 1 spray into both nostrils 2 (two) times daily as needed for congestion.    [provider]  pantoprazole (PROTONIX) 40 MG tablet Take 1 tablet (40 mg total) by mouth 2 (two) times daily before a meal. 30 minutes before breakfast 12/25/20 12/10/22  Carver, Charles K, DO  TRADJENTA 5 MG TABS tablet Take 5 mg by mouth daily.    [provider]      Allergies    Patient has no known allergies.    Review of Systems   Review of Systems  Constitutional:  Negative for fever.  Skin:  Negative for rash.  All other systems reviewed and are negative.   Physical Exam Updated Vital Signs BP 120/75 (BP Location: Left Arm)   Pulse 70   Temp 98.3 F (36.8 C) (Oral)   Resp 18   Ht 1.854 m (6\' 1" )   Wt (!) 180 kg   SpO2 96%   BMI 52.36 kg/m  Physical Exam Vitals and nursing note reviewed.  Constitutional:      Appearance: He is well-developed. He is obese.     Comments: Morbidly obese, nontoxic  HENT:     Head: Normocephalic and atraumatic.  Eyes:     Pupils: Pupils are equal, round, and reactive to light.  Neck:     Comments: Posterior neck with erythema and whelps at the base of the neck, there are overlying excoriations, no lymphadenopathy Cardiovascular:     Rate and Rhythm: Normal rate and regular rhythm.  Pulmonary:     Effort: Pulmonary effort is normal. No respiratory distress.  Abdominal:     Palpations: Abdomen is soft.  Skin:    General: Skin is warm and dry.  Neurological:     Mental Status: He is alert and oriented to person, place, and time.  Psychiatric:        Mood and Affect: Mood normal.     ED Results / Procedures / Treatments   Labs (all labs ordered are listed, but only abnormal results are displayed) Labs Reviewed - No data to display  EKG None  Radiology No results found.  Procedures Procedures    Medications Ordered  in ED Medications  diphenhydrAMINE (BENADRYL) capsule 25 mg (has no administration in time range)    ED Course/ Medical Decision Making/ A&P                             Medical Decision Making  This patient presents to the ED for concern of insect bite, this involves an extensive number of treatment options, and is a complaint that carries with it a high risk of complications and morbidity.  I considered the following differential and admission for this acute, potentially life threatening condition.  The differential diagnosis includes bite, urticaria, lymphadenopathy  MDM:    This is a 72 year old male who presents with concern for insect bites over the posterior neck.  He is nontoxic and vital signs are reassuring.  Patient has some whelps like areas over the back of the neck with overlying excoriations likely related to scratching.  Patient has not taken anything for symptoms.  Suspect some element of a local allergic response or histamine reaction.  He was given Benadryl.  Given that he is at high risk for overlying infection because of the excoriations, recommend applying antibiotic ointment.  No obvious current signs or symptoms of infection.  (Labs, imaging, consults)  Labs: I Ordered, and personally interpreted labs.  The pertinent results include: None  Imaging Studies ordered: I ordered imaging studies including none I independently visualized and interpreted imaging. I agree with the radiologist interpretation  Additional history obtained from chart review.  External records from outside source obtained and reviewed including prior evaluations  Cardiac Monitoring: The patient was not maintained on a cardiac monitor.  If on the cardiac monitor, I personally viewed and interpreted the cardiac monitored which showed an underlying rhythm of: N/A  Reevaluation: After the interventions noted above, I reevaluated the patient and found that they have :improved  Social  Determinants of Health:  was independently  Disposition: Discharge  Co morbidities that complicate the patient evaluation  Past Medical History:  Diagnosis Date   Atrial fibrillation (HCC)    a. initialy occurring in 2013  b. recurrence in 06/2020 in the setting of COVID-19 --> s/p DCCV in 07/2020   Chronic kidney disease, stage 3a (HCC)    Diabetes mellitus, type 2 (HCC)    GERD (gastroesophageal reflux disease)    Gout    Hypertension    Mild pulmonary hypertension (HCC)    Morbid obesity (HCC)    OSA (obstructive sleep apnea)    Paroxysmal atrial flutter (HCC)    Sleep apnea  Medicines Meds ordered this encounter  Medications   diphenhydrAMINE (BENADRYL) capsule 25 mg    I have reviewed the patients home medicines and have made adjustments as needed  Problem List / ED Course: Problem List Items Addressed This Visit   None Visit Diagnoses     Localized allergic contact urticaria    -  Primary                   Final Clinical Impression(s) / ED Diagnoses Final diagnoses:  Localized allergic contact urticaria    Rx / DC Orders ED Discharge Orders     None         Shon Baton, MD 07/20/22 [phone]

## 2022-07-20 NOTE — ED Notes (Signed)
ED Provider at bedside. 

## 2022-07-21 DIAGNOSIS — Z125 Encounter for screening for malignant neoplasm of prostate: Secondary | ICD-10-CM | POA: Diagnosis not present

## 2022-07-21 DIAGNOSIS — Z8042 Family history of malignant neoplasm of prostate: Secondary | ICD-10-CM | POA: Diagnosis not present

## 2022-07-24 DIAGNOSIS — I10 Essential (primary) hypertension: Secondary | ICD-10-CM | POA: Diagnosis not present

## 2022-07-24 DIAGNOSIS — E1165 Type 2 diabetes mellitus with hyperglycemia: Secondary | ICD-10-CM | POA: Diagnosis not present

## 2022-08-03 ENCOUNTER — Encounter (HOSPITAL_COMMUNITY)
Admission: RE | Admit: 2022-08-03 | Discharge: 2022-08-03 | Disposition: A | Discharge status: Home / Self Care | Payer: Medicare PPO | Source: Ambulatory Visit | Attending: Internal Medicine | Admitting: Internal Medicine

## 2022-08-03 ENCOUNTER — Encounter (HOSPITAL_COMMUNITY): Payer: Self-pay

## 2022-08-03 HISTORY — DX: Cardiac arrhythmia, unspecified: I49.9

## 2022-08-05 ENCOUNTER — Ambulatory Visit (HOSPITAL_COMMUNITY): Payer: Medicare PPO | Admitting: Certified Registered"

## 2022-08-05 ENCOUNTER — Ambulatory Visit (HOSPITAL_COMMUNITY)
Admission: RE | Admit: 2022-08-05 | Discharge: 2022-08-05 | Disposition: A | Discharge status: Home / Self Care | Payer: Medicare PPO | Attending: Internal Medicine | Admitting: Internal Medicine

## 2022-08-05 ENCOUNTER — Encounter (HOSPITAL_COMMUNITY): Payer: Self-pay

## 2022-08-05 ENCOUNTER — Encounter (HOSPITAL_COMMUNITY)
Admission: RE | Disposition: A | Discharge status: Home / Self Care | Payer: Self-pay | Source: Home / Self Care | Attending: Internal Medicine

## 2022-08-05 ENCOUNTER — Ambulatory Visit (HOSPITAL_BASED_OUTPATIENT_CLINIC_OR_DEPARTMENT_OTHER): Payer: Medicare PPO | Admitting: Certified Registered"

## 2022-08-05 DIAGNOSIS — G4733 Obstructive sleep apnea (adult) (pediatric): Secondary | ICD-10-CM | POA: Diagnosis not present

## 2022-08-05 DIAGNOSIS — N1831 Chronic kidney disease, stage 3a: Secondary | ICD-10-CM | POA: Insufficient documentation

## 2022-08-05 DIAGNOSIS — K625 Hemorrhage of anus and rectum: Secondary | ICD-10-CM | POA: Diagnosis not present

## 2022-08-05 DIAGNOSIS — K2970 Gastritis, unspecified, without bleeding: Secondary | ICD-10-CM

## 2022-08-05 DIAGNOSIS — K635 Polyp of colon: Secondary | ICD-10-CM

## 2022-08-05 DIAGNOSIS — E1122 Type 2 diabetes mellitus with diabetic chronic kidney disease: Secondary | ICD-10-CM | POA: Diagnosis not present

## 2022-08-05 DIAGNOSIS — I4891 Unspecified atrial fibrillation: Secondary | ICD-10-CM | POA: Insufficient documentation

## 2022-08-05 DIAGNOSIS — E119 Type 2 diabetes mellitus without complications: Secondary | ICD-10-CM | POA: Diagnosis not present

## 2022-08-05 DIAGNOSIS — I10 Essential (primary) hypertension: Secondary | ICD-10-CM | POA: Diagnosis not present

## 2022-08-05 DIAGNOSIS — R12 Heartburn: Secondary | ICD-10-CM | POA: Insufficient documentation

## 2022-08-05 DIAGNOSIS — K648 Other hemorrhoids: Secondary | ICD-10-CM | POA: Insufficient documentation

## 2022-08-05 DIAGNOSIS — D509 Iron deficiency anemia, unspecified: Secondary | ICD-10-CM | POA: Diagnosis not present

## 2022-08-05 DIAGNOSIS — K5730 Diverticulosis of large intestine without perforation or abscess without bleeding: Secondary | ICD-10-CM

## 2022-08-05 DIAGNOSIS — F17210 Nicotine dependence, cigarettes, uncomplicated: Secondary | ICD-10-CM | POA: Diagnosis not present

## 2022-08-05 DIAGNOSIS — I129 Hypertensive chronic kidney disease with stage 1 through stage 4 chronic kidney disease, or unspecified chronic kidney disease: Secondary | ICD-10-CM | POA: Diagnosis not present

## 2022-08-05 DIAGNOSIS — F17290 Nicotine dependence, other tobacco product, uncomplicated: Secondary | ICD-10-CM | POA: Diagnosis not present

## 2022-08-05 DIAGNOSIS — K219 Gastro-esophageal reflux disease without esophagitis: Secondary | ICD-10-CM | POA: Diagnosis not present

## 2022-08-05 DIAGNOSIS — D124 Benign neoplasm of descending colon: Secondary | ICD-10-CM | POA: Diagnosis not present

## 2022-08-05 DIAGNOSIS — E611 Iron deficiency: Secondary | ICD-10-CM | POA: Diagnosis not present

## 2022-08-05 HISTORY — PX: BIOPSY: SHX5522

## 2022-08-05 HISTORY — PX: COLONOSCOPY WITH PROPOFOL: SHX5780

## 2022-08-05 HISTORY — PX: POLYPECTOMY: SHX5525

## 2022-08-05 HISTORY — PX: ESOPHAGOGASTRODUODENOSCOPY (EGD) WITH PROPOFOL: SHX5813

## 2022-08-05 LAB — GLUCOSE, CAPILLARY: Glucose-Capillary: 133 mg/dL — ABNORMAL HIGH (ref 70–99)

## 2022-08-05 SURGERY — COLONOSCOPY WITH PROPOFOL
Anesthesia: General

## 2022-08-05 MED ORDER — KETAMINE HCL 10 MG/ML IJ SOLN
INTRAMUSCULAR | Status: DC | PRN
  Administered 2022-08-05: 20 mg via INTRAVENOUS

## 2022-08-05 MED ORDER — LIDOCAINE HCL (CARDIAC) PF 100 MG/5ML IV SOSY
PREFILLED_SYRINGE | INTRAVENOUS | Status: DC | PRN
  Administered 2022-08-05: 50 mg via INTRAVENOUS

## 2022-08-05 MED ORDER — PROPOFOL 500 MG/50ML IV EMUL
INTRAVENOUS | Status: DC | PRN
  Administered 2022-08-05: 150 ug/kg/min via INTRAVENOUS

## 2022-08-05 MED ORDER — LACTATED RINGERS IV SOLN: INTRAVENOUS | Status: DC

## 2022-08-05 MED ORDER — PHENYLEPHRINE 80 MCG/ML (10ML) SYRINGE FOR IV PUSH (FOR BLOOD PRESSURE SUPPORT)
PREFILLED_SYRINGE | INTRAVENOUS | Status: DC | PRN
  Administered 2022-08-05 (×2): 160 ug via INTRAVENOUS

## 2022-08-05 MED ORDER — PHENYLEPHRINE 80 MCG/ML (10ML) SYRINGE FOR IV PUSH (FOR BLOOD PRESSURE SUPPORT)
PREFILLED_SYRINGE | INTRAVENOUS | Status: AC
  Filled 2022-08-05: qty 10

## 2022-08-05 MED ORDER — KETAMINE HCL 50 MG/5ML IJ SOSY
PREFILLED_SYRINGE | INTRAMUSCULAR | Status: AC
  Filled 2022-08-05: qty 5

## 2022-08-05 MED ORDER — PROPOFOL 10 MG/ML IV BOLUS
INTRAVENOUS | Status: DC | PRN
  Administered 2022-08-05: 100 mg via INTRAVENOUS

## 2022-08-05 NOTE — Op Note (Signed)
[facility] Patient Name: Kevin Goodman Procedure Date: 08/05/2022 7:07 AM MRN: [id number] Date of Birth: 17-Mar-1950 Attending MD: Hennie Duos. Maple Mirza, [phone] CSN: [id number] Age: 72 Admit Type: Outpatient Procedure:                Upper GI endoscopy Indications:              Iron deficiency anemia, Heartburn Providers:                Hennie Duos. Marletta Lor, DO, Sheran Fava, [place], Dyann Ruddle Referring MD:              Medicines:                See the Anesthesia note for documentation of the                            administered medications Complications:            No immediate complications. Estimated Blood Loss:     Estimated blood loss was minimal. Procedure:                Pre-Anesthesia Assessment:                           - The anesthesia plan was to use monitored                            anesthesia care (MAC).                           After obtaining informed consent, the endoscope was                            passed under direct vision. Throughout the                            procedure, the patient's blood pressure, pulse, and                            oxygen saturations were monitored continuously. The                            [id number]-[id number] ([id number]) scope was introduced through the                            mouth, and advanced to the second part of duodenum.                            The upper GI endoscopy was accomplished without                            difficulty. The patient tolerated the procedure                            well. Scope In:  7:36:45 AM Scope Out: 7:39:36 AM Total Procedure Duration: 0 hours 2 minutes 51 seconds  Findings:      The Z-line was regular and was found 40 cm from the incisors.      Patchy mild inflammation characterized by erythema was found in the       gastric body and in the gastric antrum. Biopsies were taken with a cold       forceps for Helicobacter pylori testing.       The duodenal bulb, first portion of the duodenum and second portion of       the duodenum were normal. Impression:               - Z-line regular, 40 cm from the incisors.                           - Gastritis. Biopsied.                           - Normal duodenal bulb, first portion of the                            duodenum and second portion of the duodenum. Moderate Sedation:      Per Anesthesia Care Recommendation:           - Patient has a contact number available for                            emergencies. The signs and symptoms of potential                            delayed complications were discussed with the                            patient. Return to normal activities tomorrow.                            Written discharge instructions were provided to the                            patient.                           - Resume previous diet.                           - Continue present medications.                           - Await pathology results.                           - Return to GI clinic in 3 months. Procedure Code(s):        --- Professional ---                           [phone], Esophagogastroduodenoscopy, flexible,  transoral; with biopsy, single or multiple Diagnosis Code(s):        --- Professional ---                           K29.70, Gastritis, unspecified, without bleeding                           D50.9, Iron deficiency anemia, unspecified                           R12, Heartburn CPT copyright 2022 American Medical Association. All rights reserved. The codes documented in this report are preliminary and upon coder review may  be revised to meet current compliance requirements. Hennie Duos. Marletta Lor, DO Hennie Duos. Marletta Lor, DO 08/05/2022 7:43:07 AM This report has been signed electronically. Number of Addenda: 0

## 2022-08-05 NOTE — H&P (Signed)
Primary Care Physician:  Benetta Spar, MD Primary Gastroenterologist:  Dr. Marletta Lor  Pre-Procedure History & Physical: HPI:  Kevin Goodman is a 72 y.o. male is here for an EGD and colonoscopy to be performed for iron deficiency anemia and rectal bleeding,.   Past Medical History:  Diagnosis Date   Atrial fibrillation (HCC)    a. initialy occurring in 2013  b. recurrence in 06/2020 in the setting of COVID-19 --> s/p DCCV in 07/2020   Chronic kidney disease, stage 3a (HCC)    Diabetes mellitus, type 2 (HCC)    Dysrhythmia    GERD (gastroesophageal reflux disease)    Gout    Hypertension    Mild pulmonary hypertension (HCC)    Morbid obesity (HCC)    OSA (obstructive sleep apnea)    Paroxysmal atrial flutter (HCC)    Sleep apnea     Past Surgical History:  Procedure Laterality Date   BALLOON DILATION N/A 12/25/2020   Procedure: BALLOON DILATION;  Surgeon: Lanelle Bal, DO;  Location: [facility];  Service: Endoscopy;  Laterality: N/A;   BIOPSY  12/25/2020   Procedure: BIOPSY;  Surgeon: Lanelle Bal, DO;  Location: [facility];  Service: Endoscopy;;   CARDIOVERSION N/A 07/09/2020   Procedure: CARDIOVERSION;  Surgeon: Antoine Poche, MD;  Location: [facility] ORS;  Service: Endoscopy;  Laterality: N/A;   COLONOSCOPY N/A 12/08/2016   sigmoid and descending colon diverticula, four semi-pedunculated polyps in descending and ascendign colon, few polyps in descending and ascending colon (tubular adenomas). 3 year surveillance.    COLONOSCOPY WITH PROPOFOL N/A 12/25/2020   Surgeon: Earnest Bailey K, DO; Nonbleeding internal hemorrhoids, pancolonic diverticulosis, 2 mm tubular adenoma.  Recommended repeat in 5 years.   ESOPHAGOGASTRODUODENOSCOPY N/A 12/08/2016   normal esophagus s/p empiric dilation, small hiatal hernia.    ESOPHAGOGASTRODUODENOSCOPY (EGD) WITH PROPOFOL N/A 12/25/2020   Surgeon: Lanelle Bal, DO;  Normal esophagus s/p empiric dilation, gastritis  biopsied, normal examined duodenum.  Pathology with slight chronic inflammation, no H. pylori.   FINGER SURGERY     left ring   INCISION AND DRAINAGE OF WOUND Left 04/20/2016   Procedure: LEFT THIGH DEBRIDEMENT;  Surgeon: Rodman Pickle, MD;  Location: [facility] OR;  Service: General;  Laterality: Left;   MALONEY DILATION N/A 12/08/2016   Procedure: Alvy Beal;  Surgeon: Corbin Ade, MD;  Location: [facility];  Service: Endoscopy;  Laterality: N/A;   POLYPECTOMY  12/08/2016   Procedure: POLYPECTOMY;  Surgeon: Corbin Ade, MD;  Location: [facility];  Service: Endoscopy;;  colon   POLYPECTOMY  12/25/2020   Procedure: POLYPECTOMY;  Surgeon: Lanelle Bal, DO;  Location: [facility];  Service: Endoscopy;;    Prior to Admission medications   Medication Sig Start Date End Date Taking? Authorizing Provider  acetaminophen (TYLENOL) 500 MG tablet Take 1,000 mg by mouth every 6 (six) hours as needed for moderate pain.   Yes [provider]  apixaban (ELIQUIS) 5 MG TABS tablet Take 1 tablet (5 mg total) by mouth 2 (two) times daily. 06/19/21  Yes Johnson, Clanford L, MD  atorvastatin (LIPITOR) 20 MG tablet Take 20 mg by mouth daily.   Yes [provider]  dapagliflozin propanediol (FARXIGA) 5 MG TABS tablet Take 5 mg by mouth daily. 04/30/22 04/30/23 Yes [provider]  diltiazem (TIAZAC) 360 MG 24 hr capsule Take 360 mg by mouth daily.   Yes [provider]  ferrous sulfate 325 (65 FE)  MG EC tablet Take 325 mg by mouth daily. 04/30/22 04/30/23 Yes [provider]  furosemide (LASIX) 40 MG tablet Take 40 mg by mouth 2 (two) times daily.   Yes [provider]  hydrALAZINE (APRESOLINE) 100 MG tablet Take 100 mg by mouth 2 (two) times daily.   Yes [provider]  LANTUS SOLOSTAR 100 UNIT/ML Solostar Pen Inject 40 Units into the skin at bedtime. 05/18/22  Yes [provider]  losartan (COZAAR) 100 MG tablet Take 1  tablet (100 mg total) by mouth daily. 10/27/20 12/08/22 Yes Strader, Lennart Pall, PA-C  metoprolol tartrate (LOPRESSOR) 50 MG tablet Take 1 tablet (50 mg total) by mouth 2 (two) times daily. 06/19/21  Yes Johnson, Clanford L, MD  oxymetazoline (AFRIN) 0.05 % nasal spray Place 1 spray into both nostrils 2 (two) times daily as needed for congestion.   Yes [provider]  pantoprazole (PROTONIX) 40 MG tablet Take 1 tablet (40 mg total) by mouth 2 (two) times daily before a meal. 30 minutes before breakfast 12/25/20 12/10/22 Yes Nicholus Chandran K, DO  TRADJENTA 5 MG TABS tablet Take 5 mg by mouth daily.   Yes [provider]  diltiazem (CARDIZEM CD) 240 MG 24 hr capsule Take 1 capsule (240 mg total) by mouth daily. Patient not taking: Reported on 07/28/2022 12/13/21 07/28/22  Arrien, York Ram, MD    Allergies as of 06/21/2022   (No Known Allergies)    Family History  Problem Relation Age of Onset   Heart failure Mother    Hypertension Mother    Hypertension Father    Stroke Father    Prostate cancer Father    Stroke Brother    Prostate cancer Brother    Colon cancer Neg Hx    Colon polyps Neg Hx     Social History   Socioeconomic History   Marital status: Widowed    Spouse name: Not on file   Number of children: Not on file   Years of education: Not on file   Highest education level: Not on file  Occupational History   Occupation: Security    Employer: [employer]  Tobacco Use   Smoking status: Some Days    Types: Cigars    Last attempt to quit: 07/08/2018    Years since quitting: 4.0   Smokeless tobacco: Never   Tobacco comments:    smoked 2-3 cigars daily  Vaping Use   Vaping Use: Never used  Substance and Sexual Activity   Alcohol use: Not Currently    Comment: occasional shot of liquour every now and then    Drug use: No   Sexual activity: Yes  Other Topics Concern   Not on file  Social History Narrative   Not on file   Social  Determinants of Health   Financial Resource Strain: Not on file  Food Insecurity: Food Insecurity Present (05/21/2022)   Hunger Vital Sign    Worried About Running Out of Food in the Last Year: Sometimes true    Ran Out of Food in the Last Year: Never true  Transportation Needs: No Transportation Needs (05/21/2022)   PRAPARE - Administrator, Civil Service (Medical): No    Lack of Transportation (Non-Medical): No  Physical Activity: Not on file  Stress: Not on file  Social Connections: Not on file  Intimate Partner Violence: Not At Risk (05/21/2022)   Humiliation, Afraid, Rape, and Kick questionnaire    Fear of Current or Ex-Partner: No  Emotionally Abused: No    Physically Abused: No    Sexually Abused: No    Review of Systems: General: Negative for fever, chills, fatigue, weakness. Eyes: Negative for vision changes.  ENT: Negative for hoarseness, difficulty swallowing , nasal congestion. CV: Negative for chest pain, angina, palpitations, dyspnea on exertion, peripheral edema.  Respiratory: Negative for dyspnea at rest, dyspnea on exertion, cough, sputum, wheezing.  GI: See history of present illness. GU:  Negative for dysuria, hematuria, urinary incontinence, urinary frequency, nocturnal urination.  MS: Negative for joint pain, low back pain.  Derm: Negative for rash or itching.  Neuro: Negative for weakness, abnormal sensation, seizure, frequent headaches, memory loss, confusion.  Psych: Negative for anxiety, depression Endo: Negative for unusual weight change.  Heme: Negative for bruising or bleeding. Allergy: Negative for rash or hives.  Physical Exam: Vital signs in last 24 hours: Temp:  [98.4 F (36.9 C)] 98.4 F (36.9 C) (05/30 0628) Pulse Rate:  [88] 88 (05/30 0628) Resp:  [20] 20 (05/30 0628) BP: (132-153)/(79-108) 132/79 (05/30 0722) SpO2:  [97 %] 97 % (05/30 0628)   General:   Alert,  Well-developed, well-nourished, pleasant and cooperative in  NAD Head:  Normocephalic and atraumatic. Eyes:  Sclera clear, no icterus.   Conjunctiva pink. Ears:  Normal auditory acuity. Nose:  No deformity, discharge,  or lesions. Msk:  Symmetrical without gross deformities. Normal posture. Extremities:  Without clubbing or edema. Neurologic:  Alert and  oriented x4;  grossly normal neurologically. Skin:  Intact without significant lesions or rashes. Psych:  Alert and cooperative. Normal mood and affect.   Impression/Plan: Kevin Goodman is here for an EGD and colonoscopy to be performed for iron deficiency anemia and rectal bleeding,.   Risks, benefits, limitations, imponderables and alternatives regarding EGD have been reviewed with the patient. Questions have been answered. All parties agreeable.

## 2022-08-05 NOTE — Op Note (Signed)
[facility] Patient Name: Kevin Goodman Procedure Date: 08/05/2022 7:11 AM MRN: [id number] Date of Birth: Oct 17, 1950 Attending MD: Hennie Duos. Maple Mirza, [id number] CSN: [id number] Age: 72 Admit Type: Outpatient Procedure:                Colonoscopy Indications:              Iron deficiency anemia Providers:                Hennie Duos. Marletta Lor, DO, Sheran Fava, [place], Dyann Ruddle Referring MD:              Medicines:                See the Anesthesia note for documentation of the                            administered medications Complications:            No immediate complications. Estimated Blood Loss:     Estimated blood loss was minimal. Procedure:                Pre-Anesthesia Assessment:                           - The anesthesia plan was to use monitored                            anesthesia care (MAC).                           After obtaining informed consent, the colonoscope                            was passed under direct vision. Throughout the                            procedure, the patient's blood pressure, pulse, and                            oxygen saturations were monitored continuously. The                            [id number]-HQ190L ([id number] scope was introduced through                            the anus and advanced to the the terminal ileum,                            with identification of the appendiceal orifice and                            IC valve. The colonoscopy was performed without                            difficulty. The patient tolerated the procedure  well. The quality of the bowel preparation was                            evaluated using the BBPS [facility] Bowel Preparation                            Scale) with scores of: Right Colon = 3, Transverse                            Colon = 3 and Left Colon = 3 (entire mucosa seen                            well with no residual  staining, small fragments of                            stool or opaque liquid). The total BBPS score                            equals 9. Scope In: 7:43:49 AM Scope Out: 7:54:50 AM Scope Withdrawal Time: 0 hours 9 minutes 33 seconds  Total Procedure Duration: 0 hours 11 minutes 1 second  Findings:      Non-bleeding internal hemorrhoids were found during endoscopy.      Scattered small-mouthed diverticula were found in the entire colon.      A 7 mm polyp was found in the descending colon. The polyp was sessile.       The polyp was removed with a cold snare. Resection and retrieval were       complete.      The terminal ileum appeared normal.      The exam was otherwise without abnormality. Impression:               - Non-bleeding internal hemorrhoids.                           - Diverticulosis in the entire examined colon.                           - One 7 mm polyp in the descending colon, removed                            with a cold snare. Resected and retrieved.                           - The examined portion of the ileum was normal.                           - The examination was otherwise normal. Moderate Sedation:      Per Anesthesia Care Recommendation:           - Patient has a contact number available for                            emergencies. The signs and symptoms of potential  delayed complications were discussed with the                            patient. Return to normal activities tomorrow.                            Written discharge instructions were provided to the                            patient.                           - Resume previous diet.                           - Continue present medications.                           - Await pathology results.                           - Repeat colonoscopy in 5 years for surveillance.                           - Return to GI clinic in 3 months. Procedure Code(s):        --- Professional  ---                           [id number], Colonoscopy, flexible; with removal of                            tumor(s), polyp(s), or other lesion(s) by snare                            technique Diagnosis Code(s):        --- Professional ---                           K64.8, Other hemorrhoids                           D12.4, Benign neoplasm of descending colon                           D50.9, Iron deficiency anemia, unspecified                           K57.30, Diverticulosis of large intestine without                            perforation or abscess without bleeding CPT copyright 2022 American Medical Association. All rights reserved. The codes documented in this report are preliminary and upon coder review may  be revised to meet current compliance requirements. Hennie Duos. Marletta Lor, DO Hennie Duos. Marletta Lor, DO 08/05/2022 7:59:48 AM This report has been signed electronically. Number of Addenda: 0

## 2022-08-05 NOTE — Anesthesia Procedure Notes (Signed)
Date/Time: 08/05/2022 7:39 AM  Performed by: Julian Reil, CRNAPre-anesthesia Checklist: Patient identified, Emergency Drugs available, Suction available and Patient being monitored Patient Re-evaluated:Patient Re-evaluated prior to induction Oxygen Delivery Method: Simple face mask Induction Type: IV induction Placement Confirmation: positive ETCO2 Comments: POM Oxygen face mask used due to OSA, large face, head and short neck.

## 2022-08-05 NOTE — Anesthesia Preprocedure Evaluation (Addendum)
Anesthesia Evaluation  Patient identified by MRN, date of birth, ID band Patient awake    Reviewed: Allergy & Precautions, H&P , NPO status , Patient's Chart, lab work & pertinent test results, reviewed documented beta blocker date and time   Airway Mallampati: II  TM Distance: >3 FB Neck ROM: full    Dental  (+) Chipped, Dental Advisory Given,    Pulmonary sleep apnea , pneumonia, Current Smoker and Patient abstained from smoking.   Pulmonary exam normal breath sounds clear to auscultation       Cardiovascular Exercise Tolerance: Good hypertension, + dysrhythmias Atrial Fibrillation  Rhythm:regular Rate:Normal     Neuro/Psych negative neurological ROS  negative psych ROS   GI/Hepatic negative GI ROS, Neg liver ROS,GERD  ,,  Endo/Other  diabetes    Renal/GU Renal diseasenegative Renal ROS  negative genitourinary   Musculoskeletal   Abdominal   Peds  Hematology negative hematology ROS (+)   Anesthesia Other Findings   Reproductive/Obstetrics negative OB ROS                             Anesthesia Physical Anesthesia Plan  ASA: 3  Anesthesia Plan: General   Post-op Pain Management:    Induction:   PONV Risk Score and Plan: Propofol infusion  Airway Management Planned:   Additional Equipment:   Intra-op Plan:   Post-operative Plan:   Informed Consent: I have reviewed the patients History and Physical, chart, labs and discussed the procedure including the risks, benefits and alternatives for the proposed anesthesia with the patient or authorized representative who has indicated his/her understanding and acceptance.     Dental Advisory Given  Plan Discussed with: CRNA  Anesthesia Plan Comments:        Anesthesia Quick Evaluation

## 2022-08-05 NOTE — Discharge Instructions (Addendum)
EGD Discharge instructions Please read the instructions outlined below and refer to this sheet in the next few weeks. These discharge instructions provide you with general information on caring for yourself after you leave the hospital. Your doctor may also give you specific instructions. While your treatment has been planned according to the most current medical practices available, unavoidable complications occasionally occur. If you have any problems or questions after discharge, please call your doctor. ACTIVITY You may resume your regular activity but move at a slower pace for the next 24 hours.  Take frequent rest periods for the next 24 hours.  Walking will help expel (get rid of) the air and reduce the bloated feeling in your abdomen.  No driving for 24 hours (because of the anesthesia (medicine) used during the test).  You may shower.  Do not sign any important legal documents or operate any machinery for 24 hours (because of the anesthesia used during the test).  NUTRITION Drink plenty of fluids.  You may resume your normal diet.  Begin with a light meal and progress to your normal diet.  Avoid alcoholic beverages for 24 hours or as instructed by your caregiver.  MEDICATIONS You may resume your normal medications unless your caregiver tells you otherwise.  WHAT YOU CAN EXPECT TODAY You may experience abdominal discomfort such as a feeling of fullness or "gas" pains.  FOLLOW-UP Your doctor will discuss the results of your test with you.  SEEK IMMEDIATE MEDICAL ATTENTION IF ANY OF THE FOLLOWING OCCUR: Excessive nausea (feeling sick to your stomach) and/or vomiting.  Severe abdominal pain and distention (swelling).  Trouble swallowing.  Temperature over 101 F (37.8 C).  Rectal bleeding or vomiting of blood.    Colonoscopy Discharge Instructions  Read the instructions outlined below and refer to this sheet in the next few weeks. These discharge instructions provide you with  general information on caring for yourself after you leave the hospital. Your doctor may also give you specific instructions. While your treatment has been planned according to the most current medical practices available, unavoidable complications occasionally occur.   ACTIVITY You may resume your regular activity, but move at a slower pace for the next 24 hours.  Take frequent rest periods for the next 24 hours.  Walking will help get rid of the air and reduce the bloated feeling in your belly (abdomen).  No driving for 24 hours (because of the medicine (anesthesia) used during the test).   Do not sign any important legal documents or operate any machinery for 24 hours (because of the anesthesia used during the test).  NUTRITION Drink plenty of fluids.  You may resume your normal diet as instructed by your doctor.  Begin with a light meal and progress to your normal diet. Heavy or fried foods are harder to digest and may make you feel sick to your stomach (nauseated).  Avoid alcoholic beverages for 24 hours or as instructed.  MEDICATIONS You may resume your normal medications unless your doctor tells you otherwise.  WHAT YOU CAN EXPECT TODAY Some feelings of bloating in the abdomen.  Passage of more gas than usual.  Spotting of blood in your stool or on the toilet paper.  IF YOU HAD POLYPS REMOVED DURING THE COLONOSCOPY: No aspirin products for 7 days or as instructed.  No alcohol for 7 days or as instructed.  Eat a soft diet for the next 24 hours.  FINDING OUT THE RESULTS OF YOUR TEST Not all test results are available  during your visit. If your test results are not back during the visit, make an appointment with your caregiver to find out the results. Do not assume everything is normal if you have not heard from your caregiver or the medical facility. It is important for you to follow up on all of your test results.  SEEK IMMEDIATE MEDICAL ATTENTION IF: You have more than a spotting of  blood in your stool.  Your belly is swollen (abdominal distention).  You are nauseated or vomiting.  You have a temperature over 101.  You have abdominal pain or discomfort that is severe or gets worse throughout the day.   Your EGD revealed mild amount inflammation in your stomach.  I took biopsies of this to rule out infection with a bacteria called H. pylori.  Await pathology results, my office will contact you.  Esophagus and small bowel appeared normal.  Continue on pantoprazole.  Your colonoscopy revealed 1 polyp(s) which I removed successfully. Await pathology results, my office will contact you. I recommend repeating colonoscopy in 5 years for surveillance purposes.   You also have diverticulosis and internal hemorrhoids. I would recommend increasing fiber in your diet or adding OTC Benefiber/Metamucil. Be sure to drink at least 4 to 6 glasses of water daily. Follow-up with GI in 3 months   I hope you have a great rest of your week!  Hennie Duos. Marletta Lor, D.O. Gastroenterology and Hepatology [facility]

## 2022-08-05 NOTE — Transfer of Care (Signed)
Immediate Anesthesia Transfer of Care Note  Patient: Kevin Goodman  Procedure(s) Performed: COLONOSCOPY WITH PROPOFOL ESOPHAGOGASTRODUODENOSCOPY (EGD) WITH PROPOFOL BIOPSY POLYPECTOMY  Patient Location: Short Stay  Anesthesia Type:General  Level of Consciousness: drowsy  Airway & Oxygen Therapy: Patient Spontanous Breathing and Patient connected to face mask oxygen  Post-op Assessment: Report given to RN and Post -op Vital signs reviewed and stable  Post vital signs: Reviewed and stable  Last Vitals:  Vitals Value Taken Time  BP    Temp    Pulse    Resp    SpO2      Last Pain:  Vitals:   08/05/22 0732  TempSrc:   PainSc: 0-No pain      Patients Stated Pain Goal: 5 (08/05/22 1610)  Complications: No notable events documented.

## 2022-08-06 LAB — SURGICAL PATHOLOGY

## 2022-08-10 ENCOUNTER — Encounter (HOSPITAL_COMMUNITY): Payer: Self-pay | Admitting: Internal Medicine

## 2022-08-11 NOTE — Anesthesia Postprocedure Evaluation (Signed)
Anesthesia Post Note  Patient: Kevin Goodman  Procedure(s) Performed: COLONOSCOPY WITH PROPOFOL ESOPHAGOGASTRODUODENOSCOPY (EGD) WITH PROPOFOL BIOPSY POLYPECTOMY  Patient location during evaluation: Phase II Anesthesia Type: General Level of consciousness: awake Pain management: pain level controlled Vital Signs Assessment: post-procedure vital signs reviewed and stable Respiratory status: spontaneous breathing and respiratory function stable Cardiovascular status: blood pressure returned to baseline and stable Postop Assessment: no headache and no apparent nausea or vomiting Anesthetic complications: no Comments: Late entry   No notable events documented.   Last Vitals:  Vitals:   08/05/22 0722 08/05/22 0758  BP: 132/79 (!) 106/57  Pulse:  68  Resp:  (!) 21  Temp:  36.7 C  SpO2:  98%    Last Pain:  Vitals:   08/06/22 1512  TempSrc:   PainSc: 0-No pain                 Windell Norfolk

## 2022-08-24 DIAGNOSIS — E1165 Type 2 diabetes mellitus with hyperglycemia: Secondary | ICD-10-CM | POA: Diagnosis not present

## 2022-08-24 DIAGNOSIS — I10 Essential (primary) hypertension: Secondary | ICD-10-CM | POA: Diagnosis not present

## 2022-08-28 DIAGNOSIS — N179 Acute kidney failure, unspecified: Secondary | ICD-10-CM | POA: Diagnosis not present

## 2022-08-28 DIAGNOSIS — I4819 Other persistent atrial fibrillation: Secondary | ICD-10-CM | POA: Diagnosis not present

## 2022-08-28 DIAGNOSIS — M79661 Pain in right lower leg: Secondary | ICD-10-CM | POA: Diagnosis not present

## 2022-08-28 DIAGNOSIS — I959 Hypotension, unspecified: Secondary | ICD-10-CM | POA: Diagnosis not present

## 2022-08-28 DIAGNOSIS — E872 Acidosis, unspecified: Secondary | ICD-10-CM | POA: Diagnosis not present

## 2022-08-28 DIAGNOSIS — R5381 Other malaise: Secondary | ICD-10-CM | POA: Diagnosis not present

## 2022-08-28 DIAGNOSIS — A419 Sepsis, unspecified organism: Secondary | ICD-10-CM | POA: Diagnosis not present

## 2022-08-28 DIAGNOSIS — I161 Hypertensive emergency: Secondary | ICD-10-CM | POA: Diagnosis not present

## 2022-08-28 DIAGNOSIS — M79662 Pain in left lower leg: Secondary | ICD-10-CM | POA: Diagnosis not present

## 2022-08-28 DIAGNOSIS — I482 Chronic atrial fibrillation, unspecified: Secondary | ICD-10-CM | POA: Diagnosis not present

## 2022-08-28 DIAGNOSIS — R000 Tachycardia, unspecified: Secondary | ICD-10-CM | POA: Diagnosis not present

## 2022-08-28 DIAGNOSIS — G4734 Idiopathic sleep related nonobstructive alveolar hypoventilation: Secondary | ICD-10-CM | POA: Diagnosis not present

## 2022-08-28 DIAGNOSIS — I480 Paroxysmal atrial fibrillation: Secondary | ICD-10-CM | POA: Diagnosis not present

## 2022-08-28 DIAGNOSIS — R29818 Other symptoms and signs involving the nervous system: Secondary | ICD-10-CM | POA: Diagnosis not present

## 2022-08-28 DIAGNOSIS — N189 Chronic kidney disease, unspecified: Secondary | ICD-10-CM | POA: Diagnosis not present

## 2022-08-28 DIAGNOSIS — L03116 Cellulitis of left lower limb: Secondary | ICD-10-CM | POA: Diagnosis not present

## 2022-08-28 DIAGNOSIS — R531 Weakness: Secondary | ICD-10-CM | POA: Diagnosis not present

## 2022-08-28 DIAGNOSIS — F518 Other sleep disorders not due to a substance or known physiological condition: Secondary | ICD-10-CM | POA: Diagnosis not present

## 2022-08-28 DIAGNOSIS — R0902 Hypoxemia: Secondary | ICD-10-CM | POA: Diagnosis not present

## 2022-08-29 DIAGNOSIS — R000 Tachycardia, unspecified: Secondary | ICD-10-CM | POA: Diagnosis not present

## 2022-09-07 DIAGNOSIS — M1A0710 Idiopathic chronic gout, right ankle and foot, without tophus (tophi): Secondary | ICD-10-CM | POA: Diagnosis not present

## 2022-09-07 DIAGNOSIS — I10 Essential (primary) hypertension: Secondary | ICD-10-CM | POA: Diagnosis not present

## 2022-09-07 DIAGNOSIS — E1165 Type 2 diabetes mellitus with hyperglycemia: Secondary | ICD-10-CM | POA: Diagnosis not present

## 2022-09-24 ENCOUNTER — Inpatient Hospital Stay: Payer: Medicare PPO | Attending: Hematology

## 2022-09-24 DIAGNOSIS — F17290 Nicotine dependence, other tobacco product, uncomplicated: Secondary | ICD-10-CM | POA: Diagnosis not present

## 2022-09-24 DIAGNOSIS — Z86010 Personal history of colonic polyps: Secondary | ICD-10-CM | POA: Diagnosis not present

## 2022-09-24 DIAGNOSIS — K219 Gastro-esophageal reflux disease without esophagitis: Secondary | ICD-10-CM | POA: Diagnosis not present

## 2022-09-24 DIAGNOSIS — Z8616 Personal history of COVID-19: Secondary | ICD-10-CM | POA: Diagnosis not present

## 2022-09-24 DIAGNOSIS — Z79899 Other long term (current) drug therapy: Secondary | ICD-10-CM | POA: Diagnosis not present

## 2022-09-24 DIAGNOSIS — N1832 Chronic kidney disease, stage 3b: Secondary | ICD-10-CM | POA: Insufficient documentation

## 2022-09-24 DIAGNOSIS — I4891 Unspecified atrial fibrillation: Secondary | ICD-10-CM | POA: Insufficient documentation

## 2022-09-24 DIAGNOSIS — Z7901 Long term (current) use of anticoagulants: Secondary | ICD-10-CM | POA: Diagnosis not present

## 2022-09-24 DIAGNOSIS — E1122 Type 2 diabetes mellitus with diabetic chronic kidney disease: Secondary | ICD-10-CM | POA: Diagnosis not present

## 2022-09-24 DIAGNOSIS — K635 Polyp of colon: Secondary | ICD-10-CM | POA: Insufficient documentation

## 2022-09-24 DIAGNOSIS — R0602 Shortness of breath: Secondary | ICD-10-CM | POA: Diagnosis not present

## 2022-09-24 DIAGNOSIS — Z8719 Personal history of other diseases of the digestive system: Secondary | ICD-10-CM | POA: Diagnosis not present

## 2022-09-24 DIAGNOSIS — K5900 Constipation, unspecified: Secondary | ICD-10-CM | POA: Insufficient documentation

## 2022-09-24 DIAGNOSIS — I129 Hypertensive chronic kidney disease with stage 1 through stage 4 chronic kidney disease, or unspecified chronic kidney disease: Secondary | ICD-10-CM | POA: Insufficient documentation

## 2022-09-24 DIAGNOSIS — Z8249 Family history of ischemic heart disease and other diseases of the circulatory system: Secondary | ICD-10-CM | POA: Diagnosis not present

## 2022-09-24 DIAGNOSIS — Z823 Family history of stroke: Secondary | ICD-10-CM | POA: Diagnosis not present

## 2022-09-24 DIAGNOSIS — Z8042 Family history of malignant neoplasm of prostate: Secondary | ICD-10-CM | POA: Insufficient documentation

## 2022-09-24 DIAGNOSIS — D472 Monoclonal gammopathy: Secondary | ICD-10-CM | POA: Diagnosis not present

## 2022-09-24 LAB — IRON AND TIBC
Iron: 46 ug/dL (ref 45–182)
Saturation Ratios: 13 % — ABNORMAL LOW (ref 17.9–39.5)
TIBC: 359 ug/dL (ref 250–450)
UIBC: 313 ug/dL

## 2022-09-24 LAB — CBC WITH DIFFERENTIAL/PLATELET
Abs Immature Granulocytes: 0.07 10*3/uL (ref 0.00–0.07)
Basophils Absolute: 0.1 10*3/uL (ref 0.0–0.1)
Basophils Relative: 1 %
Eosinophils Absolute: 0.3 10*3/uL (ref 0.0–0.5)
Eosinophils Relative: 2 %
HCT: 48.4 % (ref 39.0–52.0)
Hemoglobin: 15.8 g/dL (ref 13.0–17.0)
Immature Granulocytes: 1 %
Lymphocytes Relative: 35 %
Lymphs Abs: 4.7 10*3/uL — ABNORMAL HIGH (ref 0.7–4.0)
MCH: 30.2 pg (ref 26.0–34.0)
MCHC: 32.6 g/dL (ref 30.0–36.0)
MCV: 92.4 fL (ref 80.0–100.0)
Monocytes Absolute: 0.9 10*3/uL (ref 0.1–1.0)
Monocytes Relative: 7 %
Neutro Abs: 7.5 10*3/uL (ref 1.7–7.7)
Neutrophils Relative %: 54 %
Platelets: 244 10*3/uL (ref 150–400)
RBC: 5.24 MIL/uL (ref 4.22–5.81)
RDW: 14.8 % (ref 11.5–15.5)
WBC: 13.6 10*3/uL — ABNORMAL HIGH (ref 4.0–10.5)
nRBC: 0 % (ref 0.0–0.2)

## 2022-09-24 LAB — FERRITIN: Ferritin: 58 ng/mL (ref 24–336)

## 2022-09-28 DIAGNOSIS — E611 Iron deficiency: Secondary | ICD-10-CM | POA: Diagnosis not present

## 2022-09-28 DIAGNOSIS — E1122 Type 2 diabetes mellitus with diabetic chronic kidney disease: Secondary | ICD-10-CM | POA: Diagnosis not present

## 2022-09-28 DIAGNOSIS — I129 Hypertensive chronic kidney disease with stage 1 through stage 4 chronic kidney disease, or unspecified chronic kidney disease: Secondary | ICD-10-CM | POA: Diagnosis not present

## 2022-09-28 DIAGNOSIS — E1129 Type 2 diabetes mellitus with other diabetic kidney complication: Secondary | ICD-10-CM | POA: Diagnosis not present

## 2022-09-28 DIAGNOSIS — N189 Chronic kidney disease, unspecified: Secondary | ICD-10-CM | POA: Diagnosis not present

## 2022-09-28 DIAGNOSIS — R809 Proteinuria, unspecified: Secondary | ICD-10-CM | POA: Diagnosis not present

## 2022-09-28 LAB — KAPPA/LAMBDA LIGHT CHAINS
Kappa free light chain: 62.3 mg/L — ABNORMAL HIGH (ref 3.3–19.4)
Kappa, lambda light chain ratio: 2.63 — ABNORMAL HIGH (ref 0.26–1.65)
Lambda free light chains: 23.7 mg/L (ref 5.7–26.3)

## 2022-09-29 LAB — PROTEIN ELECTROPHORESIS, SERUM
A/G Ratio: 0.9 (ref 0.7–1.7)
Albumin ELP: 3.3 g/dL (ref 2.9–4.4)
Alpha-1-Globulin: 0.3 g/dL (ref 0.0–0.4)
Alpha-2-Globulin: 0.8 g/dL (ref 0.4–1.0)
Beta Globulin: 1.4 g/dL — ABNORMAL HIGH (ref 0.7–1.3)
Gamma Globulin: 1.4 g/dL (ref 0.4–1.8)
Globulin, Total: 3.8 g/dL (ref 2.2–3.9)
M-Spike, %: 0.5 g/dL — ABNORMAL HIGH
Total Protein ELP: 7.1 g/dL (ref 6.0–8.5)

## 2022-09-29 NOTE — Progress Notes (Signed)
[facility] [address] [place] [address]   Clinic Day:  09/30/2022  Referring physician: Benetta Spar*  Patient Care Team: Benetta Spar, MD as PCP - General (Internal Medicine) Wyline Mood Dorothe Pea, MD as PCP - Cardiology (Cardiology) West Bali, MD (Inactive) as Consulting Physician (Gastroenterology) Lanelle Bal, DO as Consulting Physician (Internal Medicine) Doreatha Massed, MD as Medical Oncologist (Hematology)   ASSESSMENT & PLAN:   Assessment: 1.  IgG kappa MGUS: - Patient seen at the request of Dr. Wolfgang Phoenix - 04/15/2022: Kappa light chains 50.6, lambda light chains 17.4, ratio 2.91.  SPEP with M spike 0.1 g.  IFE-IgG kappa.  24-hour urine protein 257 mg.  Urine IFE: Faint IgG kappa immunoglobulin detected.  Hb-15.1, creatinine-1.73, calcium-10.0, albumin 4.1.  Ferritin was low at 17. - LDH normal.  Beta-2 microglobulin 2.7.  2.  Social/family history: - He lives at home with his stepson.  Independent of ADLs and IADLs.  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  Used to smoke 2 to 3 cigars/day starting in his 63s.  Now smokes cigars occasionally 2-3 times a year. - Brother and father had prostate cancers.  Plan: 1.  IgG kappa MGUS: - Denies any new onset pains.  No infections. - Labs from 09/24/2022: M spike 0.5 g, up from 0.1 g.  Kappa light chain 62.3, ratio 2.63 more or less stable. - Recommend x-ray of the thoracic spine as he had questionable lucency noted in the midportion of the posterior aspect of T12. - RTC 6 months for follow-up with repeat myeloma labs.  Will plan to repeat skeletal survey at next visit.  2.  Low iron levels: - Hemoglobin improved to 15.8.  Ferritin improved to 58.  He was taking iron tablet daily which caused mild constipation. - May decrease iron tablet to 3 times weekly.   Orders Placed This Encounter  Procedures   DG Bone Survey Met    Standing Status:    Future    Number of Occurrences:   1    Standing Expiration Date:   09/30/2023    Order Specific Question:   Reason for Exam (SYMPTOM  OR DIAGNOSIS REQUIRED)    Answer:   MGUS    Order Specific Question:   Preferred imaging location?    Answer:   [facility]   CBC with Differential    Standing Status:   Future    Standing Expiration Date:   09/30/2023   Comprehensive metabolic panel    Standing Status:   Future    Standing Expiration Date:   09/30/2023   Iron and TIBC ([facility] ONLY)    Standing Status:   Future    Standing Expiration Date:   09/30/2023   Ferritin    Standing Status:   Future    Standing Expiration Date:   09/30/2023   Kappa/lambda light chains    Standing Status:   Future    Standing Expiration Date:   09/30/2023   Protein electrophoresis, serum    Standing Status:   Future    Standing Expiration Date:   09/30/2023     Alben Deeds Teague,acting as a scribe for Doreatha Massed, MD.,have documented all relevant documentation on the behalf of Doreatha Massed, MD,as directed by  Doreatha Massed, MD while in the presence of Doreatha Massed, MD.  I, Doreatha Massed MD, have reviewed the above documentation for accuracy and completeness, and I agree  with the above.     Doreatha Massed, MD   7/25/202412:47 PM  CHIEF COMPLAINT/PURPOSE OF CONSULT:   Diagnosis: MGUS  Current Therapy: Observation  HISTORY OF PRESENT ILLNESS:   Kevin Goodman is a 72 y.o. male presenting to clinic today for evaluation of MGUS at the request of Dr. Wolfgang Phoenix.    Labs on 04/15/2022 showed 0.1 g of IgG kappa monoclonal protein with normal hemoglobin. Labs were done for workup of CKD.   Today, he states that he is doing well overall. His appetite level is at 100%. His energy level is at 50%.   He takes Eliquis 5mg  BID for atrial fibrillation. He denies any B symptoms.   His last colonoscopy was 12/25/20- biopsy showed tubular adenoma without high  grade dysplasia.  INTERVAL HISTORY:   Kevin Goodman is a 72 y.o. male presenting to the clinic today for follow-up of MGUS. He was last seen by me on 06/02/22.  Since our last visit, he was seen in the ED on 07/19/22 for localized allergic contact urticaria and given Benadryl 25mg  and discharged the same day. He underwent a colonoscopy and upper endoscopy on 5/30 with Dr. Marletta Lor. Pathology revealed nothing abnormal in the stomach  and fragments of tubular adenoma in the polyp in the descending colon.   Today, he states that he is doing well overall. His appetite level is at 100%. His energy level is at 50%.  PAST MEDICAL HISTORY:   Past Medical History: Past Medical History:  Diagnosis Date   Atrial fibrillation (HCC)    a. initialy occurring in 2013  b. recurrence in 06/2020 in the setting of COVID-19 --> s/p DCCV in 07/2020   Chronic kidney disease, stage 3a (HCC)    Diabetes mellitus, type 2 (HCC)    Dysrhythmia    GERD (gastroesophageal reflux disease)    Gout    Hypertension    Mild pulmonary hypertension (HCC)    Morbid obesity (HCC)    OSA (obstructive sleep apnea)    Paroxysmal atrial flutter (HCC)    Sleep apnea     Surgical History: Past Surgical History:  Procedure Laterality Date   BALLOON DILATION N/A 12/25/2020   Procedure: BALLOON DILATION;  Surgeon: Lanelle Bal, DO;  [facility];  Service: Endoscopy;  Laterality: N/A;   BIOPSY  12/25/2020   Procedure: BIOPSY;  Surgeon: Lanelle Bal, DO;  [facility];  Service: Endoscopy;;   BIOPSY  08/05/2022   Procedure: BIOPSY;  Surgeon: Lanelle Bal, DO;  [facility];  Service: Endoscopy;;   CARDIOVERSION N/A 07/09/2020   Procedure: CARDIOVERSION;  Surgeon: Antoine Poche, MD;  Location: [facility];  Service: Endoscopy;  Laterality: N/A;   COLONOSCOPY N/A 12/08/2016   sigmoid and descending colon diverticula, four semi-pedunculated polyps in descending and ascendign  colon, few polyps in descending and ascending colon (tubular adenomas). 3 year surveillance.    COLONOSCOPY WITH PROPOFOL N/A 12/25/2020   Surgeon: Earnest Bailey K, DO; Nonbleeding internal hemorrhoids, pancolonic diverticulosis, 2 mm tubular adenoma.  Recommended repeat in 5 years.   COLONOSCOPY WITH PROPOFOL N/A 08/05/2022   Procedure: COLONOSCOPY WITH PROPOFOL;  Surgeon: Lanelle Bal, DO;  [facility];  Service: Endoscopy;  Laterality: N/A;  7:30 am, ASA 3   ESOPHAGOGASTRODUODENOSCOPY N/A 12/08/2016   normal esophagus s/p empiric dilation, small hiatal hernia.    ESOPHAGOGASTRODUODENOSCOPY (EGD) WITH PROPOFOL N/A 12/25/2020   Surgeon: Lanelle Bal, DO;  Normal esophagus s/p empiric  dilation, gastritis biopsied, normal examined duodenum.  Pathology with slight chronic inflammation, no H. pylori.   ESOPHAGOGASTRODUODENOSCOPY (EGD) WITH PROPOFOL N/A 08/05/2022   Procedure: ESOPHAGOGASTRODUODENOSCOPY (EGD) WITH PROPOFOL;  Surgeon: Lanelle Bal, DO;  [facility];  Service: Endoscopy;  Laterality: N/A;   FINGER SURGERY     left ring   INCISION AND DRAINAGE OF WOUND Left 04/20/2016   Procedure: LEFT THIGH DEBRIDEMENT;  Surgeon: Rodman Pickle, MD;  Location: [facility];  Service: General;  Laterality: Left;   MALONEY DILATION N/A 12/08/2016   Procedure: Alvy Beal;  Surgeon: Corbin Ade, MD;  [facility];  Service: Endoscopy;  Laterality: N/A;   POLYPECTOMY  12/08/2016   Procedure: POLYPECTOMY;  Surgeon: Corbin Ade, MD;  [facility];  Service: Endoscopy;;  colon   POLYPECTOMY  12/25/2020   Procedure: POLYPECTOMY;  Surgeon: Lanelle Bal, DO;  [facility];  Service: Endoscopy;;   POLYPECTOMY  08/05/2022   Procedure: POLYPECTOMY;  Surgeon: Lanelle Bal, DO;  [facility];  Service: Endoscopy;;    Social History: Social History   Socioeconomic History   Marital status: Widowed     Spouse name: Not on file   Number of children: Not on file   Years of education: Not on file   Highest education level: Not on file  Occupational History   Occupation: Security    Employer: [employer]  Tobacco Use   Smoking status: Some Days    Types: Cigars    Last attempt to quit: 07/08/2018    Years since quitting: 4.2   Smokeless tobacco: Never   Tobacco comments:    smoked 2-3 cigars daily  Vaping Use   Vaping status: Never Used  Substance and Sexual Activity   Alcohol use: Not Currently    Comment: occasional shot of liquour every now and then    Drug use: No   Sexual activity: Yes  Other Topics Concern   Not on file  Social History Narrative   Not on file   Social Determinants of Health   Financial Resource Strain: Not on file  Food Insecurity: Food Insecurity Present (05/21/2022)   Hunger Vital Sign    Worried About Running Out of Food in the Last Year: Sometimes true    Ran Out of Food in the Last Year: Never true  Transportation Needs: No Transportation Needs (05/21/2022)   PRAPARE - Administrator, Civil Service (Medical): No    Lack of Transportation (Non-Medical): No  Physical Activity: Not on file  Stress: Not on file  Social Connections: Not on file  Intimate Partner Violence: Not At Risk (05/21/2022)   Humiliation, Afraid, Rape, and Kick questionnaire    Fear of Current or Ex-Partner: No    Emotionally Abused: No    Physically Abused: No    Sexually Abused: No    Family History: Family History  Problem Relation Age of Onset   Heart failure Mother    Hypertension Mother    Hypertension Father    Stroke Father    Prostate cancer Father    Stroke Brother    Prostate cancer Brother    Colon cancer Neg Hx    Colon polyps Neg Hx     Current Medications:  Current Outpatient Medications:    acetaminophen (TYLENOL) 500 MG tablet, Take 1,000 mg by mouth every 6 (six) hours as needed for moderate pain., Disp: , Rfl:    allopurinol  (  ZYLOPRIM) 100 MG tablet, Take 100 mg by mouth daily., Disp: , Rfl:    apixaban (ELIQUIS) 5 MG TABS tablet, Take 1 tablet (5 mg total) by mouth 2 (two) times daily., Disp: 60 tablet, Rfl: 1   atorvastatin (LIPITOR) 20 MG tablet, Take 20 mg by mouth daily., Disp: , Rfl:    ciprofloxacin (CIPRO) 250 MG tablet, SMARTSIG:1 Tablet(s) By Mouth Every 12 Hours, Disp: , Rfl:    Colchicine 0.6 MG CAPS, Take 1 capsule by mouth 2 (two) times daily., Disp: , Rfl:    dapagliflozin propanediol (FARXIGA) 5 MG TABS tablet, Take 5 mg by mouth daily., Disp: , Rfl:    diltiazem (TIAZAC) 360 MG 24 hr capsule, Take 360 mg by mouth daily., Disp: , Rfl:    ferrous sulfate 325 (65 FE) MG EC tablet, Take 325 mg by mouth daily., Disp: , Rfl:    furosemide (LASIX) 40 MG tablet, Take 40 mg by mouth 2 (two) times daily., Disp: , Rfl:    hydrALAZINE (APRESOLINE) 100 MG tablet, Take 100 mg by mouth 2 (two) times daily., Disp: , Rfl:    Insulin Degludec FlexTouch 100 UNIT/ML SOPN, SMARTSIG:40 Unit(s) SUB-Q Every Night, Disp: , Rfl:    LANTUS SOLOSTAR 100 UNIT/ML Solostar Pen, Inject 40 Units into the skin at bedtime., Disp: , Rfl:    losartan (COZAAR) 100 MG tablet, Take 1 tablet (100 mg total) by mouth daily., Disp: 90 tablet, Rfl: 3   metoprolol tartrate (LOPRESSOR) 50 MG tablet, Take 1 tablet (50 mg total) by mouth 2 (two) times daily., Disp: 60 tablet, Rfl: 1   oxymetazoline (AFRIN) 0.05 % nasal spray, Place 1 spray into both nostrils 2 (two) times daily as needed for congestion., Disp: , Rfl:    pantoprazole (PROTONIX) 40 MG tablet, Take 1 tablet (40 mg total) by mouth 2 (two) times daily before a meal. 30 minutes before breakfast, Disp: 180 tablet, Rfl: 1   TRADJENTA 5 MG TABS tablet, Take 5 mg by mouth daily., Disp: , Rfl:    diltiazem (CARDIZEM CD) 240 MG 24 hr capsule, Take 1 capsule (240 mg total) by mouth daily. (Patient not taking: Reported on 07/28/2022), Disp: 30 capsule, Rfl: 0   Allergies: No Known  Allergies  REVIEW OF SYSTEMS:   Review of Systems  Constitutional:  Negative for chills, fatigue and fever.  HENT:   Negative for lump/mass, mouth sores, nosebleeds, sore throat and trouble swallowing.   Eyes:  Negative for eye problems.  Respiratory:  Positive for shortness of breath. Negative for cough.   Cardiovascular:  Negative for chest pain, leg swelling and palpitations.  Gastrointestinal:  Positive for constipation. Negative for abdominal pain, diarrhea, nausea and vomiting.  Genitourinary:  Negative for bladder incontinence, difficulty urinating, dysuria, frequency, hematuria and nocturia.   Musculoskeletal:  Negative for arthralgias, back pain, flank pain, myalgias and neck pain.  Skin:  Negative for itching and rash.  Neurological:  Negative for dizziness, headaches and numbness.  Hematological:  Does not bruise/bleed easily.  Psychiatric/Behavioral:  Positive for sleep disturbance. Negative for depression and suicidal ideas. The patient is not nervous/anxious.   All other systems reviewed and are negative.    VITALS:   Blood pressure 110/71, pulse 80, temperature 98.2 F (36.8 C), temperature source Oral, resp. rate 18, weight (!) 384 lb 3.2 oz (174.3 kg), SpO2 99%.  Wt Readings from Last 3 Encounters:  09/30/22 (!) 384 lb 3.2 oz (174.3 kg)  07/19/22 (!) 396 lb 13.3 oz (180 kg)  06/30/22 (!) 395 lb (179.2 kg)    Body mass index is 50.69 kg/m.   PHYSICAL EXAM:   Physical Exam Vitals and nursing note reviewed. Exam conducted with a chaperone present.  Constitutional:      Appearance: Normal appearance.  Cardiovascular:     Rate and Rhythm: Normal rate and regular rhythm.     Pulses: Normal pulses.     Heart sounds: Normal heart sounds.  Pulmonary:     Effort: Pulmonary effort is normal.     Breath sounds: Normal breath sounds.  Abdominal:     Palpations: Abdomen is soft. There is no hepatomegaly, splenomegaly or mass.     Tenderness: There is no abdominal  tenderness.  Musculoskeletal:     Right lower leg: No edema.     Left lower leg: No edema.  Lymphadenopathy:     Cervical: No cervical adenopathy.     Right cervical: No superficial, deep or posterior cervical adenopathy.    Left cervical: No superficial, deep or posterior cervical adenopathy.     Upper Body:     Right upper body: No supraclavicular or axillary adenopathy.     Left upper body: No supraclavicular or axillary adenopathy.  Neurological:     General: No focal deficit present.     Mental Status: He is alert and oriented to person, place, and time.  Psychiatric:        Mood and Affect: Mood normal.        Behavior: Behavior normal.    LABS:      Latest Ref Rng & Units 09/24/2022   12:59 PM 12/09/2021    1:32 AM 12/08/2021   10:30 PM  CBC  WBC 4.0 - 10.5 K/uL 13.6  19.2  19.7   Hemoglobin 13.0 - 17.0 g/dL 16.1  09.6  04.5   Hematocrit 39.0 - 52.0 % 48.4  41.3  44.7   Platelets 150 - 400 K/uL 244  233  262       Latest Ref Rng & Units 12/12/2021    1:03 AM 12/11/2021    1:07 AM 12/10/2021    1:28 AM  CMP  Glucose 70 - 99 mg/dL 409  811  914   BUN 8 - 23 mg/dL 22  27  29    Creatinine 0.61 - 1.24 mg/dL 7.82  9.56  2.13   Sodium 135 - 145 mmol/L 136  137  136   Potassium 3.5 - 5.1 mmol/L 4.6  4.4  4.1   Chloride 98 - 111 mmol/L 103  104  103   CO2 22 - 32 mmol/L 24  24  24    Calcium 8.9 - 10.3 mg/dL 9.7  9.5  9.3    No results found for: "CEA1", "CEA" / No results found for: "CEA1", "CEA" No results found for: "PSA1" No results found for: "CAN199" No results found for: "CAN125"  Lab Results  Component Value Date   TOTALPROTELP 7.1 09/24/2022   ALBUMINELP 3.3 09/24/2022   A1GS 0.3 09/24/2022   A2GS 0.8 09/24/2022   BETS 1.4 (H) 09/24/2022   GAMS 1.4 09/24/2022   MSPIKE 0.5 (H) 09/24/2022   SPEI Comment 09/24/2022   Lab Results  Component Value Date   TIBC 359 09/24/2022   TIBC 410 05/21/2022   FERRITIN 58 09/24/2022   FERRITIN 25 05/21/2022    FERRITIN 33 06/14/2020   IRONPCTSAT 13 (L) 09/24/2022   IRONPCTSAT 15 (L) 05/21/2022   Lab Results  Component Value Date   LDH  126 05/21/2022   STUDIES:   No results found.

## 2022-09-30 ENCOUNTER — Inpatient Hospital Stay (HOSPITAL_BASED_OUTPATIENT_CLINIC_OR_DEPARTMENT_OTHER): Payer: Medicare PPO | Admitting: Hematology

## 2022-09-30 ENCOUNTER — Ambulatory Visit (HOSPITAL_COMMUNITY)
Admission: RE | Admit: 2022-09-30 | Discharge: 2022-09-30 | Disposition: A | Discharge status: Home / Self Care | Payer: Medicare PPO | Source: Ambulatory Visit | Attending: Hematology | Admitting: Hematology

## 2022-09-30 VITALS — BP 110/71 | HR 80 | Temp 98.2°F | Resp 18 | Wt 384.2 lb

## 2022-09-30 DIAGNOSIS — M899 Disorder of bone, unspecified: Secondary | ICD-10-CM

## 2022-09-30 DIAGNOSIS — N1832 Chronic kidney disease, stage 3b: Secondary | ICD-10-CM | POA: Diagnosis not present

## 2022-09-30 DIAGNOSIS — K5900 Constipation, unspecified: Secondary | ICD-10-CM | POA: Diagnosis not present

## 2022-09-30 DIAGNOSIS — M898X9 Other specified disorders of bone, unspecified site: Secondary | ICD-10-CM

## 2022-09-30 DIAGNOSIS — D472 Monoclonal gammopathy: Secondary | ICD-10-CM | POA: Insufficient documentation

## 2022-09-30 DIAGNOSIS — K219 Gastro-esophageal reflux disease without esophagitis: Secondary | ICD-10-CM | POA: Diagnosis not present

## 2022-09-30 DIAGNOSIS — K635 Polyp of colon: Secondary | ICD-10-CM | POA: Diagnosis not present

## 2022-09-30 DIAGNOSIS — I129 Hypertensive chronic kidney disease with stage 1 through stage 4 chronic kidney disease, or unspecified chronic kidney disease: Secondary | ICD-10-CM | POA: Diagnosis not present

## 2022-09-30 DIAGNOSIS — E1122 Type 2 diabetes mellitus with diabetic chronic kidney disease: Secondary | ICD-10-CM | POA: Diagnosis not present

## 2022-09-30 DIAGNOSIS — I4891 Unspecified atrial fibrillation: Secondary | ICD-10-CM | POA: Diagnosis not present

## 2022-09-30 NOTE — Patient Instructions (Addendum)
[facility] at [facility] Discharge Instructions   You were seen and examined today by Dr. Ellin Saba.  He reviewed the results of your blood work which are mostly normal/stable. Your m-spike has gone up to 0.5, up from 0.1 when it was last checked. We will continue to closely monitor.   Dr. Kirtland Bouchard wants your to restart your iron pills. Take on Mondays, Wednesdays, and Fridays.   Make sure you stop by radiology on your way out today to get the x-rays of your spine.   We will see you back in 6 months. We will repeat lab work prior to your next visit.   Return as scheduled.      Thank you for choosing [facility] at [facility] to provide your oncology and hematology care.  To afford each patient quality time with our provider, please arrive at least 15 minutes before your scheduled appointment time.   If you have a lab appointment with the Cancer Center please come in thru the Main Entrance and check in at the main information desk.  You need to re-schedule your appointment should you arrive 10 or more minutes late.  We strive to give you quality time with our providers, and arriving late affects you and other patients whose appointments are after yours.  Also, if you no show three or more times for appointments you may be dismissed from the clinic at the providers discretion.     Again, thank you for choosing [facility].  Our hope is that these requests will decrease the amount of time that you wait before being seen by our physicians.       _____________________________________________________________  Should you have questions after your visit to [facility], please contact our office at [phone] and follow the prompts.  Our office hours are 8:00 a.m. and 4:30 p.m. Monday - Friday.  Please note that voicemails left after 4:00 p.m. may not be returned until the following business day.  We are closed weekends and  major holidays.  You do have access to a nurse 24-7, just call the main number to the clinic [phone] and do not press any options, hold on the line and a nurse will answer the phone.    For prescription refill requests, have your pharmacy contact our office and allow 72 hours.    Due to Covid, you will need to wear a mask upon entering the hospital. If you do not have a mask, a mask will be given to you at the Main Entrance upon arrival. For doctor visits, patients may have 1 support person age 43 or older with them. For treatment visits, patients can not have anyone with them due to social distancing guidelines and our immunocompromised population.

## 2022-10-01 DIAGNOSIS — N2581 Secondary hyperparathyroidism of renal origin: Secondary | ICD-10-CM | POA: Diagnosis not present

## 2022-10-01 DIAGNOSIS — E1129 Type 2 diabetes mellitus with other diabetic kidney complication: Secondary | ICD-10-CM | POA: Diagnosis not present

## 2022-10-01 DIAGNOSIS — E611 Iron deficiency: Secondary | ICD-10-CM | POA: Diagnosis not present

## 2022-10-01 DIAGNOSIS — N1832 Chronic kidney disease, stage 3b: Secondary | ICD-10-CM | POA: Diagnosis not present

## 2022-10-08 DIAGNOSIS — I10 Essential (primary) hypertension: Secondary | ICD-10-CM | POA: Diagnosis not present

## 2022-10-08 DIAGNOSIS — E1165 Type 2 diabetes mellitus with hyperglycemia: Secondary | ICD-10-CM | POA: Diagnosis not present

## 2022-11-08 DIAGNOSIS — I10 Essential (primary) hypertension: Secondary | ICD-10-CM | POA: Diagnosis not present

## 2022-11-08 DIAGNOSIS — E1165 Type 2 diabetes mellitus with hyperglycemia: Secondary | ICD-10-CM | POA: Diagnosis not present

## 2022-12-01 DIAGNOSIS — E1122 Type 2 diabetes mellitus with diabetic chronic kidney disease: Secondary | ICD-10-CM | POA: Diagnosis not present

## 2022-12-01 DIAGNOSIS — R809 Proteinuria, unspecified: Secondary | ICD-10-CM | POA: Diagnosis not present

## 2022-12-01 DIAGNOSIS — N2581 Secondary hyperparathyroidism of renal origin: Secondary | ICD-10-CM | POA: Diagnosis not present

## 2022-12-01 DIAGNOSIS — E1129 Type 2 diabetes mellitus with other diabetic kidney complication: Secondary | ICD-10-CM | POA: Diagnosis not present

## 2022-12-01 DIAGNOSIS — N1832 Chronic kidney disease, stage 3b: Secondary | ICD-10-CM | POA: Diagnosis not present

## 2022-12-03 DIAGNOSIS — E1129 Type 2 diabetes mellitus with other diabetic kidney complication: Secondary | ICD-10-CM | POA: Diagnosis not present

## 2022-12-03 DIAGNOSIS — E1122 Type 2 diabetes mellitus with diabetic chronic kidney disease: Secondary | ICD-10-CM | POA: Diagnosis not present

## 2022-12-03 DIAGNOSIS — N1832 Chronic kidney disease, stage 3b: Secondary | ICD-10-CM | POA: Diagnosis not present

## 2022-12-03 DIAGNOSIS — R809 Proteinuria, unspecified: Secondary | ICD-10-CM | POA: Diagnosis not present

## 2022-12-03 DIAGNOSIS — N2581 Secondary hyperparathyroidism of renal origin: Secondary | ICD-10-CM | POA: Diagnosis not present

## 2022-12-10 DIAGNOSIS — E1129 Type 2 diabetes mellitus with other diabetic kidney complication: Secondary | ICD-10-CM | POA: Diagnosis not present

## 2022-12-10 DIAGNOSIS — E1122 Type 2 diabetes mellitus with diabetic chronic kidney disease: Secondary | ICD-10-CM | POA: Diagnosis not present

## 2022-12-10 DIAGNOSIS — R809 Proteinuria, unspecified: Secondary | ICD-10-CM | POA: Diagnosis not present

## 2022-12-10 DIAGNOSIS — N1832 Chronic kidney disease, stage 3b: Secondary | ICD-10-CM | POA: Diagnosis not present

## 2022-12-23 DIAGNOSIS — E1165 Type 2 diabetes mellitus with hyperglycemia: Secondary | ICD-10-CM | POA: Diagnosis not present

## 2022-12-23 DIAGNOSIS — N1832 Chronic kidney disease, stage 3b: Secondary | ICD-10-CM | POA: Diagnosis not present

## 2022-12-23 DIAGNOSIS — K219 Gastro-esophageal reflux disease without esophagitis: Secondary | ICD-10-CM | POA: Diagnosis not present

## 2022-12-23 DIAGNOSIS — I10 Essential (primary) hypertension: Secondary | ICD-10-CM | POA: Diagnosis not present

## 2022-12-23 DIAGNOSIS — I480 Paroxysmal atrial fibrillation: Secondary | ICD-10-CM | POA: Diagnosis not present

## 2022-12-23 DIAGNOSIS — Z23 Encounter for immunization: Secondary | ICD-10-CM | POA: Diagnosis not present

## 2023-01-05 ENCOUNTER — Encounter: Payer: Self-pay | Admitting: Cardiology

## 2023-01-05 ENCOUNTER — Ambulatory Visit: Payer: Medicare PPO | Attending: Cardiology | Admitting: Cardiology

## 2023-01-05 VITALS — BP 110/60 | HR 55 | Ht 73.0 in | Wt 397.8 lb

## 2023-01-05 DIAGNOSIS — I5032 Chronic diastolic (congestive) heart failure: Secondary | ICD-10-CM

## 2023-01-05 DIAGNOSIS — E782 Mixed hyperlipidemia: Secondary | ICD-10-CM | POA: Diagnosis not present

## 2023-01-05 DIAGNOSIS — I4892 Unspecified atrial flutter: Secondary | ICD-10-CM | POA: Diagnosis not present

## 2023-01-05 DIAGNOSIS — I10 Essential (primary) hypertension: Secondary | ICD-10-CM

## 2023-01-05 NOTE — Progress Notes (Signed)
Clinical Summary Kevin Goodman is a 72 y.o.male seen today for follow up of the following medical problems.    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 Was rate controlled, plan was for outpatient DCCV in 3 weeks. At f/u mixed compliance with eliquis and DCCV was not scheduled - admit 12/2021 with AKI and generalized weakness, issues with afib with RVR that admission  - has done well with just rate control  -no recent palpitations.  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 Hyperlipidemia  12/2021 TC 92 TG 85 HDL 31 LDL 44 - compliant with meds   5. DM2 - followed by pcp  6. 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  Past Medical History:  Diagnosis Date   Atrial fibrillation (HCC)    a. initialy occurring in 2013  b. recurrence in 06/2020 in the setting of COVID-19 --> s/p DCCV in 07/2020   Chronic kidney disease, stage 3a (HCC)    Diabetes mellitus, type 2 (HCC)    Dysrhythmia    GERD (gastroesophageal reflux disease)    Gout    Hypertension    Mild pulmonary hypertension (HCC)    Morbid obesity (HCC)    OSA (obstructive sleep apnea)    Paroxysmal atrial flutter (HCC)    Sleep apnea      No Known Allergies   Current Outpatient Medications  Medication Sig Dispense Refill   acetaminophen (TYLENOL) 500 MG tablet Take 1,000 mg by mouth every 6 (six) hours as needed for moderate pain.     allopurinol (ZYLOPRIM) 100 MG tablet Take 100 mg by mouth daily.     apixaban (ELIQUIS) 5 MG TABS tablet Take 1 tablet (5 mg total) by mouth 2 (two) times daily. 60 tablet 1   atorvastatin (LIPITOR) 20 MG tablet Take 20 mg by mouth daily.     ciprofloxacin (CIPRO) 250 MG tablet SMARTSIG:1 Tablet(s) By Mouth Every 12 Hours     Colchicine 0.6 MG CAPS Take 1 capsule  by mouth 2 (two) times daily.     dapagliflozin propanediol (FARXIGA) 5 MG TABS tablet Take 5 mg by mouth daily.     diltiazem (CARDIZEM CD) 240 MG 24 hr capsule Take 1 capsule (240 mg total) by mouth daily. (Patient not taking: Reported on 07/28/2022) 30 capsule 0   diltiazem (TIAZAC) 360 MG 24 hr capsule Take 360 mg by mouth daily.     ferrous sulfate 325 (65 FE) MG EC tablet Take 325 mg by mouth daily.     furosemide (LASIX) 40 MG tablet Take 40 mg by mouth 2 (two) times daily.     hydrALAZINE (APRESOLINE) 100 MG tablet Take 100 mg by mouth 2 (two) times daily.     Insulin Degludec FlexTouch 100 UNIT/ML SOPN SMARTSIG:40 Unit(s) SUB-Q Every Night     LANTUS SOLOSTAR 100 UNIT/ML Solostar Pen Inject 40 Units into the skin at bedtime.     losartan (COZAAR) 100 MG tablet Take 1 tablet (100 mg total) by mouth daily. 90 tablet 3   metoprolol tartrate (LOPRESSOR) 50 MG tablet Take 1 tablet (50 mg total) by mouth 2 (two) times daily. 60 tablet 1   oxymetazoline (AFRIN) 0.05 % nasal spray Place  1 spray into both nostrils 2 (two) times daily as needed for congestion.     pantoprazole (PROTONIX) 40 MG tablet Take 1 tablet (40 mg total) by mouth 2 (two) times daily before a meal. 30 minutes before breakfast 180 tablet 1   TRADJENTA 5 MG TABS tablet Take 5 mg by mouth daily.     No current facility-administered medications for this visit.     Past Surgical History:  Procedure Laterality Date   BALLOON DILATION N/A 12/25/2020   Procedure: BALLOON DILATION;  Surgeon: Lanelle Bal, DO;  [facility];  Service: Endoscopy;  Laterality: N/A;   BIOPSY  12/25/2020   Procedure: BIOPSY;  Surgeon: Lanelle Bal, DO;  [facility];  Service: Endoscopy;;   BIOPSY  08/05/2022   Procedure: BIOPSY;  Surgeon: Lanelle Bal, DO;  [facility];  Service: Endoscopy;;   CARDIOVERSION N/A 07/09/2020   Procedure: CARDIOVERSION;  Surgeon: Antoine Poche, MD;  Location: [facility];  Service: Endoscopy;  Laterality: N/A;   COLONOSCOPY N/A 12/08/2016   sigmoid and descending colon diverticula, four semi-pedunculated polyps in descending and ascendign colon, few polyps in descending and ascending colon (tubular adenomas). 3 year surveillance.    COLONOSCOPY WITH PROPOFOL N/A 12/25/2020   Surgeon: Earnest Bailey K, DO; Nonbleeding internal hemorrhoids, pancolonic diverticulosis, 2 mm tubular adenoma.  Recommended repeat in 5 years.   COLONOSCOPY WITH PROPOFOL N/A 08/05/2022   Procedure: COLONOSCOPY WITH PROPOFOL;  Surgeon: Lanelle Bal, DO;  [facility];  Service: Endoscopy;  Laterality: N/A;  7:30 am, ASA 3   ESOPHAGOGASTRODUODENOSCOPY N/A 12/08/2016   normal esophagus s/p empiric dilation, small hiatal hernia.    ESOPHAGOGASTRODUODENOSCOPY (EGD) WITH PROPOFOL N/A 12/25/2020   Surgeon: Lanelle Bal, DO;  Normal esophagus s/p empiric dilation, gastritis biopsied, normal examined duodenum.  Pathology with slight chronic inflammation, no H. pylori.   ESOPHAGOGASTRODUODENOSCOPY (EGD) WITH PROPOFOL N/A 08/05/2022   Procedure: ESOPHAGOGASTRODUODENOSCOPY (EGD) WITH PROPOFOL;  Surgeon: Lanelle Bal, DO;  [facility];  Service: Endoscopy;  Laterality: N/A;   FINGER SURGERY     left ring   INCISION AND DRAINAGE OF WOUND Left 04/20/2016   Procedure: LEFT THIGH DEBRIDEMENT;  Surgeon: Rodman Pickle, MD;  Location: [facility];  Service: General;  Laterality: Left;   MALONEY DILATION N/A 12/08/2016   Procedure: Alvy Beal;  Surgeon: Corbin Ade, MD;  [facility];  Service: Endoscopy;  Laterality: N/A;   POLYPECTOMY  12/08/2016   Procedure: POLYPECTOMY;  Surgeon: Corbin Ade, MD;  [facility];  Service: Endoscopy;;  colon   POLYPECTOMY  12/25/2020   Procedure: POLYPECTOMY;  Surgeon: Lanelle Bal, DO;  [facility];  Service: Endoscopy;;   POLYPECTOMY  08/05/2022   Procedure: POLYPECTOMY;   Surgeon: Lanelle Bal, DO;  [facility];  Service: Endoscopy;;     No Known Allergies    Family History  Problem Relation Age of Onset   Heart failure Mother    Hypertension Mother    Hypertension Father    Stroke Father    Prostate cancer Father    Stroke Brother    Prostate cancer Brother    Colon cancer Neg Hx    Colon polyps Neg Hx      Social History Kevin Goodman reports that he has been smoking cigars. He has never used smokeless tobacco. Kevin Goodman reports that he does not currently use  alcohol.   Review of Systems CONSTITUTIONAL: No weight loss, fever, chills, weakness or fatigue.  HEENT: Eyes: No visual loss, blurred vision, double vision or yellow sclerae.No hearing loss, sneezing, congestion, runny nose or sore throat.  SKIN: No rash or itching.  CARDIOVASCULAR: per hpi RESPIRATORY: No shortness of breath, cough or sputum.  GASTROINTESTINAL: No anorexia, nausea, vomiting or diarrhea. No abdominal pain or blood.  GENITOURINARY: No burning on urination, no polyuria NEUROLOGICAL: No headache, dizziness, syncope, paralysis, ataxia, numbness or tingling in the extremities. No change in bowel or bladder control.  MUSCULOSKELETAL: No muscle, back pain, joint pain or stiffness.  LYMPHATICS: No enlarged nodes. No history of splenectomy.  PSYCHIATRIC: No history of depression or anxiety.  ENDOCRINOLOGIC: No reports of sweating, cold or heat intolerance. No polyuria or polydipsia.  Marland Kitchen   Physical Examination Today's Vitals   01/05/23 1042  BP: 110/60  Pulse: (!) 55  SpO2: 97%  Weight: (!) 397 lb 12.8 oz (180.4 kg)  Height: 6\' 1"  (1.854 m)   Body mass index is 52.48 kg/m.  Gen: resting comfortably, no acute distress HEENT: no scleral icterus, pupils equal round and reactive, no palptable cervical adenopathy,  CV: RRR, no m/rg, no jvd Resp: Clear to auscultation bilaterally GI: abdomen is soft, non-tender, non-distended, normal bowel sounds,  no hepatosplenomegaly MSK: extremities are warm, no edema.  Skin: warm, no rash Neuro:  no focal deficits Psych: appropriate affect   Diagnostic Studies 06/2021 echo  1. Left ventricular ejection fraction, by estimation, is 50 to 55%. The  left ventricle has low normal function. The left ventricle has no regional  wall motion abnormalities. There is moderate concentric left ventricular  hypertrophy. Diastolic function  indeterminant due to AFib.   2. Right ventricular systolic function is normal. The right ventricular  size is mildly enlarged. There is normal pulmonary artery systolic  pressure. The estimated right ventricular systolic pressure is 29.0 mmHg.   3. Left atrial size was moderately dilated.   4. The mitral valve is normal in structure. Trivial mitral valve  regurgitation.   5. The aortic valve is tricuspid. There is mild calcification of the  aortic valve. There is mild thickening of the aortic valve. Aortic valve  regurgitation is trivial. Aortic valve sclerosis/calcification is present,  without any evidence of aortic  stenosis.   6. The inferior vena cava is dilated in size with >50% respiratory  variability, suggesting right atrial pressure of 8 mmHg.    Assessment and Plan  1.Long standing persistent aflutter/acquired thrombophilia - We had not pursued repeat DCCV in the past due to mixed compliance with anticoagulation. He has been rate controlled and asymptomatic at recent visits and thus will no longer pursue - no symptoms, continue current meds - EKG today shows rate controlled aflutter   2.HTN -at goal, continue current meds   3. Hyperliidemia - has been at goal, continue current meds  4. Chronic HFpEF - mild edema today, lungs are clear. Perhaps LE edema more related to obesity, also reports prior surgery on right leg with chronic swelling - continue current meds      Antoine Poche, M.D.

## 2023-01-05 NOTE — Patient Instructions (Addendum)

## 2023-01-23 DIAGNOSIS — I10 Essential (primary) hypertension: Secondary | ICD-10-CM | POA: Diagnosis not present

## 2023-01-23 DIAGNOSIS — E1165 Type 2 diabetes mellitus with hyperglycemia: Secondary | ICD-10-CM | POA: Diagnosis not present

## 2023-02-10 ENCOUNTER — Observation Stay (HOSPITAL_COMMUNITY)
Admission: EM | Admit: 2023-02-10 | Discharge: 2023-02-11 | Disposition: A | Discharge status: Home / Self Care | Payer: Medicare PPO | Attending: Internal Medicine | Admitting: Internal Medicine

## 2023-02-10 ENCOUNTER — Other Ambulatory Visit: Payer: Self-pay

## 2023-02-10 ENCOUNTER — Encounter (HOSPITAL_COMMUNITY): Payer: Self-pay

## 2023-02-10 ENCOUNTER — Emergency Department (HOSPITAL_COMMUNITY): Payer: Medicare PPO

## 2023-02-10 DIAGNOSIS — Z6841 Body Mass Index (BMI) 40.0 and over, adult: Secondary | ICD-10-CM | POA: Insufficient documentation

## 2023-02-10 DIAGNOSIS — E66813 Obesity, class 3: Secondary | ICD-10-CM | POA: Diagnosis present

## 2023-02-10 DIAGNOSIS — Z794 Long term (current) use of insulin: Secondary | ICD-10-CM | POA: Insufficient documentation

## 2023-02-10 DIAGNOSIS — E119 Type 2 diabetes mellitus without complications: Secondary | ICD-10-CM | POA: Diagnosis present

## 2023-02-10 DIAGNOSIS — R001 Bradycardia, unspecified: Principal | ICD-10-CM

## 2023-02-10 DIAGNOSIS — R0602 Shortness of breath: Secondary | ICD-10-CM | POA: Insufficient documentation

## 2023-02-10 DIAGNOSIS — N1832 Chronic kidney disease, stage 3b: Secondary | ICD-10-CM | POA: Diagnosis not present

## 2023-02-10 DIAGNOSIS — I129 Hypertensive chronic kidney disease with stage 1 through stage 4 chronic kidney disease, or unspecified chronic kidney disease: Secondary | ICD-10-CM | POA: Insufficient documentation

## 2023-02-10 DIAGNOSIS — R0600 Dyspnea, unspecified: Secondary | ICD-10-CM | POA: Diagnosis not present

## 2023-02-10 DIAGNOSIS — T461X1A Poisoning by calcium-channel blockers, accidental (unintentional), initial encounter: Secondary | ICD-10-CM | POA: Diagnosis not present

## 2023-02-10 DIAGNOSIS — E1121 Type 2 diabetes mellitus with diabetic nephropathy: Secondary | ICD-10-CM | POA: Diagnosis present

## 2023-02-10 DIAGNOSIS — Z79899 Other long term (current) drug therapy: Secondary | ICD-10-CM | POA: Insufficient documentation

## 2023-02-10 DIAGNOSIS — T50901A Poisoning by unspecified drugs, medicaments and biological substances, accidental (unintentional), initial encounter: Secondary | ICD-10-CM | POA: Diagnosis not present

## 2023-02-10 DIAGNOSIS — F17290 Nicotine dependence, other tobacco product, uncomplicated: Secondary | ICD-10-CM | POA: Diagnosis not present

## 2023-02-10 DIAGNOSIS — Z7901 Long term (current) use of anticoagulants: Secondary | ICD-10-CM | POA: Insufficient documentation

## 2023-02-10 DIAGNOSIS — E1122 Type 2 diabetes mellitus with diabetic chronic kidney disease: Secondary | ICD-10-CM | POA: Insufficient documentation

## 2023-02-10 DIAGNOSIS — I4891 Unspecified atrial fibrillation: Secondary | ICD-10-CM | POA: Diagnosis not present

## 2023-02-10 DIAGNOSIS — Z7984 Long term (current) use of oral hypoglycemic drugs: Secondary | ICD-10-CM | POA: Insufficient documentation

## 2023-02-10 LAB — CBC WITH DIFFERENTIAL/PLATELET
Abs Immature Granulocytes: 0.11 10*3/uL — ABNORMAL HIGH (ref 0.00–0.07)
Basophils Absolute: 0.1 10*3/uL (ref 0.0–0.1)
Basophils Relative: 0 %
Eosinophils Absolute: 0.1 10*3/uL (ref 0.0–0.5)
Eosinophils Relative: 1 %
HCT: 43.7 % (ref 39.0–52.0)
Hemoglobin: 13.8 g/dL (ref 13.0–17.0)
Immature Granulocytes: 1 %
Lymphocytes Relative: 20 %
Lymphs Abs: 2.9 10*3/uL (ref 0.7–4.0)
MCH: 30.1 pg (ref 26.0–34.0)
MCHC: 31.6 g/dL (ref 30.0–36.0)
MCV: 95.4 fL (ref 80.0–100.0)
Monocytes Absolute: 1 10*3/uL (ref 0.1–1.0)
Monocytes Relative: 7 %
Neutro Abs: 10.1 10*3/uL — ABNORMAL HIGH (ref 1.7–7.7)
Neutrophils Relative %: 71 %
Platelets: 248 10*3/uL (ref 150–400)
RBC: 4.58 MIL/uL (ref 4.22–5.81)
RDW: 15 % (ref 11.5–15.5)
WBC: 14.2 10*3/uL — ABNORMAL HIGH (ref 4.0–10.5)
nRBC: 0 % (ref 0.0–0.2)

## 2023-02-10 LAB — COMPREHENSIVE METABOLIC PANEL
ALT: 106 U/L — ABNORMAL HIGH (ref 0–44)
AST: 108 U/L — ABNORMAL HIGH (ref 15–41)
Albumin: 3.6 g/dL (ref 3.5–5.0)
Alkaline Phosphatase: 102 U/L (ref 38–126)
Anion gap: 13 (ref 5–15)
BUN: 36 mg/dL — ABNORMAL HIGH (ref 8–23)
CO2: 19 mmol/L — ABNORMAL LOW (ref 22–32)
Calcium: 8.9 mg/dL (ref 8.9–10.3)
Chloride: 104 mmol/L (ref 98–111)
Creatinine, Ser: 2.02 mg/dL — ABNORMAL HIGH (ref 0.61–1.24)
GFR, Estimated: 35 mL/min — ABNORMAL LOW (ref >60)
Glucose, Bld: 174 mg/dL — ABNORMAL HIGH (ref 70–99)
Potassium: 4.3 mmol/L (ref 3.5–5.1)
Sodium: 136 mmol/L (ref 135–145)
Total Bilirubin: 0.6 mg/dL (ref <1.2)
Total Protein: 6.6 g/dL (ref 6.5–8.1)

## 2023-02-10 LAB — TROPONIN I (HIGH SENSITIVITY)
Troponin I (High Sensitivity): 6 ng/L (ref <18)
Troponin I (High Sensitivity): 5 ng/L (ref <18)

## 2023-02-10 LAB — CBG MONITORING, ED: Glucose-Capillary: 186 mg/dL — ABNORMAL HIGH (ref 70–99)

## 2023-02-10 LAB — HEMOGLOBIN A1C
Hgb A1c MFr Bld: 8.4 % — ABNORMAL HIGH (ref 4.8–5.6)
Mean Plasma Glucose: 194.38 mg/dL

## 2023-02-10 LAB — BRAIN NATRIURETIC PEPTIDE: B Natriuretic Peptide: 489 pg/mL — ABNORMAL HIGH (ref 0.0–100.0)

## 2023-02-10 LAB — TSH: TSH: 6.353 u[IU]/mL — ABNORMAL HIGH (ref 0.350–4.500)

## 2023-02-10 LAB — MAGNESIUM: Magnesium: 2.1 mg/dL (ref 1.7–2.4)

## 2023-02-10 LAB — GLUCOSE, CAPILLARY
Glucose-Capillary: 122 mg/dL — ABNORMAL HIGH (ref 70–99)
Glucose-Capillary: 148 mg/dL — ABNORMAL HIGH (ref 70–99)

## 2023-02-10 LAB — HIV ANTIBODY (ROUTINE TESTING W REFLEX): HIV Screen 4th Generation wRfx: NONREACTIVE

## 2023-02-10 MED ORDER — ALLOPURINOL 100 MG PO TABS
100.0000 mg | ORAL_TABLET | Freq: Every day | ORAL | Status: DC
  Administered 2023-02-11: 100 mg via ORAL
  Filled 2023-02-10: qty 1

## 2023-02-10 MED ORDER — HYDRALAZINE HCL 20 MG/ML IJ SOLN: 10.0000 mg | Freq: Three times a day (TID) | INTRAMUSCULAR | Status: DC | PRN

## 2023-02-10 MED ORDER — INSULIN ASPART 100 UNIT/ML IJ SOLN
0.0000 [IU] | Freq: Three times a day (TID) | INTRAMUSCULAR | Status: DC
  Administered 2023-02-10: 3 [IU] via SUBCUTANEOUS
  Administered 2023-02-10 – 2023-02-11 (×2): 2 [IU] via SUBCUTANEOUS
  Administered 2023-02-11: 3 [IU] via SUBCUTANEOUS
  Filled 2023-02-10: qty 1

## 2023-02-10 MED ORDER — ATORVASTATIN CALCIUM 20 MG PO TABS
20.0000 mg | ORAL_TABLET | Freq: Every day | ORAL | Status: DC
  Administered 2023-02-11: 20 mg via ORAL
  Filled 2023-02-10: qty 1

## 2023-02-10 MED ORDER — ALUM & MAG HYDROXIDE-SIMETH 200-200-20 MG/5ML PO SUSP: 30.0000 mL | Freq: Once | ORAL | Status: DC

## 2023-02-10 MED ORDER — ACETAMINOPHEN 325 MG PO TABS: 650.0000 mg | ORAL_TABLET | Freq: Four times a day (QID) | ORAL | Status: DC | PRN

## 2023-02-10 MED ORDER — INSULIN GLARGINE-YFGN 100 UNIT/ML ~~LOC~~ SOLN
10.0000 [IU] | Freq: Every day | SUBCUTANEOUS | Status: DC
  Administered 2023-02-10: 10 [IU] via SUBCUTANEOUS
  Filled 2023-02-10 (×2): qty 0.1

## 2023-02-10 MED ORDER — FERROUS SULFATE 325 (65 FE) MG PO TABS
325.0000 mg | ORAL_TABLET | Freq: Every day | ORAL | Status: DC
  Administered 2023-02-11: 325 mg via ORAL
  Filled 2023-02-10: qty 1

## 2023-02-10 MED ORDER — ONDANSETRON HCL 4 MG PO TABS: 4.0000 mg | ORAL_TABLET | Freq: Four times a day (QID) | ORAL | Status: DC | PRN

## 2023-02-10 MED ORDER — SODIUM CHLORIDE 0.9% FLUSH
3.0000 mL | Freq: Two times a day (BID) | INTRAVENOUS | Status: DC
  Administered 2023-02-10 – 2023-02-11 (×2): 3 mL via INTRAVENOUS

## 2023-02-10 MED ORDER — ONDANSETRON HCL 4 MG/2ML IJ SOLN: 4.0000 mg | Freq: Four times a day (QID) | INTRAMUSCULAR | Status: DC | PRN

## 2023-02-10 MED ORDER — INSULIN ASPART 100 UNIT/ML IJ SOLN: 0.0000 [IU] | Freq: Every day | INTRAMUSCULAR | Status: DC

## 2023-02-10 MED ORDER — SODIUM CHLORIDE 0.9% FLUSH: 3.0000 mL | INTRAVENOUS | Status: DC | PRN

## 2023-02-10 MED ORDER — ACETAMINOPHEN 650 MG RE SUPP: 650.0000 mg | Freq: Four times a day (QID) | RECTAL | Status: DC | PRN

## 2023-02-10 MED ORDER — HYDRALAZINE HCL 25 MG PO TABS
100.0000 mg | ORAL_TABLET | Freq: Two times a day (BID) | ORAL | Status: DC
  Administered 2023-02-10 – 2023-02-11 (×2): 100 mg via ORAL
  Filled 2023-02-10 (×2): qty 4

## 2023-02-10 MED ORDER — PANTOPRAZOLE SODIUM 40 MG PO TBEC
40.0000 mg | DELAYED_RELEASE_TABLET | Freq: Two times a day (BID) | ORAL | Status: DC
  Administered 2023-02-10 – 2023-02-11 (×2): 40 mg via ORAL
  Filled 2023-02-10 (×2): qty 1

## 2023-02-10 MED ORDER — DAPAGLIFLOZIN PROPANEDIOL 5 MG PO TABS
5.0000 mg | ORAL_TABLET | Freq: Every day | ORAL | Status: DC
  Administered 2023-02-11: 5 mg via ORAL
  Filled 2023-02-10 (×3): qty 1

## 2023-02-10 MED ORDER — LOSARTAN POTASSIUM 50 MG PO TABS
100.0000 mg | ORAL_TABLET | Freq: Every day | ORAL | Status: DC
  Administered 2023-02-11: 100 mg via ORAL
  Filled 2023-02-10: qty 2

## 2023-02-10 MED ORDER — APIXABAN 5 MG PO TABS
5.0000 mg | ORAL_TABLET | Freq: Two times a day (BID) | ORAL | Status: DC
  Administered 2023-02-10 – 2023-02-11 (×2): 5 mg via ORAL
  Filled 2023-02-10 (×2): qty 1

## 2023-02-10 MED ORDER — COLCHICINE 0.6 MG PO TABS
0.6000 mg | ORAL_TABLET | Freq: Two times a day (BID) | ORAL | Status: DC
  Administered 2023-02-10 – 2023-02-11 (×2): 0.6 mg via ORAL
  Filled 2023-02-10 (×2): qty 1

## 2023-02-10 MED ORDER — SODIUM CHLORIDE 0.9 % IV SOLN: 250.0000 mL | INTRAVENOUS | Status: AC | PRN

## 2023-02-10 NOTE — H&P (Signed)
History and Physical    Patient: Kevin Goodman NAT:[id number] DOB: 05-17-1950 DOA: 02/10/2023 DOS: the patient was seen and examined on 02/10/2023 PCP: Benetta Spar, MD  Patient coming from: Home  Chief Complaint:  Chief Complaint  Patient presents with   Bradycardia   HPI: Kevin Goodman is a 72 y.o. male with medical history significant of atrial fibrillation, gastroesophageal flux disease, chronic kidney disease stage IIIa, type 2 diabetes with nephropathy, hypertension, morbid obesity, sleep apnea and history of gout; who presented to the hospital secondary to bradycardia.  Patient reports feeling short of breath and slightly lightheaded.  Patient reports symptoms lasted approximately 10 to 20 minutes; no chest pain, no nausea, no vomiting.  Pulse oximetry check at work demonstrated heart rate in the 30s to 40s and EMS was called.  In the ED for the most part patient symptoms completely resolved but heart rate remains in the 40s.  Patient expressed nonintentional he taking double dosage of the medications that he is supposed to use (including his diltiazem and metoprolol.  Case discussed with poison control who has recommended close monitoring of patient heart rate for the next 24 hours.    Review of Systems: As mentioned in the history of present illness. All other systems reviewed and are negative. Past Medical History:  Diagnosis Date   Atrial fibrillation (HCC)    a. initialy occurring in 2013  b. recurrence in 06/2020 in the setting of COVID-19 --> s/p DCCV in 07/2020   Chronic kidney disease, stage 3a (HCC)    Diabetes mellitus, type 2 (HCC)    Dysrhythmia    GERD (gastroesophageal reflux disease)    Gout    Hypertension    Mild pulmonary hypertension (HCC)    Morbid obesity (HCC)    OSA (obstructive sleep apnea)    Paroxysmal atrial flutter (HCC)    Sleep apnea    Past Surgical History:  Procedure Laterality Date   BALLOON DILATION N/A 12/25/2020    Procedure: BALLOON DILATION;  Surgeon: Lanelle Bal, DO;  [facility];  Service: Endoscopy;  Laterality: N/A;   BIOPSY  12/25/2020   Procedure: BIOPSY;  Surgeon: Lanelle Bal, DO;  [facility];  Service: Endoscopy;;   BIOPSY  08/05/2022   Procedure: BIOPSY;  Surgeon: Lanelle Bal, DO;  [facility];  Service: Endoscopy;;   CARDIOVERSION N/A 07/09/2020   Procedure: CARDIOVERSION;  Surgeon: Antoine Poche, MD;  [facility];  Service: Endoscopy;  Laterality: N/A;   COLONOSCOPY N/A 12/08/2016   sigmoid and descending colon diverticula, four semi-pedunculated polyps in descending and ascendign colon, few polyps in descending and ascending colon (tubular adenomas). 3 year surveillance.    COLONOSCOPY WITH PROPOFOL N/A 12/25/2020   Surgeon: Earnest Bailey K, DO; Nonbleeding internal hemorrhoids, pancolonic diverticulosis, 2 mm tubular adenoma.  Recommended repeat in 5 years.   COLONOSCOPY WITH PROPOFOL N/A 08/05/2022   Procedure: COLONOSCOPY WITH PROPOFOL;  Surgeon: Lanelle Bal, DO;  [facility];  Service: Endoscopy;  Laterality: N/A;  7:30 am, ASA 3   ESOPHAGOGASTRODUODENOSCOPY N/A 12/08/2016   normal esophagus s/p empiric dilation, small hiatal hernia.    ESOPHAGOGASTRODUODENOSCOPY (EGD) WITH PROPOFOL N/A 12/25/2020   Surgeon: Lanelle Bal, DO;  Normal esophagus s/p empiric dilation, gastritis biopsied, normal examined duodenum.  Pathology with slight chronic inflammation, no H. pylori.   ESOPHAGOGASTRODUODENOSCOPY (EGD) WITH PROPOFOL N/A 08/05/2022   Procedure: ESOPHAGOGASTRODUODENOSCOPY (EGD) WITH PROPOFOL;  Surgeon: Earnest Bailey  K, DO;  [facility];  Service: Endoscopy;  Laterality: N/A;   FINGER SURGERY     left ring   INCISION AND DRAINAGE OF WOUND Left 04/20/2016   Procedure: LEFT THIGH DEBRIDEMENT;  Surgeon: Rodman Pickle, MD;  Location: [facility] OR;  Service: General;  Laterality: Left;   MALONEY  DILATION N/A 12/08/2016   Procedure: Alvy Beal;  Surgeon: Corbin Ade, MD;  [facility];  Service: Endoscopy;  Laterality: N/A;   POLYPECTOMY  12/08/2016   Procedure: POLYPECTOMY;  Surgeon: Corbin Ade, MD;  [facility];  Service: Endoscopy;;  colon   POLYPECTOMY  12/25/2020   Procedure: POLYPECTOMY;  Surgeon: Lanelle Bal, DO;  [facility];  Service: Endoscopy;;   POLYPECTOMY  08/05/2022   Procedure: POLYPECTOMY;  Surgeon: Lanelle Bal, DO;  [facility];  Service: Endoscopy;;   Social History:  reports that he has been smoking cigars. He has never used smokeless tobacco. He reports that he does not currently use alcohol. He reports that he does not use drugs.  No Known Allergies  Family History  Problem Relation Age of Onset   Heart failure Mother    Hypertension Mother    Hypertension Father    Stroke Father    Prostate cancer Father    Stroke Brother    Prostate cancer Brother    Colon cancer Neg Hx    Colon polyps Neg Hx     Prior to Admission medications   Medication Sig Start Date End Date Taking? Authorizing Provider  acetaminophen (TYLENOL) 500 MG tablet Take 1,000 mg by mouth every 6 (six) hours as needed for moderate pain.   Yes [provider]  allopurinol (ZYLOPRIM) 100 MG tablet Take 100 mg by mouth daily. 09/17/22  Yes [provider]  apixaban (ELIQUIS) 5 MG TABS tablet Take 1 tablet (5 mg total) by mouth 2 (two) times daily. 06/19/21  Yes Johnson, Clanford L, MD  atorvastatin (LIPITOR) 20 MG tablet Take 20 mg by mouth daily.   Yes [provider]  Colchicine 0.6 MG CAPS Take 1 capsule by mouth 2 (two) times daily. 09/06/22  Yes [provider]  dapagliflozin propanediol (FARXIGA) 5 MG TABS tablet Take 5 mg by mouth daily. 04/30/22 04/30/23 Yes [provider]  diltiazem (TIAZAC) 360 MG 24 hr capsule Take 360 mg by mouth daily.   Yes [provider]   ferrous sulfate 325 (65 FE) MG EC tablet Take 325 mg by mouth daily. 04/30/22 04/30/23 Yes [provider]  furosemide (LASIX) 40 MG tablet Take 40 mg by mouth 2 (two) times daily.   Yes [provider]  hydrALAZINE (APRESOLINE) 100 MG tablet Take 100 mg by mouth 2 (two) times daily.   Yes [provider]  Insulin Degludec FlexTouch 100 UNIT/ML SOPN SMARTSIG:40 Unit(s) SUB-Q Every Night 09/21/22  Yes [provider]  LANTUS SOLOSTAR 100 UNIT/ML Solostar Pen Inject 40 Units into the skin at bedtime. 05/18/22  Yes [provider]  losartan (COZAAR) 100 MG tablet Take 1 tablet (100 mg total) by mouth daily. 10/27/20  Yes Strader, [place] M, PA-C  metoprolol tartrate (LOPRESSOR) 50 MG tablet Take 1 tablet (50 mg total) by mouth 2 (two) times daily. 06/19/21  Yes Johnson, Clanford L, MD  oxymetazoline (AFRIN) 0.05 % nasal spray Place 1 spray into both nostrils 2 (two) times daily as needed for congestion.   Yes [provider]  pantoprazole (  PROTONIX) 40 MG tablet Take 1 tablet (40 mg total) by mouth 2 (two) times daily before a meal. 30 minutes before breakfast 12/25/20 02/10/23 Yes Carver, Charles K, DO  TRADJENTA 5 MG TABS tablet Take 5 mg by mouth daily.   Yes [provider]    Physical Exam: Vitals:   02/10/23 1430 02/10/23 1530 02/10/23 1600 02/10/23 1700  BP: 127/81 138/79 119/65 (!) 171/91  Pulse: (!) 53 (!) 56 (!) 53 (!) 55  Resp: 16 12 18 19   Temp:    97.8 F (36.6 C)  TempSrc:    Oral  SpO2: 95% 94% 97% 97%  Weight:      Height:       General exam: Alert, awake, oriented x 3; in no acute distress. Respiratory system: Clear to auscultation. Respiratory effort normal.  Good saturation on room air. Cardiovascular system: Sinus bradycardia; no rubs or gallops. Gastrointestinal system: Abdomen is obese, nondistended, soft and nontender. No organomegaly or masses felt. Normal bowel sounds heard. Central nervous system: No focal  neurological deficits. Extremities: No cyanosis or clubbing; chronic lower extremity edema appreciated (unchanged). Skin: No petechiae. Psychiatry: Judgement and insight appear normal. Mood & affect appropriate.   Data Reviewed: Comprehensive metabolic panel: Sodium 136, potassium 4.3, chloride 104, bicarb 19, glucose 174, BUN 36, creatinine 2.02, stable LFTs; GFR 35. BNP: 489 (2 years ago 410). Troponin: 6>>>5   Assessment and Plan: 1-bradycardia/atrial fibrillation -Appears to be associated with consumption of extra AV blocking medication -Admitted to telemetry -Holding Cardizem and metoprolol overnight -Will check TSH and closely follow electrolytes -Continue supportive care. -Patient hemodynamically stable. -Continue treatment with Eliquis.  2-hypertension -Continue the rest of patient's antihypertensive agents (holding Cardizem and metoprolol) -Healthy diet discussed with patient -Follow vital signs.  3-hyperlipidemia -Continue Lipitor.  4-type 2 diabetes mellitus with nephropathy -Continue treatment with Farxiga -Will order sliding scale insulin and Semglee. -Update A1c. -Modified carbohydrate diet discussed with patient.  5-gastroesophageal reflux disease -Continue Protonix  6-history of gout -Continue treatment with colchicine and allopurinol -No acute flare currently appreciated.  7-morbid obesity -Body mass index is 52.47 kg/m. -Low-calorie diet, portion control and increase physical activity discussed with patient.  8-chronic kidney disease stage IIIb -Appears to be stable and at baseline -Continue minimizing nephrotoxic agent -Maintain adequate hydration -Follow renal function trend and electrolytes stability.    Advance Care Planning:   Code Status: Full Code   Consults: Poison control by EDP.  Family Communication: No family at bedside.  Severity of Illness: The appropriate patient status for this patient is OBSERVATION. Observation status is  judged to be reasonable and necessary in order to provide the required intensity of service to ensure the patient's safety. The patient's presenting symptoms, physical exam findings, and initial radiographic and laboratory data in the context of their medical condition is felt to place them at decreased risk for further clinical deterioration. Furthermore, it is anticipated that the patient will be medically stable for discharge from the hospital within 2 midnights of admission.   Author: Vassie Loll, MD 02/10/2023 6:54 PM  For on call review [email or web address].

## 2023-02-10 NOTE — ED Triage Notes (Signed)
Pt BIB ems for bradycardia from a-fib. Pt has hx of uncontrolled a-fib but normally 180-200s. Pt states having left chest wall pain "feels like indigestion."

## 2023-02-10 NOTE — ED Provider Notes (Signed)
[place] EMERGENCY DEPARTMENT AT [facility] Provider Note   CSN: [id number] Arrival date & time: 02/10/23  7564     History  Chief Complaint  Patient presents with   Bradycardia    Kevin Goodman is a 72 y.o. male.  HPI 72 year old male presents with dyspnea and bradycardia. He felt fine this morning, but when he walked into work he noticed he was short of breath. Overall lasted about 10 minutes. No chest pain, pressure or burning. No dizziness or lightheadedness. Symptoms are gone now. However, when the pulse ox was on his finger with the nurse at work, he was noted to have heart rates in the 40s and 50s. EMS was called. No recent illness or change in medications. Doesn't think he has accidentally taken any extra meds. No new leg swelling (has chronic left leg swelling). States he's felt this way in the past, but usually it's because his heart rate is high, not low.  Home Medications Prior to Admission medications   Medication Sig Start Date End Date Taking? Authorizing Provider  acetaminophen (TYLENOL) 500 MG tablet Take 1,000 mg by mouth every 6 (six) hours as needed for moderate pain.    [provider]  allopurinol (ZYLOPRIM) 100 MG tablet Take 100 mg by mouth daily. 09/17/22   [provider]  apixaban (ELIQUIS) 5 MG TABS tablet Take 1 tablet (5 mg total) by mouth 2 (two) times daily. 06/19/21   Johnson, Clanford L, MD  atorvastatin (LIPITOR) 20 MG tablet Take 20 mg by mouth daily.    [provider]  ciprofloxacin (CIPRO) 250 MG tablet SMARTSIG:1 Tablet(s) By Mouth Every 12 Hours 09/17/22   [provider]  Colchicine 0.6 MG CAPS Take 1 capsule by mouth 2 (two) times daily. 09/06/22   [provider]  dapagliflozin propanediol (FARXIGA) 5 MG TABS tablet Take 5 mg by mouth daily. 04/30/22 04/30/23  [provider]  diltiazem (TIAZAC) 360 MG 24 hr capsule Take 360 mg by mouth daily.    [provider]  ferrous  sulfate 325 (65 FE) MG EC tablet Take 325 mg by mouth daily. 04/30/22 04/30/23  [provider]  furosemide (LASIX) 40 MG tablet Take 40 mg by mouth 2 (two) times daily.    [provider]  hydrALAZINE (APRESOLINE) 100 MG tablet Take 100 mg by mouth 2 (two) times daily.    [provider]  Insulin Degludec FlexTouch 100 UNIT/ML SOPN SMARTSIG:40 Unit(s) SUB-Q Every Night 09/21/22   [provider]  LANTUS SOLOSTAR 100 UNIT/ML Solostar Pen Inject 40 Units into the skin at bedtime. 05/18/22   [provider]  losartan (COZAAR) 100 MG tablet Take 1 tablet (100 mg total) by mouth daily. 10/27/20   [place] Ouch, Lennart Pall, PA-C  metoprolol tartrate (LOPRESSOR) 50 MG tablet Take 1 tablet (50 mg total) by mouth 2 (two) times daily. 06/19/21   Johnson, Clanford L, MD  oxymetazoline (AFRIN) 0.05 % nasal spray Place 1 spray into both nostrils 2 (two) times daily as needed for congestion.    [provider]  pantoprazole (PROTONIX) 40 MG tablet Take 1 tablet (40 mg total) by mouth 2 (two) times daily before a meal. 30 minutes before breakfast 12/25/20 01/05/23  Carver, Charles K, DO  TRADJENTA 5 MG TABS tablet Take 5 mg by mouth daily.    [provider]      Allergies    Patient has no known allergies.    Review of  Systems   Review of Systems  Constitutional:  Negative for fever.  Respiratory:  Positive for shortness of breath. Negative for cough.   Cardiovascular:  Positive for leg swelling (chronic, unchanged). Negative for chest pain.  Gastrointestinal:  Negative for abdominal distention and abdominal pain.  Neurological:  Negative for dizziness and light-headedness.    Physical Exam Updated Vital Signs BP 104/63   Pulse (!) 47   Temp 98.2 F (36.8 C) (Oral)   Resp 20   Ht 6\' 1"  (1.854 m)   Wt (!) 180.4 kg   SpO2 94%   BMI 52.47 kg/m  Physical Exam Vitals and nursing note reviewed.  Constitutional:      General: He is not in acute  distress.    Appearance: He is well-developed. He is obese. He is not ill-appearing or diaphoretic.  HENT:     Head: Normocephalic and atraumatic.  Cardiovascular:     Rate and Rhythm: Regular rhythm. Bradycardia present.     Heart sounds: Normal heart sounds.  Pulmonary:     Effort: Pulmonary effort is normal.     Breath sounds: Normal breath sounds. No wheezing.  Abdominal:     General: There is no distension.     Palpations: Abdomen is soft.     Tenderness: There is no abdominal tenderness.  Musculoskeletal:     Comments: There is trace non pitting ankle swelling to right lower leg. Left lower leg is swollen but baseline per patient, and always unilaterally swollen.  Skin:    General: Skin is warm and dry.  Neurological:     Mental Status: He is alert.     ED Results / Procedures / Treatments   Labs (all labs ordered are listed, but only abnormal results are displayed) Labs Reviewed  COMPREHENSIVE METABOLIC PANEL - Abnormal; Notable for the following components:      Result Value   CO2 19 (*)    Glucose, Bld 174 (*)    BUN 36 (*)    Creatinine, Ser 2.02 (*)    AST 108 (*)    ALT 106 (*)    GFR, Estimated 35 (*)    All other components within normal limits  BRAIN NATRIURETIC PEPTIDE - Abnormal; Notable for the following components:   B Natriuretic Peptide 489.0 (*)    All other components within normal limits  CBC WITH DIFFERENTIAL/PLATELET - Abnormal; Notable for the following components:   WBC 14.2 (*)    Neutro Abs 10.1 (*)    Abs Immature Granulocytes 0.11 (*)    All other components within normal limits  TSH - Abnormal; Notable for the following components:   TSH 6.353 (*)    All other components within normal limits  CBG MONITORING, ED - Abnormal; Notable for the following components:   Glucose-Capillary 186 (*)    All other components within normal limits  MAGNESIUM  HIV ANTIBODY (ROUTINE TESTING W REFLEX)  HEMOGLOBIN A1C  TROPONIN I (HIGH SENSITIVITY)   TROPONIN I (HIGH SENSITIVITY)    EKG EKG Interpretation Date/Time:  Thursday February 10 2023 08:16:24 EST Ventricular Rate:  50 PR Interval:    QRS Duration:  102 QT Interval:  468 QTC Calculation: 427 R Axis:   173  Text Interpretation: Atrial fibrillation Right axis deviation Low voltage, precordial leads Nonspecific T abnormalities, lateral leads no significant change since Oct 2024 Confirmed by Pricilla Loveless [phone]) on 02/10/2023 8:26:00 AM  Radiology DG Chest 2 View  Result Date: 02/10/2023 CLINICAL DATA:  Dyspnea. EXAM:  CHEST - 2 VIEW COMPARISON:  December 09, 2021. FINDINGS: The heart size and mediastinal contours are within normal limits. Both lungs are clear. The visualized skeletal structures are unremarkable. IMPRESSION: No active cardiopulmonary disease. Electronically Signed   By: Lupita Raider M.D.   On: 02/10/2023 09:58    Procedures Procedures    Medications Ordered in ED Medications - No data to display  ED Course/ Medical Decision Making/ A&P                                 Medical Decision Making Amount and/or Complexity of Data Reviewed Labs: ordered.    Details: Normal troponin.  CKD. Radiology: ordered and independent interpretation performed.    Details: No CHF ECG/medicine tests: ordered and independent interpretation performed.    Details: A-fib with bradycardia  Risk OTC drugs. Decision regarding hospitalization.   Patient has done well while in the ED.  Feels asymptomatic.  Has had some heart rates in the 40s but again is asymptomatic.  At first he told me he did not take any extra meds but when he was thinking about it later he told me that he thinks he took his daily meds late yesterday in the evening (missed the morning dose) and thinks when he woke up from a nap around 10 PM he took another course which would include his diltiazem.  Due to this, discussed with poison control.  He needs to be observed about 18 hours after with his last  dose of evening medicine at 10 PM.  He also took a dose this morning.  However after 18 hours he should be back to his typical Cardizem amount in his bloodstream.  He has not needed any type of inotropic or pressor support but he needs to be monitored on the telemetry.  Does not seem like this was intentional.  Discussed with Dr. Gwenlyn Perking for admission.  Of note, he is not sure which of his meds he took besides what ever he takes typically and colchicine is on his list.  However he is only takes colchicine as needed and has not been taking it recently so I do not think there is a chance that this was an overdose as this would be more toxic.        Final Clinical Impression(s) / ED Diagnoses Final diagnoses:  Bradycardia  Accidental overdose, initial encounter    Rx / DC Orders ED Discharge Orders     None         Pricilla Loveless, MD 02/10/23 1329

## 2023-02-11 DIAGNOSIS — I10 Essential (primary) hypertension: Secondary | ICD-10-CM

## 2023-02-11 DIAGNOSIS — T50901A Poisoning by unspecified drugs, medicaments and biological substances, accidental (unintentional), initial encounter: Secondary | ICD-10-CM | POA: Diagnosis not present

## 2023-02-11 DIAGNOSIS — R001 Bradycardia, unspecified: Secondary | ICD-10-CM | POA: Diagnosis not present

## 2023-02-11 DIAGNOSIS — E1121 Type 2 diabetes mellitus with diabetic nephropathy: Secondary | ICD-10-CM

## 2023-02-11 DIAGNOSIS — K219 Gastro-esophageal reflux disease without esophagitis: Secondary | ICD-10-CM

## 2023-02-11 LAB — BASIC METABOLIC PANEL
Anion gap: 9 (ref 5–15)
BUN: 29 mg/dL — ABNORMAL HIGH (ref 8–23)
CO2: 23 mmol/L (ref 22–32)
Calcium: 9 mg/dL (ref 8.9–10.3)
Chloride: 106 mmol/L (ref 98–111)
Creatinine, Ser: 1.61 mg/dL — ABNORMAL HIGH (ref 0.61–1.24)
GFR, Estimated: 46 mL/min — ABNORMAL LOW (ref >60)
Glucose, Bld: 129 mg/dL — ABNORMAL HIGH (ref 70–99)
Potassium: 3.7 mmol/L (ref 3.5–5.1)
Sodium: 138 mmol/L (ref 135–145)

## 2023-02-11 LAB — GLUCOSE, CAPILLARY
Glucose-Capillary: 134 mg/dL — ABNORMAL HIGH (ref 70–99)
Glucose-Capillary: 161 mg/dL — ABNORMAL HIGH (ref 70–99)

## 2023-02-11 MED ORDER — DILTIAZEM HCL ER BEADS 360 MG PO CP24: 360.0000 mg | ORAL_CAPSULE | Freq: Every day | ORAL | Status: AC

## 2023-02-11 MED ORDER — METOPROLOL TARTRATE 50 MG PO TABS: 50.0000 mg | ORAL_TABLET | Freq: Two times a day (BID) | ORAL | Status: AC

## 2023-02-11 NOTE — Progress Notes (Signed)
   02/11/23 1429  TOC Brief Assessment  Insurance and Status Reviewed  Patient has primary care physician Yes  Home environment has been reviewed Private  Prior level of function: Independent  Prior/Current Home Services No current home services  Social Determinants of Health Reivew SDOH reviewed no interventions necessary  Readmission risk has been reviewed Yes  Transition of care needs no transition of care needs at this time

## 2023-02-11 NOTE — Discharge Summary (Signed)
Physician Discharge Summary   Patient: Kevin Goodman MRN: [id number] DOB: [birth year]  Admit date:     02/10/2023  Discharge date: 02/11/23  Discharge Physician: Vassie Loll   PCP: Benetta Spar, MD   Recommendations at discharge:  Repeat basic metabolic panel to follow ultralights renal function Continue assisting patient with weight loss management Reassess blood pressure and adjust antihypertensive treatment as required   Discharge Diagnoses: Principal Problem:   Bradycardia Active Problems:   Obesity, Class III, BMI 40-49.9 (morbid obesity) (HCC)   Type 2 diabetes with nephropathy (HCC)   Accidental overdose Chronic kidney disease stage IIIb History of gout Hyperlipidemia   Brief Hospital admission course: Kevin Goodman is a 72 y.o. male with medical history significant of atrial fibrillation, gastroesophageal flux disease, chronic kidney disease stage IIIa, type 2 diabetes with nephropathy, hypertension, morbid obesity, sleep apnea and history of gout; who presented to the hospital secondary to bradycardia.  Patient reports feeling short of breath and slightly lightheaded.  Patient reports symptoms lasted approximately 10 to 20 minutes; no chest pain, no nausea, no vomiting.  Pulse oximetry check at work demonstrated heart rate in the 30s to 40s and EMS was called.  In the ED for the most part patient symptoms completely resolved but heart rate remains in the 40s.  Patient expressed nonintentional he taking double dosage of the medications that he is supposed to use (including his diltiazem and metoprolol.   Case discussed with poison control who has recommended close monitoring of patient heart rate for the next 24 hours.    Assessment and Plan: 1-bradycardia/atrial fibrillation -Patient developed symptoms after taking extra AV blocking medication (nonintentional). -Patient's heart rate has stabilized; telemetry demonstrating atrial fibrillation -Patient  instructed/educated about adequate medication usage and compliance -Safe to resume home Cardizem and metoprolol on 02/12/2023 -Continue Eliquis for secondary prevention. -TSH and electrolytes stable for the most part -Continue patient follow-up with PCP/cardiology service.  2-essential hypertension -Resume home antihypertensive agents -Heart healthy/low-sodium diet discussed with patient -Continue to follow vital signs.  3-hyperlipidemia -Continue statin.  4-type 2 diabetes mellitus with nephropathy -Resume home hypoglycemic regimen -Modified carbohydrate diet and adequate hydration discussed with patient -A1c 8.4 -Continue outpatient follow-up of patient's CBGs fluctuation/A1c with further adjustment to hypoglycemic regimen as required.  5-gastroesophageal flux disease -Continue PPI.  6-history of gout -No acute flare currently appreciated -Continue treatment with colchicine and allopurinol.  7-morbid obesity -Body mass index is 52.09 kg/m. -Low-calorie diet, portion control and increase physical activity discussed with patient.  8-chronic kidney disease stage IIIb -Appears to be stable and at baseline -Continue close monitoring of renal function Maintain adequate hydration Continue minimizing nephrotoxic agents.   Consultants: None Procedures performed: See below for x-ray reports. Disposition: Home Diet recommendation: Heart healthy/modified carbohydrate diet.  Low calorie diet also discussed with patient.  DISCHARGE MEDICATION: Allergies as of 02/11/2023   No Known Allergies      Medication List     TAKE these medications    acetaminophen 500 MG tablet Commonly known as: TYLENOL Take 1,000 mg by mouth every 6 (six) hours as needed for moderate pain.   allopurinol 100 MG tablet Commonly known as: ZYLOPRIM Take 100 mg by mouth daily.   apixaban 5 MG Tabs tablet Commonly known as: ELIQUIS Take 1 tablet (5 mg total) by mouth 2 (two) times daily.    atorvastatin 20 MG tablet Commonly known as: LIPITOR Take 20 mg by mouth daily.   Colchicine 0.6 MG Caps Take 1  capsule by mouth 2 (two) times daily.   dapagliflozin propanediol 5 MG Tabs tablet Commonly known as: FARXIGA Take 5 mg by mouth daily.   diltiazem 360 MG 24 hr capsule Commonly known as: TIAZAC Take 1 capsule (360 mg total) by mouth daily. Start taking on: February 12, 2023   ferrous sulfate 325 (65 FE) MG EC tablet Take 325 mg by mouth daily.   furosemide 40 MG tablet Commonly known as: LASIX Take 40 mg by mouth 2 (two) times daily.   hydrALAZINE 100 MG tablet Commonly known as: APRESOLINE Take 100 mg by mouth 2 (two) times daily.   Insulin Degludec FlexTouch 100 UNIT/ML Sopn SMARTSIG:40 Unit(s) SUB-Q Every Night   Lantus SoloStar 100 UNIT/ML Solostar Pen Generic drug: insulin glargine Inject 40 Units into the skin at bedtime.   losartan 100 MG tablet Commonly known as: COZAAR Take 1 tablet (100 mg total) by mouth daily.   metoprolol tartrate 50 MG tablet Commonly known as: LOPRESSOR Take 1 tablet (50 mg total) by mouth 2 (two) times daily. Start taking on: February 12, 2023   oxymetazoline 0.05 % nasal spray Commonly known as: AFRIN Place 1 spray into both nostrils 2 (two) times daily as needed for congestion.   pantoprazole 40 MG tablet Commonly known as: PROTONIX Take 1 tablet (40 mg total) by mouth 2 (two) times daily before a meal. 30 minutes before breakfast   Tradjenta 5 MG Tabs tablet Generic drug: linagliptin Take 5 mg by mouth daily.        Follow-up Information     Fanta, Wayland Salinas, MD. Schedule an appointment as soon as possible for a visit in 10 day(s).   Specialty: Internal Medicine Contact information: [address] [place] [address] [phone]                Discharge Exam: Ceasar Mons Weights   02/10/23 2130 02/11/23 0500  Weight: (!) 180.4 kg (!) 179.1 kg   General exam: Alert, awake,  oriented x 3; no acute distress and feeling ready to go home. Respiratory system: Clear to auscultation. Respiratory effort normal. Cardiovascular system: Irregular, no rubs, no gallops, unable to assess JVD with body habitus.  Rate controlled. Gastrointestinal system: Abdomen is obese, nondistended, soft and nontender. No organomegaly or masses felt. Normal bowel sounds heard. Central nervous system: Alert and oriented. No focal neurological deficits. Extremities: No cyanosis or clubbing. Skin: No petechiae. Psychiatry: Judgement and insight appear normal. Mood & affect appropriate.    Condition at discharge: Stable and improved.  The results of significant diagnostics from this hospitalization (including imaging, microbiology, ancillary and laboratory) are listed below for reference.   Imaging Studies: DG Chest 2 View  Result Date: 02/10/2023 CLINICAL DATA:  Dyspnea. EXAM: CHEST - 2 VIEW COMPARISON:  December 09, 2021. FINDINGS: The heart size and mediastinal contours are within normal limits. Both lungs are clear. The visualized skeletal structures are unremarkable. IMPRESSION: No active cardiopulmonary disease. Electronically Signed   By: Lupita Raider M.D.   On: 02/10/2023 09:58    Microbiology: Results for orders placed or performed during the hospital encounter of 12/08/21  Resp Panel by RT-PCR (Flu A&B, Covid) Anterior Nasal Swab     Status: None   Collection Time: 12/09/21  3:11 AM   Specimen: Anterior Nasal Swab  Result Value Ref Range Status   SARS Coronavirus 2 by RT PCR NEGATIVE NEGATIVE Final    Comment: (NOTE) SARS-CoV-2 target nucleic acids are NOT DETECTED.  The SARS-CoV-2  RNA is generally detectable in upper respiratory specimens during the acute phase of infection. The lowest concentration of SARS-CoV-2 viral copies this assay can detect is 138 copies/mL. A negative result does not preclude SARS-Cov-2 infection and should not be used as the sole basis for treatment  or other patient management decisions. A negative result may occur with  improper specimen collection/handling, submission of specimen other than nasopharyngeal swab, presence of viral mutation(s) within the areas targeted by this assay, and inadequate number of viral copies(<138 copies/mL). A negative result must be combined with clinical observations, patient history, and epidemiological information. The expected result is Negative.  Fact Sheet for Patients:  BloggerCourse.com  Fact Sheet for Healthcare Providers:  SeriousBroker.it  This test is no t yet approved or cleared by the [place] FDA and  has been authorized for detection and/or diagnosis of SARS-CoV-2 by FDA under an Emergency Use Authorization (EUA). This EUA will remain  in effect (meaning this test can be used) for the duration of the COVID-19 declaration under Section 564(b)(1) of the Act, 21 U.S.C.section 360bbb-3(b)(1), unless the authorization is terminated  or revoked sooner.       Influenza A by PCR NEGATIVE NEGATIVE Final   Influenza B by PCR NEGATIVE NEGATIVE Final    Comment: (NOTE) The Xpert Xpress SARS-CoV-2/FLU/RSV plus assay is intended as an aid in the diagnosis of influenza from Nasopharyngeal swab specimens and should not be used as a sole basis for treatment. Nasal washings and aspirates are unacceptable for Xpert Xpress SARS-CoV-2/FLU/RSV testing.  Fact Sheet for Patients: BloggerCourse.com  Fact Sheet for Healthcare Providers: SeriousBroker.it  This test is not yet approved or cleared by the [place] FDA and has been authorized for detection and/or diagnosis of SARS-CoV-2 by FDA under an Emergency Use Authorization (EUA). This EUA will remain in effect (meaning this test can be used) for the duration of the COVID-19 declaration under Section 564(b)(1) of the Act, 21 U.S.C. section  360bbb-3(b)(1), unless the authorization is terminated or revoked.  Performed at [facility], [address] [place], [place] [address]   Urine Culture     Status: Abnormal   Collection Time: 12/09/21  3:14 AM   Specimen: Urine, Clean Catch  Result Value Ref Range Status   Specimen Description URINE, CLEAN CATCH  Final   Special Requests   Final    NONE Performed at [facility], [address], [place], [place] [address]    Culture >=100,000 COLONIES/mL ENTEROBACTER CLOACAE (A)  Final   Report Status 12/11/2021 FINAL  Final   Organism ID, Bacteria ENTEROBACTER CLOACAE (A)  Final      Susceptibility   Enterobacter cloacae - MIC*    CEFAZOLIN >=64 RESISTANT Resistant     CEFEPIME <=0.12 SENSITIVE Sensitive     CIPROFLOXACIN <=0.25 SENSITIVE Sensitive     GENTAMICIN <=1 SENSITIVE Sensitive     IMIPENEM <=0.25 SENSITIVE Sensitive     NITROFURANTOIN <=16 SENSITIVE Sensitive     TRIMETH/SULFA <=20 SENSITIVE Sensitive     PIP/TAZO <=4 SENSITIVE Sensitive     * >=100,000 COLONIES/mL ENTEROBACTER CLOACAE    Labs: CBC: Recent Labs  Lab 02/10/23 0845  WBC 14.2*  NEUTROABS 10.1*  HGB 13.8  HCT 43.7  MCV 95.4  PLT 248   Basic Metabolic Panel: Recent Labs  Lab 02/10/23 0845 02/10/23 1120 02/11/23 0412  NA 136  --  138  K 4.3  --  3.7  CL 104  --  106  CO2 19*  --  23  GLUCOSE 174*  --  129*  BUN 36*  --  29*  CREATININE 2.02*  --  1.61*  CALCIUM 8.9  --  9.0  MG  --  2.1  --    Liver Function Tests: Recent Labs  Lab 02/10/23 0845  AST 108*  ALT 106*  ALKPHOS 102  BILITOT 0.6  PROT 6.6  ALBUMIN 3.6   CBG: Recent Labs  Lab 02/10/23 1245 02/10/23 1807 02/10/23 2041 02/11/23 0712 02/11/23 1124  GLUCAP 186* 122* 148* 134* 161*    Discharge time spent: greater than 30 minutes.  Signed: Vassie Loll, MD [facility] 02/11/2023

## 2023-02-11 NOTE — Progress Notes (Signed)
Nsg Discharge Note  Admit Date:  02/10/2023 Discharge date: 02/11/2023   Bertell Maria to be D/C'd Home per MD order.  AVS completed.  Copy for chart, and copy for patient signed, and dated. Patient/caregiver able to verbalize understanding.  Discharge Medication: Allergies as of 02/11/2023   No Known Allergies      Medication List     TAKE these medications    acetaminophen 500 MG tablet Commonly known as: TYLENOL Take 1,000 mg by mouth every 6 (six) hours as needed for moderate pain.   allopurinol 100 MG tablet Commonly known as: ZYLOPRIM Take 100 mg by mouth daily.   apixaban 5 MG Tabs tablet Commonly known as: ELIQUIS Take 1 tablet (5 mg total) by mouth 2 (two) times daily.   atorvastatin 20 MG tablet Commonly known as: LIPITOR Take 20 mg by mouth daily.   Colchicine 0.6 MG Caps Take 1 capsule by mouth 2 (two) times daily.   dapagliflozin propanediol 5 MG Tabs tablet Commonly known as: FARXIGA Take 5 mg by mouth daily.   diltiazem 360 MG 24 hr capsule Commonly known as: TIAZAC Take 1 capsule (360 mg total) by mouth daily. Start taking on: February 12, 2023   ferrous sulfate 325 (65 FE) MG EC tablet Take 325 mg by mouth daily.   furosemide 40 MG tablet Commonly known as: LASIX Take 40 mg by mouth 2 (two) times daily.   hydrALAZINE 100 MG tablet Commonly known as: APRESOLINE Take 100 mg by mouth 2 (two) times daily.   Insulin Degludec FlexTouch 100 UNIT/ML Sopn SMARTSIG:40 Unit(s) SUB-Q Every Night   Lantus SoloStar 100 UNIT/ML Solostar Pen Generic drug: insulin glargine Inject 40 Units into the skin at bedtime.   losartan 100 MG tablet Commonly known as: COZAAR Take 1 tablet (100 mg total) by mouth daily.   metoprolol tartrate 50 MG tablet Commonly known as: LOPRESSOR Take 1 tablet (50 mg total) by mouth 2 (two) times daily. Start taking on: February 12, 2023   oxymetazoline 0.05 % nasal spray Commonly known as: AFRIN Place 1 spray into  both nostrils 2 (two) times daily as needed for congestion.   pantoprazole 40 MG tablet Commonly known as: PROTONIX Take 1 tablet (40 mg total) by mouth 2 (two) times daily before a meal. 30 minutes before breakfast   Tradjenta 5 MG Tabs tablet Generic drug: linagliptin Take 5 mg by mouth daily.        Discharge Assessment: Vitals:   02/11/23 0830 02/11/23 1356  BP: 126/61 139/77  Pulse:  82  Resp:  20  Temp:  98.7 F (37.1 C)  SpO2:  99%   Skin clean, dry and intact without evidence of skin break down, no evidence of skin tears noted. IV catheter discontinued intact. Site without signs and symptoms of complications - no redness or edema noted at insertion site, patient denies c/o pain - only slight tenderness at site.  Dressing with slight pressure applied.  D/c Instructions-Education: Discharge instructions given to patient/family with verbalized understanding. D/c education completed with patient/family including follow up instructions, medication list, d/c activities limitations if indicated, with other d/c instructions as indicated by MD - patient able to verbalize understanding, all questions fully answered. Patient instructed to return to ED, call 911, or call MD for any changes in condition.  Patient escorted via WC, and D/C home via private auto.  Demetrio Lapping, LPN 52/10/4130 4:40 PM

## 2023-02-11 NOTE — Care Management Obs Status (Signed)
MEDICARE OBSERVATION STATUS NOTIFICATION   Patient Details  Name: Kevin Goodman MRN: [id number] Date of Birth: June 19, 1950   Medicare Observation Status Notification Given:  Yes    Corey Harold 02/11/2023, 11:41 AM

## 2023-02-22 DIAGNOSIS — I10 Essential (primary) hypertension: Secondary | ICD-10-CM | POA: Diagnosis not present

## 2023-02-22 DIAGNOSIS — E1165 Type 2 diabetes mellitus with hyperglycemia: Secondary | ICD-10-CM | POA: Diagnosis not present

## 2023-03-24 ENCOUNTER — Ambulatory Visit (HOSPITAL_COMMUNITY)
Admission: RE | Admit: 2023-03-24 | Discharge: 2023-03-24 | Disposition: A | Discharge status: Home / Self Care | Payer: Medicare PPO | Source: Ambulatory Visit | Attending: Hematology | Admitting: Hematology

## 2023-03-24 ENCOUNTER — Other Ambulatory Visit: Payer: Self-pay | Admitting: Hematology

## 2023-03-24 ENCOUNTER — Inpatient Hospital Stay: Payer: Medicare PPO | Attending: Hematology

## 2023-03-24 DIAGNOSIS — D472 Monoclonal gammopathy: Secondary | ICD-10-CM | POA: Diagnosis not present

## 2023-03-24 DIAGNOSIS — I10 Essential (primary) hypertension: Secondary | ICD-10-CM | POA: Diagnosis not present

## 2023-03-24 DIAGNOSIS — M47814 Spondylosis without myelopathy or radiculopathy, thoracic region: Secondary | ICD-10-CM | POA: Diagnosis not present

## 2023-03-24 DIAGNOSIS — Z8719 Personal history of other diseases of the digestive system: Secondary | ICD-10-CM | POA: Insufficient documentation

## 2023-03-24 DIAGNOSIS — F17290 Nicotine dependence, other tobacco product, uncomplicated: Secondary | ICD-10-CM | POA: Insufficient documentation

## 2023-03-24 DIAGNOSIS — Z79899 Other long term (current) drug therapy: Secondary | ICD-10-CM | POA: Diagnosis not present

## 2023-03-24 DIAGNOSIS — M899 Disorder of bone, unspecified: Secondary | ICD-10-CM | POA: Insufficient documentation

## 2023-03-24 DIAGNOSIS — Z8042 Family history of malignant neoplasm of prostate: Secondary | ICD-10-CM | POA: Insufficient documentation

## 2023-03-24 DIAGNOSIS — E119 Type 2 diabetes mellitus without complications: Secondary | ICD-10-CM | POA: Insufficient documentation

## 2023-03-24 DIAGNOSIS — K219 Gastro-esophageal reflux disease without esophagitis: Secondary | ICD-10-CM | POA: Insufficient documentation

## 2023-03-24 DIAGNOSIS — R001 Bradycardia, unspecified: Secondary | ICD-10-CM | POA: Diagnosis not present

## 2023-03-24 DIAGNOSIS — Z8616 Personal history of COVID-19: Secondary | ICD-10-CM | POA: Insufficient documentation

## 2023-03-24 DIAGNOSIS — I4891 Unspecified atrial fibrillation: Secondary | ICD-10-CM | POA: Insufficient documentation

## 2023-03-24 DIAGNOSIS — Z8249 Family history of ischemic heart disease and other diseases of the circulatory system: Secondary | ICD-10-CM | POA: Insufficient documentation

## 2023-03-24 DIAGNOSIS — M898X9 Other specified disorders of bone, unspecified site: Secondary | ICD-10-CM

## 2023-03-24 DIAGNOSIS — Z823 Family history of stroke: Secondary | ICD-10-CM | POA: Diagnosis not present

## 2023-03-24 DIAGNOSIS — Z7901 Long term (current) use of anticoagulants: Secondary | ICD-10-CM | POA: Insufficient documentation

## 2023-03-24 DIAGNOSIS — Z860101 Personal history of adenomatous and serrated colon polyps: Secondary | ICD-10-CM | POA: Diagnosis not present

## 2023-03-24 DIAGNOSIS — M4185 Other forms of scoliosis, thoracolumbar region: Secondary | ICD-10-CM | POA: Diagnosis not present

## 2023-03-24 DIAGNOSIS — Z7984 Long term (current) use of oral hypoglycemic drugs: Secondary | ICD-10-CM | POA: Diagnosis not present

## 2023-03-24 LAB — CBC WITH DIFFERENTIAL/PLATELET
Abs Immature Granulocytes: 0.05 10*3/uL (ref 0.00–0.07)
Basophils Absolute: 0.1 10*3/uL (ref 0.0–0.1)
Basophils Relative: 1 %
Eosinophils Absolute: 0.2 10*3/uL (ref 0.0–0.5)
Eosinophils Relative: 2 %
HCT: 48.8 % (ref 39.0–52.0)
Hemoglobin: 16.1 g/dL (ref 13.0–17.0)
Immature Granulocytes: 0 %
Lymphocytes Relative: 46 %
Lymphs Abs: 5.3 10*3/uL — ABNORMAL HIGH (ref 0.7–4.0)
MCH: 30.4 pg (ref 26.0–34.0)
MCHC: 33 g/dL (ref 30.0–36.0)
MCV: 92.1 fL (ref 80.0–100.0)
Monocytes Absolute: 0.8 10*3/uL (ref 0.1–1.0)
Monocytes Relative: 7 %
Neutro Abs: 5 10*3/uL (ref 1.7–7.7)
Neutrophils Relative %: 44 %
Platelets: 307 10*3/uL (ref 150–400)
RBC: 5.3 MIL/uL (ref 4.22–5.81)
RDW: 15.4 % (ref 11.5–15.5)
WBC: 11.3 10*3/uL — ABNORMAL HIGH (ref 4.0–10.5)
nRBC: 0.3 % — ABNORMAL HIGH (ref 0.0–0.2)

## 2023-03-24 LAB — COMPREHENSIVE METABOLIC PANEL
ALT: 16 U/L (ref 0–44)
AST: 15 U/L (ref 15–41)
Albumin: 3.7 g/dL (ref 3.5–5.0)
Alkaline Phosphatase: 66 U/L (ref 38–126)
Anion gap: 11 (ref 5–15)
BUN: 25 mg/dL — ABNORMAL HIGH (ref 8–23)
CO2: 21 mmol/L — ABNORMAL LOW (ref 22–32)
Calcium: 9.6 mg/dL (ref 8.9–10.3)
Chloride: 105 mmol/L (ref 98–111)
Creatinine, Ser: 1.56 mg/dL — ABNORMAL HIGH (ref 0.61–1.24)
GFR, Estimated: 47 mL/min — ABNORMAL LOW (ref >60)
Glucose, Bld: 175 mg/dL — ABNORMAL HIGH (ref 70–99)
Potassium: 4 mmol/L (ref 3.5–5.1)
Sodium: 137 mmol/L (ref 135–145)
Total Bilirubin: 1 mg/dL (ref 0.0–1.2)
Total Protein: 7.1 g/dL (ref 6.5–8.1)

## 2023-03-24 LAB — IRON AND TIBC
Iron: 78 ug/dL (ref 45–182)
Saturation Ratios: 22 % (ref 17.9–39.5)
TIBC: 354 ug/dL (ref 250–450)
UIBC: 276 ug/dL

## 2023-03-24 LAB — FERRITIN: Ferritin: 35 ng/mL (ref 24–336)

## 2023-03-25 DIAGNOSIS — I10 Essential (primary) hypertension: Secondary | ICD-10-CM | POA: Diagnosis not present

## 2023-03-25 DIAGNOSIS — E1165 Type 2 diabetes mellitus with hyperglycemia: Secondary | ICD-10-CM | POA: Diagnosis not present

## 2023-03-25 LAB — KAPPA/LAMBDA LIGHT CHAINS
Kappa free light chain: 46.6 mg/L — ABNORMAL HIGH (ref 3.3–19.4)
Kappa, lambda light chain ratio: 2.93 — ABNORMAL HIGH (ref 0.26–1.65)
Lambda free light chains: 15.9 mg/L (ref 5.7–26.3)

## 2023-03-31 ENCOUNTER — Inpatient Hospital Stay (HOSPITAL_BASED_OUTPATIENT_CLINIC_OR_DEPARTMENT_OTHER): Payer: Medicare PPO | Admitting: Hematology

## 2023-03-31 VITALS — BP 141/96 | HR 100 | Temp 97.8°F | Resp 22 | Wt 385.8 lb

## 2023-03-31 DIAGNOSIS — F17290 Nicotine dependence, other tobacco product, uncomplicated: Secondary | ICD-10-CM | POA: Diagnosis not present

## 2023-03-31 DIAGNOSIS — K219 Gastro-esophageal reflux disease without esophagitis: Secondary | ICD-10-CM | POA: Diagnosis not present

## 2023-03-31 DIAGNOSIS — M47814 Spondylosis without myelopathy or radiculopathy, thoracic region: Secondary | ICD-10-CM | POA: Diagnosis not present

## 2023-03-31 DIAGNOSIS — I10 Essential (primary) hypertension: Secondary | ICD-10-CM | POA: Diagnosis not present

## 2023-03-31 DIAGNOSIS — Z7901 Long term (current) use of anticoagulants: Secondary | ICD-10-CM | POA: Diagnosis not present

## 2023-03-31 DIAGNOSIS — R001 Bradycardia, unspecified: Secondary | ICD-10-CM | POA: Diagnosis not present

## 2023-03-31 DIAGNOSIS — M899 Disorder of bone, unspecified: Secondary | ICD-10-CM | POA: Diagnosis not present

## 2023-03-31 DIAGNOSIS — I4891 Unspecified atrial fibrillation: Secondary | ICD-10-CM | POA: Diagnosis not present

## 2023-03-31 DIAGNOSIS — M898X9 Other specified disorders of bone, unspecified site: Secondary | ICD-10-CM

## 2023-03-31 DIAGNOSIS — D472 Monoclonal gammopathy: Secondary | ICD-10-CM

## 2023-03-31 DIAGNOSIS — E119 Type 2 diabetes mellitus without complications: Secondary | ICD-10-CM | POA: Diagnosis not present

## 2023-03-31 NOTE — Progress Notes (Signed)
Kevin Goodman [address], [place] [address]   Clinic Day:  04/06/23   Referring physician: Benetta Spar*  Patient Care Team: Benetta Spar, MD as PCP - General (Internal Medicine) Wyline Mood Dorothe Pea, MD as PCP - Cardiology (Cardiology) West Bali, MD (Inactive) as Consulting Physician (Gastroenterology) Lanelle Bal, DO as Consulting Physician (Internal Medicine) Doreatha Massed, MD as Medical Oncologist (Hematology)   ASSESSMENT & PLAN:   Assessment: 1.  IgG kappa MGUS: - Patient seen at the request of Dr. Wolfgang Phoenix - 04/15/2022: Kappa light chains 50.6, lambda light chains 17.4, ratio 2.91.  SPEP with M spike 0.1 g.  IFE-IgG kappa.  24-hour urine protein 257 mg.  Urine IFE: Faint IgG kappa immunoglobulin detected.  Hb-15.1, creatinine-1.73, calcium-10.0, albumin 4.1.  Ferritin was low at 17. - LDH normal.  Beta-2 microglobulin 2.7.  2.  Social/family history: - He lives at home with his stepson.  Independent of ADLs and IADLs.  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  Used to smoke 2 to 3 cigars/day starting in his 39s.  Now smokes cigars occasionally 2-3 times a year. - Brother and father had prostate cancers.  Plan: 1.  IgG kappa MGUS: - Reviewed myeloma labs which showed M spike 0.7 g of up from 0.5 g.  Kappa light chains are 46.6 with ratio of 2.93.  Creatinine is 1.56 and calcium normal. - Thoracic spine x-ray showed increased conspicuity of possible 1 cm lucent lesion in the T12 vertebral body. - Recommend MRI of the thoracic spine.  He is claustrophobic.  Will give Xanax 1 mg tablet to be taken 1 hour prior to the scan.  Told him to get a driver to bring him back home.  2.  Low iron levels: - He is taking iron tablet 3 times weekly.  Ferritin is 35.  Hemoglobin is normal at 16.1.  Continue the same.   Orders Placed This Encounter  Procedures   MR Thoracic Spine W Wo Contrast    Standing  Status:   Future    Expected Date:   04/07/2023    Expiration Date:   03/30/2024    GRA to provide read?:   Yes    If indicated for the ordered procedure, I authorize the administration of contrast media per Radiology protocol:   Yes    What is the patient's sedation requirement?:   No Sedation    Use SRS Protocol?:   No    Does the patient have a pacemaker or implanted devices?:   No    Preferred imaging location?:   [facility] (table limit - 550lbs)   CBC with Differential    Standing Status:   Future    Expected Date:   09/21/2023    Expiration Date:   03/30/2024   Comprehensive metabolic panel    Standing Status:   Future    Expected Date:   09/21/2023    Expiration Date:   03/30/2024   Beta 2 microglobuline, serum    Standing Status:   Future    Expected Date:   09/21/2023    Expiration Date:   03/30/2024   Immunofixation electrophoresis    Standing Status:   Future    Expected Date:   09/21/2023    Expiration Date:   03/30/2024   Protein electrophoresis, serum    Standing Status:   Future    Expected Date:   09/21/2023  Expiration Date:   03/30/2024   Iron and TIBC ([facility] ONLY)    Standing Status:   Future    Expected Date:   09/21/2023    Expiration Date:   03/30/2024   Ferritin    Standing Status:   Future    Expected Date:   09/21/2023    Expiration Date:   03/30/2024     Mikeal Hawthorne R Teague,acting as a scribe for Doreatha Massed, MD.,have documented all relevant documentation on the behalf of Doreatha Massed, MD,as directed by  Doreatha Massed, MD while in the presence of Doreatha Massed, MD.  I, Doreatha Massed MD, have reviewed the above documentation for accuracy and completeness, and I agree with the above.      Doreatha Massed, MD   1/29/20255:56 PM  CHIEF COMPLAINT/PURPOSE OF CONSULT:   Diagnosis: MGUS  Current Therapy: Observation  HISTORY OF PRESENT ILLNESS:   Kevin Goodman is a 73 y.o. male presenting to  clinic today for evaluation of MGUS at the request of Dr. Wolfgang Phoenix.    Labs on 04/15/2022 showed 0.1 g of IgG kappa monoclonal protein with normal hemoglobin. Labs were done for workup of CKD.   Today, he states that he is doing well overall. His appetite level is at 100%. His energy level is at 50%.   He takes Eliquis 5mg  BID for atrial fibrillation. He denies any B symptoms.   His last colonoscopy was 12/25/20- biopsy showed tubular adenoma without high grade dysplasia.  INTERVAL HISTORY:   Kevin Goodman is a 73 y.o. male presenting to the clinic today for follow-up of MGUS. He was last seen by me on 09/30/22.  Since our last visit, he was admitted to the hospital from 02/10/23 to 02/11/23 for bradycardia due to accidentally taking an extra AV blocking medication.   He underwent a DG thoracic spine with Swimmers on 03/24/23 that found: increased conspicuity of a possible 1 cm lucent lesion in the T12 vertebral body. He also had a bone survey done on 09/30/22 that found: subtle bone lucency described on the prior report at T12 is less apparent, suspected to be the normal posterior central lucency from entering vessels; previously described L1 lucency and proximal left humeral lucency are no longer visualized; the right scaphoid lesion is a bone cyst not suspicious for a the lytic lesion; and no current convincing lytic bone lesion.  Today, he states that he is doing well overall. His appetite level is at 100%. His energy level is at 100%.  He states he had a fall 2 months ago and has residual soreness on the right arm/shoulder. He denies any other new onset pains. He is taking oral iron daily without any BRBPR or melena. He does note occasional hard stools. He has not taken Xanax or Ativan in the past, though he is willing to take it for his MRI as he is claustrophobic.   PAST MEDICAL HISTORY:   Past Medical History: Past Medical History:  Diagnosis Date   Atrial fibrillation (HCC)    a.  initialy occurring in 2013  b. recurrence in 06/2020 in the setting of COVID-19 --> s/p DCCV in 07/2020   Chronic kidney disease, stage 3a (HCC)    Diabetes mellitus, type 2 (HCC)    Dysrhythmia    GERD (gastroesophageal reflux disease)    Gout    Hypertension    Mild pulmonary hypertension (HCC)    Morbid obesity (HCC)    OSA (obstructive sleep apnea)    Paroxysmal atrial  flutter (HCC)    Sleep apnea     Surgical History: Past Surgical History:  Procedure Laterality Date   BALLOON DILATION N/A 12/25/2020   Procedure: BALLOON DILATION;  Surgeon: Lanelle Bal, DO;  [facility];  Service: Endoscopy;  Laterality: N/A;   BIOPSY  12/25/2020   Procedure: BIOPSY;  Surgeon: Lanelle Bal, DO;  [facility];  Service: Endoscopy;;   BIOPSY  08/05/2022   Procedure: BIOPSY;  Surgeon: Lanelle Bal, DO;  [facility];  Service: Endoscopy;;   CARDIOVERSION N/A 07/09/2020   Procedure: CARDIOVERSION;  Surgeon: Antoine Poche, MD;  [facility]: [facility];  Service: Endoscopy;  Laterality: N/A;   COLONOSCOPY N/A 12/08/2016   sigmoid and descending colon diverticula, four semi-pedunculated polyps in descending and ascendign colon, few polyps in descending and ascending colon (tubular adenomas). 3 year surveillance.    COLONOSCOPY WITH PROPOFOL N/A 12/25/2020   Surgeon: Earnest Bailey K, DO; Nonbleeding internal hemorrhoids, pancolonic diverticulosis, 2 mm tubular adenoma.  Recommended repeat in 5 years.   COLONOSCOPY WITH PROPOFOL N/A 08/05/2022   Procedure: COLONOSCOPY WITH PROPOFOL;  Surgeon: Lanelle Bal, DO;  [facility];  Service: Endoscopy;  Laterality: N/A;  7:30 am, ASA 3   ESOPHAGOGASTRODUODENOSCOPY N/A 12/08/2016   normal esophagus s/p empiric dilation, small hiatal hernia.    ESOPHAGOGASTRODUODENOSCOPY (EGD) WITH PROPOFOL N/A 12/25/2020   Surgeon: Lanelle Bal, DO;  Normal esophagus s/p empiric dilation, gastritis biopsied,  normal examined duodenum.  Pathology with slight chronic inflammation, no H. pylori.   ESOPHAGOGASTRODUODENOSCOPY (EGD) WITH PROPOFOL N/A 08/05/2022   Procedure: ESOPHAGOGASTRODUODENOSCOPY (EGD) WITH PROPOFOL;  Surgeon: Lanelle Bal, DO;  [facility];  Service: Endoscopy;  Laterality: N/A;   FINGER SURGERY     left ring   INCISION AND DRAINAGE OF WOUND Left 04/20/2016   Procedure: LEFT THIGH DEBRIDEMENT;  Surgeon: Rodman Pickle, MD;  Location: [facility] OR;  Service: General;  Laterality: Left;   MALONEY DILATION N/A 12/08/2016   Procedure: Alvy Beal;  Surgeon: Corbin Ade, MD;  [facility];  Service: Endoscopy;  Laterality: N/A;   POLYPECTOMY  12/08/2016   Procedure: POLYPECTOMY;  Surgeon: Corbin Ade, MD;  [facility];  Service: Endoscopy;;  colon   POLYPECTOMY  12/25/2020   Procedure: POLYPECTOMY;  Surgeon: Lanelle Bal, DO;  [facility];  Service: Endoscopy;;   POLYPECTOMY  08/05/2022   Procedure: POLYPECTOMY;  Surgeon: Lanelle Bal, DO;  [facility];  Service: Endoscopy;;    Social History: Social History   Socioeconomic History   Marital status: Widowed    Spouse name: Not on file   Number of children: Not on file   Years of education: Not on file   Highest education level: Not on file  Occupational History   Occupation: Security    Employer: [employer]  Tobacco Use   Smoking status: Some Days    Types: Cigars    Last attempt to quit: 07/08/2018    Years since quitting: 4.7   Smokeless tobacco: Never   Tobacco comments:    smoked 2-3 cigars daily  Vaping Use   Vaping status: Never Used  Substance and Sexual Activity   Alcohol use: Not Currently    Comment: occasional shot of liquour every now and then    Drug use: No   Sexual activity: Yes  Other Topics Concern   Not on file  Social  History Narrative   Not on file   Social Drivers of Health   Financial Resource Strain:  Not on file  Food Insecurity: Unknown (02/10/2023)   Hunger Vital Sign    Worried About Running Out of Food in the Last Year: Patient declined    Ran Out of Food in the Last Year: Never true  Transportation Needs: No Transportation Needs (02/10/2023)   PRAPARE - Administrator, Civil Service (Medical): No    Lack of Transportation (Non-Medical): No  Physical Activity: Not on file  Stress: Not on file  Social Connections: Not on file  Intimate Partner Violence: Not At Risk (02/10/2023)   Humiliation, Afraid, Rape, and Kick questionnaire    Fear of Current or Ex-Partner: No    Emotionally Abused: No    Physically Abused: No    Sexually Abused: No    Family History: Family History  Problem Relation Age of Onset   Heart failure Mother    Hypertension Mother    Hypertension Father    Stroke Father    Prostate cancer Father    Stroke Brother    Prostate cancer Brother    Colon cancer Neg Hx    Colon polyps Neg Hx     Current Medications:  Current Outpatient Medications:    ALPRAZolam (XANAX) 1 MG tablet, Take 1 tablet (1 mg total) by mouth once for 1 dose. Take 1 hour prior to MRI., Disp: 1 tablet, Rfl: 0   acetaminophen (TYLENOL) 500 MG tablet, Take 1,000 mg by mouth every 6 (six) hours as needed for moderate pain., Disp: , Rfl:    allopurinol (ZYLOPRIM) 100 MG tablet, Take 100 mg by mouth daily., Disp: , Rfl:    apixaban (ELIQUIS) 5 MG TABS tablet, Take 1 tablet (5 mg total) by mouth 2 (two) times daily., Disp: 60 tablet, Rfl: 1   atorvastatin (LIPITOR) 20 MG tablet, Take 20 mg by mouth daily., Disp: , Rfl:    Colchicine 0.6 MG CAPS, Take 1 capsule by mouth 2 (two) times daily., Disp: , Rfl:    dapagliflozin propanediol (FARXIGA) 5 MG TABS tablet, Take 5 mg by mouth daily., Disp: , Rfl:    diltiazem (TIAZAC) 360 MG 24 hr capsule, Take 1 capsule (360 mg total) by mouth daily., Disp: , Rfl:    ferrous sulfate 325 (65 FE) MG EC tablet, Take 325 mg by mouth daily., Disp:  , Rfl:    furosemide (LASIX) 40 MG tablet, Take 40 mg by mouth 2 (two) times daily., Disp: , Rfl:    hydrALAZINE (APRESOLINE) 100 MG tablet, Take 100 mg by mouth 2 (two) times daily., Disp: , Rfl:    Insulin Degludec FlexTouch 100 UNIT/ML SOPN, SMARTSIG:40 Unit(s) SUB-Q Every Night, Disp: , Rfl:    LANTUS SOLOSTAR 100 UNIT/ML Solostar Pen, Inject 40 Units into the skin at bedtime., Disp: , Rfl:    losartan (COZAAR) 100 MG tablet, Take 1 tablet (100 mg total) by mouth daily., Disp: 90 tablet, Rfl: 3   metoprolol tartrate (LOPRESSOR) 50 MG tablet, Take 1 tablet (50 mg total) by mouth 2 (two) times daily., Disp: , Rfl:    oxymetazoline (AFRIN) 0.05 % nasal spray, Place 1 spray into both nostrils 2 (two) times daily as needed for congestion., Disp: , Rfl:    pantoprazole (PROTONIX) 40 MG tablet, Take 1 tablet (40 mg total) by mouth 2 (two) times daily before a meal. 30 minutes before breakfast, Disp: 180 tablet, Rfl: 1   TRADJENTA  5 MG TABS tablet, Take 5 mg by mouth daily., Disp: , Rfl:    Allergies: No Known Allergies  REVIEW OF SYSTEMS:   Review of Systems  Constitutional:  Negative for chills, fatigue and fever.  HENT:   Negative for lump/mass, mouth sores, nosebleeds, sore throat and trouble swallowing.   Eyes:  Negative for eye problems.  Respiratory:  Positive for shortness of breath. Negative for cough.   Cardiovascular:  Negative for chest pain, leg swelling and palpitations.  Gastrointestinal:  Positive for constipation. Negative for abdominal pain, diarrhea, nausea and vomiting.  Genitourinary:  Negative for bladder incontinence, difficulty urinating, dysuria, frequency, hematuria and nocturia.   Musculoskeletal:  Negative for arthralgias, back pain, flank pain, myalgias and neck pain.  Skin:  Negative for itching and rash.  Neurological:  Negative for dizziness, headaches and numbness.  Hematological:  Does not bruise/bleed easily.  Psychiatric/Behavioral:  Negative for  depression, sleep disturbance and suicidal ideas. The patient is not nervous/anxious.   All other systems reviewed and are negative.    VITALS:   Blood pressure (!) 141/96, pulse 100, temperature 97.8 F (36.6 C), temperature source Tympanic, resp. rate (!) 22, weight (!) 385 lb 12.9 oz (175 kg), SpO2 100%.  Wt Readings from Last 3 Encounters:  03/31/23 (!) 385 lb 12.9 oz (175 kg)  02/11/23 (!) 394 lb 13.5 oz (179.1 kg)  01/05/23 (!) 397 lb 12.8 oz (180.4 kg)    Body mass index is 50.9 kg/m.   PHYSICAL EXAM:   Physical Exam Vitals and nursing note reviewed. Exam conducted with a chaperone present.  Constitutional:      Appearance: Normal appearance.  Cardiovascular:     Rate and Rhythm: Normal rate and regular rhythm.     Pulses: Normal pulses.     Heart sounds: Normal heart sounds.  Pulmonary:     Effort: Pulmonary effort is normal.     Breath sounds: Normal breath sounds.  Abdominal:     Palpations: Abdomen is soft. There is no hepatomegaly, splenomegaly or mass.     Tenderness: There is no abdominal tenderness.  Musculoskeletal:     Right lower leg: No edema.     Left lower leg: No edema.  Lymphadenopathy:     Cervical: No cervical adenopathy.     Right cervical: No superficial, deep or posterior cervical adenopathy.    Left cervical: No superficial, deep or posterior cervical adenopathy.     Upper Body:     Right upper body: No supraclavicular or axillary adenopathy.     Left upper body: No supraclavicular or axillary adenopathy.  Neurological:     General: No focal deficit present.     Mental Status: He is alert and oriented to person, place, and time.  Psychiatric:        Mood and Affect: Mood normal.        Behavior: Behavior normal.    LABS:      Latest Ref Rng & Units 03/24/2023   10:54 AM 02/10/2023    8:45 AM 09/24/2022   12:59 PM  CBC  WBC 4.0 - 10.5 K/uL 11.3  14.2  13.6   Hemoglobin 13.0 - 17.0 g/dL 60.4  54.0  98.1   Hematocrit 39.0 - 52.0 %  48.8  43.7  48.4   Platelets 150 - 400 K/uL 307  248  244       Latest Ref Rng & Units 03/24/2023   10:54 AM 02/11/2023    4:12 AM 02/10/2023  8:45 AM  CMP  Glucose 70 - 99 mg/dL 213  086  578   BUN 8 - 23 mg/dL 25  29  36   Creatinine 0.61 - 1.24 mg/dL 4.69  6.29  5.28   Sodium 135 - 145 mmol/L 137  138  136   Potassium 3.5 - 5.1 mmol/L 4.0  3.7  4.3   Chloride 98 - 111 mmol/L 105  106  104   CO2 22 - 32 mmol/L 21  23  19    Calcium 8.9 - 10.3 mg/dL 9.6  9.0  8.9   Total Protein 6.5 - 8.1 g/dL 7.1   6.6   Total Bilirubin 0.0 - 1.2 mg/dL 1.0   0.6   Alkaline Phos 38 - 126 U/L 66   102   AST 15 - 41 U/L 15   108   ALT 0 - 44 U/L 16   106    No results found for: "CEA1", "CEA" / No results found for: "CEA1", "CEA" No results found for: "PSA1" No results found for: "CAN199" No results found for: "CAN125"  Lab Results  Component Value Date   TOTALPROTELP 7.0 03/24/2023   ALBUMINELP 3.6 03/24/2023   A1GS 0.3 03/24/2023   A2GS 0.7 03/24/2023   BETS 1.2 03/24/2023   GAMS 1.2 03/24/2023   MSPIKE 0.7 (H) 03/24/2023   SPEI Comment 03/24/2023   Lab Results  Component Value Date   TIBC 354 03/24/2023   TIBC 359 09/24/2022   TIBC 410 05/21/2022   FERRITIN 35 03/24/2023   FERRITIN 58 09/24/2022   FERRITIN 25 05/21/2022   IRONPCTSAT 22 03/24/2023   IRONPCTSAT 13 (L) 09/24/2022   IRONPCTSAT 15 (L) 05/21/2022   Lab Results  Component Value Date   LDH 126 05/21/2022   STUDIES:   DG Thoracic Spine W/Swimmers Result Date: 03/30/2023 CLINICAL DATA:  MGUS. EXAM: THORACIC SPINE - 3 VIEWS COMPARISON:  Metastatic bone survey 09/30/2022 FINDINGS: There is minimal thoracolumbar scoliosis. There is no significant listhesis. No acute fracture is identified. There is mild thoracic spondylosis. A 1 cm rounded lucent focus projecting over the posterior aspect of the T12 vertebral body has a more discrete and conspicuous appearance than on the prior study raising the possibility of a true bone  lesion, although differences in patient positioning and centering of the detector on today's study creates the possibility that the current appearance is related to superimposed basilar lung markings. No other thoracic spine lesions are identified. IMPRESSION: Increased conspicuity of a possible 1 cm lucent lesion in the T12 vertebral body. This could be further assessed with MRI if clinically warranted. Electronically Signed   By: [name] Ache M.D.   On: 03/30/2023 11:55

## 2023-03-31 NOTE — Patient Instructions (Signed)
[facility] at [facility] Discharge Instructions   You were seen and examined today by Dr. Ellin Saba.  He reviewed the results of your lab work which are normal/stable.   He reviewed the results of your back x-ray. It is showing a spot on your thoracic spine (T 12). We will obtain an MRI to investigate this further.    Return as scheduled.    Thank you for choosing [facility] at [facility] to provide your oncology and hematology care.  To afford each patient quality time with our provider, please arrive at least 15 minutes before your scheduled appointment time.   If you have a lab appointment with the Cancer Center please come in thru the Main Entrance and check in at the main information desk.  You need to re-schedule your appointment should you arrive 10 or more minutes late.  We strive to give you quality time with our providers, and arriving late affects you and other patients whose appointments are after yours.  Also, if you no show three or more times for appointments you may be dismissed from the clinic at the providers discretion.     Again, thank you for choosing [facility].  Our hope is that these requests will decrease the amount of time that you wait before being seen by our physicians.       _____________________________________________________________  Should you have questions after your visit to [facility], please contact our office at [phone] and follow the prompts.  Our office hours are 8:00 a.m. and 4:30 p.m. Monday - Friday.  Please note that voicemails left after 4:00 p.m. may not be returned until the following business day.  We are closed weekends and major holidays.  You do have access to a nurse 24-7, just call the main number to the clinic [phone] and do not press any options, hold on the line and a nurse will answer the phone.    For prescription refill requests, have your  pharmacy contact our office and allow 72 hours.    Due to Covid, you will need to wear a mask upon entering the hospital. If you do not have a mask, a mask will be given to you at the Main Entrance upon arrival. For doctor visits, patients may have 1 support person age 49 or older with them. For treatment visits, patients can not have anyone with them due to social distancing guidelines and our immunocompromised population.

## 2023-04-03 LAB — PROTEIN ELECTROPHORESIS, SERUM
A/G Ratio: 1.1 (ref 0.7–1.7)
Albumin ELP: 3.6 g/dL (ref 2.9–4.4)
Alpha-1-Globulin: 0.3 g/dL (ref 0.0–0.4)
Alpha-2-Globulin: 0.7 g/dL (ref 0.4–1.0)
Beta Globulin: 1.2 g/dL (ref 0.7–1.3)
Gamma Globulin: 1.2 g/dL (ref 0.4–1.8)
Globulin, Total: 3.4 g/dL (ref 2.2–3.9)
M-Spike, %: 0.7 g/dL — ABNORMAL HIGH
Total Protein ELP: 7 g/dL (ref 6.0–8.5)

## 2023-04-06 MED ORDER — ALPRAZOLAM 1 MG PO TABS
1.0000 mg | ORAL_TABLET | Freq: Once | ORAL | 0 refills | Status: AC
Start: 2023-04-06 — End: 2023-04-06

## 2023-04-08 ENCOUNTER — Ambulatory Visit (HOSPITAL_COMMUNITY)
Admission: RE | Admit: 2023-04-08 | Discharge: 2023-04-08 | Disposition: A | Discharge status: Home / Self Care | Payer: Medicare PPO | Source: Ambulatory Visit | Attending: Hematology | Admitting: Hematology

## 2023-04-08 ENCOUNTER — Encounter (HOSPITAL_COMMUNITY): Payer: Self-pay

## 2023-04-08 DIAGNOSIS — I129 Hypertensive chronic kidney disease with stage 1 through stage 4 chronic kidney disease, or unspecified chronic kidney disease: Secondary | ICD-10-CM | POA: Diagnosis not present

## 2023-04-08 DIAGNOSIS — M899 Disorder of bone, unspecified: Secondary | ICD-10-CM

## 2023-04-08 DIAGNOSIS — E1129 Type 2 diabetes mellitus with other diabetic kidney complication: Secondary | ICD-10-CM | POA: Diagnosis not present

## 2023-04-08 DIAGNOSIS — N1832 Chronic kidney disease, stage 3b: Secondary | ICD-10-CM | POA: Diagnosis not present

## 2023-04-08 DIAGNOSIS — D472 Monoclonal gammopathy: Secondary | ICD-10-CM

## 2023-04-08 DIAGNOSIS — E1122 Type 2 diabetes mellitus with diabetic chronic kidney disease: Secondary | ICD-10-CM | POA: Diagnosis not present

## 2023-04-08 DIAGNOSIS — I5032 Chronic diastolic (congestive) heart failure: Secondary | ICD-10-CM | POA: Diagnosis not present

## 2023-04-08 DIAGNOSIS — N2581 Secondary hyperparathyroidism of renal origin: Secondary | ICD-10-CM | POA: Diagnosis not present

## 2023-04-08 DIAGNOSIS — R809 Proteinuria, unspecified: Secondary | ICD-10-CM | POA: Diagnosis not present

## 2023-04-13 ENCOUNTER — Other Ambulatory Visit: Payer: Self-pay | Admitting: *Deleted

## 2023-04-13 MED ORDER — ALPRAZOLAM 1 MG PO TABS: ORAL_TABLET | ORAL | 0 refills | Status: DC

## 2023-04-14 ENCOUNTER — Inpatient Hospital Stay: Payer: Medicare PPO | Admitting: Hematology

## 2023-04-15 DIAGNOSIS — I129 Hypertensive chronic kidney disease with stage 1 through stage 4 chronic kidney disease, or unspecified chronic kidney disease: Secondary | ICD-10-CM | POA: Diagnosis not present

## 2023-04-15 DIAGNOSIS — I5032 Chronic diastolic (congestive) heart failure: Secondary | ICD-10-CM | POA: Diagnosis not present

## 2023-04-15 DIAGNOSIS — D472 Monoclonal gammopathy: Secondary | ICD-10-CM | POA: Diagnosis not present

## 2023-04-15 DIAGNOSIS — N1832 Chronic kidney disease, stage 3b: Secondary | ICD-10-CM | POA: Diagnosis not present

## 2023-04-20 ENCOUNTER — Other Ambulatory Visit (HOSPITAL_COMMUNITY): Payer: Medicare PPO

## 2023-04-22 ENCOUNTER — Ambulatory Visit (HOSPITAL_COMMUNITY)
Admission: RE | Admit: 2023-04-22 | Discharge: 2023-04-22 | Disposition: A | Discharge status: Home / Self Care | Payer: Medicare PPO | Source: Ambulatory Visit | Attending: Hematology | Admitting: Hematology

## 2023-04-22 DIAGNOSIS — D472 Monoclonal gammopathy: Secondary | ICD-10-CM | POA: Insufficient documentation

## 2023-04-22 DIAGNOSIS — M4804 Spinal stenosis, thoracic region: Secondary | ICD-10-CM | POA: Diagnosis not present

## 2023-04-22 DIAGNOSIS — M899 Disorder of bone, unspecified: Secondary | ICD-10-CM | POA: Insufficient documentation

## 2023-04-22 MED ORDER — GADOBUTROL 1 MMOL/ML IV SOLN
10.0000 mL | Freq: Once | INTRAVENOUS | Status: AC | PRN
  Administered 2023-04-22: 10 mL via INTRAVENOUS

## 2023-04-25 DIAGNOSIS — E1165 Type 2 diabetes mellitus with hyperglycemia: Secondary | ICD-10-CM | POA: Diagnosis not present

## 2023-04-25 DIAGNOSIS — I10 Essential (primary) hypertension: Secondary | ICD-10-CM | POA: Diagnosis not present

## 2023-04-28 ENCOUNTER — Inpatient Hospital Stay: Payer: Medicare PPO | Admitting: Hematology

## 2023-05-04 ENCOUNTER — Inpatient Hospital Stay: Payer: Medicare PPO | Attending: Hematology | Admitting: Hematology

## 2023-05-04 VITALS — BP 164/102 | HR 63 | Temp 96.7°F | Resp 20 | Wt 396.4 lb

## 2023-05-04 DIAGNOSIS — Z823 Family history of stroke: Secondary | ICD-10-CM | POA: Diagnosis not present

## 2023-05-04 DIAGNOSIS — I10 Essential (primary) hypertension: Secondary | ICD-10-CM | POA: Diagnosis not present

## 2023-05-04 DIAGNOSIS — K219 Gastro-esophageal reflux disease without esophagitis: Secondary | ICD-10-CM | POA: Insufficient documentation

## 2023-05-04 DIAGNOSIS — Z8616 Personal history of COVID-19: Secondary | ICD-10-CM | POA: Diagnosis not present

## 2023-05-04 DIAGNOSIS — F17290 Nicotine dependence, other tobacco product, uncomplicated: Secondary | ICD-10-CM | POA: Insufficient documentation

## 2023-05-04 DIAGNOSIS — I4891 Unspecified atrial fibrillation: Secondary | ICD-10-CM | POA: Insufficient documentation

## 2023-05-04 DIAGNOSIS — Z79899 Other long term (current) drug therapy: Secondary | ICD-10-CM | POA: Insufficient documentation

## 2023-05-04 DIAGNOSIS — D472 Monoclonal gammopathy: Secondary | ICD-10-CM | POA: Insufficient documentation

## 2023-05-04 DIAGNOSIS — Z860101 Personal history of adenomatous and serrated colon polyps: Secondary | ICD-10-CM | POA: Diagnosis not present

## 2023-05-04 DIAGNOSIS — Z8042 Family history of malignant neoplasm of prostate: Secondary | ICD-10-CM | POA: Insufficient documentation

## 2023-05-04 DIAGNOSIS — Z7901 Long term (current) use of anticoagulants: Secondary | ICD-10-CM | POA: Diagnosis not present

## 2023-05-04 DIAGNOSIS — Z8249 Family history of ischemic heart disease and other diseases of the circulatory system: Secondary | ICD-10-CM | POA: Insufficient documentation

## 2023-05-04 DIAGNOSIS — Z8719 Personal history of other diseases of the digestive system: Secondary | ICD-10-CM | POA: Diagnosis not present

## 2023-05-04 NOTE — Patient Instructions (Addendum)
 [facility] at [facility] Discharge Instructions   You were seen and examined today by Dr. Ellin Saba.  He reviewed the results of your MRI. It was a normal exam.   We will see you back in 6 months. We will repeat lab work prior to this visit.   Return as scheduled.    Thank you for choosing [facility] at [facility] to provide your oncology and hematology care.  To afford each patient quality time with our provider, please arrive at least 15 minutes before your scheduled appointment time.   If you have a lab appointment with the Cancer Center please come in thru the Main Entrance and check in at the main information desk.  You need to re-schedule your appointment should you arrive 10 or more minutes late.  We strive to give you quality time with our providers, and arriving late affects you and other patients whose appointments are after yours.  Also, if you no show three or more times for appointments you may be dismissed from the clinic at the providers discretion.     Again, thank you for choosing [facility].  Our hope is that these requests will decrease the amount of time that you wait before being seen by our physicians.       _____________________________________________________________  Should you have questions after your visit to [facility], please contact our office at [phone] and follow the prompts.  Our office hours are 8:00 a.m. and 4:30 p.m. Monday - Friday.  Please note that voicemails left after 4:00 p.m. may not be returned until the following business day.  We are closed weekends and major holidays.  You do have access to a nurse 24-7, just call the main number to the clinic [phone] and do not press any options, hold on the line and a nurse will answer the phone.    For prescription refill requests, have your pharmacy contact our office and allow 72 hours.    Due to Covid, you will need  to wear a mask upon entering the hospital. If you do not have a mask, a mask will be given to you at the Main Entrance upon arrival. For doctor visits, patients may have 1 support person age 52 or older with them. For treatment visits, patients can not have anyone with them due to social distancing guidelines and our immunocompromised population.

## 2023-05-04 NOTE — Progress Notes (Signed)
 [facility] [address] [place] [address]   Clinic Day:  05/04/23   Referring physician: Benetta Spar*  Patient Care Team: Benetta Spar, MD as PCP - General (Internal Medicine) Wyline Mood Dorothe Pea, MD as PCP - Cardiology (Cardiology) West Bali, MD (Inactive) as Consulting Physician (Gastroenterology) Lanelle Bal, DO as Consulting Physician (Internal Medicine) Doreatha Massed, MD as Medical Oncologist (Hematology)   ASSESSMENT & PLAN:   Assessment: 1.  IgG kappa MGUS: - Patient seen at the request of Dr. Wolfgang Phoenix - 04/15/2022: Kappa light chains 50.6, lambda light chains 17.4, ratio 2.91.  SPEP with M spike 0.1 g.  IFE-IgG kappa.  24-hour urine protein 257 mg.  Urine IFE: Faint IgG kappa immunoglobulin detected.  Hb-15.1, creatinine-1.73, calcium-10.0, albumin 4.1.  Ferritin was low at 17. - LDH normal.  Beta-2 microglobulin 2.7.  2.  Social/family history: - He lives at home with his stepson.  Independent of ADLs and IADLs.  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  Used to smoke 2 to 3 cigars/day starting in his 10s.  Now smokes cigars occasionally 2-3 times a year. - Brother and father had prostate cancers.  Plan: 1.  IgG kappa MGUS: - Myeloma labs on 1/1 4 08/2023 showed M spike 0.7 g, up from 0.5 g.  FLC ratio is 2.93 with kappa light chains 46.  Creatinine is 1.56 and calcium normal. - Thoracic x-ray showed increased conspicuity of possible 1 cm lucent lesion in the T12 body. - We did MRI of the thoracic spine on 04/22/2023: Negative for bone lesion. - Recommend follow-up in 6 months with repeat myeloma labs.  Will also repeat skeletal survey at that time.  2.  Low iron levels: - Continue iron tablet 3 times weekly.  Ferritin is 35 and hemoglobin 16.1.   No orders of the defined types were placed in this encounter.     Doreatha Massed, MD   2/26/202511:46 AM  CHIEF  COMPLAINT/PURPOSE OF CONSULT:   Diagnosis: MGUS  Current Therapy: Observation  HISTORY OF PRESENT ILLNESS:   Catherine is a 73 y.o. male presenting to clinic today for evaluation of MGUS at the request of Dr. Wolfgang Phoenix.    Labs on 04/15/2022 showed 0.1 g of IgG kappa monoclonal protein with normal hemoglobin. Labs were done for workup of CKD.   Today, he states that he is doing well overall. His appetite level is at 100%. His energy level is at 50%.   He takes Eliquis 5mg  BID for atrial fibrillation. He denies any B symptoms.   His last colonoscopy was 12/25/20- biopsy showed tubular adenoma without high grade dysplasia.  INTERVAL HISTORY:   HANIEL FIX is a 73 y.o. male is seen for follow-up of MGUS.  He had MRI of the thoracic spine done on 04/22/2023 for an abnormal thoracic spine x-ray on 03/24/2023.  PAST MEDICAL HISTORY:   Past Medical History: Past Medical History:  Diagnosis Date   Atrial fibrillation (HCC)    a. initialy occurring in 2013  b. recurrence in 06/2020 in the setting of COVID-19 --> s/p DCCV in 07/2020   Chronic kidney disease, stage 3a (HCC)    Diabetes mellitus, type 2 (HCC)    Dysrhythmia    GERD (gastroesophageal reflux disease)    Gout    Hypertension    Mild pulmonary hypertension (HCC)    Morbid obesity (HCC)    OSA (obstructive sleep apnea)  Paroxysmal atrial flutter (HCC)    Sleep apnea     Surgical History: Past Surgical History:  Procedure Laterality Date   BALLOON DILATION N/A 12/25/2020   Procedure: BALLOON DILATION;  Surgeon: Lanelle Bal, DO;  [facility];  Service: Endoscopy;  Laterality: N/A;   BIOPSY  12/25/2020   Procedure: BIOPSY;  Surgeon: Lanelle Bal, DO;  [facility];  Service: Endoscopy;;   BIOPSY  08/05/2022   Procedure: BIOPSY;  Surgeon: Lanelle Bal, DO;  [facility];  Service: Endoscopy;;   CARDIOVERSION N/A 07/09/2020   Procedure: CARDIOVERSION;  Surgeon: Antoine Poche, MD;  Location: [facility];  Service: Endoscopy;  Laterality: N/A;   COLONOSCOPY N/A 12/08/2016   sigmoid and descending colon diverticula, four semi-pedunculated polyps in descending and ascendign colon, few polyps in descending and ascending colon (tubular adenomas). 3 year surveillance.    COLONOSCOPY WITH PROPOFOL N/A 12/25/2020   Surgeon: Earnest Bailey K, DO; Nonbleeding internal hemorrhoids, pancolonic diverticulosis, 2 mm tubular adenoma.  Recommended repeat in 5 years.   COLONOSCOPY WITH PROPOFOL N/A 08/05/2022   Procedure: COLONOSCOPY WITH PROPOFOL;  Surgeon: Lanelle Bal, DO;  [facility];  Service: Endoscopy;  Laterality: N/A;  7:30 am, ASA 3   ESOPHAGOGASTRODUODENOSCOPY N/A 12/08/2016   normal esophagus s/p empiric dilation, small hiatal hernia.    ESOPHAGOGASTRODUODENOSCOPY (EGD) WITH PROPOFOL N/A 12/25/2020   Surgeon: Lanelle Bal, DO;  Normal esophagus s/p empiric dilation, gastritis biopsied, normal examined duodenum.  Pathology with slight chronic inflammation, no H. pylori.   ESOPHAGOGASTRODUODENOSCOPY (EGD) WITH PROPOFOL N/A 08/05/2022   Procedure: ESOPHAGOGASTRODUODENOSCOPY (EGD) WITH PROPOFOL;  Surgeon: Lanelle Bal, DO;  [facility];  Service: Endoscopy;  Laterality: N/A;   FINGER SURGERY     left ring   INCISION AND DRAINAGE OF WOUND Left 04/20/2016   Procedure: LEFT THIGH DEBRIDEMENT;  Surgeon: Rodman Pickle, MD;  Location[facility];  Service: General;  Laterality: Left;   MALONEY DILATION N/A 12/08/2016   Procedure: Alvy Beal;  Surgeon: Corbin Ade, MD;  [facility];  Service: Endoscopy;  Laterality: N/A;   POLYPECTOMY  12/08/2016   Procedure: POLYPECTOMY;  Surgeon: Corbin Ade, MD;  [facility];  Service: Endoscopy;;  colon   POLYPECTOMY  12/25/2020   Procedure: POLYPECTOMY;  Surgeon: Lanelle Bal, DO;  [facility];  Service: Endoscopy;;   POLYPECTOMY  08/05/2022    Procedure: POLYPECTOMY;  Surgeon: Lanelle Bal, DO;  [facility];  Service: Endoscopy;;    Social History: Social History   Socioeconomic History   Marital status: Widowed    Spouse name: Not on file   Number of children: Not on file   Years of education: Not on file   Highest education level: Not on file  Occupational History   Occupation: Security    Employer: [employer]  Tobacco Use   Smoking status: Some Days    Types: Cigars    Last attempt to quit: 07/08/2018    Years since quitting: 4.8   Smokeless tobacco: Never   Tobacco comments:    smoked 2-3 cigars daily  Vaping Use   Vaping status: Never Used  Substance and Sexual Activity   Alcohol use: Not Currently    Comment: occasional shot of liquour every now and then    Drug use: No   Sexual activity: Yes  Other Topics Concern   Not on  file  Social History Narrative   Not on file   Social Drivers of Health   Financial Resource Strain: Not on file  Food Insecurity: Unknown (02/10/2023)   Hunger Vital Sign    Worried About Running Out of Food in the Last Year: Patient declined    Ran Out of Food in the Last Year: Never true  Transportation Needs: No Transportation Needs (02/10/2023)   PRAPARE - Administrator, Civil Service (Medical): No    Lack of Transportation (Non-Medical): No  Physical Activity: Not on file  Stress: Not on file  Social Connections: Not on file  Intimate Partner Violence: Not At Risk (02/10/2023)   Humiliation, Afraid, Rape, and Kick questionnaire    Fear of Current or Ex-Partner: No    Emotionally Abused: No    Physically Abused: No    Sexually Abused: No    Family History: Family History  Problem Relation Age of Onset   Heart failure Mother    Hypertension Mother    Hypertension Father    Stroke Father    Prostate cancer Father    Stroke Brother    Prostate cancer Brother    Colon cancer Neg Hx    Colon polyps Neg Hx     Current  Medications:  Current Outpatient Medications:    acetaminophen (TYLENOL) 500 MG tablet, Take 1,000 mg by mouth every 6 (six) hours as needed for moderate pain., Disp: , Rfl:    allopurinol (ZYLOPRIM) 100 MG tablet, Take 100 mg by mouth daily., Disp: , Rfl:    ALPRAZolam (XANAX) 1 MG tablet, Take one tablet by mouth 1 hour prior to MRI, Disp: 1 tablet, Rfl: 0   apixaban (ELIQUIS) 5 MG TABS tablet, Take 1 tablet (5 mg total) by mouth 2 (two) times daily., Disp: 60 tablet, Rfl: 1   atorvastatin (LIPITOR) 20 MG tablet, Take 20 mg by mouth daily., Disp: , Rfl:    B-D UF III MINI PEN NEEDLES 31G X 5 MM MISC, , Disp: , Rfl:    calcitRIOL (ROCALTROL) 0.25 MCG capsule, Take 0.25 mcg by mouth 2 (two) times a week., Disp: , Rfl:    Colchicine 0.6 MG CAPS, Take 1 capsule by mouth 2 (two) times daily., Disp: , Rfl:    diltiazem (TIAZAC) 360 MG 24 hr capsule, Take 1 capsule (360 mg total) by mouth daily., Disp: , Rfl:    FARXIGA 5 MG TABS tablet, , Disp: , Rfl:    finasteride (PROSCAR) 5 MG tablet, Take 5 mg by mouth daily., Disp: , Rfl:    furosemide (LASIX) 40 MG tablet, Take 40 mg by mouth 2 (two) times daily., Disp: , Rfl:    hydrALAZINE (APRESOLINE) 100 MG tablet, Take 100 mg by mouth 2 (two) times daily., Disp: , Rfl:    Insulin Degludec FlexTouch 100 UNIT/ML SOPN, SMARTSIG:40 Unit(s) SUB-Q Every Night, Disp: , Rfl:    LANTUS SOLOSTAR 100 UNIT/ML Solostar Pen, Inject 40 Units into the skin at bedtime., Disp: , Rfl:    losartan (COZAAR) 100 MG tablet, Take 1 tablet (100 mg total) by mouth daily., Disp: 90 tablet, Rfl: 3   metoprolol tartrate (LOPRESSOR) 50 MG tablet, Take 1 tablet (50 mg total) by mouth 2 (two) times daily., Disp: , Rfl:    oxymetazoline (AFRIN) 0.05 % nasal spray, Place 1 spray into both nostrils 2 (two) times daily as needed for congestion., Disp: , Rfl:    TRADJENTA 5 MG TABS tablet, Take 5 mg by  mouth daily., Disp: , Rfl:    ferrous sulfate 325 (65 FE) MG EC tablet, Take 325 mg by  mouth daily., Disp: , Rfl:    pantoprazole (PROTONIX) 40 MG tablet, Take 1 tablet (40 mg total) by mouth 2 (two) times daily before a meal. 30 minutes before breakfast, Disp: 180 tablet, Rfl: 1   Allergies: No Known Allergies  REVIEW OF SYSTEMS:   Review of Systems  Constitutional:  Negative for chills, fatigue and fever.  HENT:   Negative for lump/mass, mouth sores, nosebleeds, sore throat and trouble swallowing.   Eyes:  Negative for eye problems.  Respiratory:  Positive for shortness of breath. Negative for cough.   Cardiovascular:  Negative for chest pain, leg swelling and palpitations.  Gastrointestinal:  Positive for diarrhea. Negative for abdominal pain, nausea and vomiting.  Genitourinary:  Negative for bladder incontinence, difficulty urinating, dysuria, frequency, hematuria and nocturia.   Musculoskeletal:  Negative for arthralgias, back pain, flank pain, myalgias and neck pain.  Skin:  Negative for itching and rash.  Neurological:  Negative for dizziness, headaches and numbness.  Hematological:  Does not bruise/bleed easily.  Psychiatric/Behavioral:  Positive for sleep disturbance. Negative for depression and suicidal ideas. The patient is not nervous/anxious.   All other systems reviewed and are negative.    VITALS:   Blood pressure (!) 164/102, pulse 63, temperature (!) 96.7 F (35.9 C), temperature source Tympanic, resp. rate 20, weight (!) 396 lb 6.2 oz (179.8 kg), SpO2 100%.  Wt Readings from Last 3 Encounters:  05/04/23 (!) 396 lb 6.2 oz (179.8 kg)  03/31/23 (!) 385 lb 12.9 oz (175 kg)  02/11/23 (!) 394 lb 13.5 oz (179.1 kg)    Body mass index is 52.3 kg/m.   PHYSICAL EXAM:   Physical Exam Vitals and nursing note reviewed. Exam conducted with a chaperone present.  Constitutional:      Appearance: Normal appearance.  Cardiovascular:     Rate and Rhythm: Normal rate and regular rhythm.     Pulses: Normal pulses.     Heart sounds: Normal heart sounds.   Pulmonary:     Effort: Pulmonary effort is normal.     Breath sounds: Normal breath sounds.  Abdominal:     Palpations: Abdomen is soft. There is no hepatomegaly, splenomegaly or mass.     Tenderness: There is no abdominal tenderness.  Musculoskeletal:     Right lower leg: No edema.     Left lower leg: No edema.  Lymphadenopathy:     Cervical: No cervical adenopathy.     Right cervical: No superficial, deep or posterior cervical adenopathy.    Left cervical: No superficial, deep or posterior cervical adenopathy.     Upper Body:     Right upper body: No supraclavicular or axillary adenopathy.     Left upper body: No supraclavicular or axillary adenopathy.  Neurological:     General: No focal deficit present.     Mental Status: He is alert and oriented to person, place, and time.  Psychiatric:        Mood and Affect: Mood normal.        Behavior: Behavior normal.    LABS:      Latest Ref Rng & Units 03/24/2023   10:54 AM 02/10/2023    8:45 AM 09/24/2022   12:59 PM  CBC  WBC 4.0 - 10.5 K/uL 11.3  14.2  13.6   Hemoglobin 13.0 - 17.0 g/dL 16.1  09.6  04.5   Hematocrit 39.0 -  52.0 % 48.8  43.7  48.4   Platelets 150 - 400 K/uL 307  248  244       Latest Ref Rng & Units 03/24/2023   10:54 AM 02/11/2023    4:12 AM 02/10/2023    8:45 AM  CMP  Glucose 70 - 99 mg/dL 604  540  981   BUN 8 - 23 mg/dL 25  29  36   Creatinine 0.61 - 1.24 mg/dL 1.91  4.78  2.95   Sodium 135 - 145 mmol/L 137  138  136   Potassium 3.5 - 5.1 mmol/L 4.0  3.7  4.3   Chloride 98 - 111 mmol/L 105  106  104   CO2 22 - 32 mmol/L 21  23  19    Calcium 8.9 - 10.3 mg/dL 9.6  9.0  8.9   Total Protein 6.5 - 8.1 g/dL 7.1   6.6   Total Bilirubin 0.0 - 1.2 mg/dL 1.0   0.6   Alkaline Phos 38 - 126 U/L 66   102   AST 15 - 41 U/L 15   108   ALT 0 - 44 U/L 16   106    No results found for: "CEA1", "CEA" / No results found for: "CEA1", "CEA" No results found for: "PSA1" No results found for: "CAN199" No results found  for: "CAN125"  Lab Results  Component Value Date   TOTALPROTELP 7.0 03/24/2023   ALBUMINELP 3.6 03/24/2023   A1GS 0.3 03/24/2023   A2GS 0.7 03/24/2023   BETS 1.2 03/24/2023   GAMS 1.2 03/24/2023   MSPIKE 0.7 (H) 03/24/2023   SPEI Comment 03/24/2023   Lab Results  Component Value Date   TIBC 354 03/24/2023   TIBC 359 09/24/2022   TIBC 410 05/21/2022   FERRITIN 35 03/24/2023   FERRITIN 58 09/24/2022   FERRITIN 25 05/21/2022   IRONPCTSAT 22 03/24/2023   IRONPCTSAT 13 (L) 09/24/2022   IRONPCTSAT 15 (L) 05/21/2022   Lab Results  Component Value Date   LDH 126 05/21/2022   STUDIES:   MR Thoracic Spine W Wo Contrast Result Date: 04/27/2023 CLINICAL DATA:  T2 bone lesion. History of monoclonal gammopathy of undetermined significance. EXAM: MRI THORACIC WITHOUT AND WITH CONTRAST TECHNIQUE: Multiplanar and multiecho pulse sequences of the thoracic spine were obtained without and with intravenous contrast. CONTRAST:  10mL GADAVIST GADOBUTROL 1 MMOL/ML IV SOLN COMPARISON:  03/24/2023 thoracic radiography. FINDINGS: Alignment:  Physiologic. Vertebrae: No fracture, evidence of discitis, or bone lesion. Mild, benign heterogeneity of marrow not typical of generalized myeloma. Cord: Normal signal and morphology, there is generalized limitation from motion artifact. Paraspinal and other soft tissues: No evidence of mass or adenopathy. Disc levels: Disc space narrowing and bulging with endplate spurring especially at T5-6 to T10-11. There is facet spurring on both sides at T7-8 to T10-11. up to moderate foraminal narrowing on the left at T7-8, T8-9 and on the right at T8-9 to T10-11. Diffusely patent spinal canal. IMPRESSION: Negative for bone lesion in the thoracic spine, including at the T12 level highlighted on prior radiography. Electronically Signed   By: Tiburcio Pea M.D.   On: 04/27/2023 13:01

## 2023-05-23 DIAGNOSIS — E1165 Type 2 diabetes mellitus with hyperglycemia: Secondary | ICD-10-CM | POA: Diagnosis not present

## 2023-05-23 DIAGNOSIS — I10 Essential (primary) hypertension: Secondary | ICD-10-CM | POA: Diagnosis not present

## 2023-07-01 DIAGNOSIS — Z1331 Encounter for screening for depression: Secondary | ICD-10-CM | POA: Diagnosis not present

## 2023-07-01 DIAGNOSIS — I480 Paroxysmal atrial fibrillation: Secondary | ICD-10-CM | POA: Diagnosis not present

## 2023-07-01 DIAGNOSIS — E1165 Type 2 diabetes mellitus with hyperglycemia: Secondary | ICD-10-CM | POA: Diagnosis not present

## 2023-07-01 DIAGNOSIS — Z0001 Encounter for general adult medical examination with abnormal findings: Secondary | ICD-10-CM | POA: Diagnosis not present

## 2023-07-01 DIAGNOSIS — M1A0710 Idiopathic chronic gout, right ankle and foot, without tophus (tophi): Secondary | ICD-10-CM | POA: Diagnosis not present

## 2023-07-01 DIAGNOSIS — I10 Essential (primary) hypertension: Secondary | ICD-10-CM | POA: Diagnosis not present

## 2023-07-01 DIAGNOSIS — N1832 Chronic kidney disease, stage 3b: Secondary | ICD-10-CM | POA: Diagnosis not present

## 2023-07-01 DIAGNOSIS — Z1389 Encounter for screening for other disorder: Secondary | ICD-10-CM | POA: Diagnosis not present

## 2023-07-13 ENCOUNTER — Ambulatory Visit: Attending: Cardiology | Admitting: Cardiology

## 2023-07-13 ENCOUNTER — Encounter: Payer: Self-pay | Admitting: Cardiology

## 2023-07-13 VITALS — BP 125/70 | HR 101 | Ht 73.0 in | Wt 398.4 lb

## 2023-07-13 DIAGNOSIS — E782 Mixed hyperlipidemia: Secondary | ICD-10-CM

## 2023-07-13 DIAGNOSIS — I10 Essential (primary) hypertension: Secondary | ICD-10-CM

## 2023-07-13 DIAGNOSIS — I4891 Unspecified atrial fibrillation: Secondary | ICD-10-CM

## 2023-07-13 DIAGNOSIS — N400 Enlarged prostate without lower urinary tract symptoms: Secondary | ICD-10-CM | POA: Diagnosis not present

## 2023-07-13 DIAGNOSIS — I5032 Chronic diastolic (congestive) heart failure: Secondary | ICD-10-CM

## 2023-07-13 NOTE — Progress Notes (Signed)
 Clinical Summary [name] is a 73 y.o.male seen today for follow up of the following medical problems.    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 Was rate controlled, plan was for outpatient DCCV in 3 weeks. At f/u mixed compliance with eliquis  and DCCV was not scheduled - admit 12/2021 with AKI and generalized weakness, issues with afib with RVR that admission   - has done well with just rate control  -no recent palpitations.  - compliant with meds - no bleeding on eliquis   - no recent palpitations. Chronic SOB/DOE x 3 months.  - no bleeding on eliquis .        2.OSA - intolerant to cpap   3. MGUS - followed by oncology   4. Hyperlipidemia  12/2021 TC 92 TG 85 HDL 31 LDL 44 - compliant with meds   5. DM2 - followed by pcp   6. Chronic HFpEF - compliant with lasix , he is on farxiga  - takes lasix  3-4 times a week.    SH: works [employer]. Used to work as Copy.  Past Medical History:  Diagnosis Date   Atrial fibrillation (HCC)    a. initialy occurring in 2013  b. recurrence in 06/2020 in the setting of COVID-19 --> s/p DCCV in 07/2020   Chronic kidney disease, stage 3a (HCC)    Diabetes mellitus, type 2 (HCC)    Dysrhythmia    GERD (gastroesophageal reflux disease)    Gout    Hypertension    Mild pulmonary hypertension (HCC)    Morbid obesity (HCC)    OSA (obstructive sleep apnea)    Paroxysmal atrial flutter (HCC)    Sleep apnea      No Known Allergies   Current Outpatient Medications  Medication Sig Dispense Refill   acetaminophen  (TYLENOL ) 500 MG tablet Take 1,000 mg by mouth every 6 (six) hours as needed for moderate pain.     allopurinol  (ZYLOPRIM ) 100 MG tablet Take 100 mg by mouth daily.     ALPRAZolam  (XANAX ) 1 MG tablet Take one tablet by mouth 1 hour prior to MRI 1 tablet 0   apixaban  (ELIQUIS ) 5 MG TABS tablet Take 1 tablet (5 mg total) by mouth 2 (two) times  daily. 60 tablet 1   atorvastatin  (LIPITOR) 20 MG tablet Take 20 mg by mouth daily.     B-D UF III MINI PEN NEEDLES 31G X 5 MM MISC      calcitRIOL (ROCALTROL) 0.25 MCG capsule Take 0.25 mcg by mouth 2 (two) times a week.     Colchicine  0.6 MG CAPS Take 1 capsule by mouth 2 (two) times daily.     diltiazem  (TIAZAC ) 360 MG 24 hr capsule Take 1 capsule (360 mg total) by mouth daily.     FARXIGA  5 MG TABS tablet      ferrous sulfate  325 (65 FE) MG EC tablet Take 325 mg by mouth daily.     finasteride (PROSCAR) 5 MG tablet Take 5 mg by mouth daily.     furosemide  (LASIX ) 40 MG tablet Take 40 mg by mouth 2 (two) times daily.     hydrALAZINE  (APRESOLINE ) 100 MG tablet Take 100 mg by mouth 2 (two) times daily.     Insulin  Degludec FlexTouch 100 UNIT/ML SOPN SMARTSIG:40 Unit(s) SUB-Q Every Night     LANTUS  SOLOSTAR 100 UNIT/ML Solostar Pen Inject 40 Units into the skin at bedtime.  losartan  (COZAAR ) 100 MG tablet Take 1 tablet (100 mg total) by mouth daily. 90 tablet 3   metoprolol  tartrate (LOPRESSOR ) 50 MG tablet Take 1 tablet (50 mg total) by mouth 2 (two) times daily.     oxymetazoline (AFRIN) 0.05 % nasal spray Place 1 spray into both nostrils 2 (two) times daily as needed for congestion.     pantoprazole  (PROTONIX ) 40 MG tablet Take 1 tablet (40 mg total) by mouth 2 (two) times daily before a meal. 30 minutes before breakfast 180 tablet 1   TRADJENTA 5 MG TABS tablet Take 5 mg by mouth daily.     No current facility-administered medications for this visit.     Past Surgical History:  Procedure Laterality Date   BALLOON DILATION N/A 12/25/2020   Procedure: BALLOON DILATION;  Surgeon: Vinetta Greening, DO;  Location: [facility];  Service: Endoscopy;  Laterality: N/A;   BIOPSY  12/25/2020   Procedure: BIOPSY;  Surgeon: Vinetta Greening, DO;  [facility]: [facility];  Service: Endoscopy;;   BIOPSY  08/05/2022   Procedure: BIOPSY;  Surgeon: Vinetta Greening, DO;  [facility];  Service: Endoscopy;;   CARDIOVERSION N/A 07/09/2020   Procedure: CARDIOVERSION;  Surgeon: Laurann Pollock, MD;  Location: [facility];  Service: Endoscopy;  Laterality: N/A;   COLONOSCOPY N/A 12/08/2016   sigmoid and descending colon diverticula, four semi-pedunculated polyps in descending and ascendign colon, few polyps in descending and ascending colon (tubular adenomas). 3 year surveillance.    COLONOSCOPY WITH PROPOFOL  N/A 12/25/2020   Surgeon: Goble Last K, DO; Nonbleeding internal hemorrhoids, pancolonic diverticulosis, 2 mm tubular adenoma.  Recommended repeat in 5 years.   COLONOSCOPY WITH PROPOFOL  N/A 08/05/2022   Procedure: COLONOSCOPY WITH PROPOFOL ;  Surgeon: Vinetta Greening, DO;  Location: [facility];  Service: Endoscopy;  Laterality: N/A;  7:30 am, ASA 3   ESOPHAGOGASTRODUODENOSCOPY N/A 12/08/2016   normal esophagus s/p empiric dilation, small hiatal hernia.    ESOPHAGOGASTRODUODENOSCOPY (EGD) WITH PROPOFOL  N/A 12/25/2020   Surgeon: Vinetta Greening, DO;  Normal esophagus s/p empiric dilation, gastritis biopsied, normal examined duodenum.  Pathology with slight chronic inflammation, no H. pylori.   ESOPHAGOGASTRODUODENOSCOPY (EGD) WITH PROPOFOL  N/A 08/05/2022   Procedure: ESOPHAGOGASTRODUODENOSCOPY (EGD) WITH PROPOFOL ;  Surgeon: Vinetta Greening, DO;  Location: [facility];  Service: Endoscopy;  Laterality: N/A;   FINGER SURGERY     left ring   INCISION AND DRAINAGE OF WOUND Left 04/20/2016   Procedure: LEFT THIGH DEBRIDEMENT;  Surgeon: Derral Flick, MD;  Location: [facility];  Service: General;  Laterality: Left;   MALONEY DILATION N/A 12/08/2016   Procedure: Dorothyann Gather;  Surgeon: Suzette Espy, MD;  Location: [facility];  Service: Endoscopy;  Laterality: N/A;   POLYPECTOMY  12/08/2016   Procedure: POLYPECTOMY;  Surgeon: Suzette Espy, MD;  [facility]: [facility];  Service: Endoscopy;;  colon   POLYPECTOMY  12/25/2020   Procedure: POLYPECTOMY;   Surgeon: Vinetta Greening, DO;  [facility];  Service: Endoscopy;;   POLYPECTOMY  08/05/2022   Procedure: POLYPECTOMY;  Surgeon: Vinetta Greening, DO;  [facility]: [facility];  Service: Endoscopy;;     No Known Allergies    Family History  Problem Relation Age of Onset   Heart failure Mother    Hypertension Mother    Hypertension Father    Stroke Father    Prostate cancer Father    Stroke Brother  Prostate cancer Brother    Colon cancer Neg Hx    Colon polyps Neg Hx      Social History Mr. Schooling reports that he has been smoking cigars. He has never used smokeless tobacco. Mr. Klym reports that he does not currently use alcohol .    Physical Examination Today's Vitals   07/13/23 1526 07/13/23 1559  BP: (!) 132/90 125/70  Pulse: (!) 101   SpO2: 98%   Weight: (!) 398 lb 6.4 oz (180.7 kg)   Height: 6\' 1"  (1.854 m)    Body mass index is 52.56 kg/m.  Gen: resting comfortably, no acute distress HEENT: no scleral icterus, pupils equal round and reactive, no palptable cervical adenopathy,  CV: irreg, no m/rg, no jvd Resp: Clear to auscultation bilaterally GI: abdomen is soft, non-tender, non-distended, normal bowel sounds, no hepatosplenomegaly MSK: extremities are warm, no edema.  Skin: warm, no rash Neuro:  no focal deficits Psych: appropriate affect   Diagnostic Studies  06/2021 echo   1. Left ventricular ejection fraction, by estimation, is 50 to 55%. The  left ventricle has low normal function. The left ventricle has no regional  wall motion abnormalities. There is moderate concentric left ventricular  hypertrophy. Diastolic function  indeterminant due to AFib.   2. Right ventricular systolic function is normal. The right ventricular  size is mildly enlarged. There is normal pulmonary artery systolic  pressure. The estimated right ventricular systolic pressure is 29.0 mmHg.   3. Left atrial size was moderately dilated.   4. The mitral  valve is normal in structure. Trivial mitral valve  regurgitation.   5. The aortic valve is tricuspid. There is mild calcification of the  aortic valve. There is mild thickening of the aortic valve. Aortic valve  regurgitation is trivial. Aortic valve sclerosis/calcification is present,  without any evidence of aortic  stenosis.   6. The inferior vena cava is dilated in size with >50% respiratory  variability, suggesting right atrial pressure of 8 mmHg.    Assessment and Plan   1.Long standing persistent aflutter/acquired thrombophilia - We had not pursued repeat DCCV in the past due to mixed compliance with anticoagulation. He has been rate controlled and asymptomatic at recent visits and thus will no longer pursue -rates ok on current regimen, continue current meds - continue eliquis  for stroke prevention.  - EKG shows afib 101   2.HTN -at goal, continue current meds  3. Hyperliidemia - request labs from pcp   4. Chronic HFpEF - mild edema today, lungs are clear. Perhaps LE edema more related to obesity, also reports prior surgery on right leg with chronic swelling - overall chronic stable LE edema, only taking lasix  about 3-4 times per week. Encouraged to take more regularly, difficult to take he reports while working.      Laurann Pollock, M.D.

## 2023-07-13 NOTE — Patient Instructions (Signed)
 Medication Instructions:  Your physician recommends that you continue on your current medications as directed. Please refer to the Current Medication list given to you today.   Labwork: Requested labs from Primary care physician   Testing/Procedures: None  Follow-Up: Your physician recommends that you schedule a follow-up appointment in: 6 months  Any Other Special Instructions Will Be Listed Below (If Applicable). Thank you for choosing [facility]!     If you need a refill on your cardiac medications before your next appointment, please call your pharmacy.

## 2023-07-15 ENCOUNTER — Encounter: Payer: Self-pay | Admitting: Cardiology

## 2023-07-20 DIAGNOSIS — E211 Secondary hyperparathyroidism, not elsewhere classified: Secondary | ICD-10-CM | POA: Diagnosis not present

## 2023-07-20 DIAGNOSIS — D631 Anemia in chronic kidney disease: Secondary | ICD-10-CM | POA: Diagnosis not present

## 2023-07-20 DIAGNOSIS — N401 Enlarged prostate with lower urinary tract symptoms: Secondary | ICD-10-CM | POA: Diagnosis not present

## 2023-07-20 DIAGNOSIS — N189 Chronic kidney disease, unspecified: Secondary | ICD-10-CM | POA: Diagnosis not present

## 2023-07-20 DIAGNOSIS — R351 Nocturia: Secondary | ICD-10-CM | POA: Diagnosis not present

## 2023-07-20 DIAGNOSIS — R809 Proteinuria, unspecified: Secondary | ICD-10-CM | POA: Diagnosis not present

## 2023-07-20 DIAGNOSIS — Z125 Encounter for screening for malignant neoplasm of prostate: Secondary | ICD-10-CM | POA: Diagnosis not present

## 2023-07-31 DIAGNOSIS — I10 Essential (primary) hypertension: Secondary | ICD-10-CM | POA: Diagnosis not present

## 2023-07-31 DIAGNOSIS — E1165 Type 2 diabetes mellitus with hyperglycemia: Secondary | ICD-10-CM | POA: Diagnosis not present

## 2023-08-31 DIAGNOSIS — E1129 Type 2 diabetes mellitus with other diabetic kidney complication: Secondary | ICD-10-CM | POA: Diagnosis not present

## 2023-08-31 DIAGNOSIS — E1165 Type 2 diabetes mellitus with hyperglycemia: Secondary | ICD-10-CM | POA: Diagnosis not present

## 2023-08-31 DIAGNOSIS — N1832 Chronic kidney disease, stage 3b: Secondary | ICD-10-CM | POA: Diagnosis not present

## 2023-08-31 DIAGNOSIS — R809 Proteinuria, unspecified: Secondary | ICD-10-CM | POA: Diagnosis not present

## 2023-08-31 DIAGNOSIS — I10 Essential (primary) hypertension: Secondary | ICD-10-CM | POA: Diagnosis not present

## 2023-08-31 DIAGNOSIS — E1122 Type 2 diabetes mellitus with diabetic chronic kidney disease: Secondary | ICD-10-CM | POA: Diagnosis not present

## 2023-09-07 ENCOUNTER — Other Ambulatory Visit: Payer: Self-pay

## 2023-09-07 ENCOUNTER — Emergency Department (HOSPITAL_COMMUNITY)

## 2023-09-07 ENCOUNTER — Encounter (HOSPITAL_COMMUNITY): Payer: Self-pay | Admitting: *Deleted

## 2023-09-07 ENCOUNTER — Inpatient Hospital Stay (HOSPITAL_COMMUNITY)
Admission: EM | Admit: 2023-09-07 | Discharge: 2023-09-10 | DRG: 690 | Disposition: A | Discharge status: Home / Self Care | Attending: Family Medicine | Admitting: Family Medicine

## 2023-09-07 DIAGNOSIS — N1832 Chronic kidney disease, stage 3b: Secondary | ICD-10-CM | POA: Diagnosis present

## 2023-09-07 DIAGNOSIS — I480 Paroxysmal atrial fibrillation: Secondary | ICD-10-CM | POA: Diagnosis present

## 2023-09-07 DIAGNOSIS — Z87891 Personal history of nicotine dependence: Secondary | ICD-10-CM | POA: Diagnosis not present

## 2023-09-07 DIAGNOSIS — Z79899 Other long term (current) drug therapy: Secondary | ICD-10-CM | POA: Diagnosis not present

## 2023-09-07 DIAGNOSIS — K219 Gastro-esophageal reflux disease without esophagitis: Secondary | ICD-10-CM | POA: Diagnosis present

## 2023-09-07 DIAGNOSIS — Z860101 Personal history of adenomatous and serrated colon polyps: Secondary | ICD-10-CM

## 2023-09-07 DIAGNOSIS — N3289 Other specified disorders of bladder: Secondary | ICD-10-CM | POA: Diagnosis not present

## 2023-09-07 DIAGNOSIS — D72829 Elevated white blood cell count, unspecified: Secondary | ICD-10-CM | POA: Diagnosis not present

## 2023-09-07 DIAGNOSIS — D6859 Other primary thrombophilia: Secondary | ICD-10-CM | POA: Diagnosis present

## 2023-09-07 DIAGNOSIS — D472 Monoclonal gammopathy: Secondary | ICD-10-CM | POA: Diagnosis present

## 2023-09-07 DIAGNOSIS — E66813 Obesity, class 3: Secondary | ICD-10-CM | POA: Diagnosis present

## 2023-09-07 DIAGNOSIS — R000 Tachycardia, unspecified: Secondary | ICD-10-CM | POA: Diagnosis present

## 2023-09-07 DIAGNOSIS — I272 Pulmonary hypertension, unspecified: Secondary | ICD-10-CM | POA: Diagnosis present

## 2023-09-07 DIAGNOSIS — Z8249 Family history of ischemic heart disease and other diseases of the circulatory system: Secondary | ICD-10-CM

## 2023-09-07 DIAGNOSIS — Z8042 Family history of malignant neoplasm of prostate: Secondary | ICD-10-CM

## 2023-09-07 DIAGNOSIS — Z7901 Long term (current) use of anticoagulants: Secondary | ICD-10-CM | POA: Diagnosis not present

## 2023-09-07 DIAGNOSIS — I4892 Unspecified atrial flutter: Secondary | ICD-10-CM | POA: Diagnosis present

## 2023-09-07 DIAGNOSIS — K8689 Other specified diseases of pancreas: Secondary | ICD-10-CM | POA: Diagnosis not present

## 2023-09-07 DIAGNOSIS — E785 Hyperlipidemia, unspecified: Secondary | ICD-10-CM | POA: Diagnosis present

## 2023-09-07 DIAGNOSIS — Z823 Family history of stroke: Secondary | ICD-10-CM

## 2023-09-07 DIAGNOSIS — N390 Urinary tract infection, site not specified: Secondary | ICD-10-CM | POA: Diagnosis not present

## 2023-09-07 DIAGNOSIS — I130 Hypertensive heart and chronic kidney disease with heart failure and stage 1 through stage 4 chronic kidney disease, or unspecified chronic kidney disease: Secondary | ICD-10-CM | POA: Diagnosis present

## 2023-09-07 DIAGNOSIS — E1122 Type 2 diabetes mellitus with diabetic chronic kidney disease: Secondary | ICD-10-CM | POA: Diagnosis not present

## 2023-09-07 DIAGNOSIS — G4733 Obstructive sleep apnea (adult) (pediatric): Secondary | ICD-10-CM | POA: Diagnosis present

## 2023-09-07 DIAGNOSIS — Z794 Long term (current) use of insulin: Secondary | ICD-10-CM

## 2023-09-07 DIAGNOSIS — R109 Unspecified abdominal pain: Secondary | ICD-10-CM | POA: Diagnosis not present

## 2023-09-07 DIAGNOSIS — Z6841 Body Mass Index (BMI) 40.0 and over, adult: Secondary | ICD-10-CM | POA: Diagnosis not present

## 2023-09-07 DIAGNOSIS — R590 Localized enlarged lymph nodes: Secondary | ICD-10-CM | POA: Diagnosis not present

## 2023-09-07 DIAGNOSIS — R1310 Dysphagia, unspecified: Secondary | ICD-10-CM | POA: Diagnosis present

## 2023-09-07 DIAGNOSIS — I10 Essential (primary) hypertension: Secondary | ICD-10-CM | POA: Diagnosis present

## 2023-09-07 DIAGNOSIS — Z87440 Personal history of urinary (tract) infections: Secondary | ICD-10-CM

## 2023-09-07 DIAGNOSIS — M109 Gout, unspecified: Secondary | ICD-10-CM | POA: Diagnosis present

## 2023-09-07 DIAGNOSIS — R531 Weakness: Secondary | ICD-10-CM

## 2023-09-07 DIAGNOSIS — N3000 Acute cystitis without hematuria: Secondary | ICD-10-CM | POA: Diagnosis not present

## 2023-09-07 DIAGNOSIS — I5032 Chronic diastolic (congestive) heart failure: Secondary | ICD-10-CM | POA: Diagnosis not present

## 2023-09-07 DIAGNOSIS — I4891 Unspecified atrial fibrillation: Secondary | ICD-10-CM | POA: Diagnosis not present

## 2023-09-07 DIAGNOSIS — Z8673 Personal history of transient ischemic attack (TIA), and cerebral infarction without residual deficits: Secondary | ICD-10-CM | POA: Diagnosis not present

## 2023-09-07 DIAGNOSIS — E1121 Type 2 diabetes mellitus with diabetic nephropathy: Secondary | ICD-10-CM | POA: Diagnosis not present

## 2023-09-07 DIAGNOSIS — E119 Type 2 diabetes mellitus without complications: Secondary | ICD-10-CM | POA: Diagnosis present

## 2023-09-07 DIAGNOSIS — N189 Chronic kidney disease, unspecified: Secondary | ICD-10-CM | POA: Diagnosis not present

## 2023-09-07 DIAGNOSIS — I129 Hypertensive chronic kidney disease with stage 1 through stage 4 chronic kidney disease, or unspecified chronic kidney disease: Secondary | ICD-10-CM | POA: Diagnosis not present

## 2023-09-07 LAB — BASIC METABOLIC PANEL WITH GFR
Anion gap: 13 (ref 5–15)
BUN: 21 mg/dL (ref 8–23)
CO2: 22 mmol/L (ref 22–32)
Calcium: 9.4 mg/dL (ref 8.9–10.3)
Chloride: 100 mmol/L (ref 98–111)
Creatinine, Ser: 1.82 mg/dL — ABNORMAL HIGH (ref 0.61–1.24)
GFR, Estimated: 39 mL/min — ABNORMAL LOW (ref >60)
Glucose, Bld: 155 mg/dL — ABNORMAL HIGH (ref 70–99)
Potassium: 3.8 mmol/L (ref 3.5–5.1)
Sodium: 135 mmol/L (ref 135–145)

## 2023-09-07 LAB — CBC WITH DIFFERENTIAL/PLATELET
Abs Immature Granulocytes: 0.08 10*3/uL — ABNORMAL HIGH (ref 0.00–0.07)
Basophils Absolute: 0.1 10*3/uL (ref 0.0–0.1)
Basophils Relative: 0 %
Eosinophils Absolute: 0.1 10*3/uL (ref 0.0–0.5)
Eosinophils Relative: 1 %
HCT: 48.3 % (ref 39.0–52.0)
Hemoglobin: 15.6 g/dL (ref 13.0–17.0)
Immature Granulocytes: 1 %
Lymphocytes Relative: 25 %
Lymphs Abs: 4.1 10*3/uL — ABNORMAL HIGH (ref 0.7–4.0)
MCH: 30.5 pg (ref 26.0–34.0)
MCHC: 32.3 g/dL (ref 30.0–36.0)
MCV: 94.5 fL (ref 80.0–100.0)
Monocytes Absolute: 1.5 10*3/uL — ABNORMAL HIGH (ref 0.1–1.0)
Monocytes Relative: 9 %
Neutro Abs: 10.9 10*3/uL — ABNORMAL HIGH (ref 1.7–7.7)
Neutrophils Relative %: 64 %
Platelets: 339 10*3/uL (ref 150–400)
RBC: 5.11 MIL/uL (ref 4.22–5.81)
RDW: 14.3 % (ref 11.5–15.5)
WBC: 16.7 10*3/uL — ABNORMAL HIGH (ref 4.0–10.5)
nRBC: 0 % (ref 0.0–0.2)

## 2023-09-07 LAB — URINALYSIS, ROUTINE W REFLEX MICROSCOPIC
Bilirubin Urine: NEGATIVE
Glucose, UA: >=500 mg/dL — AB
Hgb urine dipstick: NEGATIVE
Ketones, ur: NEGATIVE mg/dL
Nitrite: NEGATIVE
Protein, ur: 30 mg/dL — AB
Specific Gravity, Urine: 1.015 (ref 1.005–1.030)
WBC, UA: >50 WBC/hpf (ref 0–5)
pH: 5 (ref 5.0–8.0)

## 2023-09-07 LAB — GLUCOSE, CAPILLARY
Glucose-Capillary: 141 mg/dL — ABNORMAL HIGH (ref 70–99)
Glucose-Capillary: 93 mg/dL (ref 70–99)
Glucose-Capillary: 105 mg/dL — ABNORMAL HIGH (ref 70–99)

## 2023-09-07 LAB — HEMOGLOBIN A1C
Hgb A1c MFr Bld: 8.3 % — ABNORMAL HIGH (ref 4.8–5.6)
Mean Plasma Glucose: 191.51 mg/dL

## 2023-09-07 LAB — MRSA NEXT GEN BY PCR, NASAL: MRSA by PCR Next Gen: NOT DETECTED

## 2023-09-07 MED ORDER — HYDRALAZINE HCL 50 MG PO TABS
100.0000 mg | ORAL_TABLET | Freq: Two times a day (BID) | ORAL | Status: DC
  Administered 2023-09-07 – 2023-09-10 (×6): 100 mg via ORAL
  Filled 2023-09-07 (×7): qty 2

## 2023-09-07 MED ORDER — INSULIN ASPART 100 UNIT/ML IJ SOLN
6.0000 [IU] | Freq: Three times a day (TID) | INTRAMUSCULAR | Status: DC
  Administered 2023-09-07 – 2023-09-09 (×7): 6 [IU] via SUBCUTANEOUS

## 2023-09-07 MED ORDER — METOPROLOL TARTRATE 50 MG PO TABS
50.0000 mg | ORAL_TABLET | Freq: Two times a day (BID) | ORAL | Status: DC
  Administered 2023-09-07 – 2023-09-10 (×6): 50 mg via ORAL
  Filled 2023-09-07 (×6): qty 1

## 2023-09-07 MED ORDER — INSULIN GLARGINE-YFGN 100 UNIT/ML ~~LOC~~ SOLN
30.0000 [IU] | Freq: Every day | SUBCUTANEOUS | Status: DC
  Administered 2023-09-07: 30 [IU] via SUBCUTANEOUS
  Filled 2023-09-07 (×2): qty 0.3

## 2023-09-07 MED ORDER — SODIUM CHLORIDE 0.9 % IV SOLN
2.0000 g | INTRAVENOUS | Status: DC
  Administered 2023-09-08 – 2023-09-09 (×2): 2 g via INTRAVENOUS
  Filled 2023-09-07 (×3): qty 20

## 2023-09-07 MED ORDER — APIXABAN 5 MG PO TABS
5.0000 mg | ORAL_TABLET | Freq: Two times a day (BID) | ORAL | Status: DC
  Administered 2023-09-07 – 2023-09-10 (×7): 5 mg via ORAL
  Filled 2023-09-07 (×7): qty 1

## 2023-09-07 MED ORDER — OXYCODONE HCL 5 MG PO TABS
5.0000 mg | ORAL_TABLET | ORAL | Status: DC | PRN
  Administered 2023-09-08 – 2023-09-09 (×6): 5 mg via ORAL
  Filled 2023-09-07 (×6): qty 1

## 2023-09-07 MED ORDER — TRAZODONE HCL 50 MG PO TABS
25.0000 mg | ORAL_TABLET | Freq: Every evening | ORAL | Status: DC | PRN
Start: 2023-09-07 — End: 2023-09-10
  Administered 2023-09-07 – 2023-09-09 (×3): 25 mg via ORAL
  Filled 2023-09-07 (×3): qty 1

## 2023-09-07 MED ORDER — DILTIAZEM LOAD VIA INFUSION
20.0000 mg | Freq: Once | INTRAVENOUS | Status: AC
  Administered 2023-09-07: 20 mg via INTRAVENOUS

## 2023-09-07 MED ORDER — ONDANSETRON HCL 4 MG/2ML IJ SOLN: 4.0000 mg | Freq: Four times a day (QID) | INTRAMUSCULAR | Status: DC | PRN

## 2023-09-07 MED ORDER — SODIUM CHLORIDE 0.9 % IV SOLN
2.0000 g | Freq: Once | INTRAVENOUS | Status: AC
  Administered 2023-09-07: 2 g via INTRAVENOUS
  Filled 2023-09-07: qty 20

## 2023-09-07 MED ORDER — INSULIN ASPART 100 UNIT/ML IJ SOLN: 0.0000 [IU] | Freq: Every day | INTRAMUSCULAR | Status: DC

## 2023-09-07 MED ORDER — ALLOPURINOL 100 MG PO TABS
200.0000 mg | ORAL_TABLET | Freq: Every day | ORAL | Status: DC
  Administered 2023-09-07 – 2023-09-10 (×4): 200 mg via ORAL
  Filled 2023-09-07 (×4): qty 2

## 2023-09-07 MED ORDER — LACTATED RINGERS IV BOLUS
1000.0000 mL | Freq: Once | INTRAVENOUS | Status: AC
  Administered 2023-09-07: 1000 mL via INTRAVENOUS

## 2023-09-07 MED ORDER — DILTIAZEM HCL ER COATED BEADS 240 MG PO CP24
360.0000 mg | ORAL_CAPSULE | Freq: Every day | ORAL | Status: DC
  Administered 2023-09-08 – 2023-09-10 (×3): 360 mg via ORAL
  Filled 2023-09-07 (×3): qty 1

## 2023-09-07 MED ORDER — LOSARTAN POTASSIUM 50 MG PO TABS
100.0000 mg | ORAL_TABLET | Freq: Every day | ORAL | Status: DC
  Administered 2023-09-08: 100 mg via ORAL
  Filled 2023-09-07: qty 2

## 2023-09-07 MED ORDER — ONDANSETRON HCL 4 MG PO TABS: 4.0000 mg | ORAL_TABLET | Freq: Four times a day (QID) | ORAL | Status: DC | PRN

## 2023-09-07 MED ORDER — LEVALBUTEROL HCL 0.63 MG/3ML IN NEBU: 0.6300 mg | INHALATION_SOLUTION | Freq: Four times a day (QID) | RESPIRATORY_TRACT | Status: DC | PRN

## 2023-09-07 MED ORDER — PANTOPRAZOLE SODIUM 40 MG PO TBEC
40.0000 mg | DELAYED_RELEASE_TABLET | Freq: Two times a day (BID) | ORAL | Status: DC
  Administered 2023-09-07 – 2023-09-10 (×6): 40 mg via ORAL
  Filled 2023-09-07 (×6): qty 1

## 2023-09-07 MED ORDER — ACETAMINOPHEN 325 MG PO TABS: 650.0000 mg | ORAL_TABLET | Freq: Four times a day (QID) | ORAL | Status: DC | PRN

## 2023-09-07 MED ORDER — INSULIN ASPART 100 UNIT/ML IJ SOLN
0.0000 [IU] | Freq: Three times a day (TID) | INTRAMUSCULAR | Status: DC
  Administered 2023-09-07: 3 [IU] via SUBCUTANEOUS
  Administered 2023-09-08: 4 [IU] via SUBCUTANEOUS
  Administered 2023-09-09 – 2023-09-10 (×2): 3 [IU] via SUBCUTANEOUS

## 2023-09-07 MED ORDER — CHLORHEXIDINE GLUCONATE CLOTH 2 % EX PADS
6.0000 | MEDICATED_PAD | Freq: Every day | CUTANEOUS | Status: DC
  Administered 2023-09-07 – 2023-09-10 (×4): 6 via TOPICAL

## 2023-09-07 MED ORDER — ACETAMINOPHEN 650 MG RE SUPP: 650.0000 mg | Freq: Four times a day (QID) | RECTAL | Status: DC | PRN

## 2023-09-07 MED ORDER — DILTIAZEM HCL-DEXTROSE 125-5 MG/125ML-% IV SOLN (PREMIX)
INTRAVENOUS | Status: AC
  Administered 2023-09-07: 5 mg/h via INTRAVENOUS
  Filled 2023-09-07: qty 125

## 2023-09-07 MED ORDER — BISACODYL 5 MG PO TBEC
5.0000 mg | DELAYED_RELEASE_TABLET | Freq: Every day | ORAL | Status: DC | PRN
Start: 2023-09-07 — End: 2023-09-10

## 2023-09-07 MED ORDER — FUROSEMIDE 40 MG PO TABS
40.0000 mg | ORAL_TABLET | Freq: Two times a day (BID) | ORAL | Status: DC
  Administered 2023-09-08 – 2023-09-09 (×4): 40 mg via ORAL
  Filled 2023-09-07 (×5): qty 1

## 2023-09-07 MED ORDER — DILTIAZEM HCL-DEXTROSE 125-5 MG/125ML-% IV SOLN (PREMIX)
5.0000 mg/h | INTRAVENOUS | Status: DC
  Administered 2023-09-07: 15 mg/h via INTRAVENOUS
  Administered 2023-09-08: 10 mg/h via INTRAVENOUS
  Administered 2023-09-08: 15 mg/h via INTRAVENOUS
  Filled 2023-09-07 (×3): qty 125

## 2023-09-07 MED ORDER — ATORVASTATIN CALCIUM 20 MG PO TABS
20.0000 mg | ORAL_TABLET | Freq: Every evening | ORAL | Status: DC
  Administered 2023-09-07 – 2023-09-09 (×3): 20 mg via ORAL
  Filled 2023-09-07 (×3): qty 1

## 2023-09-07 MED ORDER — FENTANYL CITRATE PF 50 MCG/ML IJ SOSY
25.0000 ug | PREFILLED_SYRINGE | INTRAMUSCULAR | Status: DC | PRN
  Filled 2023-09-07: qty 1

## 2023-09-07 NOTE — H&P (Signed)
 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  He says that he was prescribed antibiotics by his PCP several days ago and he has been taking the medication for the last couple of days with no significant improvement.  He is having flank pain 7/10.  He reports that he has been increasingly weak and diaphoretic and short of breath.  He did not take his medications this morning.  On arrival to the ED he was noted to be in A-fib with RVR.  He was started on an IV diltiazem  infusion.  He was sent for CT scan stone study with findings concerning for cystitis.  His urinalysis was positive suggesting infection.  A urine culture was sent.  Patient was given IV fluids for hydration and IV antibiotics and admission was requested.   Past Medical History:  Diagnosis Date   Atrial fibrillation (HCC)    a. initialy occurring in 2013  b. recurrence in 06/2020 in the setting of COVID-19 --> s/p DCCV in 07/2020   Chronic kidney disease, stage 3a (HCC)    Diabetes mellitus, type 2 (HCC)    Dysrhythmia    GERD (gastroesophageal reflux disease)    Gout    Hypertension    Mild pulmonary hypertension (HCC)    Morbid obesity (HCC)    OSA (obstructive sleep apnea)    Paroxysmal atrial flutter (HCC)    Sleep apnea     Past Surgical History:  Procedure Laterality  Date   BALLOON DILATION N/A 12/25/2020   Procedure: BALLOON DILATION;  Surgeon: Cindie Carlin POUR, DO;  [facility];  Service: Endoscopy;  Laterality: N/A;   BIOPSY  12/25/2020   Procedure: BIOPSY;  Surgeon: Cindie Carlin POUR, DO;  [facility];  Service: Endoscopy;;   BIOPSY  08/05/2022   Procedure: BIOPSY;  Surgeon: Cindie Carlin POUR, DO;  [facility];  Service: Endoscopy;;   CARDIOVERSION N/A 07/09/2020   Procedure: CARDIOVERSION;  Surgeon: Alvan Dorn FALCON, MD;  Location: [facility];  Service: Endoscopy;  Laterality: N/A;   COLONOSCOPY N/A 12/08/2016   sigmoid and descending colon diverticula, four semi-pedunculated polyps in descending and ascendign colon, few polyps in descending and ascending colon (tubular adenomas). 3 year surveillance.    COLONOSCOPY WITH PROPOFOL  N/A 12/25/2020   Surgeon: Cindie Carlin K, DO; Nonbleeding internal hemorrhoids, pancolonic diverticulosis, 2 mm tubular adenoma.  Recommended repeat in 5 years.   COLONOSCOPY WITH PROPOFOL  N/A 08/05/2022   Procedure: COLONOSCOPY WITH PROPOFOL ;  Surgeon: Cindie Carlin POUR, DO;  [facility];  Service: Endoscopy;  Laterality: N/A;  7:30 am, ASA 3   ESOPHAGOGASTRODUODENOSCOPY N/A 12/08/2016   normal esophagus s/p empiric dilation, small hiatal hernia.    ESOPHAGOGASTRODUODENOSCOPY (EGD) WITH PROPOFOL  N/A 12/25/2020   Surgeon: Cindie Carlin POUR, DO;  Normal esophagus s/p empiric dilation, gastritis biopsied, normal examined duodenum.  Pathology with slight chronic inflammation, no H. pylori.  ESOPHAGOGASTRODUODENOSCOPY (EGD) WITH PROPOFOL  N/A 08/05/2022   Procedure: ESOPHAGOGASTRODUODENOSCOPY (EGD) WITH PROPOFOL ;  Surgeon: Cindie Carlin POUR, DO;  [facility];  Service: Endoscopy;  Laterality: N/A;   FINGER SURGERY     left ring   INCISION AND DRAINAGE OF WOUND Left 04/20/2016   Procedure: LEFT THIGH DEBRIDEMENT;  Surgeon: Herlene Beverley Bureau, MD;  Location: [facility] OR;   Service: General;  Laterality: Left;   MALONEY DILATION N/A 12/08/2016   Procedure: AGAPITO HODGKIN;  Surgeon: Shaaron Lamar HERO, MD;  [facility];  Service: Endoscopy;  Laterality: N/A;   POLYPECTOMY  12/08/2016   Procedure: POLYPECTOMY;  Surgeon: Shaaron Lamar HERO, MD;  [facility];  Service: Endoscopy;;  colon   POLYPECTOMY  12/25/2020   Procedure: POLYPECTOMY;  Surgeon: Cindie Carlin POUR, DO;  [facility];  Service: Endoscopy;;   POLYPECTOMY  08/05/2022   Procedure: POLYPECTOMY;  Surgeon: Cindie Carlin POUR, DO;  [facility];  Service: Endoscopy;;     reports that he has been smoking cigars. He has never used smokeless tobacco. He reports that he does not currently use alcohol . He reports that he does not use drugs.  No Known Allergies  Family History  Problem Relation Age of Onset   Heart failure Mother    Hypertension Mother    Hypertension Father    Stroke Father    Prostate cancer Father    Stroke Brother    Prostate cancer Brother    Colon cancer Neg Hx    Colon polyps Neg Hx     Prior to Admission medications   Medication Sig Start Date End Date Taking? Authorizing Provider  acetaminophen  (TYLENOL ) 500 MG tablet Take 1,000 mg by mouth every 6 (six) hours as needed for moderate pain.   Yes [provider]  Allopurinol  200 MG TABS Take 1 tablet by mouth daily. 07/01/23  Yes [provider]  apixaban  (ELIQUIS ) 5 MG TABS tablet Take 1 tablet (5 mg total) by mouth 2 (two) times daily. 06/19/21  Yes Cruz Bong L, MD  atorvastatin  (LIPITOR) 20 MG tablet Take 20 mg by mouth daily.   Yes [provider]  calcitRIOL (ROCALTROL) 0.25 MCG capsule Take 0.25 mcg by mouth 2 (two) times a week. 03/30/23  Yes [provider]  cephALEXin  (KEFLEX ) 500 MG capsule Take 500 mg by mouth 2 (two) times daily. 09/05/23  Yes [provider]  diltiazem  (TIAZAC ) 360 MG 24 hr capsule Take 1 capsule (360 mg  total) by mouth daily. 02/12/23  Yes Ricky Fines, MD  FARXIGA  5 MG TABS tablet Take 5 mg by mouth daily. 03/21/23  Yes [provider]  ferrous sulfate  325 (65 FE) MG EC tablet Take 325 mg by mouth daily. 04/30/22  Yes [provider]  furosemide  (LASIX ) 40 MG tablet Take 40 mg by mouth 2 (two) times daily.   Yes [provider]  hydrALAZINE  (APRESOLINE ) 100 MG tablet Take 100 mg by mouth 2 (two) times daily.   Yes [provider]  LANTUS  SOLOSTAR 100 UNIT/ML Solostar Pen Inject 40 Units into the skin at bedtime. 05/18/22  Yes [provider]  losartan  (COZAAR ) 100 MG tablet Take 1 tablet (100 mg total) by mouth daily. 10/27/20  Yes Strader, [place] M, PA-C  metoprolol  tartrate (LOPRESSOR ) 50 MG tablet Take 1 tablet (50 mg total) by mouth 2 (two) times daily. 02/12/23  Yes Ricky Fines, MD  pantoprazole  (PROTONIX ) 40 MG tablet Take 1 tablet (  40 mg total) by mouth 2 (two) times daily before a meal. 30 minutes before breakfast 12/25/20  Yes Cindie Carlin POUR, DO  ALPRAZolam  (XANAX ) 1 MG tablet Take one tablet by mouth 1 hour prior to MRI 04/13/23   Rogers Hai, MD  B-D UF III MINI PEN NEEDLES 31G X 5 MM MISC  01/31/23   [provider]  Colchicine  0.6 MG CAPS Take 1 capsule by mouth 2 (two) times daily. Patient not taking: Reported on 09/07/2023 09/06/22   [provider]  finasteride (PROSCAR) 5 MG tablet Take 5 mg by mouth daily. Patient not taking: Reported on 09/07/2023 04/15/23   [provider]  Insulin  Degludec FlexTouch 100 UNIT/ML SOPN SMARTSIG:40 Unit(s) SUB-Q Every Night 09/21/22   [provider]    Physical Exam: Vitals:   09/07/23 1039 09/07/23 1040 09/07/23 1041 09/07/23 1045  BP:    105/87  Pulse: 94     Resp: (!) 27 (!) 26 (!) 21 16  Temp:      TempSrc:      SpO2: 97% 96% 97% 98%  Weight:      Height:       Constitutional: morbidly obese male, awake, alert, cooperative, NAD, calm,  comfortable Eyes: PERRL, lids and conjunctivae normal ENMT: Mucous membranes are moist. Posterior pharynx clear of any exudate or lesions.Normal dentition.  Neck: normal, supple, no masses, no thyromegaly Respiratory: clear to auscultation bilaterally, no wheezing, no crackles. Normal respiratory effort. No accessory muscle use.  Cardiovascular: irregularly irregular, tachycardic rate, normal s1, s2 sounds, no murmurs / rubs / gallops. 1+ extremity edema. 2+ pedal pulses. No carotid bruits.  Abdomen: morbidly obese, no tenderness, no masses palpated. No hepatosplenomegaly. Bowel sounds positive.  Musculoskeletal: no clubbing / cyanosis. No joint deformity upper and lower extremities. Good ROM, no contractures. Normal muscle tone.  Skin: no rashes, lesions, ulcers. No induration Neurologic: CN 2-12 grossly intact. Sensation intact, DTR normal. Strength 5/5 in all 4.  Psychiatric: Normal judgment and insight. Alert and oriented x 3. Normal mood.   Labs on Admission: I have personally reviewed following labs and imaging studies  CBC: Recent Labs  Lab 09/07/23 0745  WBC 16.7*  NEUTROABS 10.9*  HGB 15.6  HCT 48.3  MCV 94.5  PLT 339   Basic Metabolic Panel: Recent Labs  Lab 09/07/23 0745  NA 135  K 3.8  CL 100  CO2 22  GLUCOSE 155*  BUN 21  CREATININE 1.82*  CALCIUM  9.4   GFR: Estimated Creatinine Clearance: 64.2 mL/min (A) (by C-G formula based on SCr of 1.82 mg/dL (H)). Liver Function Tests: No results for input(s): AST, ALT, ALKPHOS, BILITOT, PROT, ALBUMIN in the last 168 hours. No results for input(s): LIPASE, AMYLASE in the last 168 hours. No results for input(s): AMMONIA in the last 168 hours. Coagulation Profile: No results for input(s): INR, PROTIME in the last 168 hours. Cardiac Enzymes: No results for input(s): CKTOTAL, CKMB, CKMBINDEX, TROPONINI in the last 168 hours. BNP (last 3 results) No results for input(s): PROBNP in the last  8760 hours. HbA1C: No results for input(s): HGBA1C in the last 72 hours. CBG: No results for input(s): GLUCAP in the last 168 hours. Lipid Profile: No results for input(s): CHOL, HDL, LDLCALC, TRIG, CHOLHDL, LDLDIRECT in the last 72 hours. Thyroid  Function Tests: No results for input(s): TSH, T4TOTAL, FREET4, T3FREE, THYROIDAB in the last 72 hours. Anemia Panel: No results for input(s): VITAMINB12, FOLATE, FERRITIN, TIBC, IRON, RETICCTPCT in the last 72 hours. Urine  analysis:    Component Value Date/Time   COLORURINE YELLOW 09/07/2023 0805   APPEARANCEUR HAZY (A) 09/07/2023 0805   LABSPEC 1.015 09/07/2023 0805   PHURINE 5.0 09/07/2023 0805   GLUCOSEU >=500 (A) 09/07/2023 0805   HGBUR NEGATIVE 09/07/2023 0805   BILIRUBINUR NEGATIVE 09/07/2023 0805   KETONESUR NEGATIVE 09/07/2023 0805   PROTEINUR 30 (A) 09/07/2023 0805   UROBILINOGEN 0.2 07/16/2011 1407   NITRITE NEGATIVE 09/07/2023 0805   LEUKOCYTESUR LARGE (A) 09/07/2023 0805    Radiological Exams on Admission: CT Renal Stone Study Result Date: 09/07/2023 CLINICAL DATA:  Abdominal/flank pain, stone suspected uti, urgency, r/o ureteral stone or other process EXAM: CT ABDOMEN AND PELVIS WITHOUT CONTRAST TECHNIQUE: Multidetector CT imaging of the abdomen and pelvis was performed following the standard protocol without IV contrast. RADIATION DOSE REDUCTION: This exam was performed according to the departmental dose-optimization program which includes automated exposure control, adjustment of the mA and/or kV according to patient size and/or use of iterative reconstruction technique. COMPARISON:  CT pelvis dated 04/19/2016. FINDINGS: Lower chest: No acute abnormality. Hepatobiliary: No focal liver abnormality is seen. No gallstones, gallbladder wall thickening, or biliary dilatation. Pancreas: Calcifications in the pancreatic head may reflect sequela of chronic pancreatitis. Ductal dilation or surrounding  inflammatory changes. Spleen: Normal in size without focal abnormality. Adrenals/Urinary Tract: The adrenal glands are unremarkable. No urolithiasis or hydronephrosis. Bladder is partially distended with circumferential bladder wall thickening. Stomach/Bowel: Stomach is within normal limits. Appendix appears normal. No evidence of bowel wall thickening, distention, or inflammatory changes. Vascular/Lymphatic: Abdominal aorta is normal in caliber with atherosclerotic calcification. Similar enlarged left iliac node measures 14 mm in short axis (2:63), not significantly changed since prior exam dated 04/19/2016. Reproductive: Prostate is unremarkable. Other: No abdominal wall hernia. No abdominopelvic ascites. No intraperitoneal free air. Musculoskeletal: No acute osseous abnormality. No suspicious osseous lesion. Degenerative disc changes at L5-S1. IMPRESSION: 1. No evidence of urolithiasis or hydronephrosis. 2. Circumferential bladder wall thickening is suspicious for cystitis. Recommend correlation with urinalysis. 3. Enlarged left iliac node is nonspecific and may be reactive, similar to the prior exam prior. 4.  Aortic Atherosclerosis (ICD10-I70.0). Electronically Signed   By: Harrietta Sherry M.D.   On: 09/07/2023 10:08   EKG: Independently reviewed. Atrial fibrillation with RVR   Assessment/Plan Principal Problem:   Atrial fibrillation with RVR (HCC) Active Problems:   Acute cystitis   Morbid obesity with body mass index (BMI) greater than or equal to 50 (HCC)   Hypertension   Long term (current) use of anticoagulants   Dysphagia   Gastroesophageal reflux disease   A-fib (HCC)   Tachycardia   Type 2 diabetes with nephropathy (HCC)   Essential hypertension   History of CVA (cerebrovascular accident)   Stage 3b chronic kidney disease (CKD) (HCC)   Generalized weakness   Leukocytosis   Atrial fibrillation with RVR -- pt did not take morning meds: diltiazem  CD 360 mg and metoprolol   -- he is  now in IV diltiazem  infusion -- hopefully we can get him transitioned back to his home medications in next 24 hours and wean off the IV diltiazem  -- pt will be admitted to stepdown ICU as he remains on IV diltiazem  with uncontrolled heart rates  -- pt is on apixaban  5 mg BID for full dose anticoagulation  -- check and monitor electrolytes with repletion as needed   Essential Hypertension  -- he is currently on IV diltiazem  and BP well managed -- try to resume his home BP meds  as we are able to wean him off the IV diltiazem   Acute Cystitis  -- noted on CT scan and abnormal UA -- follow up urine culture testing  -- continue ceftriaxone  2 grams IV daily pending C&S testing  Stage 3b CKD -- monitor daily with labs  Acquired Thrombophilia  -- remains on home apixaban  5 mg BID  GERD -- resumed home pantoprazole    Leukocytosis  -- secondary to acute cystitis  -- follow CBC with differential  Generalized Weakness -- Pt reports he is already feeling better after IV fluid given in ED -- hopefully will continue to improve with treatment of cystitis   Uncontrolled type 2 diabetes mellitus -- continue SSI coverage with frequent CBG monitoring -- added lower dose basal from home dosing semglee  30 units nightly -- added prandial novolog  6 units TID with meals >50% eaten -- monitoring CBGs 5 times daily   DVT prophylaxis: apixaban    Code Status: Full   Family Communication:   Disposition Plan: anticipating home   Consults called:   Admission status: INP  Critical Care Procedure Note Authorized and Performed by: KYM Louder MD  Total Critical Care time:  62 mins Due to a high probability of clinically significant, life threatening deterioration, the patient required my highest level of preparedness to intervene emergently and I personally spent this critical care time directly and personally managing the patient.  This critical care time included obtaining a history; examining the  patient, pulse oximetry; ordering and review of studies; arranging urgent treatment with development of a management plan; evaluation of patient's response of treatment; frequent reassessment; and discussions with other providers.  This critical care time was performed to assess and manage the high probability of imminent and life threatening deterioration that could result in multi-organ failure.  It was exclusive of separately billable procedures and treating other patients and teaching time.    Level of care: Stepdown Afton Louder MD [facility] How to contact the [facility] Attending or Consulting provider 7A - 7P or covering provider during after hours 7P -7A, for this patient?  Check the care team in [facility] and look for a) attending/consulting [facility] provider listed and b) the [facility] team listed Log into [email or web address] and use [place]'s universal password to access. If you do not have the password, please contact the hospital operator. Locate the [facility] provider you are looking for under [facility] and page to a number that you can be directly reached. If you still have difficulty reaching the provider, please page the [facility] (Director on Call) for the Hospitalists listed on amion for assistance.   If 7PM-7AM, please contact night-coverage [email or web address] Password [email or web address]  09/07/2023, 11:02 AM

## 2023-09-07 NOTE — ED Triage Notes (Signed)
 Here by POV from home for continued dysuria, oliguria. Some retention. Denies prostate issues. Mentions some back pain. Denies NVD, swelling, CP, sob, fever, or dizziness. Seen by PCP for the same. Prescribed antibiotic for UTI on Monday. Took Monday and Tuesday. Has not had today. Describes as not getting any better. Rates pain 7/10. Some ncreased wob walking to room, diaphoretic. H/o afib. Alert, NAD, calm, interactive.

## 2023-09-07 NOTE — Plan of Care (Signed)

## 2023-09-07 NOTE — ED Notes (Signed)
 EDP at [facility]

## 2023-09-07 NOTE — ED Provider Notes (Signed)
 [place] EMERGENCY DEPARTMENT AT [facility] Provider Note   CSN: [id number] Arrival date & time: 09/07/23  9292     Patient presents with: Dysuria   Kevin Goodman is a 73 y.o. male.  {Add pertinent medical, surgical, social history, OB history to HPI:32947} Pt with urinary urgency, mild dysuria at tip of penis in past few days. States pcp prescribed antibiotic two days ago, but not feeling any better. No abdominal pain or flank pain. No scrotal or testicular pain or swelling. Denies hx frequent or recurrent utis. No penile discharge. Normal appetite. No nvd. Otherwise does not feel sick or ill.   The history is provided by the patient.  Dysuria Presenting symptoms: dysuria   Associated symptoms: no abdominal pain, no fever, no flank pain, no nausea, no scrotal swelling and no vomiting        Prior to Admission medications   Medication Sig Start Date End Date Taking? Authorizing Provider  acetaminophen  (TYLENOL ) 500 MG tablet Take 1,000 mg by mouth every 6 (six) hours as needed for moderate pain.    [provider]  allopurinol  (ZYLOPRIM ) 100 MG tablet Take 100 mg by mouth daily. 09/17/22   [provider]  ALPRAZolam  (XANAX ) 1 MG tablet Take one tablet by mouth 1 hour prior to MRI 04/13/23   Rogers Hai, MD  apixaban  (ELIQUIS ) 5 MG TABS tablet Take 1 tablet (5 mg total) by mouth 2 (two) times daily. 06/19/21   Johnson, Clanford L, MD  atorvastatin  (LIPITOR) 20 MG tablet Take 20 mg by mouth daily.    [provider]  B-D UF III MINI PEN NEEDLES 31G X 5 MM MISC  01/31/23   [provider]  calcitRIOL (ROCALTROL) 0.25 MCG capsule Take 0.25 mcg by mouth 2 (two) times a week. 03/30/23   [provider]  Colchicine  0.6 MG CAPS Take 1 capsule by mouth 2 (two) times daily. 09/06/22   [provider]  diltiazem  (TIAZAC ) 360 MG 24 hr capsule Take 1 capsule (360 mg total) by mouth daily. 02/12/23   Ricky Fines, MD   FARXIGA  5 MG TABS tablet  03/21/23   [provider]  ferrous sulfate  325 (65 FE) MG EC tablet Take 325 mg by mouth daily. 04/30/22   [provider]  finasteride (PROSCAR) 5 MG tablet Take 5 mg by mouth daily. 04/15/23   [provider]  furosemide  (LASIX ) 40 MG tablet Take 40 mg by mouth 2 (two) times daily.    [provider]  hydrALAZINE  (APRESOLINE ) 100 MG tablet Take 100 mg by mouth 2 (two) times daily.    [provider]  Insulin  Degludec FlexTouch 100 UNIT/ML SOPN SMARTSIG:40 Unit(s) SUB-Q Every Night 09/21/22   [provider]  LANTUS  SOLOSTAR 100 UNIT/ML Solostar Pen Inject 40 Units into the skin at bedtime. 05/18/22   [provider]  losartan  (COZAAR ) 100 MG tablet Take 1 tablet (100 mg total) by mouth daily. 10/27/20   Strader, Laymon HERO, PA-C  metoprolol  tartrate (LOPRESSOR ) 50 MG tablet Take 1 tablet (50 mg total) by mouth 2 (two) times daily. 02/12/23   Ricky Fines, MD  oxymetazoline (AFRIN) 0.05 % nasal spray Place 1 spray into both nostrils 2 (two) times daily as needed for congestion.    [provider]  pantoprazole  (PROTONIX ) 40 MG tablet Take 1 tablet (40 mg total) by mouth 2 (two) times daily before a meal. 30 minutes before breakfast 12/25/20   Cindie Carlin POUR, DO  TRADJENTA 5 MG TABS tablet Take 5 mg by mouth daily.    [provider]    Allergies: Patient has no known allergies.    Review of Systems  Constitutional:  Negative for chills, diaphoresis and fever.  Eyes:  Negative for redness.  Respiratory:  Negative for shortness of breath.   Cardiovascular:  Negative for chest pain.  Gastrointestinal:  Negative for abdominal pain, nausea and vomiting.  Genitourinary:  Positive for dysuria. Negative for flank pain, scrotal swelling and testicular pain.  Musculoskeletal:  Negative for back pain.  Skin:  Negative for rash.  Neurological:  Negative for headaches.    Updated Vital Signs BP  (!) 135/98 (BP Location: Right Arm)   Pulse (!) 107   Temp 97.9 F (36.6 C) (Oral)   Resp 20   Ht 1.854 m (6' 1)   Wt (!) 180.5 kg   SpO2 99%   BMI 52.51 kg/m   Physical Exam Vitals and nursing note reviewed.  Constitutional:      Appearance: Normal appearance. He is well-developed.  HENT:     Head: Atraumatic.     Nose: Nose normal.     Mouth/Throat:     Mouth: Mucous membranes are moist.  Eyes:     General: No scleral icterus.    Conjunctiva/sclera: Conjunctivae normal.  Neck:     Trachea: No tracheal deviation.  Cardiovascular:     Rate and Rhythm: Normal rate and regular rhythm.     Pulses: Normal pulses.     Heart sounds: Normal heart sounds. No murmur heard.    No friction rub. No gallop.  Pulmonary:     Effort: Pulmonary effort is normal. No accessory muscle usage or respiratory distress.     Breath sounds: Normal breath sounds.  Abdominal:     General: Bowel sounds are normal. There is no distension.     Palpations: Abdomen is soft. There is no mass.     Tenderness: There is no abdominal tenderness.  Genitourinary:    Comments: No cva tenderness. Normal external gu exam. No balanitis. No penile discharge. No scrotal or testicular pain, swelling, or tenderness. No skin lesions or skin changes.  Musculoskeletal:        General: No swelling.     Cervical back: Normal range of motion and neck supple. No rigidity.  Skin:    General: Skin is warm and dry.     Findings: No rash.  Neurological:     Mental Status: He is alert.     Comments: Alert, speech clear.   Psychiatric:        Mood and Affect: Mood normal.     (all labs ordered are listed, but only abnormal results are displayed) Results for orders placed or performed during the hospital encounter of 09/07/23  CBC with Differential   Collection Time: 09/07/23  7:45 AM  Result Value Ref Range   WBC 16.7 (H) 4.0 - 10.5 K/uL   RBC 5.11 4.22 - 5.81 MIL/uL   Hemoglobin 15.6 13.0 - 17.0 g/dL   HCT 51.6  60.9 - 47.9 %   MCV 94.5 80.0 - 100.0 fL   MCH 30.5 26.0 - 34.0 pg   MCHC 32.3 30.0 - 36.0 g/dL   RDW 85.6 88.4 - 84.4 %   Platelets 339 150 - 400 K/uL   nRBC 0.0 0.0 - 0.2 %   Neutrophils Relative % 64 %   Neutro Abs 10.9 (H) 1.7 - 7.7 K/uL   Lymphocytes Relative  25 %   Lymphs Abs 4.1 (H) 0.7 - 4.0 K/uL   Monocytes Relative 9 %   Monocytes Absolute 1.5 (H) 0.1 - 1.0 K/uL   Eosinophils Relative 1 %   Eosinophils Absolute 0.1 0.0 - 0.5 K/uL   Basophils Relative 0 %   Basophils Absolute 0.1 0.0 - 0.1 K/uL   Immature Granulocytes 1 %   Abs Immature Granulocytes 0.08 (H) 0.00 - 0.07 K/uL    EKG: None  Radiology: No results found.  {Document cardiac monitor, telemetry assessment procedure when appropriate:32947} Procedures   Medications Ordered in the ED - No data to display    {Click here for ABCD2, HEART and other calculators REFRESH Note before signing:1}                              Medical Decision Making Problems Addressed: Acute UTI: acute illness or injury Atrial fibrillation with rapid ventricular response (HCC): acute illness or injury with systemic symptoms that poses a threat to life or bodily functions Leukocytosis, unspecified type: acute illness or injury Stage 3b chronic kidney disease (HCC): chronic illness or injury that poses a threat to life or bodily functions  Amount and/or Complexity of Data Reviewed External Data Reviewed: notes. Labs: ordered. Decision-making details documented in ED Course. Radiology: ordered and independent interpretation performed. Decision-making details documented in ED Course. ECG/medicine tests: ordered and independent interpretation performed. Decision-making details documented in ED Course. Discussion of management or test interpretation with external provider(s): medicine  Risk Prescription drug management. Decision regarding hospitalization.   Iv ns. Continuous pulse ox and cardiac monitoring. Labs ordered/sent.  Imaging ordered.   Differential diagnosis includes uti, std, afb/rvr. Dispo decision including potential need for admission considered - will get labs and imaging and reassess.   Reviewed nursing notes and prior charts for additional history. External reports reviewed.   Pt initially in nsr. In ED onset, rapid heart beat, continuous pulse ox and monitoring.   Cardiac monitor: afib rate 155.   No change w vagal maneuvers. Hx afib, on eliquis .   Cardizem  bolus/gtt. Ivf.   Labs reviewed/interpreted by me - ua w > 50 wbc, c/w uti, culture sent. Rocephin  iv.   CT reviewed/interpreted by me -   Heart rate improved w meds, still in afib. Hopeful that with ivf, abx/tx of uti, cardizem  iv that will convert back to sinus.   Medicine consulted for admission re afib/rvr, uti/leucocytosis.   CRITICAL CARE RE: atrial fibrillation with rapid ventricular response, uti Performed by: Geet Hosking E Jahyra Sukup Total critical care time: 40 minutes Critical care time was exclusive of separately billable procedures and treating other patients. Critical care was necessary to treat or prevent imminent or life-threatening deterioration. Critical care was time spent personally by me on the following activities: development of treatment plan with patient and/or surrogate as well as nursing, discussions with consultants, evaluation of patient's response to treatment, examination of patient, obtaining history from patient or surrogate, ordering and performing treatments and interventions, ordering and review of laboratory studies, ordering and review of radiographic studies, pulse oximetry and re-evaluation of patient's condition.     {Document critical care time when appropriate  Document review of labs and clinical decision tools ie [id number], etc  Document your independent review of radiology images and any outside records  Document your discussion with family members, caretakers and with consultants  Document  social determinants of health affecting pt's care  Document your decision making  why or why not admission, treatments were needed:32947:::1}   Final diagnoses:  None    ED Discharge Orders     None

## 2023-09-07 NOTE — ED Notes (Signed)
 To CT via stretcher

## 2023-09-07 NOTE — Hospital Course (Signed)
 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  He says that he was prescribed antibiotics by his PCP several days ago and he has been taking the medication for the last couple of days with no significant improvement.  He is having flank pain 7/10.  He reports that he has been increasingly weak and diaphoretic and short of breath.  He did not take his medications this morning.  On arrival to the ED he was noted to be in A-fib with RVR.  He was started on an IV diltiazem  infusion.  He was sent for CT scan stone study with findings concerning for cystitis.  His urinalysis was positive suggesting infection.  A urine culture was sent.  Patient was given IV fluids for hydration and IV antibiotics and admission was requested.

## 2023-09-07 NOTE — ED Notes (Signed)
 Given towelette for CCUA. Encouraged voiding. Bladder scanner showed minimal urine in bladder, 25ml.

## 2023-09-08 DIAGNOSIS — I4891 Unspecified atrial fibrillation: Secondary | ICD-10-CM

## 2023-09-08 DIAGNOSIS — D72829 Elevated white blood cell count, unspecified: Secondary | ICD-10-CM

## 2023-09-08 DIAGNOSIS — R000 Tachycardia, unspecified: Secondary | ICD-10-CM

## 2023-09-08 DIAGNOSIS — Z7901 Long term (current) use of anticoagulants: Secondary | ICD-10-CM

## 2023-09-08 DIAGNOSIS — I10 Essential (primary) hypertension: Secondary | ICD-10-CM

## 2023-09-08 LAB — CBC WITH DIFFERENTIAL/PLATELET
Abs Immature Granulocytes: 0.07 10*3/uL (ref 0.00–0.07)
Basophils Absolute: 0.1 10*3/uL (ref 0.0–0.1)
Basophils Relative: 0 %
Eosinophils Absolute: 0.1 10*3/uL (ref 0.0–0.5)
Eosinophils Relative: 1 %
HCT: 44.8 % (ref 39.0–52.0)
Hemoglobin: 14.4 g/dL (ref 13.0–17.0)
Immature Granulocytes: 1 %
Lymphocytes Relative: 23 %
Lymphs Abs: 3.2 10*3/uL (ref 0.7–4.0)
MCH: 30.3 pg (ref 26.0–34.0)
MCHC: 32.1 g/dL (ref 30.0–36.0)
MCV: 94.3 fL (ref 80.0–100.0)
Monocytes Absolute: 1.3 10*3/uL — ABNORMAL HIGH (ref 0.1–1.0)
Monocytes Relative: 9 %
Neutro Abs: 9.2 10*3/uL — ABNORMAL HIGH (ref 1.7–7.7)
Neutrophils Relative %: 66 %
Platelets: 286 10*3/uL (ref 150–400)
RBC: 4.75 MIL/uL (ref 4.22–5.81)
RDW: 14.3 % (ref 11.5–15.5)
WBC: 13.9 10*3/uL — ABNORMAL HIGH (ref 4.0–10.5)
nRBC: 0 % (ref 0.0–0.2)

## 2023-09-08 LAB — GLUCOSE, CAPILLARY
Glucose-Capillary: 131 mg/dL — ABNORMAL HIGH (ref 70–99)
Glucose-Capillary: 153 mg/dL — ABNORMAL HIGH (ref 70–99)
Glucose-Capillary: 162 mg/dL — ABNORMAL HIGH (ref 70–99)
Glucose-Capillary: 99 mg/dL (ref 70–99)
Glucose-Capillary: 90 mg/dL (ref 70–99)
Glucose-Capillary: 109 mg/dL — ABNORMAL HIGH (ref 70–99)

## 2023-09-08 LAB — BASIC METABOLIC PANEL WITH GFR
Anion gap: 9 (ref 5–15)
BUN: 18 mg/dL (ref 8–23)
CO2: 24 mmol/L (ref 22–32)
Calcium: 9.1 mg/dL (ref 8.9–10.3)
Chloride: 103 mmol/L (ref 98–111)
Creatinine, Ser: 1.37 mg/dL — ABNORMAL HIGH (ref 0.61–1.24)
GFR, Estimated: 55 mL/min — ABNORMAL LOW (ref >60)
Glucose, Bld: 123 mg/dL — ABNORMAL HIGH (ref 70–99)
Potassium: 3.8 mmol/L (ref 3.5–5.1)
Sodium: 136 mmol/L (ref 135–145)

## 2023-09-08 LAB — MAGNESIUM: Magnesium: 2 mg/dL (ref 1.7–2.4)

## 2023-09-08 LAB — URINE CULTURE: Culture: <10000 — AB

## 2023-09-08 MED ORDER — INSULIN GLARGINE-YFGN 100 UNIT/ML ~~LOC~~ SOLN
25.0000 [IU] | Freq: Every day | SUBCUTANEOUS | Status: DC
  Administered 2023-09-08 – 2023-09-09 (×2): 25 [IU] via SUBCUTANEOUS
  Filled 2023-09-08 (×3): qty 0.25

## 2023-09-08 NOTE — Care Management Important Message (Signed)
 Important Message  Patient Details  Name: Kevin Goodman MRN: [id number] Date of Birth: [birth year]   Important Message Given:  Yes - Medicare IM     Jacai Kipp L Jalyn Dutta 09/08/2023, 11:00 AM

## 2023-09-08 NOTE — Plan of Care (Signed)

## 2023-09-08 NOTE — Progress Notes (Signed)
 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  He says that he was prescribed antibiotics by his PCP several days ago and he has been taking the medication for the last couple of days with no significant improvement.  He is having flank pain 7/10.  He reports that he has been increasingly weak and diaphoretic and short of breath.  He did not take his medications this morning.  On arrival to the ED he was noted to be in A-fib with RVR.  He was started on an IV diltiazem  infusion.  He was sent for CT scan stone study with findings concerning for cystitis.  His urinalysis was positive suggesting infection.  A urine culture was sent.  Patient was given IV fluids for hydration and IV antibiotics and admission was requested.   Assessment and Plan:  Atrial fibrillation with RVR -- pt did not take morning meds: diltiazem  CD 360 mg and metoprolol   -- he remains on IV diltiazem  infusion but we are hopeful we can wean it off today -- hopefully we can get him transitioned back to his home medications in next 24 hours and wean off the IV diltiazem  -- pt restarted on his home rate controlling meds; metoprolol  50 mg BID, diltiazem  CD 360 mg  -- pt is on apixaban  5 mg BID for full dose anticoagulation  -- check and monitor electrolytes with repletion as needed    Essential Hypertension  -- he is currently on IV diltiazem  and BP well managed -- resume his home BP meds     Acute Cystitis  -- noted on CT scan and abnormal UA -- follow up urine culture testing  -- continue ceftriaxone  2 grams IV daily pending C&S testing   Stage 3b CKD -- monitor daily with labs   Acquired Thrombophilia  -- remains on home apixaban  5 mg BID   GERD -- resumed home pantoprazole     Leukocytosis  -- secondary to acute cystitis  -- follow CBC with differential   Generalized Weakness -- Pt reports he is already feeling better after IV fluid given in ED -- hopefully will continue to improve with treatment of cystitis    Uncontrolled type 2 diabetes mellitus -- continue SSI coverage with frequent CBG monitoring -- added lower dose basal from home dosing to semglee  25 units nightly -- added prandial novolog  6 units TID with meals >50% eaten -- monitoring CBGs 5 times daily  CBG (last 3)  Recent Labs    09/08/23 0731 09/08/23 0737 09/08/23 1124  GLUCAP 162* 153* 99     DVT prophylaxis: apixaban  Code Status: Full  Family Communication:  Disposition: anticipate home in 1-2 days   Consultants:   Procedures:   Antimicrobials:  Ceftriaxone     Subjective: Pt having palpitations, having painful burning with urination, denies being sexually active.    Objective: Vitals:   09/08/23 1032 09/08/23 1100 09/08/23 1123 09/08/23 1141  BP: 130/81 (!) 124/97  (!) 124/97  Pulse:  81    Resp:  13  Temp:   98.1 F (36.7 C)   TempSrc:   Oral   SpO2: 97% 96%    Weight:      Height:        Intake/Output Summary (Last 24 hours) at 09/08/2023 1233 Last data filed at 09/08/2023 1142 Gross per 24 hour  Intake 1815.85 ml  Output 1900 ml  Net -84.15 ml   Filed Weights   09/07/23 0730 09/08/23 0500  Weight: (!) 180.5 kg (!) 180.5 kg   Examination:  General exam: Appears calm and comfortable  Respiratory system: Clear to auscultation. Respiratory effort normal. Cardiovascular system: irregularly irregular. No JVD, murmurs, rubs, gallops or clicks. No pedal  edema. Gastrointestinal system: Abdomen is nondistended, soft and nontender. No organomegaly or masses felt. Normal bowel sounds heard. Central nervous system: Alert and oriented. No focal neurological deficits. Extremities: Symmetric 5 x 5 power. Skin: No rashes, lesions or ulcers. Psychiatry: Judgement and insight appear normal. Mood & affect appropriate.   Data Reviewed: I have personally reviewed following labs and imaging studies  CBC: Recent Labs  Lab 09/07/23 0745 09/08/23 0430  WBC 16.7* 13.9*  NEUTROABS 10.9* 9.2*  HGB 15.6 14.4  HCT 48.3 44.8  MCV 94.5 94.3  PLT 339 286    Basic Metabolic Panel: Recent Labs  Lab 09/07/23 0745 09/08/23 0430  NA 135 136  K 3.8 3.8  CL 100 103  CO2 22 24  GLUCOSE 155* 123*  BUN 21 18  CREATININE 1.82* 1.37*  CALCIUM  9.4 9.1  MG  --  2.0    CBG: Recent Labs  Lab 09/07/23 2129 09/08/23 0300 09/08/23 0731 09/08/23 0737 09/08/23 1124  GLUCAP 105* 131* 162* 153* 99    Recent Results (from the past 240 hours)  MRSA Next Gen by PCR, Nasal     Status: None   Collection Time: 09/07/23 11:30 AM   Specimen: Nasal Mucosa; Nasal Swab  Result Value Ref Range Status   MRSA by PCR Next Gen NOT DETECTED NOT DETECTED Final    Comment: (NOTE) The GeneXpert MRSA Assay (FDA approved for NASAL specimens only), is one component of a comprehensive MRSA colonization surveillance program. It is not intended to diagnose MRSA infection nor to guide or monitor treatment for MRSA infections. Test performance is not FDA approved in patients less than 28 years old. Performed at [facility], [address], [place], [place] [address]      Radiology Studies: CT Renal Stone Study Result Date: 09/07/2023 CLINICAL DATA:  Abdominal/flank pain, stone suspected uti, urgency, r/o ureteral stone or other process EXAM: CT ABDOMEN AND PELVIS WITHOUT CONTRAST TECHNIQUE: Multidetector CT imaging of the abdomen and pelvis was performed following the  standard protocol without IV contrast. RADIATION DOSE REDUCTION: This exam was performed according to the departmental dose-optimization program which includes automated exposure control, adjustment of the mA and/or kV according to patient size and/or use of iterative reconstruction technique. COMPARISON:  CT pelvis dated 04/19/2016. FINDINGS: Lower chest: No acute abnormality. Hepatobiliary: No focal liver abnormality is seen. No gallstones, gallbladder wall thickening, or biliary dilatation. Pancreas: Calcifications in the pancreatic head may reflect sequela of chronic pancreatitis. Ductal dilation or surrounding inflammatory changes. Spleen: Normal in size without focal abnormality. Adrenals/Urinary Tract: The adrenal glands are unremarkable. No urolithiasis or hydronephrosis. Bladder is partially distended with circumferential bladder wall thickening. Stomach/Bowel: Stomach is within normal limits. Appendix appears normal. No evidence of bowel wall thickening, distention, or inflammatory changes. Vascular/Lymphatic: Abdominal aorta is normal in caliber with atherosclerotic  calcification. Similar enlarged left iliac node measures 14 mm in short axis (2:63), not significantly changed since prior exam dated 04/19/2016. Reproductive: Prostate is unremarkable. Other: No abdominal wall hernia. No abdominopelvic ascites. No intraperitoneal free air. Musculoskeletal: No acute osseous abnormality. No suspicious osseous lesion. Degenerative disc changes at L5-S1. IMPRESSION: 1. No evidence of urolithiasis or hydronephrosis. 2. Circumferential bladder wall thickening is suspicious for cystitis. Recommend correlation with urinalysis. 3. Enlarged left iliac node is nonspecific and may be reactive, similar to the prior exam prior. 4.  Aortic Atherosclerosis (ICD10-I70.0). Electronically Signed   By: Harrietta Sherry M.D.   On: 09/07/2023 10:08    Scheduled Meds:  allopurinol   200 mg Oral Daily   apixaban   5 mg Oral BID    atorvastatin   20 mg Oral QPM   Chlorhexidine  Gluconate Cloth  6 each Topical Daily   diltiazem  (CARDIZEM  CD) 24 hr capsule 360 mg  360 mg Oral Daily   furosemide   40 mg Oral BID   hydrALAZINE   100 mg Oral BID   insulin  aspart  0-20 Units Subcutaneous TID WC   insulin  aspart  0-5 Units Subcutaneous QHS   insulin  aspart  6 Units Subcutaneous TID WC   insulin  glargine-yfgn  25 Units Subcutaneous QHS   losartan   100 mg Oral Daily   metoprolol  tartrate  50 mg Oral BID   pantoprazole   40 mg Oral BID AC   Continuous Infusions:  cefTRIAXone  (ROCEPHIN )  IV Stopped (09/08/23 0846)   diltiazem  (CARDIZEM ) infusion 7.5 mg/hr (09/08/23 1142)     LOS: 1 day   Critical Care Procedure Note Authorized and Performed by: KYM Louder MD  Total Critical Care time:  51 mins Due to a high probability of clinically significant, life threatening deterioration, the patient required my highest level of preparedness to intervene emergently and I personally spent this critical care time directly and personally managing the patient.  This critical care time included obtaining a history; examining the patient, pulse oximetry; ordering and review of studies; arranging urgent treatment with development of a management plan; evaluation of patient's response of treatment; frequent reassessment; and discussions with other providers.  This critical care time was performed to assess and manage the high probability of imminent and life threatening deterioration that could result in multi-organ failure.  It was exclusive of separately billable procedures and treating other patients and teaching time.    Afton Louder, MD How to contact the [facility] Attending or Consulting provider 7A - 7P or covering provider during after hours 7P -7A, for this patient?  Check the care team in [facility] and look for a) attending/consulting [facility] provider listed and b) the [facility] team listed Log into [email or web address] to find provider on call.  Locate the [facility]  provider you are looking for under [facility] and page to a number that you can be directly reached. If you still have difficulty reaching the provider, please page the [facility] (Director on Call) for the Hospitalists listed on amion for assistance.  09/08/2023, 12:33 PM

## 2023-09-08 NOTE — Progress Notes (Signed)
   09/08/23 1214  TOC Brief Assessment  Insurance and Status Reviewed  Patient has primary care physician Yes  Home environment has been reviewed From home  Prior level of function: Min assist  Prior/Current Home Services No current home services  Social Drivers of Health Review SDOH reviewed interventions complete (smoking cessation added to AVS)  Readmission risk has been reviewed Yes  Transition of care needs no transition of care needs at this time   Transition of Care Department [facility]) has reviewed patient and no TOC needs have been identified at this time. We will continue to monitor patient advancement through interdisciplinary progression rounds. If new patient transition needs arise, please place a TOC consult.

## 2023-09-09 DIAGNOSIS — N3000 Acute cystitis without hematuria: Secondary | ICD-10-CM | POA: Diagnosis not present

## 2023-09-09 DIAGNOSIS — E1121 Type 2 diabetes mellitus with diabetic nephropathy: Secondary | ICD-10-CM | POA: Diagnosis not present

## 2023-09-09 DIAGNOSIS — R531 Weakness: Secondary | ICD-10-CM | POA: Diagnosis not present

## 2023-09-09 DIAGNOSIS — I4891 Unspecified atrial fibrillation: Secondary | ICD-10-CM | POA: Diagnosis not present

## 2023-09-09 LAB — CBC WITH DIFFERENTIAL/PLATELET
Abs Immature Granulocytes: 0.09 K/uL — ABNORMAL HIGH (ref 0.00–0.07)
Basophils Absolute: 0.1 K/uL (ref 0.0–0.1)
Basophils Relative: 0 %
Eosinophils Absolute: 0.2 K/uL (ref 0.0–0.5)
Eosinophils Relative: 1 %
HCT: 45.9 % (ref 39.0–52.0)
Hemoglobin: 15.2 g/dL (ref 13.0–17.0)
Immature Granulocytes: 1 %
Lymphocytes Relative: 24 %
Lymphs Abs: 3.5 K/uL (ref 0.7–4.0)
MCH: 31.8 pg (ref 26.0–34.0)
MCHC: 33.1 g/dL (ref 30.0–36.0)
MCV: 96 fL (ref 80.0–100.0)
Monocytes Absolute: 1.2 K/uL — ABNORMAL HIGH (ref 0.1–1.0)
Monocytes Relative: 8 %
Neutro Abs: 9.6 K/uL — ABNORMAL HIGH (ref 1.7–7.7)
Neutrophils Relative %: 66 %
Platelets: 289 K/uL (ref 150–400)
RBC: 4.78 MIL/uL (ref 4.22–5.81)
RDW: 14.3 % (ref 11.5–15.5)
WBC: 14.7 K/uL — ABNORMAL HIGH (ref 4.0–10.5)
nRBC: 0 % (ref 0.0–0.2)

## 2023-09-09 LAB — BASIC METABOLIC PANEL WITH GFR
Anion gap: 9 (ref 5–15)
BUN: 18 mg/dL (ref 8–23)
CO2: 23 mmol/L (ref 22–32)
Calcium: 8.9 mg/dL (ref 8.9–10.3)
Chloride: 104 mmol/L (ref 98–111)
Creatinine, Ser: 1.45 mg/dL — ABNORMAL HIGH (ref 0.61–1.24)
GFR, Estimated: 52 mL/min — ABNORMAL LOW (ref >60)
Glucose, Bld: 101 mg/dL — ABNORMAL HIGH (ref 70–99)
Potassium: 4.4 mmol/L (ref 3.5–5.1)
Sodium: 136 mmol/L (ref 135–145)

## 2023-09-09 LAB — GLUCOSE, CAPILLARY
Glucose-Capillary: 107 mg/dL — ABNORMAL HIGH (ref 70–99)
Glucose-Capillary: 107 mg/dL — ABNORMAL HIGH (ref 70–99)
Glucose-Capillary: 135 mg/dL — ABNORMAL HIGH (ref 70–99)
Glucose-Capillary: 125 mg/dL — ABNORMAL HIGH (ref 70–99)
Glucose-Capillary: 67 mg/dL — ABNORMAL LOW (ref 70–99)
Glucose-Capillary: 157 mg/dL — ABNORMAL HIGH (ref 70–99)

## 2023-09-09 LAB — MAGNESIUM: Magnesium: 2.1 mg/dL (ref 1.7–2.4)

## 2023-09-09 NOTE — Progress Notes (Signed)
 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  He says that he was prescribed antibiotics by his PCP several days ago and he has been taking the medication for the last couple of days with no significant improvement.  He is having flank pain 7/10.  He reports that he has been increasingly weak and diaphoretic and short of breath.  He did not take his medications this morning.  On arrival to the ED he was noted to be in A-fib with RVR.  He was started on an IV diltiazem  infusion.  He was sent for CT scan stone study with findings concerning for cystitis.  His urinalysis was positive suggesting infection.  A urine culture was sent.  Patient was given IV fluids for hydration and IV antibiotics and admission was requested.   Assessment and Plan:  Atrial fibrillation with RVR - IMPROVED -- pt restarted on home oral rate control medications: diltiazem  CD 360 mg and metoprolol  50 mg BID -- pt is on apixaban  5 mg BID for full dose anticoagulation  -- check and monitor electrolytes with repletion as needed    Essential Hypertension  -- resumed his home BP meds, held losartan  today due to soft BPs overnight   Acute Cystitis and dysuria  -- noted on CT scan with abnormal UA -- continue ceftriaxone  2 grams IV daily  -- holding home farxiga     Stage 3b CKD -- monitor daily with labs   Acquired Thrombophilia  -- remains on home apixaban  5 mg BID   GERD --  resumed home pantoprazole     Leukocytosis  -- secondary to acute cystitis  -- follow CBC with differential   Generalized Weakness -- Pt reports feeling better after IV fluid  -- hopefully will continue to improve with treatment of cystitis    Uncontrolled type 2 diabetes mellitus -- continue SSI coverage with frequent CBG monitoring -- added lower dose basal from home dosing to semglee  25 units nightly -- added prandial novolog  6 units TID with meals >50% eaten -- monitoring CBGs 5 times daily  CBG (last 3)  Recent Labs    09/08/23 2115 09/09/23 0306 09/09/23 0744  GLUCAP 109* 107* 107*    DVT prophylaxis: apixaban  Code Status: Full  Family Communication:  Disposition: anticipate home tomorrow if clinically stable    Consultants:   Procedures:   Antimicrobials:  Ceftriaxone     Subjective: Says he is having less burning and pain with urination today.  Sitting up in chair.     Objective: Vitals:   09/09/23 0017 09/09/23 0400 09/09/23 0750 09/09/23 0847  BP:  128/68    Pulse: (!) 102 78  (!) 105  Resp: 11 (!) 23    Temp: 98 F (36.7 C) 98.1 F (36.7 C) 98 F (36.7 C)   TempSrc: Axillary Oral Oral   SpO2: 92% 98%    Weight:      Height:        Intake/Output Summary (Last 24 hours) at 09/09/2023 9049 Last data filed at  09/09/2023 0750 Gross per 24 hour  Intake 373.02 ml  Output 850 ml  Net -476.98 ml   Filed Weights   09/07/23 0730 09/08/23 0500  Weight: (!) 180.5 kg (!) 180.5 kg   Examination:  General exam: Appears calm and comfortable  Respiratory system: Clear to auscultation. Respiratory effort normal. Cardiovascular system: irregularly irregular. No JVD, murmurs, rubs, gallops or clicks. No pedal edema. Gastrointestinal system: Abdomen is nondistended, soft and nontender. No organomegaly or masses felt. Normal bowel sounds heard. Central nervous system: Alert and oriented. No focal neurological deficits. Extremities: Symmetric 5 x 5 power. Skin:  No rashes, lesions or ulcers. Psychiatry: Judgement and insight appear normal. Mood & affect appropriate.   Data Reviewed: I have personally reviewed following labs and imaging studies  CBC: Recent Labs  Lab 09/07/23 0745 09/08/23 0430 09/09/23 0435  WBC 16.7* 13.9* 14.7*  NEUTROABS 10.9* 9.2* 9.6*  HGB 15.6 14.4 15.2  HCT 48.3 44.8 45.9  MCV 94.5 94.3 96.0  PLT 339 286 289    Basic Metabolic Panel: Recent Labs  Lab 09/07/23 0745 09/08/23 0430 09/09/23 0435  NA 135 136 136  K 3.8 3.8 4.4  CL 100 103 104  CO2 22 24 23   GLUCOSE 155* 123* 101*  BUN 21 18 18   CREATININE 1.82* 1.37* 1.45*  CALCIUM  9.4 9.1 8.9  MG  --  2.0 2.1    CBG: Recent Labs  Lab 09/08/23 1124 09/08/23 1629 09/08/23 2115 09/09/23 0306 09/09/23 0744  GLUCAP 99 90 109* 107* 107*    Recent Results (from the past 240 hours)  Urine Culture     Status: Abnormal   Collection Time: 09/07/23  8:11 AM   Specimen: Urine, Clean Catch  Result Value Ref Range Status   Specimen Description   Final    URINE, CLEAN CATCH Performed at [facility], [address] [place], [place] [address]    Special Requests   Final    NONE Performed at [facility], [address] [place], [place] [address]    Culture (A)  Final    <10,000 COLONIES/mL INSIGNIFICANT GROWTH Performed at [facility], [address], [place], [place] [address]    Report Status 09/08/2023 FINAL  Final  MRSA Next Gen by PCR, Nasal     Status: None   Collection Time: 09/07/23 11:30 AM   Specimen: Nasal Mucosa; Nasal Swab  Result Value Ref Range Status   MRSA by PCR Next Gen NOT DETECTED NOT DETECTED Final    Comment: (NOTE) The GeneXpert MRSA Assay (FDA approved for NASAL specimens only), is one component of a comprehensive MRSA colonization surveillance program. It is not intended to diagnose MRSA infection nor to guide or monitor treatment for MRSA infections. Test performance is not FDA approved in patients less than 52  years old. Performed at [facility], [address], [place], [place] [address]      Radiology Studies: CT Renal Stone Study Result Date: 09/07/2023 CLINICAL DATA:  Abdominal/flank pain, stone suspected uti, urgency, r/o ureteral stone or other process EXAM: CT ABDOMEN AND PELVIS WITHOUT CONTRAST TECHNIQUE: Multidetector CT imaging of the abdomen and pelvis was performed following the standard protocol without IV contrast. RADIATION DOSE REDUCTION: This exam was performed according to the departmental dose-optimization program which includes automated exposure control, adjustment of the mA and/or kV according to patient size and/or use of iterative reconstruction technique. COMPARISON:  CT pelvis dated 04/19/2016. FINDINGS: Lower chest: No acute abnormality. Hepatobiliary: No focal liver  abnormality is seen. No gallstones, gallbladder wall thickening, or biliary dilatation. Pancreas: Calcifications in the pancreatic head may reflect sequela of chronic pancreatitis. Ductal dilation or surrounding inflammatory changes. Spleen: Normal in size without focal abnormality. Adrenals/Urinary Tract: The adrenal glands are unremarkable. No urolithiasis or hydronephrosis. Bladder is partially distended with circumferential bladder wall thickening. Stomach/Bowel: Stomach is within normal limits. Appendix appears normal. No evidence of bowel wall thickening, distention, or inflammatory changes. Vascular/Lymphatic: Abdominal aorta is normal in caliber with atherosclerotic calcification. Similar enlarged left iliac node measures 14 mm in short axis (2:63), not significantly changed since prior exam dated 04/19/2016. Reproductive: Prostate is unremarkable. Other: No abdominal wall hernia. No abdominopelvic ascites. No intraperitoneal free air. Musculoskeletal: No acute osseous abnormality. No suspicious osseous lesion. Degenerative disc changes at L5-S1. IMPRESSION: 1. No evidence of urolithiasis or hydronephrosis. 2.  Circumferential bladder wall thickening is suspicious for cystitis. Recommend correlation with urinalysis. 3. Enlarged left iliac node is nonspecific and may be reactive, similar to the prior exam prior. 4.  Aortic Atherosclerosis (ICD10-I70.0). Electronically Signed   By: Harrietta Sherry M.D.   On: 09/07/2023 10:08    Scheduled Meds:  allopurinol   200 mg Oral Daily   apixaban   5 mg Oral BID   atorvastatin   20 mg Oral QPM   Chlorhexidine  Gluconate Cloth  6 each Topical Daily   diltiazem  (CARDIZEM  CD) 24 hr capsule 360 mg  360 mg Oral Daily   furosemide   40 mg Oral BID   hydrALAZINE   100 mg Oral BID   insulin  aspart  0-20 Units Subcutaneous TID WC   insulin  aspart  0-5 Units Subcutaneous QHS   insulin  aspart  6 Units Subcutaneous TID WC   insulin  glargine-yfgn  25 Units Subcutaneous QHS   metoprolol  tartrate  50 mg Oral BID   pantoprazole   40 mg Oral BID AC   Continuous Infusions:  cefTRIAXone  (ROCEPHIN )  IV 2 g (09/09/23 0854)     LOS: 2 days   Time spent: 52 mins   Jaylea Plourde Vicci, MD How to contact the [facility] Attending or Consulting provider 7A - 7P or covering provider during after hours 7P -7A, for this patient?  Check the care team in [facility] and look for a) attending/consulting [facility] provider listed and b) the [facility] team listed Log into [email or web address] to find provider on call.  Locate the [facility] provider you are looking for under [facility] and page to a number that you can be directly reached. If you still have difficulty reaching the provider, please page the [facility] (Director on Call) for the Hospitalists listed on amion for assistance.  09/09/2023, 9:50 AM

## 2023-09-09 NOTE — Plan of Care (Signed)
  Problem: Education: Goal: Knowledge of General Education information will improve Description: Including pain rating scale, medication(s)/side effects and non-pharmacologic comfort measures Outcome: Progressing   Problem: Health Behavior/Discharge Planning: Goal: Ability to manage health-related needs will improve Outcome: Progressing   Problem: Clinical Measurements: Goal: Ability to maintain clinical measurements within normal limits will improve Outcome: Progressing Goal: Will remain free from infection Outcome: Progressing Goal: Diagnostic test results will improve Outcome: Progressing Goal: Respiratory complications will improve Outcome: Progressing Goal: Cardiovascular complication will be avoided Outcome: Progressing   Problem: Activity: Goal: Risk for activity intolerance will decrease Outcome: Progressing   Problem: Nutrition: Goal: Adequate nutrition will be maintained Outcome: Progressing   Problem: Coping: Goal: Level of anxiety will decrease Outcome: Progressing   Problem: Elimination: Goal: Will not experience complications related to bowel motility Outcome: Progressing Goal: Will not experience complications related to urinary retention Outcome: Progressing   Problem: Safety: Goal: Ability to remain free from injury will improve Outcome: Progressing   Problem: Education: Goal: Ability to describe self-care measures that may prevent or decrease complications (Diabetes Survival Skills Education) will improve Outcome: Progressing Goal: Individualized Educational Video(s) Outcome: Progressing   Problem: Coping: Goal: Ability to adjust to condition or change in health will improve Outcome: Progressing   Problem: Fluid Volume: Goal: Ability to maintain a balanced intake and output will improve Outcome: Progressing   Problem: Health Behavior/Discharge Planning: Goal: Ability to identify and utilize available resources and services will  improve Outcome: Progressing Goal: Ability to manage health-related needs will improve Outcome: Progressing   Problem: Metabolic: Goal: Ability to maintain appropriate glucose levels will improve Outcome: Progressing   Problem: Nutritional: Goal: Maintenance of adequate nutrition will improve Outcome: Progressing Goal: Progress toward achieving an optimal weight will improve Outcome: Progressing   Problem: Skin Integrity: Goal: Risk for impaired skin integrity will decrease Outcome: Progressing   Problem: Tissue Perfusion: Goal: Adequacy of tissue perfusion will improve Outcome: Progressing

## 2023-09-10 DIAGNOSIS — E1121 Type 2 diabetes mellitus with diabetic nephropathy: Secondary | ICD-10-CM | POA: Diagnosis not present

## 2023-09-10 DIAGNOSIS — N3000 Acute cystitis without hematuria: Secondary | ICD-10-CM | POA: Diagnosis not present

## 2023-09-10 DIAGNOSIS — N1832 Chronic kidney disease, stage 3b: Secondary | ICD-10-CM

## 2023-09-10 DIAGNOSIS — I4891 Unspecified atrial fibrillation: Secondary | ICD-10-CM | POA: Diagnosis not present

## 2023-09-10 LAB — GLUCOSE, CAPILLARY
Glucose-Capillary: 122 mg/dL — ABNORMAL HIGH (ref 70–99)
Glucose-Capillary: 125 mg/dL — ABNORMAL HIGH (ref 70–99)
Glucose-Capillary: 157 mg/dL — ABNORMAL HIGH (ref 70–99)

## 2023-09-10 MED ORDER — SACCHAROMYCES BOULARDII 250 MG PO CAPS
250.0000 mg | ORAL_CAPSULE | Freq: Two times a day (BID) | ORAL | Status: DC
  Administered 2023-09-10: 250 mg via ORAL
  Filled 2023-09-10: qty 1

## 2023-09-10 MED ORDER — INSULIN ASPART 100 UNIT/ML IJ SOLN
3.0000 [IU] | Freq: Three times a day (TID) | INTRAMUSCULAR | Status: DC
  Administered 2023-09-10: 3 [IU] via SUBCUTANEOUS

## 2023-09-10 MED ORDER — CEPHALEXIN 500 MG PO CAPS: 500.0000 mg | ORAL_CAPSULE | Freq: Three times a day (TID) | ORAL | 0 refills | Status: AC

## 2023-09-10 MED ORDER — SACCHAROMYCES BOULARDII 250 MG PO CAPS: 250.0000 mg | ORAL_CAPSULE | Freq: Two times a day (BID) | ORAL | 0 refills | Status: AC

## 2023-09-10 NOTE — Discharge Summary (Signed)
 Physician Discharge Summary  Kevin Goodman FMW:[id number] DOB: Sep 18, 1950 DOA: 09/07/2023  PCP: Fanta, Tesfaye Demissie, MD  Admit date: 09/07/2023 Discharge date: 09/10/2023  Admitted From:  HOME  Disposition:  HOME   Recommendations for Outpatient Follow-up:  Follow up with PCP in 1 weeks Please obtain BMP/CBC in 1 week Farxiga  was placed on hold due to recurrent UTIs  Home Health: NA   Discharge Condition: STABLE   CODE STATUS: FULL DIET: heart healthy/carb modified    Brief Hospitalization Summary: Please see all hospital notes, images, labs for full details of the hospitalization. 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  He says that he was prescribed antibiotics by his PCP several days ago and he has been taking the medication for the last couple of days with no significant improvement.  He is having flank pain 7/10.  He reports that he has been increasingly weak and diaphoretic and short of breath.  He did not take his medications this morning.  On arrival to the ED he was noted to be in A-fib with RVR.  He was started on an IV diltiazem  infusion.  He was sent for CT scan stone study with findings concerning for cystitis.  His urinalysis was positive suggesting infection.  A urine culture was sent.  Patient was given IV fluids for hydration and IV antibiotics and admission was requested.  HOSPITAL COURSE BY PROBLEM LIST  Atrial fibrillation with RVR - CONTROLLED  -- pt restarted on home oral rate control medications: diltiazem  CD 360 mg and metoprolol  50 mg BID -- pt is on apixaban  5 mg BID for full dose anticoagulation  -- Heart  rates have been controlled.     Essential Hypertension  -- resumed his home BP meds   Acute Cystitis and dysuria  -- noted on CT scan with abnormal UA -- treated with ceftriaxone  2 grams IV daily in hospital  -- holding home farxiga  due to recurrent UTIs -- 4 more days of oral cephalexin  at discharge  Stage 3b CKD -- monitor daily with labs   Acquired Thrombophilia  -- remains on home apixaban  5 mg BID   GERD -- resumed home pantoprazole     Leukocytosis  -- secondary to acute cystitis  -- follow CBC with differential   Generalized Weakness -- Pt reports feeling better after IV fluid  -- hopefully will continue to improve with treatment of cystitis    Uncontrolled type 2 diabetes mellitus -- continue SSI coverage with frequent CBG monitoring -- added lower dose basal from home dosing to semglee  25 units nightly -- added prandial novolog  6 units TID with meals >50% eaten -- monitoring CBGs 5 times daily -- resume home diabetes treatment at discharge except hold farxiga , pt verbalized understanding     Discharge Diagnoses:  Principal Problem:   Atrial fibrillation with RVR (HCC) Active Problems:   Acute cystitis   Morbid obesity with body mass index (BMI) greater than or equal to 50 (HCC)   Hypertension   Long term (current) use of anticoagulants   Dysphagia   Gastroesophageal reflux disease   A-fib (HCC)   Tachycardia   Type 2 diabetes with nephropathy (HCC)   Essential hypertension   History of CVA (  cerebrovascular accident)   Stage 3b chronic kidney disease (CKD) (HCC)   Generalized weakness   Leukocytosis   Discharge Instructions:  Allergies as of 09/10/2023   No Known Allergies      Medication List     STOP taking these medications    Colchicine  0.6 MG Caps   Farxiga  5 MG Tabs tablet Generic drug: dapagliflozin  propanediol   finasteride 5 MG tablet Commonly known as: PROSCAR   Insulin  Degludec FlexTouch 100 UNIT/ML Sopn       TAKE these  medications    acetaminophen  500 MG tablet Commonly known as: TYLENOL  Take 1,000 mg by mouth every 6 (six) hours as needed for moderate pain.   Allopurinol  200 MG Tabs Take 1 tablet by mouth daily.   ALPRAZolam  1 MG tablet Commonly known as: Xanax  Take one tablet by mouth 1 hour prior to MRI   apixaban  5 MG Tabs tablet Commonly known as: ELIQUIS  Take 1 tablet (5 mg total) by mouth 2 (two) times daily.   atorvastatin  20 MG tablet Commonly known as: LIPITOR Take 20 mg by mouth daily.   B-D UF III MINI PEN NEEDLES 31G X 5 MM Misc Generic drug: Insulin  Pen Needle   calcitRIOL 0.25 MCG capsule Commonly known as: ROCALTROL Take 0.25 mcg by mouth 2 (two) times a week.   cephALEXin  500 MG capsule Commonly known as: KEFLEX  Take 1 capsule (500 mg total) by mouth 3 (three) times daily for 4 days. Start taking on: September 11, 2023 What changed: when to take this   diltiazem  360 MG 24 hr capsule Commonly known as: TIAZAC  Take 1 capsule (360 mg total) by mouth daily.   ferrous sulfate  325 (65 FE) MG EC tablet Take 325 mg by mouth daily.   furosemide  40 MG tablet Commonly known as: LASIX  Take 40 mg by mouth 2 (two) times daily.   hydrALAZINE  100 MG tablet Commonly known as: APRESOLINE  Take 100 mg by mouth 2 (two) times daily.   Lantus  SoloStar 100 UNIT/ML Solostar Pen Generic drug: insulin  glargine Inject 40 Units into the skin at bedtime.   losartan  100 MG tablet Commonly known as: COZAAR  Take 1 tablet (100 mg total) by mouth daily.   metoprolol  tartrate 50 MG tablet Commonly known as: LOPRESSOR  Take 1 tablet (50 mg total) by mouth 2 (two) times daily.   pantoprazole  40 MG tablet Commonly known as: PROTONIX  Take 1 tablet (40 mg total) by mouth 2 (two) times daily before a meal. 30 minutes before breakfast   saccharomyces boulardii 250 MG capsule Commonly known as: FLORASTOR Take 1 capsule (250 mg total) by mouth 2 (two) times daily for 14 days.        Follow-up  Information     Fanta, Benita Area, MD. Schedule an appointment as soon as possible for a visit in 1 week(s).   Specialty: Internal Medicine Why: Hospital Follow Up Contact information: [address] [place] [address] [phone]                No Known Allergies Allergies as of 09/10/2023   No Known Allergies      Medication List     STOP taking these medications    Colchicine  0.6 MG Caps   Farxiga  5 MG Tabs tablet Generic drug: dapagliflozin  propanediol   finasteride 5 MG tablet Commonly known as: PROSCAR   Insulin  Degludec FlexTouch 100 UNIT/ML Sopn       TAKE these medications    acetaminophen  500 MG tablet  Commonly known as: TYLENOL  Take 1,000 mg by mouth every 6 (six) hours as needed for moderate pain.   Allopurinol  200 MG Tabs Take 1 tablet by mouth daily.   ALPRAZolam  1 MG tablet Commonly known as: Xanax  Take one tablet by mouth 1 hour prior to MRI   apixaban  5 MG Tabs tablet Commonly known as: ELIQUIS  Take 1 tablet (5 mg total) by mouth 2 (two) times daily.   atorvastatin  20 MG tablet Commonly known as: LIPITOR Take 20 mg by mouth daily.   B-D UF III MINI PEN NEEDLES 31G X 5 MM Misc Generic drug: Insulin  Pen Needle   calcitRIOL 0.25 MCG capsule Commonly known as: ROCALTROL Take 0.25 mcg by mouth 2 (two) times a week.   cephALEXin  500 MG capsule Commonly known as: KEFLEX  Take 1 capsule (500 mg total) by mouth 3 (three) times daily for 4 days. Start taking on: September 11, 2023 What changed: when to take this   diltiazem  360 MG 24 hr capsule Commonly known as: TIAZAC  Take 1 capsule (360 mg total) by mouth daily.   ferrous sulfate  325 (65 FE) MG EC tablet Take 325 mg by mouth daily.   furosemide  40 MG tablet Commonly known as: LASIX  Take 40 mg by mouth 2 (two) times daily.   hydrALAZINE  100 MG tablet Commonly known as: APRESOLINE  Take 100 mg by mouth 2 (two) times daily.   Lantus  SoloStar 100 UNIT/ML  Solostar Pen Generic drug: insulin  glargine Inject 40 Units into the skin at bedtime.   losartan  100 MG tablet Commonly known as: COZAAR  Take 1 tablet (100 mg total) by mouth daily.   metoprolol  tartrate 50 MG tablet Commonly known as: LOPRESSOR  Take 1 tablet (50 mg total) by mouth 2 (two) times daily.   pantoprazole  40 MG tablet Commonly known as: PROTONIX  Take 1 tablet (40 mg total) by mouth 2 (two) times daily before a meal. 30 minutes before breakfast   saccharomyces boulardii 250 MG capsule Commonly known as: FLORASTOR Take 1 capsule (250 mg total) by mouth 2 (two) times daily for 14 days.        Procedures/Studies: CT Renal Stone Study Result Date: 09/07/2023 CLINICAL DATA:  Abdominal/flank pain, stone suspected uti, urgency, r/o ureteral stone or other process EXAM: CT ABDOMEN AND PELVIS WITHOUT CONTRAST TECHNIQUE: Multidetector CT imaging of the abdomen and pelvis was performed following the standard protocol without IV contrast. RADIATION DOSE REDUCTION: This exam was performed according to the departmental dose-optimization program which includes automated exposure control, adjustment of the mA and/or kV according to patient size and/or use of iterative reconstruction technique. COMPARISON:  CT pelvis dated 04/19/2016. FINDINGS: Lower chest: No acute abnormality. Hepatobiliary: No focal liver abnormality is seen. No gallstones, gallbladder wall thickening, or biliary dilatation. Pancreas: Calcifications in the pancreatic head may reflect sequela of chronic pancreatitis. Ductal dilation or surrounding inflammatory changes. Spleen: Normal in size without focal abnormality. Adrenals/Urinary Tract: The adrenal glands are unremarkable. No urolithiasis or hydronephrosis. Bladder is partially distended with circumferential bladder wall thickening. Stomach/Bowel: Stomach is within normal limits. Appendix appears normal. No evidence of bowel wall thickening, distention, or inflammatory  changes. Vascular/Lymphatic: Abdominal aorta is normal in caliber with atherosclerotic calcification. Similar enlarged left iliac node measures 14 mm in short axis (2:63), not significantly changed since prior exam dated 04/19/2016. Reproductive: Prostate is unremarkable. Other: No abdominal wall hernia. No abdominopelvic ascites. No intraperitoneal free air. Musculoskeletal: No acute osseous abnormality. No suspicious osseous lesion. Degenerative disc changes at L5-S1. IMPRESSION: 1.  No evidence of urolithiasis or hydronephrosis. 2. Circumferential bladder wall thickening is suspicious for cystitis. Recommend correlation with urinalysis. 3. Enlarged left iliac node is nonspecific and may be reactive, similar to the prior exam prior. 4.  Aortic Atherosclerosis (ICD10-I70.0). Electronically Signed   By: Harrietta Sherry M.D.   On: 09/07/2023 10:08     Subjective: Pt reports that his dysuria is resolved, he is feeling better, ambulating in room;   Discharge Exam: Vitals:   09/10/23 0004 09/10/23 0359  BP: (!) 105/57 105/61  Pulse: 89 (!) 57  Resp: 16 16  Temp: (!) 97.5 F (36.4 C) 98.4 F (36.9 C)  SpO2: 96% 97%   Vitals:   09/09/23 2018 09/09/23 2121 09/10/23 0004 09/10/23 0359  BP: 121/83 121/83 (!) 105/57 105/61  Pulse: 86 86 89 (!) 57  Resp: 20  16 16   Temp: 99 F (37.2 C)  (!) 97.5 F (36.4 C) 98.4 F (36.9 C)  TempSrc: Oral  Oral Oral  SpO2: 97%  96% 97%  Weight:      Height:       General: Pt is alert, awake, not in acute distress Cardiovascular: normal S1/S2 +, no rubs, no gallops Respiratory: CTA bilaterally, no wheezing, no rhonchi Abdominal: Soft, NT, ND, bowel sounds + Extremities: chronic peripheral edema, no cyanosis   The results of significant diagnostics from this hospitalization (including imaging, microbiology, ancillary and laboratory) are listed below for reference.     Microbiology: Recent Results (from the past 240 hours)  Urine Culture     Status:  Abnormal   Collection Time: 09/07/23  8:11 AM   Specimen: Urine, Clean Catch  Result Value Ref Range Status   Specimen Description   Final    URINE, CLEAN CATCH Performed at [facility], [address] [place], [place] [address]    Special Requests   Final    NONE Performed at [facility], [address], [place], [place] [address]    Culture (A)  Final    <10,000 COLONIES/mL INSIGNIFICANT GROWTH Performed at [facility], [address] [place], [place] [address]    Report Status 09/08/2023 FINAL  Final  MRSA Next Gen by PCR, Nasal     Status: None   Collection Time: 09/07/23 11:30 AM   Specimen: Nasal Mucosa; Nasal Swab  Result Value Ref Range Status   MRSA by PCR Next Gen NOT DETECTED NOT DETECTED Final    Comment: (NOTE) The GeneXpert MRSA Assay (FDA approved for NASAL specimens only), is one component of a comprehensive MRSA colonization surveillance program. It is not intended to diagnose MRSA infection nor to guide or monitor treatment for MRSA infections. Test performance is not FDA approved in patients less than 51 years old. Performed at [facility], [address] [place], [place] [address]      Labs: BNP (last 3 results) Recent Labs    02/10/23 0845  BNP 489.0*   Basic Metabolic Panel: Recent Labs  Lab 09/07/23 0745 09/08/23 0430 09/09/23 0435  NA 135 136 136  K 3.8 3.8 4.4  CL 100 103 104  CO2 22 24 23   GLUCOSE 155* 123* 101*  BUN 21 18 18   CREATININE 1.82* 1.37* 1.45*  CALCIUM  9.4 9.1 8.9  MG  --  2.0 2.1   Liver Function Tests: No results for input(s): AST, ALT, ALKPHOS, BILITOT, PROT, ALBUMIN in the last 168 hours. No results for input(s): LIPASE, AMYLASE in the last 168 hours. No results for input(s):  AMMONIA in the last 168 hours. CBC: Recent Labs  Lab 09/07/23 0745 09/08/23 0430 09/09/23 0435  WBC 16.7* 13.9* 14.7*  NEUTROABS 10.9* 9.2* 9.6*  HGB 15.6 14.4 15.2  HCT 48.3 44.8 45.9  MCV 94.5 94.3 96.0   PLT 339 286 289   Cardiac Enzymes: No results for input(s): CKTOTAL, CKMB, CKMBINDEX, TROPONINI in the last 168 hours. BNP: Invalid input(s): POCBNP CBG: Recent Labs  Lab 09/09/23 1637 09/09/23 1714 09/09/23 2058 09/10/23 0338 09/10/23 0727  GLUCAP 67* 125* 157* 122* 125*   D-Dimer No results for input(s): DDIMER in the last 72 hours. Hgb A1c No results for input(s): HGBA1C in the last 72 hours. Lipid Profile No results for input(s): CHOL, HDL, LDLCALC, TRIG, CHOLHDL, LDLDIRECT in the last 72 hours. Thyroid  function studies No results for input(s): TSH, T4TOTAL, T3FREE, THYROIDAB in the last 72 hours.  Invalid input(s): FREET3 Anemia work up No results for input(s): VITAMINB12, FOLATE, FERRITIN, TIBC, IRON, RETICCTPCT in the last 72 hours. Urinalysis    Component Value Date/Time   COLORURINE YELLOW 09/07/2023 0805   APPEARANCEUR HAZY (A) 09/07/2023 0805   LABSPEC 1.015 09/07/2023 0805   PHURINE 5.0 09/07/2023 0805   GLUCOSEU >=500 (A) 09/07/2023 0805   HGBUR NEGATIVE 09/07/2023 0805   BILIRUBINUR NEGATIVE 09/07/2023 0805   KETONESUR NEGATIVE 09/07/2023 0805   PROTEINUR 30 (A) 09/07/2023 0805   UROBILINOGEN 0.2 07/16/2011 1407   NITRITE NEGATIVE 09/07/2023 0805   LEUKOCYTESUR LARGE (A) 09/07/2023 0805   Sepsis Labs Recent Labs  Lab 09/07/23 0745 09/08/23 0430 09/09/23 0435  WBC 16.7* 13.9* 14.7*   Microbiology Recent Results (from the past 240 hours)  Urine Culture     Status: Abnormal   Collection Time: 09/07/23  8:11 AM   Specimen: Urine, Clean Catch  Result Value Ref Range Status   Specimen Description   Final    URINE, CLEAN CATCH Performed at [facility], [address] [place], [place] [address]    Special Requests   Final    NONE Performed at [facility], [address] [place], [place] [address]    Culture (A)  Final    <10,000 COLONIES/mL INSIGNIFICANT GROWTH Performed at [facility], [address] [place], [place] [address]    Report Status 09/08/2023 FINAL  Final  MRSA Next Gen by PCR, Nasal     Status: None   Collection Time: 09/07/23 11:30 AM   Specimen: Nasal Mucosa; Nasal Swab  Result Value Ref Range Status   MRSA by PCR Next Gen NOT DETECTED NOT DETECTED Final    Comment: (NOTE) The GeneXpert MRSA Assay (FDA approved for NASAL specimens only), is one component of a comprehensive MRSA colonization surveillance program. It is not intended to diagnose MRSA infection nor to guide or monitor treatment for MRSA infections. Test performance is not FDA approved in patients less than 67 years old. Performed at [facility], [address], [place], [place] [address]    Time coordinating discharge: 34 mins  SIGNED:  Afton Louder, MD  [facility] 09/10/2023, 10:35 AM How to contact the [facility] Attending or Consulting provider 7A - 7P or covering provider during after hours 7P -7A, for this patient?  Check the care team in [facility] and look for a) attending/consulting [facility] provider listed and b) the [facility] team listed Log into [email or web address] and use [place]'s universal password to access. If you do not have the password, please contact the hospital operator. Locate the [facility] provider you are  looking for under [facility] and page to a number that you can be directly reached. If you still have difficulty reaching the provider, please page the [facility] (Director on Call) for the Hospitalists listed on amion for assistance.

## 2023-09-10 NOTE — Discharge Instructions (Signed)
 IMPORTANT INFORMATION: PAY CLOSE ATTENTION   PHYSICIAN DISCHARGE INSTRUCTIONS  Follow with Primary care provider  Kevin Spar, MD  and other consultants as instructed by your Hospitalist Physician  SEEK MEDICAL CARE OR RETURN TO EMERGENCY ROOM IF SYMPTOMS COME BACK, WORSEN OR NEW PROBLEM DEVELOPS   Please note: You were cared for by a hospitalist during your hospital stay. Every effort will be made to forward records to your primary care provider.  You can request that your primary care provider send for your hospital records if they have not received them.  Once you are discharged, your primary care physician will handle any further medical issues. Please note that NO REFILLS for any discharge medications will be authorized once you are discharged, as it is imperative that you return to your primary care physician (or establish a relationship with a primary care physician if you do not have one) for your post hospital discharge needs so that they can reassess your need for medications and monitor your lab values.  Please get a complete blood count and chemistry panel checked by your Primary MD at your next visit, and again as instructed by your Primary MD.  Get Medicines reviewed and adjusted: Please take all your medications with you for your next visit with your Primary MD  Laboratory/radiological data: Please request your Primary MD to go over all hospital tests and procedure/radiological results at the follow up, please ask your primary care provider to get all Hospital records sent to his/her office.  In some cases, they will be blood work, cultures and biopsy results pending at the time of your discharge. Please request that your primary care provider follow up on these results.  If you are diabetic, please bring your blood sugar readings with you to your follow up appointment with primary care.    Please call and make your follow up appointments as soon as possible.     Also Note the following: If you experience worsening of your admission symptoms, develop shortness of breath, life threatening emergency, suicidal or homicidal thoughts you must seek medical attention immediately by calling 911 or calling your MD immediately  if symptoms less severe.  You must read complete instructions/literature along with all the possible adverse reactions/side effects for all the Medicines you take and that have been prescribed to you. Take any new Medicines after you have completely understood and accpet all the possible adverse reactions/side effects.   Do not drive when taking Pain medications or sleeping medications (Benzodiazepines)  Do not take more than prescribed Pain, Sleep and Anxiety Medications. It is not advisable to combine anxiety,sleep and pain medications without talking with your primary care practitioner  Special Instructions: If you have smoked or chewed Tobacco  in the last 2 yrs please stop smoking, stop any regular Alcohol  and or any Recreational drug use.  Wear Seat belts while driving.  Do not drive if taking any narcotic, mind altering or controlled substances or recreational drugs or alcohol.

## 2023-09-12 ENCOUNTER — Telehealth: Payer: Self-pay

## 2023-09-12 NOTE — Transitions of Care (Post Inpatient/ED Visit) (Signed)
 09/12/2023  Name: Kevin Goodman MRN: [id number] DOB: 1950-08-25  Today's TOC FU Call Status: Today's TOC FU Call Status:: Successful TOC FU Call Completed TOC FU Call Complete Date: 09/12/23 Patient's Name and Date of Birth confirmed.  Transition Care Management Follow-up Telephone Call Date of Discharge: 09/10/23 [facility] Type of Discharge: Inpatient Admission Primary Inpatient Discharge Diagnosis:: Atrial fibrillation with RVR, UTI How have you been since you were released from the hospital?: Better Any questions or concerns?: No  Items Reviewed: Did you receive and understand the discharge instructions provided?: Yes Medications obtained,verified, and reconciled?: Yes (Medications Reviewed) Any new allergies since your discharge?: No Dietary orders reviewed?: Yes Type of Diet Ordered:: heart healthy/carb modified Do you have support at home?: Yes People in Home [RPT]: child(ren), adult Name of Support/Comfort Primary Source: Kevin Goodman  Medications Reviewed Today: Medications Reviewed Today     Reviewed by Lucah Petta, RN (Case Manager) on 09/12/23 at 1341  Med List Status: <None>   Medication Order Taking? Sig Documenting Provider Last Dose Status Informant  acetaminophen  (TYLENOL ) 500 MG tablet [id number] Yes Take 1,000 mg by mouth every 6 (six) hours as needed for moderate pain. [provider]  Active Self, Pharmacy Records  Allopurinol  200 MG TABS [id number] Yes Take 1 tablet by mouth daily. [provider]  Active Self, Pharmacy Records  ALPRAZolam  (XANAX ) 1 MG tablet [id number]  Take one tablet by mouth 1 hour prior to MRI  Patient not taking: Reported on 09/12/2023   Kevin Hai, MD  Active Self, Pharmacy Records  apixaban  (ELIQUIS ) 5 MG TABS tablet [id number] Yes Take 1 tablet (5 mg total) by mouth 2 (two) times daily. Kevin Afton LITTIE, MD  Active Self, Pharmacy Records           Med Note MAZIE, [facility]   Mon Jun 07, 2022 11:37 AM)    atorvastatin  (LIPITOR) 20 MG tablet [id number] Yes Take 20 mg by mouth daily. [provider]  Active Self, Pharmacy Records  B-D UF III MINI PEN NEEDLES 31G X 5 MM MISC [id number] Yes  [provider]  Active Self, Pharmacy Records  calcitRIOL (ROCALTROL) 0.25 MCG capsule [id number] Yes Take 0.25 mcg by mouth 2 (two) times a week. [provider]  Active Self, Pharmacy Records  cephALEXin  (KEFLEX ) 500 MG capsule [id number] Yes Take 1 capsule (500 mg total) by mouth 3 (three) times daily for 4 days. Kevin, Clanford L, MD  Active   diltiazem  (TIAZAC ) 360 MG 24 hr capsule [id number] Yes Take 1 capsule (360 mg total) by mouth daily. Kevin Fines, MD  Active Self, Pharmacy Records  ferrous sulfate  325 [phone] FE) MG EC tablet [id number] Yes Take 325 mg by mouth daily. [provider]  Active Self, Pharmacy Records  furosemide  (LASIX ) 40 MG tablet [id number] Yes Take 40 mg by mouth 2 (two) times daily. [provider]  Active Self, Pharmacy Records  hydrALAZINE  (APRESOLINE ) 100 MG tablet [id number] Yes Take 100 mg by mouth 2 (two) times daily. [provider]  Active Self, Pharmacy Records  LANTUS  SOLOSTAR 100 UNIT/ML Solostar Pen [id number] Yes Inject 40 Units into the skin at bedtime. [provider]  Active Self, Pharmacy Records  losartan  (COZAAR ) 100 MG tablet [id number] Yes Take 1 tablet (100 mg total) by mouth daily. Kevin Laymon HERO, PA-C  Active Self, Pharmacy Records  metoprolol  tartrate (LOPRESSOR ) 50 MG tablet [id number] Yes Take 1 tablet (50 mg total) by mouth 2 (two) times daily. Kevin,  Eric, MD  Active Self, Pharmacy Records  pantoprazole  (PROTONIX ) 40 MG tablet [id number] Yes Take 1 tablet (40 mg total) by mouth 2 (two) times daily before a meal. 30 minutes before breakfast Kevin Carlin POUR, DO  Active Self, Pharmacy Records  saccharomyces boulardii [facility]) 250 MG capsule [id number] Yes Take 1 capsule (250 mg  total) by mouth 2 (two) times daily for 14 days. Kevin Afton CROME, MD  Active             Home Care and Equipment/Supplies: Were Home Health Services Ordered?: NA Any new equipment or medical supplies ordered?: NA  Functional Questionnaire: Do you need assistance with bathing/showering or dressing?: No Do you need assistance with meal preparation?: No Do you need assistance with eating?: No Do you have difficulty maintaining continence: No Do you need assistance with getting out of bed/getting out of a chair/moving?: No Do you have difficulty managing or taking your medications?: No  Follow up appointments reviewed: PCP Follow-up appointment confirmed?: No (Patient to call) MD Provider Line Number:[phone] Given: No Specialist Hospital Follow-up appointment confirmed?: NA Do you need transportation to your follow-up appointment?: No Do you understand care options if your condition(s) worsen?: Yes-patient verbalized understanding  SDOH Interventions Today    Flowsheet Row Most Recent Value  SDOH Interventions   Food Insecurity Interventions Intervention Not Indicated  Housing Interventions Intervention Not Indicated  Transportation Interventions Intervention Not Indicated  Utilities Interventions Intervention Not Indicated    Goals Addressed             This Visit's Progress    VBCI Transitions of Care (TOC) Care Plan       Problems:  Recent Hospitalization for treatment of UTI Knowledge Deficit Related to UTI and Atrial Fibrillation  Goal:  Over the next 30 days, the patient will not experience hospital readmission  Interventions:  Transitions of Care: Doctor Visits  - discussed the importance of doctor visits Reviewed Signs and symptoms of infection  AFIB Interventions:   Counseled on increased risk of stroke due to Afib and benefits of anticoagulation for stroke prevention Reviewed importance of adherence to anticoagulant exactly as prescribed        See physicians as scheduled       Monitor for chest pain, constant flutters in chest, shortness of breath, weakness, and feeling of passing out.         Monitor heart rate       UTI Interventions Drink plenty of water  or other fluids. Take antibiotics exactly as your healthcare provider tells you. Monitor for fever, chills, increased urination, burning on urination, blood in urine, nausea, vomiting, or pain in abdomen  Patient Self Care Activities:  Attend all scheduled provider appointments Call provider office for new concerns or questions  Notify RN Care Manager of TOC call rescheduling needs Participate in Transition of Care Program/Attend TOC scheduled calls Take medications as prescribed   check pulse (heart) rate once a day make a plan to exercise regularly make a plan to eat healthy Call PCP for follow up appointment  Plan:  Telephone follow up appointment with care management team member scheduled for:  09/20/23 1000 am The patient has been provided with contact information for the care management team and has been advised to call with any health related questions or concerns.          Micca Matura J. Jensen Cheramie RN, MSN [facility], [facility] RN Care Manager Direct Dial: [phone]  Fax: [phone] Website:  [email or web address]

## 2023-09-12 NOTE — Patient Instructions (Addendum)
 Visit Information  Thank you for taking time to visit with me today. Please don't hesitate to contact me if I can be of assistance to you before our next scheduled telephone appointment.  Our next appointment is by telephone on 09/20/23 at 1000 am  Following is a copy of your care plan:   Goals Addressed             This Visit's Progress    VBCI Transitions of Care (TOC) Care Plan       Problems:  Recent Hospitalization for treatment of UTI Knowledge Deficit Related to UTI and Atrial Fibrillation  Goal:  Over the next 30 days, the patient will not experience hospital readmission  Interventions:  Transitions of Care: Doctor Visits  - discussed the importance of doctor visits Reviewed Signs and symptoms of infection  AFIB Interventions:   Counseled on increased risk of stroke due to Afib and benefits of anticoagulation for stroke prevention Reviewed importance of adherence to anticoagulant exactly as prescribed       See physicians as scheduled       Monitor for chest pain, constant flutters in chest, shortness of breath, weakness, and feeling of passing out.         Monitor heart rate       UTI Interventions Drink plenty of water  or other fluids. Take antibiotics exactly as your healthcare provider tells you. Monitor for fever, chills, increased urination, burning on urination, blood in urine, nausea, vomiting, or pain in abdomen  Patient Self Care Activities:  Attend all scheduled provider appointments Call provider office for new concerns or questions  Notify RN Care Manager of TOC call rescheduling needs Participate in Transition of Care Program/Attend TOC scheduled calls Take medications as prescribed   check pulse (heart) rate once a day make a plan to exercise regularly make a plan to eat healthy Call PCP for follow up appointment  Plan:  Telephone follow up appointment with care management team member scheduled for:  09/20/23 1000 am The patient has been  provided with contact information for the care management team and has been advised to call with any health related questions or concerns.         The patient verbalized understanding of instructions, educational materials, and care plan provided today and DECLINED offer to receive copy of patient instructions, educational materials, and care plan.   The patient has been provided with contact information for the care management team and has been advised to call with any health related questions or concerns.   Please call the care guide team at [phone] if you need to cancel or reschedule your appointment.   Please call the Suicide and Crisis Lifeline: 988 if you are experiencing a Mental Health or Behavioral Health Crisis or need someone to talk to.  Sulamita Lafountain J. Yomira Flitton RN, MSN [employer], [facility] RN Care Manager Direct Dial: [phone]  Fax: [phone] Website: [email or web address]

## 2023-09-16 DIAGNOSIS — I480 Paroxysmal atrial fibrillation: Secondary | ICD-10-CM | POA: Diagnosis not present

## 2023-09-16 DIAGNOSIS — N1832 Chronic kidney disease, stage 3b: Secondary | ICD-10-CM | POA: Diagnosis not present

## 2023-09-16 DIAGNOSIS — N3000 Acute cystitis without hematuria: Secondary | ICD-10-CM | POA: Diagnosis not present

## 2023-09-16 DIAGNOSIS — E1165 Type 2 diabetes mellitus with hyperglycemia: Secondary | ICD-10-CM | POA: Diagnosis not present

## 2023-09-16 DIAGNOSIS — I10 Essential (primary) hypertension: Secondary | ICD-10-CM | POA: Diagnosis not present

## 2023-09-20 ENCOUNTER — Other Ambulatory Visit: Payer: Self-pay

## 2023-09-20 NOTE — Transitions of Care (Post Inpatient/ED Visit) (Signed)
 Transition of Care week 2  Visit Note  09/20/2023  Name: Kevin Goodman MRN: [id number]          DOB: 1951/03/08  Situation: Patient enrolled in [facility] 30-day program. Visit completed with patient by telephone.   Background:   Initial Transition Care Management Follow-up Telephone Call    Past Medical History:  Diagnosis Date   Atrial fibrillation (HCC)    a. initialy occurring in 2013  b. recurrence in 06/2020 in the setting of COVID-19 --> s/p DCCV in 07/2020   Chronic kidney disease, stage 3a (HCC)    Diabetes mellitus, type 2 (HCC)    Dysrhythmia    GERD (gastroesophageal reflux disease)    Gout    Hypertension    Mild pulmonary hypertension (HCC)    Morbid obesity (HCC)    OSA (obstructive sleep apnea)    Paroxysmal atrial flutter (HCC)    Sleep apnea     Assessment: Patient Reported Symptoms: Cognitive Cognitive Status: Alert and oriented to person, place, and time, Normal speech and language skills      Neurological Neurological Review of Symptoms: No symptoms reported    HEENT HEENT Symptoms Reported: No symptoms reported      Cardiovascular Cardiovascular Symptoms Reported: No symptoms reported    Respiratory Respiratory Symptoms Reported: No symptoms reported    Endocrine Endocrine Symptoms Reported: No symptoms reported    Gastrointestinal Gastrointestinal Symptoms Reported: No symptoms reported      Genitourinary Additional Genitourinary Details: Urinary Frequency Genitourinary Comment: Drink plenty of water  or other fluids.  Take antibiotics exactly as your healthcare provider tells you.  Monitor for fever, chills, increased urination, burning on urination, blood in urine, nausea, vomiting, or pain in abdomen  Integumentary Integumentary Symptoms Reported: No symptoms reported    Musculoskeletal Musculoskelatal Symptoms Reviewed: No symptoms reported        Psychosocial Psychosocial Symptoms Reported: No symptoms reported         There were  no vitals filed for this visit.  Medications Reviewed Today     Reviewed by Kittie Krizan, RN (Case Manager) on 09/20/23 at 1006  Med List Status: <None>   Medication Order Taking? Sig Documenting Provider Last Dose Status Informant  acetaminophen  (TYLENOL ) 500 MG tablet [id number] Yes Take 1,000 mg by mouth every 6 (six) hours as needed for moderate pain. [provider]  Active Self, Pharmacy Records  Allopurinol  200 MG TABS [id number] Yes Take 1 tablet by mouth daily. [provider]  Active Self, Pharmacy Records  ALPRAZolam  (XANAX ) 1 MG tablet [id number]  Take one tablet by mouth 1 hour prior to MRI  Patient not taking: Reported on 09/12/2023   Rogers Hai, MD  Active Self, Pharmacy Records  apixaban  (ELIQUIS ) 5 MG TABS tablet [id number] Yes Take 1 tablet (5 mg total) by mouth 2 (two) times daily. Vicci Afton LITTIE, MD  Active Self, Pharmacy Records           Med Note MAZIE, [facility]   Mon Jun 07, 2022 11:37 AM)    atorvastatin  (LIPITOR) 20 MG tablet [id number] Yes Take 20 mg by mouth daily. [provider]  Active Self, Pharmacy Records  B-D UF III MINI PEN NEEDLES 31G X 5 MM MISC [id number] Yes  [provider]  Active Self, Pharmacy Records  calcitRIOL (ROCALTROL) 0.25 MCG capsule [id number] Yes Take 0.25 mcg by mouth 2 (two) times a week. [provider]  Active Self, Pharmacy Records  diltiazem  (TIAZAC ) 360 MG 24 hr capsule  [id number] Yes Take 1 capsule (360 mg total) by mouth daily. Ricky Fines, MD  Active Self, Pharmacy Records  ferrous sulfate  325 [phone] FE) MG EC tablet [id number] Yes Take 325 mg by mouth daily. [provider]  Active Self, Pharmacy Records  furosemide  (LASIX ) 40 MG tablet [id number] Yes Take 40 mg by mouth 2 (two) times daily. [provider]  Active Self, Pharmacy Records  hydrALAZINE  (APRESOLINE ) 100 MG tablet [id number] Yes Take 100 mg by mouth 2 (two) times daily. [provider]  Active  Self, Pharmacy Records  LANTUS  SOLOSTAR 100 UNIT/ML Solostar Pen [id number] Yes Inject 40 Units into the skin at bedtime. [provider]  Active Self, Pharmacy Records  losartan  (COZAAR ) 100 MG tablet [id number] Yes Take 1 tablet (100 mg total) by mouth daily. Johnson Laymon HERO, PA-C  Active Self, Pharmacy Records  metoprolol  tartrate (LOPRESSOR ) 50 MG tablet [id number] Yes Take 1 tablet (50 mg total) by mouth 2 (two) times daily. Ricky Fines, MD  Active Self, Pharmacy Records  pantoprazole  (PROTONIX ) 40 MG tablet [id number] Yes Take 1 tablet (40 mg total) by mouth 2 (two) times daily before a meal. 30 minutes before breakfast Cindie Carlin POUR, DO  Active Self, Pharmacy Records  saccharomyces boulardii [facility]) 250 MG capsule [id number] Yes Take 1 capsule (250 mg total) by mouth 2 (two) times daily for 14 days. Vicci Afton CROME, MD  Active             Recommendation:   Continue Current Plan of Care  Follow Up Plan:   Telephone follow-up in 1 week  Ranjit Ashurst J. Jahzeel Poythress RN, MSN [facility], [facility] RN Care Manager Direct Dial: [phone]  Fax: [phone] Website: [email or web address]

## 2023-09-20 NOTE — Patient Instructions (Signed)
 Visit Information  Thank you for taking time to visit with me today. Please don't hesitate to contact me if I can be of assistance to you before our next scheduled telephone appointment.  Our next appointment is by telephone on 09/27/23 at 1000 am  Following is a copy of your care plan:   Goals Addressed             This Visit's Progress    VBCI Transitions of Care (TOC) Care Plan       Problems:  Recent Hospitalization for treatment of UTI Knowledge Deficit Related to UTI and Atrial Fibrillation  Goal:  Over the next 30 days, the patient will not experience hospital readmission  Interventions:  Transitions of Care: Doctor Visits  - discussed the importance of doctor visits Reviewed Signs and symptoms of infection  AFIB Interventions:   Counseled on increased risk of stroke due to Afib and benefits of anticoagulation for stroke prevention Reviewed importance of adherence to anticoagulant exactly as prescribed       See physicians as scheduled       Monitor for chest pain, constant flutters in chest, shortness of breath, weakness, and feeling of passing out.         Monitor heart rate       UTI Interventions Drink plenty of water  or other fluids. Take antibiotics exactly as your healthcare provider tells you. Monitor for fever, chills, increased urination, burning on urination, blood in urine, nausea, vomiting, or pain in abdomen  Patient Self Care Activities:  Attend all scheduled provider appointments Call provider office for new concerns or questions  Notify RN Care Manager of TOC call rescheduling needs Participate in Transition of Care Program/Attend TOC scheduled calls Take medications as prescribed   make a plan to exercise regularly make a plan to eat healthy take medicine as prescribed   Plan:  Telephone follow up appointment with care management team member scheduled for:  09/27/23 1000 am The patient has been provided with contact information for the care  management team and has been advised to call with any health related questions or concerns.         The patient verbalized understanding of instructions, educational materials, and care plan provided today and DECLINED offer to receive copy of patient instructions, educational materials, and care plan.   The patient has been provided with contact information for the care management team and has been advised to call with any health related questions or concerns.   Please call the care guide team at [phone] if you need to cancel or reschedule your appointment.   Please call the Suicide and Crisis Lifeline: 988 if you are experiencing a Mental Health or Behavioral Health Crisis or need someone to talk to.  Keslyn Teater J. Britania Shreeve RN, MSN [facility], [facility] RN Care Manager Direct Dial: [phone]  Fax: [phone] Website: [email or web address]

## 2023-09-27 ENCOUNTER — Other Ambulatory Visit: Payer: Self-pay

## 2023-09-27 NOTE — Patient Instructions (Signed)
 Visit Information  Thank you for taking time to visit with me today. Please don't hesitate to contact me if I can be of assistance to you before our next scheduled telephone appointment.  Our next appointment is by telephone on 10/06/23 at 1030 am  Following is a copy of your care plan:   Goals Addressed             This Visit's Progress    VBCI Transitions of Care (TOC) Care Plan       Problems:  Recent Hospitalization for treatment of UTI Knowledge Deficit Related to UTI and Atrial Fibrillation  Goal:  Over the next 30 days, the patient will not experience hospital readmission  Interventions:  Transitions of Care: Doctor Visits  - discussed the importance of doctor visits Reviewed Signs and symptoms of infection  AFIB Interventions:   Counseled on increased risk of stroke due to Afib and benefits of anticoagulation for stroke prevention Reviewed importance of adherence to anticoagulant exactly as prescribed       See physicians as scheduled       Monitor for chest pain, constant flutters in chest, shortness of breath, weakness, and feeling of passing out.         Monitor heart rate        Patient Self Care Activities:  Attend all scheduled provider appointments Call provider office for new concerns or questions  Notify RN Care Manager of [facility] call rescheduling needs Participate in Transition of Care Program/Attend TOC scheduled calls Take medications as prescribed   make a plan to exercise regularly make a plan to eat healthy take medicine as prescribed Attend Urology appointment 09/29/23   Plan:  The patient has been provided with contact information for the care management team and has been advised to call with any health related questions or concerns.         The patient verbalized understanding of instructions, educational materials, and care plan provided today and DECLINED offer to receive copy of patient instructions, educational materials, and care plan.    The patient has been provided with contact information for the care management team and has been advised to call with any health related questions or concerns.   Please call the care guide team at [phone] if you need to cancel or reschedule your appointment.   Please call the Suicide and Crisis Lifeline: 988 if you are experiencing a Mental Health or Behavioral Health Crisis or need someone to talk to.  Farrin Shadle J. Meghin Thivierge RN, MSN [facility], [facility] RN Care Manager Direct Dial: [phone]  Fax: [phone] Website: [email or web address]

## 2023-09-27 NOTE — Transitions of Care (Post Inpatient/ED Visit) (Addendum)
 Transition of Care week 3  Visit Note  09/27/2023  Name: Kevin Goodman MRN: [id number]          DOB: 05/30/1950  Situation: Patient enrolled in [facility] 30-day program. Visit completed with patient by telephone.   Background:   Initial Transition Care Management Follow-up Telephone Call    Past Medical History:  Diagnosis Date   Atrial fibrillation (HCC)    a. initialy occurring in 2013  b. recurrence in 06/2020 in the setting of COVID-19 --> s/p DCCV in 07/2020   Chronic kidney disease, stage 3a (HCC)    Diabetes mellitus, type 2 (HCC)    Dysrhythmia    GERD (gastroesophageal reflux disease)    Gout    Hypertension    Mild pulmonary hypertension (HCC)    Morbid obesity (HCC)    OSA (obstructive sleep apnea)    Paroxysmal atrial flutter (HCC)    Sleep apnea     Assessment: Patient Reported Symptoms: Cognitive Cognitive Status: Alert and oriented to person, place, and time, Normal speech and language skills      Neurological Neurological Review of Symptoms: No symptoms reported    HEENT HEENT Symptoms Reported: No symptoms reported      Cardiovascular Cardiovascular Symptoms Reported: No symptoms reported    Respiratory Respiratory Symptoms Reported: No symptoms reported    Endocrine Endocrine Symptoms Reported: No symptoms reported Is patient diabetic?: Yes Is patient checking blood sugars at home?: Yes List most recent blood sugar readings, include date and time of day: Reports sugars around 150 Endocrine Self-Management Outcome: 4 (good)  Gastrointestinal Gastrointestinal Symptoms Reported: No symptoms reported      Genitourinary Genitourinary Symptoms Reported: Frequency Additional Genitourinary Details: Patient to see Urology 09/29/23 Genitourinary Self-Management Outcome: 3 (uncertain) Genitourinary Comment: Drink plenty of water  or other fluids.  Take antibiotics exactly as your healthcare provider tells you.  Monitor for fever, chills, increased urination,  burning on urination, blood in urine, nausea, vomiting, or pain in abdomen  Integumentary Integumentary Symptoms Reported: No symptoms reported    Musculoskeletal Musculoskelatal Symptoms Reviewed: No symptoms reported        Psychosocial Psychosocial Symptoms Reported: No symptoms reported         There were no vitals filed for this visit.  Medications Reviewed Today     Reviewed by Andrya Roppolo, RN (Case Manager) on 09/27/23 at 1009  Med List Status: <None>   Medication Order Taking? Sig Documenting Provider Last Dose Status Informant  acetaminophen  (TYLENOL ) 500 MG tablet [id number] Yes Take 1,000 mg by mouth every 6 (six) hours as needed for moderate pain. [provider]  Active Self, Pharmacy Records  Allopurinol  200 MG TABS [id number] Yes Take 1 tablet by mouth daily. [provider]  Active Self, Pharmacy Records  ALPRAZolam  (XANAX ) 1 MG tablet [id number]  Take one tablet by mouth 1 hour prior to MRI  Patient not taking: Reported on 09/27/2023   Katragadda, Sreedhar, MD  Active Self, Pharmacy Records  apixaban  (ELIQUIS ) 5 MG TABS tablet [id number] Yes Take 1 tablet (5 mg total) by mouth 2 (two) times daily. Vicci Afton LITTIE, MD  Active Self, Pharmacy Records           Med Note MAZIE, [facility]   Mon Jun 07, 2022 11:37 AM)    atorvastatin  (LIPITOR) 20 MG tablet [id number] Yes Take 20 mg by mouth daily. [provider]  Active Self, Pharmacy Records  B-D UF III MINI PEN NEEDLES 31G X 5 MM MISC [id number] Yes  [provider]  Active Self, Pharmacy Records  calcitRIOL (ROCALTROL) 0.25 MCG capsule [id number] Yes Take 0.25 mcg by mouth 2 (two) times a week. [provider]  Active Self, Pharmacy Records  diltiazem  (TIAZAC ) 360 MG 24 hr capsule [id number] Yes Take 1 capsule (360 mg total) by mouth daily. Ricky Fines, MD  Active Self, Pharmacy Records  ferrous sulfate  325 [id number]) MG EC tablet [id number] Yes Take 325 mg by mouth daily. [provider]  Active Self, Pharmacy Records  furosemide  (LASIX ) 40 MG tablet [id number] Yes Take 40 mg by mouth 2 (two) times daily. [provider]  Active Self, Pharmacy Records  hydrALAZINE  (APRESOLINE ) 100 MG tablet [id number] Yes Take 100 mg by mouth 2 (two) times daily. [provider]  Active Self, Pharmacy Records  LANTUS  SOLOSTAR 100 UNIT/ML Solostar Pen [id number] Yes Inject 40 Units into the skin at bedtime. [provider]  Active Self, Pharmacy Records  losartan  (COZAAR ) 100 MG tablet [id number] Yes Take 1 tablet (100 mg total) by mouth daily. Johnson Laymon HERO, PA-C  Active Self, Pharmacy Records  metoprolol  tartrate (LOPRESSOR ) 50 MG tablet [id number] Yes Take 1 tablet (50 mg total) by mouth 2 (two) times daily. Ricky Fines, MD  Active Self, Pharmacy Records  pantoprazole  (PROTONIX ) 40 MG tablet [id number] Yes Take 1 tablet (40 mg total) by mouth 2 (two) times daily before a meal. 30 minutes before breakfast Cindie Carlin POUR, DO  Active Self, Pharmacy Records            Goals Addressed             This Visit's Progress    VBCI Transitions of Care (TOC) Care Plan       Problems:  Recent Hospitalization for treatment of UTI Knowledge Deficit Related to UTI and Atrial Fibrillation  Goal:  Over the next 30 days, the patient will not experience hospital readmission  Interventions:  Transitions of Care: Doctor Visits  - discussed the importance of doctor visits Reviewed Signs and symptoms of infection  AFIB Interventions:   Counseled on increased risk of stroke due to Afib and benefits of anticoagulation for stroke prevention Reviewed importance of adherence to anticoagulant exactly as prescribed       See physicians as scheduled       Monitor for chest pain, constant flutters in chest, shortness of breath, weakness, and feeling of passing out.         Monitor heart rate        Patient Self Care Activities:  Attend all scheduled provider  appointments Call provider office for new concerns or questions  Notify RN Care Manager of [facility] call rescheduling needs Participate in Transition of Care Program/Attend TOC scheduled calls Take medications as prescribed   make a plan to exercise regularly make a plan to eat healthy take medicine as prescribed Attend Urology appointment 09/29/23   Plan:  The patient has been provided with contact information for the care management team and has been advised to call with any health related questions or concerns.         Recommendation:   Continue Current Plan of Care  Follow Up Plan:   Telephone follow-up in 1 week  Janaisa Birkland J. Anaaya Fuster RN, MSN [facility], [facility] RN Care Manager Direct Dial: [phone]  Fax: [phone] Website: [email or web address]

## 2023-09-29 DIAGNOSIS — N3000 Acute cystitis without hematuria: Secondary | ICD-10-CM | POA: Diagnosis not present

## 2023-09-29 DIAGNOSIS — R351 Nocturia: Secondary | ICD-10-CM | POA: Diagnosis not present

## 2023-10-06 ENCOUNTER — Telehealth: Payer: Self-pay

## 2023-10-07 ENCOUNTER — Telehealth: Payer: Self-pay

## 2023-10-07 NOTE — Transitions of Care (Post Inpatient/ED Visit) (Signed)
 Transition of Care week 4  Visit Note  10/07/2023  Name: Kevin Goodman MRN: [id number]          DOB: 1950/08/15  Situation: Patient enrolled in [facility] 30-day program. Visit completed with patient by telephone.   Background:   Initial Transition Care Management Follow-up Telephone Call    Past Medical History:  Diagnosis Date   Atrial fibrillation (HCC)    a. initialy occurring in 2013  b. recurrence in 06/2020 in the setting of COVID-19 --> s/p DCCV in 07/2020   Chronic kidney disease, stage 3a (HCC)    Diabetes mellitus, type 2 (HCC)    Dysrhythmia    GERD (gastroesophageal reflux disease)    Gout    Hypertension    Mild pulmonary hypertension (HCC)    Morbid obesity (HCC)    OSA (obstructive sleep apnea)    Paroxysmal atrial flutter (HCC)    Sleep apnea     Assessment: Patient Reported Symptoms: Cognitive Cognitive Status: Alert and oriented to person, place, and time, Normal speech and language skills      Neurological Neurological Review of Symptoms: No symptoms reported    HEENT HEENT Symptoms Reported: No symptoms reported      Cardiovascular Cardiovascular Symptoms Reported: No symptoms reported Cardiovascular Comment: Patient has no heart rate or blood pressure to report.  Respiratory Respiratory Symptoms Reported: No symptoms reported    Endocrine Endocrine Symptoms Reported: No symptoms reported Is patient diabetic?: Yes Is patient checking blood sugars at home?: Yes List most recent blood sugar readings, include date and time of day: Sugars around 150 per patient Endocrine Self-Management Outcome: 4 (good)  Gastrointestinal Gastrointestinal Symptoms Reported: No symptoms reported      Genitourinary Genitourinary Symptoms Reported: Frequency, Difficulty initiating stream Additional Genitourinary Details: Patient seen by urology Proscar 5 mg started Genitourinary Management Strategies: Medication therapy Genitourinary Self-Management Outcome: 3  (uncertain)  Integumentary Integumentary Symptoms Reported: No symptoms reported    Musculoskeletal Musculoskelatal Symptoms Reviewed: No symptoms reported        Psychosocial Psychosocial Symptoms Reported: No symptoms reported         There were no vitals filed for this visit.  Medications Reviewed Today     Reviewed by Analayah Brooke, RN (Case Manager) on 10/07/23 at 1053  Med List Status: <None>   Medication Order Taking? Sig Documenting Provider Last Dose Status Informant  acetaminophen  (TYLENOL ) 500 MG tablet [id number] Yes Take 1,000 mg by mouth every 6 (six) hours as needed for moderate pain. [provider]  Active Self, Pharmacy Records  Allopurinol  200 MG TABS [id number] Yes Take 1 tablet by mouth daily. [provider]  Active Self, Pharmacy Records  ALPRAZolam  (XANAX ) 1 MG tablet [id number]  Take one tablet by mouth 1 hour prior to MRI  Patient not taking: Reported on 10/07/2023   Rogers Hai, MD  Active Self, Pharmacy Records  apixaban  (ELIQUIS ) 5 MG TABS tablet [id number] Yes Take 1 tablet (5 mg total) by mouth 2 (two) times daily. Vicci Afton LITTIE, MD  Active Self, Pharmacy Records           Med Note MAZIE, [facility]   Mon Jun 07, 2022 11:37 AM)    atorvastatin  (LIPITOR) 20 MG tablet [id number] Yes Take 20 mg by mouth daily. [provider]  Active Self, Pharmacy Records  B-D UF III MINI PEN NEEDLES 31G X 5 MM MISC [id number] Yes  [provider]  Active Self, Pharmacy Records  calcitRIOL (ROCALTROL) 0.25 MCG capsule [id number]  Yes Take 0.25 mcg by mouth 2 (two) times a week. [provider]  Active Self, Pharmacy Records  diltiazem  (TIAZAC ) 360 MG 24 hr capsule [id number] Yes Take 1 capsule (360 mg total) by mouth daily. Ricky Fines, MD  Active Self, Pharmacy Records  ferrous sulfate  325 [phone] FE) MG EC tablet [id number] Yes Take 325 mg by mouth daily. [provider]  Active Self, Pharmacy Records  finasteride  (PROSCAR) 5 MG tablet [id number] Yes Take 5 mg by mouth daily. [provider]  Active   furosemide  (LASIX ) 40 MG tablet [id number] Yes Take 40 mg by mouth 2 (two) times daily. [provider]  Active Self, Pharmacy Records  hydrALAZINE  (APRESOLINE ) 100 MG tablet [id number] Yes Take 100 mg by mouth 2 (two) times daily. [provider]  Active Self, Pharmacy Records  LANTUS  SOLOSTAR 100 UNIT/ML Solostar Pen [id number] Yes Inject 40 Units into the skin at bedtime. [provider]  Active Self, Pharmacy Records  losartan  (COZAAR ) 100 MG tablet [id number] Yes Take 1 tablet (100 mg total) by mouth daily. Johnson Laymon HERO, PA-C  Active Self, Pharmacy Records  metoprolol  tartrate (LOPRESSOR ) 50 MG tablet [id number] Yes Take 1 tablet (50 mg total) by mouth 2 (two) times daily. Ricky Fines, MD  Active Self, Pharmacy Records  pantoprazole  (PROTONIX ) 40 MG tablet [id number] Yes Take 1 tablet (40 mg total) by mouth 2 (two) times daily before a meal. 30 minutes before breakfast Cindie Carlin POUR, DO  Active Self, Pharmacy Records            Goals Addressed             This Visit's Progress    VBCI Transitions of Care (TOC) Care Plan       Problems:  Recent Hospitalization for treatment of UTI Knowledge Deficit Related to UTI and Atrial Fibrillation Patient having problems with urine stream.  Seem by urology 09/29/23. Proscar 5 mg ordered  Goal:  Over the next 30 days, the patient will not experience hospital readmission  Interventions:  Transitions of Care: Doctor Visits  - discussed the importance of doctor visits Reviewed Signs and symptoms of infection  AFIB Interventions:   Counseled on increased risk of stroke due to Afib and benefits of anticoagulation for stroke prevention Reviewed importance of adherence to anticoagulant exactly as prescribed       See physicians as scheduled       Monitor for chest pain, constant flutters in chest, shortness of  breath, weakness, and feeling of passing out.         Monitor heart rate        Patient Self Care Activities:  Attend all scheduled provider appointments Call provider office for new concerns or questions  Notify RN Care Manager of TOC call rescheduling needs Participate in Transition of Care Program/Attend TOC scheduled calls Take medications as prescribed   make a plan to exercise regularly make a plan to eat healthy take medicine as prescribed    Plan:  The patient has been provided with contact information for the care management team and has been advised to call with any health related questions or concerns.          Recommendation:   Continue Current Plan of Care  Follow Up Plan:   Telephone follow-up in 1 week  Tashonda Pinkus J. Marielena Harvell RN, MSN [facility], [facility] RN Care Manager Direct Dial: [phone]  Fax: [phone] Website: [email or web address]

## 2023-10-07 NOTE — Patient Instructions (Signed)
 Visit Information  Thank you for taking time to visit with me today. Please don't hesitate to contact me if I can be of assistance to you before our next scheduled telephone appointment.  Our next appointment is by telephone on 10/12/23 at 1030 am  Following is a copy of your care plan:   Goals Addressed             This Visit's Progress    VBCI Transitions of Care (TOC) Care Plan       Problems:  Recent Hospitalization for treatment of UTI Knowledge Deficit Related to UTI and Atrial Fibrillation Patient having problems with urine stream.  Seem by urology 09/29/23. Proscar 5 mg ordered  Goal:  Over the next 30 days, the patient will not experience hospital readmission  Interventions:  Transitions of Care: Doctor Visits  - discussed the importance of doctor visits Reviewed Signs and symptoms of infection  AFIB Interventions:   Counseled on increased risk of stroke due to Afib and benefits of anticoagulation for stroke prevention Reviewed importance of adherence to anticoagulant exactly as prescribed       See physicians as scheduled       Monitor for chest pain, constant flutters in chest, shortness of breath, weakness, and feeling of passing out.         Monitor heart rate        Patient Self Care Activities:  Attend all scheduled provider appointments Call provider office for new concerns or questions  Notify RN Care Manager of TOC call rescheduling needs Participate in Transition of Care Program/Attend TOC scheduled calls Take medications as prescribed   make a plan to exercise regularly make a plan to eat healthy take medicine as prescribed    Plan:  The patient has been provided with contact information for the care management team and has been advised to call with any health related questions or concerns.         The patient verbalized understanding of instructions, educational materials, and care plan provided today and DECLINED offer to receive copy of  patient instructions, educational materials, and care plan.   The patient has been provided with contact information for the care management team and has been advised to call with any health related questions or concerns.   Please call the care guide team at [phone] if you need to cancel or reschedule your appointment.   Please call the Suicide and Crisis Lifeline: 988 call the [place]  National Suicide Prevention Lifeline: (434)704-6858 or TTY: 928-224-7698 TTY 615-039-4752) to talk to a trained counselor if you are experiencing a Mental Health or Behavioral Health Crisis or need someone to talk to.  Keldric Poyer J. Ely Ballen RN, MSN [facility], [facility] RN Care Manager Direct Dial: [phone]  Fax: [phone] Website: [email or web address]

## 2023-10-10 DIAGNOSIS — R351 Nocturia: Secondary | ICD-10-CM | POA: Diagnosis not present

## 2023-10-10 DIAGNOSIS — R3915 Urgency of urination: Secondary | ICD-10-CM | POA: Diagnosis not present

## 2023-10-12 ENCOUNTER — Other Ambulatory Visit: Payer: Self-pay

## 2023-10-12 NOTE — Patient Instructions (Signed)
 Visit Information  Thank you for taking time to visit with me today. Please don't hesitate to contact me if I can be of assistance to you.  Following is a copy of your care plan:   Goals Addressed             This Visit's Progress    COMPLETED: VBCI Transitions of Care (TOC) Care Plan       Problems:  Recent Hospitalization for treatment of UTI Knowledge Deficit Related to UTI and Atrial Fibrillation Patient having problems with urine stream.  Patient reports urine stream is better.  Continues to take Proscar.  Follow up in 2 weeks with urology.  Advised to contact physician for increased urgency, dribbling urine, fever, blood in the urine.    Goal:  Over the next 30 days, the patient will not experience hospital readmission  Interventions:  Transitions of Care: Doctor Visits  - discussed the importance of doctor visits Reviewed Signs and symptoms of infection  AFIB Interventions:   Counseled on increased risk of stroke due to Afib and benefits of anticoagulation for stroke prevention Reviewed importance of adherence to anticoagulant exactly as prescribed       See physicians as scheduled       Monitor for chest pain, constant flutters in chest, shortness of breath, weakness, and feeling of passing out.         Monitor heart rate        Patient Self Care Activities:  Attend all scheduled provider appointments Call provider office for new concerns or questions  Notify RN Care Manager of TOC call rescheduling needs Participate in Transition of Care Program/Attend TOC scheduled calls Take medications as prescribed   make a plan to exercise regularly make a plan to eat healthy take medicine as prescribed    Plan:  The patient has been provided with contact information for the care management team and has been advised to call with any health related questions or concerns.  Patient completed 30 day TOC program. Closing case. Declined CCM program        The patient  verbalized understanding of instructions, educational materials, and care plan provided today and DECLINED offer to receive copy of patient instructions, educational materials, and care plan.   The patient has been provided with contact information for the care management team and has been advised to call with any health related questions or concerns.   Please call the care guide team at [phone] if you need to cancel or reschedule your appointment.   Please call the Suicide and Crisis Lifeline: 988 call the [place]  National Suicide Prevention Lifeline: 854-534-9787 or TTY: 3016053967 TTY 870-029-9715) to talk to a trained counselor if you are experiencing a Mental Health or Behavioral Health Crisis or need someone to talk to.  Yared Susan J. Libi Corso RN, MSN [facility], [facility] RN Care Manager Direct Dial: [phone]  Fax: [phone] Website: [email or web address]

## 2023-10-12 NOTE — Transitions of Care (Post Inpatient/ED Visit) (Signed)
 Transition of Care Week 5  Visit Note  10/12/2023  Name: Kevin Goodman MRN: [id number]          DOB: [birth year]  Situation: Patient enrolled in [facility] 30-day program. Visit completed with patient by telephone.   Background:   Initial Transition Care Management Follow-up Telephone Call    Past Medical History:  Diagnosis Date   Atrial fibrillation (HCC)    a. initialy occurring in 2013  b. recurrence in 06/2020 in the setting of COVID-19 --> s/p DCCV in 07/2020   Chronic kidney disease, stage 3a (HCC)    Diabetes mellitus, type 2 (HCC)    Dysrhythmia    GERD (gastroesophageal reflux disease)    Gout    Hypertension    Mild pulmonary hypertension (HCC)    Morbid obesity (HCC)    OSA (obstructive sleep apnea)    Paroxysmal atrial flutter (HCC)    Sleep apnea     Assessment: Patient Reported Symptoms: Cognitive Cognitive Status: Alert and oriented to person, place, and time, Normal speech and language skills      Neurological Neurological Review of Symptoms: No symptoms reported    HEENT HEENT Symptoms Reported: No symptoms reported      Cardiovascular Cardiovascular Symptoms Reported: No symptoms reported    Respiratory Respiratory Symptoms Reported: No symptoms reported    Endocrine Endocrine Symptoms Reported: No symptoms reported Is patient diabetic?: Yes Is patient checking blood sugars at home?: Yes List most recent blood sugar readings, include date and time of day: Blood sugars reamin around 150 Endocrine Self-Management Outcome: 4 (good)  Gastrointestinal Gastrointestinal Symptoms Reported: No symptoms reported      Genitourinary Genitourinary Symptoms Reported: Difficulty initiating stream Additional Genitourinary Details: Taking proscar and seening uology Genitourinary Management Strategies: Medication therapy Genitourinary Self-Management Outcome: 4 (good)  Integumentary Integumentary Symptoms Reported: No symptoms reported    Musculoskeletal  Musculoskelatal Symptoms Reviewed: No symptoms reported        Psychosocial Psychosocial Symptoms Reported: No symptoms reported         There were no vitals filed for this visit.  Medications Reviewed Today     Reviewed by Cy Bresee, RN (Case Manager) on 10/12/23 at 1035  Med List Status: <None>   Medication Order Taking? Sig Documenting Provider Last Dose Status Informant  acetaminophen  (TYLENOL ) 500 MG tablet [id number] Yes Take 1,000 mg by mouth every 6 (six) hours as needed for moderate pain. [provider]  Active Self, Pharmacy Records  Allopurinol  200 MG TABS [id number] Yes Take 1 tablet by mouth daily. [provider]  Active Self, Pharmacy Records  ALPRAZolam  (XANAX ) 1 MG tablet [id number]  Take one tablet by mouth 1 hour prior to MRI  Patient not taking: Reported on 10/07/2023   Rogers Hai, MD  Active Self, Pharmacy Records  apixaban  (ELIQUIS ) 5 MG TABS tablet [id number] Yes Take 1 tablet (5 mg total) by mouth 2 (two) times daily. Vicci Afton LITTIE, MD  Active Self, Pharmacy Records           Med Note MAZIE, [facility]   Mon Jun 07, 2022 11:37 AM)    atorvastatin  (LIPITOR) 20 MG tablet [id number] Yes Take 20 mg by mouth daily. [provider]  Active Self, Pharmacy Records  B-D UF III MINI PEN NEEDLES 31G X 5 MM MISC [id number] Yes  [provider]  Active Self, Pharmacy Records  calcitRIOL (ROCALTROL) 0.25 MCG capsule [id number] Yes Take 0.25 mcg by mouth 2 (two) times a week. [provider]  Active Self, Pharmacy Records  diltiazem  (TIAZAC ) 360 MG 24 hr capsule [id number] Yes Take 1 capsule (360 mg total) by mouth daily. Ricky Fines, MD  Active Self, Pharmacy Records  ferrous sulfate  325 [phone] FE) MG EC tablet [id number] Yes Take 325 mg by mouth daily. [provider]  Active Self, Pharmacy Records  finasteride (PROSCAR) 5 MG tablet [id number] Yes Take 5 mg by mouth daily. [provider]  Active    furosemide  (LASIX ) 40 MG tablet [id number] Yes Take 40 mg by mouth 2 (two) times daily. [provider]  Active Self, Pharmacy Records  hydrALAZINE  (APRESOLINE ) 100 MG tablet [id number] Yes Take 100 mg by mouth 2 (two) times daily. [provider]  Active Self, Pharmacy Records  LANTUS  SOLOSTAR 100 UNIT/ML Solostar Pen [id number] Yes Inject 40 Units into the skin at bedtime. [provider]  Active Self, Pharmacy Records  losartan  (COZAAR ) 100 MG tablet [id number] Yes Take 1 tablet (100 mg total) by mouth daily. Johnson Laymon HERO, PA-C  Active Self, Pharmacy Records  metoprolol  tartrate (LOPRESSOR ) 50 MG tablet [id number] Yes Take 1 tablet (50 mg total) by mouth 2 (two) times daily. Ricky Fines, MD  Active Self, Pharmacy Records  pantoprazole  (PROTONIX ) 40 MG tablet [id number] Yes Take 1 tablet (40 mg total) by mouth 2 (two) times daily before a meal. 30 minutes before breakfast Cindie Carlin POUR, DO  Active Self, Pharmacy Records            Goals Addressed             This Visit's Progress    COMPLETED: VBCI Transitions of Care (TOC) Care Plan       Problems:  Recent Hospitalization for treatment of UTI Knowledge Deficit Related to UTI and Atrial Fibrillation Patient having problems with urine stream.  Patient reports urine stream is better.  Continues to take Proscar.  Follow up in 2 weeks with urology.  Advised to contact physician for increased urgency, dribbling urine, fever, blood in the urine.    Goal:  Over the next 30 days, the patient will not experience hospital readmission  Interventions:  Transitions of Care: Doctor Visits  - discussed the importance of doctor visits Reviewed Signs and symptoms of infection  AFIB Interventions:   Counseled on increased risk of stroke due to Afib and benefits of anticoagulation for stroke prevention Reviewed importance of adherence to anticoagulant exactly as prescribed       See physicians as scheduled        Monitor for chest pain, constant flutters in chest, shortness of breath, weakness, and feeling of passing out.         Monitor heart rate        Patient Self Care Activities:  Attend all scheduled provider appointments Call provider office for new concerns or questions  Notify RN Care Manager of TOC call rescheduling needs Participate in Transition of Care Program/Attend TOC scheduled calls Take medications as prescribed   make a plan to exercise regularly make a plan to eat healthy take medicine as prescribed    Plan:  The patient has been provided with contact information for the care management team and has been advised to call with any health related questions or concerns.  Patient completed 30 day TOC program. Closing case. Declined CCM program         Recommendation:   Follow up with providers as scheduled  Follow Up Plan:   Closing From:  Transitions of Care Program  Aniyiah Zell J. Dajai Wahlert RN, MSN [facility], [facility] RN Care Manager Direct Dial: [phone]  Fax: [phone] Website: [email or web address]

## 2023-10-13 ENCOUNTER — Encounter (HOSPITAL_COMMUNITY): Payer: Self-pay

## 2023-10-13 ENCOUNTER — Other Ambulatory Visit: Payer: Self-pay

## 2023-10-13 ENCOUNTER — Emergency Department (HOSPITAL_COMMUNITY)
Admission: EM | Admit: 2023-10-13 | Discharge: 2023-10-13 | Disposition: A | Discharge status: Home / Self Care | Attending: Emergency Medicine | Admitting: Emergency Medicine

## 2023-10-13 DIAGNOSIS — Z794 Long term (current) use of insulin: Secondary | ICD-10-CM | POA: Diagnosis not present

## 2023-10-13 DIAGNOSIS — R300 Dysuria: Secondary | ICD-10-CM | POA: Diagnosis present

## 2023-10-13 DIAGNOSIS — Z7901 Long term (current) use of anticoagulants: Secondary | ICD-10-CM | POA: Insufficient documentation

## 2023-10-13 DIAGNOSIS — E119 Type 2 diabetes mellitus without complications: Secondary | ICD-10-CM | POA: Insufficient documentation

## 2023-10-13 DIAGNOSIS — N3000 Acute cystitis without hematuria: Secondary | ICD-10-CM | POA: Diagnosis not present

## 2023-10-13 LAB — COMPREHENSIVE METABOLIC PANEL WITH GFR
ALT: 16 U/L (ref 0–44)
AST: 20 U/L (ref 15–41)
Albumin: 3.2 g/dL — ABNORMAL LOW (ref 3.5–5.0)
Alkaline Phosphatase: 59 U/L (ref 38–126)
Anion gap: 12 (ref 5–15)
BUN: 30 mg/dL — ABNORMAL HIGH (ref 8–23)
CO2: 22 mmol/L (ref 22–32)
Calcium: 9 mg/dL (ref 8.9–10.3)
Chloride: 103 mmol/L (ref 98–111)
Creatinine, Ser: 1.5 mg/dL — ABNORMAL HIGH (ref 0.61–1.24)
GFR, Estimated: 49 mL/min — ABNORMAL LOW (ref >60)
Glucose, Bld: 218 mg/dL — ABNORMAL HIGH (ref 70–99)
Potassium: 4.3 mmol/L (ref 3.5–5.1)
Sodium: 137 mmol/L (ref 135–145)
Total Bilirubin: 1 mg/dL (ref 0.0–1.2)
Total Protein: 7 g/dL (ref 6.5–8.1)

## 2023-10-13 LAB — CBC WITH DIFFERENTIAL/PLATELET
Abs Immature Granulocytes: 0.08 K/uL — ABNORMAL HIGH (ref 0.00–0.07)
Basophils Absolute: 0.1 K/uL (ref 0.0–0.1)
Basophils Relative: 1 %
Eosinophils Absolute: 0.2 K/uL (ref 0.0–0.5)
Eosinophils Relative: 1 %
HCT: 41.3 % (ref 39.0–52.0)
Hemoglobin: 13.7 g/dL (ref 13.0–17.0)
Immature Granulocytes: 1 %
Lymphocytes Relative: 21 %
Lymphs Abs: 3.5 K/uL (ref 0.7–4.0)
MCH: 31.5 pg (ref 26.0–34.0)
MCHC: 33.2 g/dL (ref 30.0–36.0)
MCV: 94.9 fL (ref 80.0–100.0)
Monocytes Absolute: 0.9 K/uL (ref 0.1–1.0)
Monocytes Relative: 6 %
Neutro Abs: 12 K/uL — ABNORMAL HIGH (ref 1.7–7.7)
Neutrophils Relative %: 70 %
Platelets: 381 K/uL (ref 150–400)
RBC: 4.35 MIL/uL (ref 4.22–5.81)
RDW: 15.2 % (ref 11.5–15.5)
WBC: 16.8 K/uL — ABNORMAL HIGH (ref 4.0–10.5)
nRBC: 0 % (ref 0.0–0.2)

## 2023-10-13 LAB — URINALYSIS, ROUTINE W REFLEX MICROSCOPIC
Bacteria, UA: NONE SEEN
Bilirubin Urine: NEGATIVE
Glucose, UA: NEGATIVE mg/dL
Hgb urine dipstick: NEGATIVE
Ketones, ur: NEGATIVE mg/dL
Nitrite: NEGATIVE
Protein, ur: 30 mg/dL — AB
Specific Gravity, Urine: 1.018 (ref 1.005–1.030)
pH: 6 (ref 5.0–8.0)

## 2023-10-13 MED ORDER — CEFDINIR 300 MG PO CAPS: 300.0000 mg | ORAL_CAPSULE | Freq: Two times a day (BID) | ORAL | 0 refills | Status: AC

## 2023-10-13 MED ORDER — SODIUM CHLORIDE 0.9 % IV SOLN
1.0000 g | Freq: Once | INTRAVENOUS | Status: AC
  Administered 2023-10-13: 1 g via INTRAVENOUS
  Filled 2023-10-13: qty 10

## 2023-10-13 NOTE — ED Notes (Signed)
 Patient tolerated 8 oz of water , able to void 60 ml of yellow color urine with small amount of sediment present in urine.

## 2023-10-13 NOTE — Discharge Instructions (Addendum)
 It appears that you probably do have a bladder infection, we have started you with some antibiotics and I have given you a prescription for the next 7 days.  Take the cefdinir  twice a day for 7 days, see your doctor in 3 days for recheck, ER for worsening symptoms

## 2023-10-13 NOTE — ED Triage Notes (Signed)
 Pt arrived via POV c/o recurrent urinary retention. Pt reports similar symptoms last month when he was admitted to the hospital.

## 2023-10-13 NOTE — ED Provider Notes (Signed)
 [facility] Goodman Provider Note   CSN: [id number] Arrival date & time: 10/13/23  1728     Patient presents with: Urinary Retention   Kevin Goodman is a 73 y.o. male.   HPI   This patient is a 73 year old male, morbidly obese, presents with a complaint of burning with urination, he is on Eliquis , furosemide , diabetic on insulin  and is on metoprolol .  The patient was admitted to the hospital with a urinary tract infection about a month ago.  He was also in A-fib with RVR at that time, he spent approximately 3 days in the hospital, he was discharged with 4 more days of cephalexin  at discharge.  Because of recurrent UTIs his Farxiga  was discontinued.  Urinary culture at that time showed less than 10,000 colonies growth  He presents stating that he is having dysuria, no fevers chills or abdominal pain.  He urinated right before he got here show there is nothing in his bladder at this time  Prior to Admission medications   Medication Sig Start Date End Date Taking? Authorizing Provider  cefdinir  (OMNICEF ) 300 MG capsule Take 1 capsule (300 mg total) by mouth 2 (two) times daily for 7 days. 10/13/23 10/20/23 Yes Cleotilde Rogue, MD  acetaminophen  (TYLENOL ) 500 MG tablet Take 1,000 mg by mouth every 6 (six) hours as needed for moderate pain.    [provider]  Allopurinol  200 MG TABS Take 1 tablet by mouth daily. 07/01/23   [provider]  ALPRAZolam  (XANAX ) 1 MG tablet Take one tablet by mouth 1 hour prior to MRI Patient not taking: Reported on 10/07/2023 04/13/23   Rogers Hai, MD  apixaban  (ELIQUIS ) 5 MG TABS tablet Take 1 tablet (5 mg total) by mouth 2 (two) times daily. 06/19/21   Johnson, Clanford L, MD  atorvastatin  (LIPITOR) 20 MG tablet Take 20 mg by mouth daily.    [provider]  B-D UF III MINI PEN NEEDLES 31G X 5 MM MISC  01/31/23   [provider]  calcitRIOL (ROCALTROL) 0.25 MCG capsule Take 0.25 mcg  by mouth 2 (two) times a week. 03/30/23   [provider]  cephALEXin  (KEFLEX ) 500 MG capsule Take 500 mg by mouth 3 (three) times daily. 10/10/23   [provider]  diltiazem  (TIAZAC ) 360 MG 24 hr capsule Take 1 capsule (360 mg total) by mouth daily. 02/12/23   Ricky Fines, MD  ferrous sulfate  325 (65 FE) MG EC tablet Take 325 mg by mouth daily. 04/30/22   [provider]  finasteride (PROSCAR) 5 MG tablet Take 5 mg by mouth daily.    [provider]  furosemide  (LASIX ) 40 MG tablet Take 40 mg by mouth 2 (two) times daily.    [provider]  hydrALAZINE  (APRESOLINE ) 100 MG tablet Take 100 mg by mouth 2 (two) times daily.    [provider]  LANTUS  SOLOSTAR 100 UNIT/ML Solostar Pen Inject 40 Units into the skin at bedtime. 05/18/22   [provider]  losartan  (COZAAR ) 100 MG tablet Take 1 tablet (100 mg total) by mouth daily. 10/27/20   Strader, Brittany M, PA-C  metoprolol  tartrate (LOPRESSOR ) 50 MG tablet Take 1 tablet (50 mg total) by mouth 2 (two) times daily. 02/12/23   Ricky Fines, MD  pantoprazole  (PROTONIX ) 40 MG tablet Take 1 tablet (40 mg total) by mouth 2 (two) times daily before a meal. 30 minutes before breakfast 12/25/20   Cindie Carlin POUR, DO  Allergies: Patient has no known allergies.    Review of Systems  All other systems reviewed and are negative.   Updated Vital Signs BP 116/89 (BP Location: Right Arm)   Pulse 100   Temp 98.4 F (36.9 C) (Tympanic)   Resp 20   Ht 1.854 m (6' 1)   Wt (!) 180.5 kg   SpO2 95%   BMI 52.50 kg/m   Physical Exam Vitals and nursing note reviewed.  Constitutional:      General: He is not in acute distress.    Appearance: He is well-developed.  HENT:     Head: Normocephalic and atraumatic.     Mouth/Throat:     Pharynx: No oropharyngeal exudate.  Eyes:     General: No scleral icterus.       Right eye: No discharge.        Left eye: No discharge.      Conjunctiva/sclera: Conjunctivae normal.     Pupils: Pupils are equal, round, and reactive to light.  Neck:     Thyroid : No thyromegaly.     Vascular: No JVD.  Cardiovascular:     Rate and Rhythm: Normal rate and regular rhythm.     Heart sounds: Normal heart sounds. No murmur heard.    No friction rub. No gallop.  Pulmonary:     Effort: Pulmonary effort is normal. No respiratory distress.     Breath sounds: Normal breath sounds. No wheezing or rales.  Abdominal:     General: Bowel sounds are normal. There is no distension.     Palpations: Abdomen is soft. There is no mass.     Tenderness: There is no abdominal tenderness.  Genitourinary:    Comments: Patient is uncircumcised, no tenderness in the groin or inguinal region, no signs of rash to the scrotum Musculoskeletal:        General: No tenderness. Normal range of motion.     Cervical back: Normal range of motion and neck supple.     Right lower leg: No edema.     Left lower leg: No edema.  Lymphadenopathy:     Cervical: No cervical adenopathy.  Skin:    General: Skin is warm and dry.     Findings: No erythema or rash.  Neurological:     Mental Status: He is alert.     Coordination: Coordination normal.  Psychiatric:        Behavior: Behavior normal.     (all labs ordered are listed, but only abnormal results are displayed) Labs Reviewed  CBC WITH DIFFERENTIAL/PLATELET - Abnormal; Notable for the following components:      Result Value   WBC 16.8 (*)    Neutro Abs 12.0 (*)    Abs Immature Granulocytes 0.08 (*)    All other components within normal limits  COMPREHENSIVE METABOLIC PANEL WITH GFR - Abnormal; Notable for the following components:   Glucose, Bld 218 (*)    BUN 30 (*)    Creatinine, Ser 1.50 (*)    Albumin 3.2 (*)    GFR, Estimated 49 (*)    All other components within normal limits  URINALYSIS, ROUTINE W REFLEX MICROSCOPIC - Abnormal; Notable for the following components:   Protein, ur 30 (*)     Leukocytes,Ua MODERATE (*)    All other components within normal limits  URINE CULTURE  GC/CHLAMYDIA PROBE AMP ([place]) NOT AT [facility]    EKG: None  Radiology: No results found.   Procedures   Medications Ordered  in the ED  cefTRIAXone  (ROCEPHIN ) 1 g in sodium chloride  0.9 % 100 mL IVPB (0 g Intravenous Stopped 10/13/23 2016)                                    Medical Decision Making Amount and/or Complexity of Data Reviewed Labs: ordered.  Risk Prescription drug management.   No distress, vitals unremarkable, no fever or tachycardia or hypotension, urine with culture ordered.  Labs:  increaesd WBC UA - + UTI  GC sent off incase Culture sent for UA  Rocep[hin given  Pt amenable to d/c and agreeable to the plan, cefdinir  sent to the pharmacy     Final diagnoses:  Acute cystitis without hematuria    ED Discharge Orders          Ordered    cefdinir  (OMNICEF ) 300 MG capsule  2 times daily        10/13/23 2137               Cleotilde Rogue, MD 10/13/23 2139

## 2023-10-14 LAB — URINE CULTURE: Culture: <10000 — AB

## 2023-10-17 DIAGNOSIS — E1165 Type 2 diabetes mellitus with hyperglycemia: Secondary | ICD-10-CM | POA: Diagnosis not present

## 2023-10-17 DIAGNOSIS — I10 Essential (primary) hypertension: Secondary | ICD-10-CM | POA: Diagnosis not present

## 2023-10-17 LAB — GC/CHLAMYDIA PROBE AMP (~~LOC~~) NOT AT ARMC
Chlamydia: NEGATIVE
Comment: NEGATIVE
Comment: NORMAL
Neisseria Gonorrhea: NEGATIVE

## 2023-10-26 ENCOUNTER — Inpatient Hospital Stay: Payer: Medicare PPO | Attending: Oncology

## 2023-10-26 ENCOUNTER — Ambulatory Visit (HOSPITAL_COMMUNITY)
Admission: RE | Admit: 2023-10-26 | Discharge: 2023-10-26 | Disposition: A | Discharge status: Home / Self Care | Source: Ambulatory Visit | Attending: Hematology | Admitting: Hematology

## 2023-10-26 DIAGNOSIS — R339 Retention of urine, unspecified: Secondary | ICD-10-CM | POA: Insufficient documentation

## 2023-10-26 DIAGNOSIS — R202 Paresthesia of skin: Secondary | ICD-10-CM | POA: Insufficient documentation

## 2023-10-26 DIAGNOSIS — E669 Obesity, unspecified: Secondary | ICD-10-CM | POA: Diagnosis not present

## 2023-10-26 DIAGNOSIS — D472 Monoclonal gammopathy: Secondary | ICD-10-CM | POA: Diagnosis not present

## 2023-10-26 DIAGNOSIS — R0602 Shortness of breath: Secondary | ICD-10-CM | POA: Insufficient documentation

## 2023-10-26 DIAGNOSIS — G4700 Insomnia, unspecified: Secondary | ICD-10-CM | POA: Diagnosis not present

## 2023-10-26 DIAGNOSIS — M899 Disorder of bone, unspecified: Secondary | ICD-10-CM | POA: Insufficient documentation

## 2023-10-26 DIAGNOSIS — K5900 Constipation, unspecified: Secondary | ICD-10-CM | POA: Insufficient documentation

## 2023-10-26 DIAGNOSIS — Z79899 Other long term (current) drug therapy: Secondary | ICD-10-CM | POA: Diagnosis not present

## 2023-10-26 DIAGNOSIS — R200 Anesthesia of skin: Secondary | ICD-10-CM | POA: Insufficient documentation

## 2023-10-26 DIAGNOSIS — M47815 Spondylosis without myelopathy or radiculopathy, thoracolumbar region: Secondary | ICD-10-CM | POA: Diagnosis not present

## 2023-10-26 LAB — CBC WITH DIFFERENTIAL/PLATELET
Abs Immature Granulocytes: 0.03 K/uL (ref 0.00–0.07)
Basophils Absolute: 0.1 K/uL (ref 0.0–0.1)
Basophils Relative: 1 %
Eosinophils Absolute: 0.3 K/uL (ref 0.0–0.5)
Eosinophils Relative: 2 %
HCT: 43.4 % (ref 39.0–52.0)
Hemoglobin: 13.9 g/dL (ref 13.0–17.0)
Immature Granulocytes: 0 %
Lymphocytes Relative: 44 %
Lymphs Abs: 5.5 K/uL — ABNORMAL HIGH (ref 0.7–4.0)
MCH: 30.9 pg (ref 26.0–34.0)
MCHC: 32 g/dL (ref 30.0–36.0)
MCV: 96.4 fL (ref 80.0–100.0)
Monocytes Absolute: 0.7 K/uL (ref 0.1–1.0)
Monocytes Relative: 6 %
Neutro Abs: 5.8 K/uL (ref 1.7–7.7)
Neutrophils Relative %: 47 %
Platelets: 292 K/uL (ref 150–400)
RBC: 4.5 MIL/uL (ref 4.22–5.81)
RDW: 16.3 % — ABNORMAL HIGH (ref 11.5–15.5)
Smear Review: NORMAL
WBC: 12.4 K/uL — ABNORMAL HIGH (ref 4.0–10.5)
nRBC: 0 % (ref 0.0–0.2)

## 2023-10-26 LAB — COMPREHENSIVE METABOLIC PANEL WITH GFR
ALT: 32 U/L (ref 0–44)
AST: 20 U/L (ref 15–41)
Albumin: 3.6 g/dL (ref 3.5–5.0)
Alkaline Phosphatase: 74 U/L (ref 38–126)
Anion gap: 12 (ref 5–15)
BUN: 22 mg/dL (ref 8–23)
CO2: 23 mmol/L (ref 22–32)
Calcium: 9.4 mg/dL (ref 8.9–10.3)
Chloride: 105 mmol/L (ref 98–111)
Creatinine, Ser: 1.28 mg/dL — ABNORMAL HIGH (ref 0.61–1.24)
GFR, Estimated: 60 mL/min — ABNORMAL LOW (ref >60)
Glucose, Bld: 146 mg/dL — ABNORMAL HIGH (ref 70–99)
Potassium: 4.3 mmol/L (ref 3.5–5.1)
Sodium: 140 mmol/L (ref 135–145)
Total Bilirubin: 0.6 mg/dL (ref 0.0–1.2)
Total Protein: 7 g/dL (ref 6.5–8.1)

## 2023-10-27 LAB — KAPPA/LAMBDA LIGHT CHAINS
Kappa free light chain: 37.8 mg/L — ABNORMAL HIGH (ref 3.3–19.4)
Kappa, lambda light chain ratio: 2.33 — ABNORMAL HIGH (ref 0.26–1.65)
Lambda free light chains: 16.2 mg/L (ref 5.7–26.3)

## 2023-10-28 DIAGNOSIS — R39198 Other difficulties with micturition: Secondary | ICD-10-CM | POA: Diagnosis not present

## 2023-11-02 ENCOUNTER — Inpatient Hospital Stay (HOSPITAL_BASED_OUTPATIENT_CLINIC_OR_DEPARTMENT_OTHER): Payer: Medicare PPO | Admitting: Oncology

## 2023-11-02 VITALS — BP 139/66 | HR 52 | Temp 97.9°F | Resp 16 | Wt 395.0 lb

## 2023-11-02 DIAGNOSIS — G4700 Insomnia, unspecified: Secondary | ICD-10-CM | POA: Diagnosis not present

## 2023-11-02 DIAGNOSIS — D472 Monoclonal gammopathy: Secondary | ICD-10-CM

## 2023-11-02 DIAGNOSIS — R202 Paresthesia of skin: Secondary | ICD-10-CM | POA: Diagnosis not present

## 2023-11-02 DIAGNOSIS — E611 Iron deficiency: Secondary | ICD-10-CM | POA: Insufficient documentation

## 2023-11-02 DIAGNOSIS — R339 Retention of urine, unspecified: Secondary | ICD-10-CM | POA: Diagnosis not present

## 2023-11-02 DIAGNOSIS — R0602 Shortness of breath: Secondary | ICD-10-CM | POA: Diagnosis not present

## 2023-11-02 DIAGNOSIS — M899 Disorder of bone, unspecified: Secondary | ICD-10-CM | POA: Diagnosis not present

## 2023-11-02 DIAGNOSIS — R200 Anesthesia of skin: Secondary | ICD-10-CM | POA: Diagnosis not present

## 2023-11-02 DIAGNOSIS — K5900 Constipation, unspecified: Secondary | ICD-10-CM | POA: Diagnosis not present

## 2023-11-02 DIAGNOSIS — E669 Obesity, unspecified: Secondary | ICD-10-CM | POA: Diagnosis not present

## 2023-11-02 DIAGNOSIS — M898X9 Other specified disorders of bone, unspecified site: Secondary | ICD-10-CM | POA: Diagnosis not present

## 2023-11-02 MED ORDER — FERROUS GLUCONATE 324 (38 FE) MG PO TABS: 324.0000 mg | ORAL_TABLET | Freq: Every day | ORAL | 0 refills | Status: DC

## 2023-11-02 NOTE — Progress Notes (Signed)
 [facility] OFFICE PROGRESS NOTE  Fanta, Benita Area, MD  ASSESSMENT & PLAN:    Assessment & Plan IgG Kappa MGUS - Myeloma labs on 10/26/2023 shows more or less stable myeloma labs.  No crab criteria.  He has had a chronically elevated kappa lambda light chain ratio and it is actually improved from today. - Thoracic x-ray showed increased conspicuity of possible 1 cm lucent lesion in the T12 body. - We did MRI of the thoracic spine on 04/22/2023: Negative for bone lesion. - Recommend follow-up in 6 months with repeat myeloma labs.  Will also repeat skeletal survey at that time. Low iron - Continue iron tablet 3 times weekly.  Reports he has not consistently been taking iron as he ran out.  He would like prescription sent to his pharmacy.  New Rx sent. -We will recheck his iron studies at his next visit.   Lytic lesion of bone on x-ray Thoracic x-ray showed increased possible 1 cm lucent lesion in the T12 body.  MRI of thoracic spine on 04/22/2023 was negative. No new bone pain.  Orders Placed This Encounter  Procedures   Protein electrophoresis, serum    Standing Status:   Future    Expected Date:   04/25/2024    Expiration Date:   11/01/2024   Kappa/lambda light chains    Standing Status:   Future    Expected Date:   04/25/2024    Expiration Date:   11/01/2024   Comprehensive metabolic panel    Standing Status:   Future    Expected Date:   04/25/2024    Expiration Date:   11/01/2024   CBC with Differential    Standing Status:   Future    Expected Date:   04/25/2024    Expiration Date:   11/01/2024   Ferritin    Standing Status:   Future    Expected Date:   05/04/2024    Expiration Date:   08/02/2024   Iron and TIBC ([facility] ONLY)    Standing Status:   Future    Expected Date:   05/04/2024    Expiration Date:   08/02/2024   Immunofixation electrophoresis    Standing Status:   Future    Expected Date:   05/04/2024    Expiration Date:    08/02/2024   Beta 2 microglobuline, serum    Standing Status:   Future    Expected Date:   05/04/2024    Expiration Date:   08/02/2024   INTERVAL HISTORY: Patient returns for follow-up.  Patient reports has been doing well since last visit.  He was evaluated on several occasions in the emergency room for urinary retention and possible UTIs.  Most recently he was treated with cefdinir .  He was instructed to stop taking Farxiga  given he continued getting UTIs.  Reports an energy level of 50% and appetite of 100%.  Reports he has chronic shortness of breath due to obesity.  Has occasional constipation.  Has numbness and tingling in his hands and feet and has insomnia.  Reports overall feeling stable.  We reviewed myeloma labs, CBC, CMP.  SUMMARY OF HEMATOLOGIC HISTORY: Oncology History   No history exists.     CBC    Component Value Date/Time   WBC 12.4 (H) 10/26/2023 1110   RBC 4.50 10/26/2023 1110   HGB 13.9 10/26/2023 1110   HCT 43.4 10/26/2023 1110   PLT 292 10/26/2023 1110   MCV 96.4 10/26/2023 1110   MCH  30.9 10/26/2023 1110   MCHC 32.0 10/26/2023 1110   RDW 16.3 (H) 10/26/2023 1110   LYMPHSABS 5.5 (H) 10/26/2023 1110   MONOABS 0.7 10/26/2023 1110   EOSABS 0.3 10/26/2023 1110   BASOSABS 0.1 10/26/2023 1110       Latest Ref Rng & Units 10/26/2023   11:10 AM 10/13/2023    5:49 PM 09/09/2023    4:35 AM  CMP  Glucose 70 - 99 mg/dL 853  781  898   BUN 8 - 23 mg/dL 22  30  18    Creatinine 0.61 - 1.24 mg/dL 8.71  8.49  8.54   Sodium 135 - 145 mmol/L 140  137  136   Potassium 3.5 - 5.1 mmol/L 4.3  4.3  4.4   Chloride 98 - 111 mmol/L 105  103  104   CO2 22 - 32 mmol/L 23  22  23    Calcium  8.9 - 10.3 mg/dL 9.4  9.0  8.9   Total Protein 6.5 - 8.1 g/dL 7.0  7.0    Total Bilirubin 0.0 - 1.2 mg/dL 0.6  1.0    Alkaline Phos 38 - 126 U/L 74  59    AST 15 - 41 U/L 20  20    ALT 0 - 44 U/L 32  16       Lab Results  Component Value Date   FERRITIN 35 03/24/2023    Vitals:    11/02/23 1140  BP: 139/66  Pulse: (!) 52  Resp: 16  Temp: 97.9 F (36.6 C)  SpO2: 95%    Review of System:  Review of Systems  Respiratory:  Positive for sputum production.   Gastrointestinal:  Positive for constipation.  Neurological:  Positive for tingling.  Psychiatric/Behavioral:  The patient has insomnia.     Physical Exam: Physical Exam Constitutional:      Appearance: Normal appearance.  HENT:     Head: Normocephalic and atraumatic.  Eyes:     Pupils: Pupils are equal, round, and reactive to light.  Cardiovascular:     Rate and Rhythm: Normal rate and regular rhythm.     Heart sounds: Normal heart sounds. No murmur heard. Pulmonary:     Effort: Pulmonary effort is normal.     Breath sounds: Normal breath sounds. No wheezing.  Abdominal:     General: Bowel sounds are normal. There is no distension.     Palpations: Abdomen is soft.     Tenderness: There is no abdominal tenderness.  Musculoskeletal:        General: Normal range of motion.     Cervical back: Normal range of motion.  Skin:    General: Skin is warm and dry.     Findings: No rash.  Neurological:     Mental Status: He is alert and oriented to person, place, and time.     Gait: Gait is intact.  Psychiatric:        Mood and Affect: Mood and affect normal.        Cognition and Memory: Memory normal.        Judgment: Judgment normal.      I spent 20 minutes dedicated to the care of this patient (face-to-face and non-face-to-face) on the date of the encounter to include what is described in the assessment and plan.,  Delon Hope, NP 11/02/2023 1:08 PM

## 2023-11-02 NOTE — Assessment & Plan Note (Addendum)
-   Myeloma labs on 10/26/2023 shows more or less stable myeloma labs.  No crab criteria.  He has had a chronically elevated kappa lambda light chain ratio and it is actually improved from today. - Thoracic x-ray showed increased conspicuity of possible 1 cm lucent lesion in the T12 body. - We did MRI of the thoracic spine on 04/22/2023: Negative for bone lesion. - Recommend follow-up in 6 months with repeat myeloma labs.  Will also repeat skeletal survey at that time.

## 2023-11-02 NOTE — Assessment & Plan Note (Addendum)
-   Continue iron tablet 3 times weekly.  Reports he has not consistently been taking iron as he ran out.  He would like prescription sent to his pharmacy.  New Rx sent. -We will recheck his iron studies at his next visit.

## 2023-11-17 DIAGNOSIS — I10 Essential (primary) hypertension: Secondary | ICD-10-CM | POA: Diagnosis not present

## 2023-11-17 DIAGNOSIS — E1165 Type 2 diabetes mellitus with hyperglycemia: Secondary | ICD-10-CM | POA: Diagnosis not present

## 2023-12-01 DIAGNOSIS — N399 Disorder of urinary system, unspecified: Secondary | ICD-10-CM | POA: Diagnosis not present

## 2023-12-01 DIAGNOSIS — D631 Anemia in chronic kidney disease: Secondary | ICD-10-CM | POA: Diagnosis not present

## 2023-12-01 DIAGNOSIS — R809 Proteinuria, unspecified: Secondary | ICD-10-CM | POA: Diagnosis not present

## 2023-12-01 DIAGNOSIS — I10 Essential (primary) hypertension: Secondary | ICD-10-CM | POA: Diagnosis not present

## 2023-12-01 DIAGNOSIS — N189 Chronic kidney disease, unspecified: Secondary | ICD-10-CM | POA: Diagnosis not present

## 2023-12-28 DIAGNOSIS — R351 Nocturia: Secondary | ICD-10-CM | POA: Diagnosis not present

## 2023-12-28 DIAGNOSIS — N5201 Erectile dysfunction due to arterial insufficiency: Secondary | ICD-10-CM | POA: Diagnosis not present

## 2024-01-05 DIAGNOSIS — I10 Essential (primary) hypertension: Secondary | ICD-10-CM | POA: Diagnosis not present

## 2024-01-05 DIAGNOSIS — E1165 Type 2 diabetes mellitus with hyperglycemia: Secondary | ICD-10-CM | POA: Diagnosis not present

## 2024-01-05 DIAGNOSIS — N1832 Chronic kidney disease, stage 3b: Secondary | ICD-10-CM | POA: Diagnosis not present

## 2024-01-05 DIAGNOSIS — I480 Paroxysmal atrial fibrillation: Secondary | ICD-10-CM | POA: Diagnosis not present

## 2024-01-05 DIAGNOSIS — Z23 Encounter for immunization: Secondary | ICD-10-CM | POA: Diagnosis not present

## 2024-01-27 ENCOUNTER — Other Ambulatory Visit: Payer: Self-pay

## 2024-01-27 ENCOUNTER — Emergency Department (HOSPITAL_COMMUNITY)

## 2024-01-27 ENCOUNTER — Observation Stay (HOSPITAL_COMMUNITY)
Admission: EM | Admit: 2024-01-27 | Discharge: 2024-01-30 | Disposition: A | Discharge status: Home / Self Care | Attending: Emergency Medicine | Admitting: Emergency Medicine

## 2024-01-27 ENCOUNTER — Encounter (HOSPITAL_COMMUNITY): Payer: Self-pay

## 2024-01-27 ENCOUNTER — Observation Stay (HOSPITAL_COMMUNITY)

## 2024-01-27 DIAGNOSIS — I6522 Occlusion and stenosis of left carotid artery: Secondary | ICD-10-CM

## 2024-01-27 DIAGNOSIS — I639 Cerebral infarction, unspecified: Principal | ICD-10-CM | POA: Diagnosis present

## 2024-01-27 DIAGNOSIS — I6389 Other cerebral infarction: Principal | ICD-10-CM | POA: Insufficient documentation

## 2024-01-27 DIAGNOSIS — I129 Hypertensive chronic kidney disease with stage 1 through stage 4 chronic kidney disease, or unspecified chronic kidney disease: Secondary | ICD-10-CM | POA: Diagnosis not present

## 2024-01-27 DIAGNOSIS — F17210 Nicotine dependence, cigarettes, uncomplicated: Secondary | ICD-10-CM | POA: Diagnosis not present

## 2024-01-27 DIAGNOSIS — I480 Paroxysmal atrial fibrillation: Secondary | ICD-10-CM | POA: Insufficient documentation

## 2024-01-27 DIAGNOSIS — E66813 Obesity, class 3: Secondary | ICD-10-CM | POA: Diagnosis not present

## 2024-01-27 DIAGNOSIS — K219 Gastro-esophageal reflux disease without esophagitis: Secondary | ICD-10-CM | POA: Diagnosis present

## 2024-01-27 DIAGNOSIS — I6523 Occlusion and stenosis of bilateral carotid arteries: Secondary | ICD-10-CM | POA: Diagnosis not present

## 2024-01-27 DIAGNOSIS — M109 Gout, unspecified: Secondary | ICD-10-CM | POA: Diagnosis not present

## 2024-01-27 DIAGNOSIS — I6782 Cerebral ischemia: Secondary | ICD-10-CM | POA: Diagnosis not present

## 2024-01-27 DIAGNOSIS — R29818 Other symptoms and signs involving the nervous system: Secondary | ICD-10-CM | POA: Diagnosis not present

## 2024-01-27 DIAGNOSIS — Z79899 Other long term (current) drug therapy: Secondary | ICD-10-CM | POA: Insufficient documentation

## 2024-01-27 DIAGNOSIS — I10 Essential (primary) hypertension: Secondary | ICD-10-CM | POA: Diagnosis present

## 2024-01-27 DIAGNOSIS — Z87891 Personal history of nicotine dependence: Secondary | ICD-10-CM | POA: Diagnosis not present

## 2024-01-27 DIAGNOSIS — I4892 Unspecified atrial flutter: Secondary | ICD-10-CM | POA: Diagnosis present

## 2024-01-27 DIAGNOSIS — R0989 Other specified symptoms and signs involving the circulatory and respiratory systems: Secondary | ICD-10-CM | POA: Diagnosis not present

## 2024-01-27 DIAGNOSIS — E1122 Type 2 diabetes mellitus with diabetic chronic kidney disease: Secondary | ICD-10-CM | POA: Diagnosis not present

## 2024-01-27 DIAGNOSIS — Z6841 Body Mass Index (BMI) 40.0 and over, adult: Secondary | ICD-10-CM | POA: Diagnosis not present

## 2024-01-27 DIAGNOSIS — Z794 Long term (current) use of insulin: Secondary | ICD-10-CM

## 2024-01-27 DIAGNOSIS — G8192 Hemiplegia, unspecified affecting left dominant side: Secondary | ICD-10-CM | POA: Diagnosis present

## 2024-01-27 DIAGNOSIS — N1831 Chronic kidney disease, stage 3a: Secondary | ICD-10-CM | POA: Diagnosis not present

## 2024-01-27 DIAGNOSIS — G4733 Obstructive sleep apnea (adult) (pediatric): Secondary | ICD-10-CM | POA: Diagnosis not present

## 2024-01-27 DIAGNOSIS — E119 Type 2 diabetes mellitus without complications: Secondary | ICD-10-CM

## 2024-01-27 DIAGNOSIS — I4891 Unspecified atrial fibrillation: Secondary | ICD-10-CM | POA: Diagnosis not present

## 2024-01-27 DIAGNOSIS — E785 Hyperlipidemia, unspecified: Secondary | ICD-10-CM | POA: Diagnosis not present

## 2024-01-27 LAB — APTT: aPTT: 39 s — ABNORMAL HIGH (ref 24–36)

## 2024-01-27 LAB — COMPREHENSIVE METABOLIC PANEL WITH GFR
ALT: 19 U/L (ref 0–44)
AST: 23 U/L (ref 15–41)
Albumin: 4.3 g/dL (ref 3.5–5.0)
Alkaline Phosphatase: 93 U/L (ref 38–126)
Anion gap: 14 (ref 5–15)
BUN: 21 mg/dL (ref 8–23)
CO2: 22 mmol/L (ref 22–32)
Calcium: 9.6 mg/dL (ref 8.9–10.3)
Chloride: 101 mmol/L (ref 98–111)
Creatinine, Ser: 1.45 mg/dL — ABNORMAL HIGH (ref 0.61–1.24)
GFR, Estimated: 52 mL/min — ABNORMAL LOW (ref >60)
Glucose, Bld: 269 mg/dL — ABNORMAL HIGH (ref 70–99)
Potassium: 4.4 mmol/L (ref 3.5–5.1)
Sodium: 136 mmol/L (ref 135–145)
Total Bilirubin: 0.6 mg/dL (ref 0.0–1.2)
Total Protein: 7.1 g/dL (ref 6.5–8.1)

## 2024-01-27 LAB — GLUCOSE, CAPILLARY
Glucose-Capillary: 144 mg/dL — ABNORMAL HIGH (ref 70–99)
Glucose-Capillary: 205 mg/dL — ABNORMAL HIGH (ref 70–99)

## 2024-01-27 LAB — CBC
HCT: 45.9 % (ref 39.0–52.0)
Hemoglobin: 14.8 g/dL (ref 13.0–17.0)
MCH: 31.2 pg (ref 26.0–34.0)
MCHC: 32.2 g/dL (ref 30.0–36.0)
MCV: 96.8 fL (ref 80.0–100.0)
Platelets: 276 K/uL (ref 150–400)
RBC: 4.74 MIL/uL (ref 4.22–5.81)
RDW: 14.4 % (ref 11.5–15.5)
WBC: 10.9 K/uL — ABNORMAL HIGH (ref 4.0–10.5)
nRBC: 0 % (ref 0.0–0.2)

## 2024-01-27 LAB — URINE DRUG SCREEN
Amphetamines: NEGATIVE
Barbiturates: NEGATIVE
Benzodiazepines: NEGATIVE
Cocaine: NEGATIVE
Fentanyl: NEGATIVE
Methadone Scn, Ur: NEGATIVE
Opiates: NEGATIVE
Tetrahydrocannabinol: NEGATIVE

## 2024-01-27 LAB — PROTIME-INR
INR: 1.3 — ABNORMAL HIGH (ref 0.8–1.2)
Prothrombin Time: 16.5 s — ABNORMAL HIGH (ref 11.4–15.2)

## 2024-01-27 LAB — DIFFERENTIAL
Abs Immature Granulocytes: 0.05 K/uL (ref 0.00–0.07)
Basophils Absolute: 0.1 K/uL (ref 0.0–0.1)
Basophils Relative: 1 %
Eosinophils Absolute: 0.2 K/uL (ref 0.0–0.5)
Eosinophils Relative: 2 %
Immature Granulocytes: 1 %
Lymphocytes Relative: 31 %
Lymphs Abs: 3.4 K/uL (ref 0.7–4.0)
Monocytes Absolute: 0.7 K/uL (ref 0.1–1.0)
Monocytes Relative: 6 %
Neutro Abs: 6.5 K/uL (ref 1.7–7.7)
Neutrophils Relative %: 59 %

## 2024-01-27 LAB — ETHANOL: Alcohol, Ethyl (B): <15 mg/dL (ref <15)

## 2024-01-27 LAB — CBG MONITORING, ED: Glucose-Capillary: 290 mg/dL — ABNORMAL HIGH (ref 70–99)

## 2024-01-27 MED ORDER — ACETAMINOPHEN 325 MG PO TABS
650.0000 mg | ORAL_TABLET | Freq: Four times a day (QID) | ORAL | Status: DC | PRN
  Administered 2024-01-27 – 2024-01-30 (×2): 650 mg via ORAL
  Filled 2024-01-27 (×2): qty 2

## 2024-01-27 MED ORDER — POLYETHYLENE GLYCOL 3350 17 G PO PACK: 17.0000 g | PACK | Freq: Every day | ORAL | Status: DC | PRN

## 2024-01-27 MED ORDER — APIXABAN 5 MG PO TABS
5.0000 mg | ORAL_TABLET | Freq: Two times a day (BID) | ORAL | Status: DC
  Administered 2024-01-27 – 2024-01-30 (×6): 5 mg via ORAL
  Filled 2024-01-27 (×6): qty 1

## 2024-01-27 MED ORDER — ACETAMINOPHEN 650 MG RE SUPP: 650.0000 mg | Freq: Four times a day (QID) | RECTAL | Status: DC | PRN

## 2024-01-27 MED ORDER — FINASTERIDE 5 MG PO TABS
5.0000 mg | ORAL_TABLET | Freq: Every day | ORAL | Status: DC
  Administered 2024-01-28 – 2024-01-30 (×3): 5 mg via ORAL
  Filled 2024-01-27 (×3): qty 1

## 2024-01-27 MED ORDER — SODIUM CHLORIDE 0.9 % IV BOLUS
500.0000 mL | Freq: Once | INTRAVENOUS | Status: AC
  Administered 2024-01-27: 500 mL via INTRAVENOUS

## 2024-01-27 MED ORDER — ONDANSETRON HCL 4 MG/2ML IJ SOLN: 4.0000 mg | Freq: Four times a day (QID) | INTRAMUSCULAR | Status: DC | PRN

## 2024-01-27 MED ORDER — PANTOPRAZOLE SODIUM 40 MG PO TBEC
40.0000 mg | DELAYED_RELEASE_TABLET | Freq: Two times a day (BID) | ORAL | Status: DC
  Administered 2024-01-27 – 2024-01-30 (×6): 40 mg via ORAL
  Filled 2024-01-27 (×6): qty 1

## 2024-01-27 MED ORDER — INSULIN ASPART 100 UNIT/ML IJ SOLN
0.0000 [IU] | Freq: Three times a day (TID) | INTRAMUSCULAR | Status: DC
  Administered 2024-01-27 – 2024-01-28 (×2): 2 [IU] via SUBCUTANEOUS
  Administered 2024-01-28: 5 [IU] via SUBCUTANEOUS
  Administered 2024-01-28 – 2024-01-29 (×2): 3 [IU] via SUBCUTANEOUS
  Administered 2024-01-29: 2 [IU] via SUBCUTANEOUS
  Administered 2024-01-29: 5 [IU] via SUBCUTANEOUS
  Administered 2024-01-30 (×2): 2 [IU] via SUBCUTANEOUS
  Filled 2024-01-27 (×10): qty 1

## 2024-01-27 MED ORDER — ONDANSETRON HCL 4 MG PO TABS
4.0000 mg | ORAL_TABLET | Freq: Four times a day (QID) | ORAL | Status: DC | PRN
  Administered 2024-01-30: 4 mg via ORAL

## 2024-01-27 MED ORDER — LORAZEPAM 2 MG/ML IJ SOLN
2.0000 mg | Freq: Once | INTRAMUSCULAR | Status: AC | PRN
  Administered 2024-01-27: 2 mg via INTRAVENOUS
  Filled 2024-01-27: qty 1

## 2024-01-27 MED ORDER — ORAL CARE MOUTH RINSE
15.0000 mL | OROMUCOSAL | Status: DC | PRN
Start: 2024-01-27 — End: 2024-01-30

## 2024-01-27 MED ORDER — ATORVASTATIN CALCIUM 20 MG PO TABS
20.0000 mg | ORAL_TABLET | Freq: Every day | ORAL | Status: DC
  Administered 2024-01-28 – 2024-01-30 (×3): 20 mg via ORAL
  Filled 2024-01-27 (×3): qty 1

## 2024-01-27 MED ORDER — IOHEXOL 350 MG/ML SOLN
100.0000 mL | Freq: Once | INTRAVENOUS | Status: AC | PRN
  Administered 2024-01-27: 75 mL via INTRAVENOUS

## 2024-01-27 MED ORDER — INSULIN GLARGINE-YFGN 100 UNIT/ML ~~LOC~~ SOLN
20.0000 [IU] | Freq: Every day | SUBCUTANEOUS | Status: DC
  Administered 2024-01-27 – 2024-01-29 (×3): 20 [IU] via SUBCUTANEOUS
  Filled 2024-01-27 (×4): qty 0.2

## 2024-01-27 MED ORDER — MUSCLE RUB 10-15 % EX CREA
TOPICAL_CREAM | CUTANEOUS | Status: DC | PRN
  Administered 2024-01-27: 1 via TOPICAL

## 2024-01-27 MED ORDER — INSULIN ASPART 100 UNIT/ML IJ SOLN
0.0000 [IU] | Freq: Every day | INTRAMUSCULAR | Status: DC
  Administered 2024-01-27: 2 [IU] via SUBCUTANEOUS
  Filled 2024-01-27: qty 1

## 2024-01-27 MED ORDER — STROKE: EARLY STAGES OF RECOVERY BOOK
Freq: Once | Status: AC
  Filled 2024-01-27: qty 1

## 2024-01-27 MED ORDER — ALLOPURINOL 100 MG PO TABS
200.0000 mg | ORAL_TABLET | Freq: Every day | ORAL | Status: DC
  Administered 2024-01-28 – 2024-01-30 (×3): 200 mg via ORAL
  Filled 2024-01-27 (×3): qty 2

## 2024-01-27 NOTE — Assessment & Plan Note (Signed)
 Resume rate control with metoprolol , continue holding diltiazem , to avoid bradycardia.  Continue anticoagulation with apixaban .

## 2024-01-27 NOTE — Inpatient Diabetes Management (Signed)
 Inpatient Diabetes Program Recommendations  AACE/ADA: New Consensus Statement on Inpatient Glycemic Control (2015)  Target Ranges:  Prepandial:   less than 140 mg/dL      Peak postprandial:   less than 180 mg/dL (1-2 hours)      Critically ill patients:  140 - 180 mg/dL   Lab Results  Component Value Date   GLUCAP 290 (H) 01/27/2024   HGBA1C 8.3 (H) 09/07/2023    Review of Glycemic Control  Latest Reference Range & Units 01/27/24 10:00  Glucose-Capillary 70 - 99 mg/dL 709 (H)   Diabetes history: DM 2 Outpatient Diabetes medications: Tresiba  40 units QPM Current orders for Inpatient glycemic control:  None being evaluated in the ED  Inpatient Diabetes Program Recommendations:    If in ED consider: -   Novolog  0-15 units Q4 if NPO  If admitted: -  Semglee  20 units -   Novolog  0-15 units tid + hs  Thanks,  Clotilda Bull RN, MSN, BC-ADM Inpatient Diabetes Coordinator Team Pager [phone] (8a-5p)

## 2024-01-27 NOTE — Assessment & Plan Note (Signed)
 Continue home allopurinol

## 2024-01-27 NOTE — Assessment & Plan Note (Signed)
 Estimated body mass index is 52.12 kg/m as calculated from the following:   Height as of this encounter: 6' 1 (1.854 m).   Weight as of this encounter: 179.2 kg.   - This will complicate overall prognosis -Encouraged weight loss

## 2024-01-27 NOTE — ED Notes (Signed)
 Patient transported to CT

## 2024-01-27 NOTE — Progress Notes (Signed)
  Transition of Care [facility]) Screening Note   Patient Details  Name: Kevin Goodman Date of Birth: 09-09-1950   Transition of Care [facility]) CM/SW Contact:    Hoy DELENA Bigness, LCSW Phone Number: 01/27/2024, 4:10 PM    Transition of Care Department [facility]) has reviewed patient and no TOC needs have been identified at this time. We will continue to monitor patient advancement through interdisciplinary progression rounds. If new patient transition needs arise, please place a TOC consult.    01/27/24 1610  TOC Brief Assessment  Insurance and Status Reviewed  Patient has primary care physician Yes  Home environment has been reviewed From home  Prior level of function: Independent  Prior/Current Home Services No current home services  Social Drivers of Health Review SDOH reviewed no interventions necessary  Readmission risk has been reviewed Yes  Transition of care needs no transition of care needs at this time

## 2024-01-27 NOTE — Assessment & Plan Note (Signed)
 Per patient he does not tolerate CPAP.

## 2024-01-27 NOTE — Assessment & Plan Note (Signed)
 Resume blood pressure control with metoprolol , hydralazine  and losartan 

## 2024-01-27 NOTE — Assessment & Plan Note (Signed)
 Continue home dose of Protonix

## 2024-01-27 NOTE — H&P (Signed)
 History and Physical    Patient: Kevin Goodman FMW:[id number] DOB: [birth year] DOA: 01/27/2024 DOS: the patient was seen and examined on 01/27/2024 PCP: Carlette Benita Area, MD  Patient coming from: Home  Chief Complaint:  Chief Complaint  Patient presents with   Stroke Like Symptoms   HPI: Kevin Goodman is a 73 y.o. 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  Patient went to bed at his baseline around 11 PM.  He woke up around 4 AM and was not feeling well, thinking that his blood pressure might be high, also noticed weakness of left arm and to lesser extent to the left leg.  No change in vision or headache.  Patient called his son who called the EMS.  Patient denies any chest pain, shortness of breath, any other recent illness.  No recent change in appetite or bowel habit.  No urinary symptoms.  Patient denies any smoking or alcohol  use.  ED course.  On presentation hemodynamically stable, normotensive with heart rate mostly in 50s, left-sided weakness resolved.  Labs with mild leukocytosis at 10.9, creatinine of 1.45 which is around his baseline. Initial CT head was negative.  MRI brain with acute infarct in the posterior right MCA territory and did show chronic ischemic disease and prior infarcts in the left MCA, left PCA and left cerebellar artery territory. 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 EKG, personally reviewed, atrial flutter with heart rate in 50s.  Neurology was consulted from ED and they recommended obtaining carotid Doppler for further evaluation of left cervical ICA stenosis.  Patient did not receive any aspirin  as he was on Eliquis .  [facility] was consulted for  admission to complete the stroke workup.  Review of Systems: As mentioned in the history of present illness. All other systems reviewed and are negative. Past Medical History:  Diagnosis Date   Atrial fibrillation (HCC)    a. initialy occurring in 2013  b. recurrence in 06/2020 in the setting of COVID-19 --> s/p DCCV in 07/2020   Chronic kidney disease, stage 3a (HCC)    Diabetes mellitus, type 2 (HCC)    Dysrhythmia    GERD (gastroesophageal reflux disease)    Gout    Hypertension    Mild pulmonary hypertension (HCC)    Morbid obesity (HCC)    OSA (obstructive sleep apnea)    Paroxysmal atrial flutter (HCC)    Sleep apnea    Past Surgical History:  Procedure Laterality Date   BALLOON DILATION N/A 12/25/2020   Procedure: BALLOON DILATION;  Surgeon: Cindie Carlin POUR, DO;  Location: [facility];  Service: Endoscopy;  Laterality: N/A;   BIOPSY  12/25/2020   Procedure: BIOPSY;  Surgeon: Cindie Carlin POUR, DO;  Location: [facility];  Service: Endoscopy;;   BIOPSY  08/05/2022   Procedure: BIOPSY;  Surgeon: Cindie Carlin POUR, DO;  Location: [facility];  Service: Endoscopy;;   CARDIOVERSION N/A 07/09/2020   Procedure: CARDIOVERSION;  Surgeon: Alvan Dorn FALCON, MD;  Location: [facility];  Service: Endoscopy;  Laterality: N/A;   COLONOSCOPY N/A 12/08/2016   sigmoid and descending colon diverticula, four semi-pedunculated polyps in descending and ascendign colon, few polyps in descending and ascending colon (tubular adenomas). 3 year  surveillance.    COLONOSCOPY WITH PROPOFOL  N/A 12/25/2020   Surgeon: Cindie Dunnings K, DO; Nonbleeding internal hemorrhoids, pancolonic diverticulosis, 2 mm tubular adenoma.  Recommended repeat in 5 years.   COLONOSCOPY WITH PROPOFOL  N/A 08/05/2022   Procedure: COLONOSCOPY WITH PROPOFOL ;  Surgeon: Cindie Dunnings POUR, DO;  Location: [facility];  Service: Endoscopy;  Laterality: N/A;  7:30 am, ASA 3   ESOPHAGOGASTRODUODENOSCOPY N/A 12/08/2016   normal  esophagus s/p empiric dilation, small hiatal hernia.    ESOPHAGOGASTRODUODENOSCOPY (EGD) WITH PROPOFOL  N/A 12/25/2020   Surgeon: Cindie Dunnings POUR, DO;  Normal esophagus s/p empiric dilation, gastritis biopsied, normal examined duodenum.  Pathology with slight chronic inflammation, no H. pylori.   ESOPHAGOGASTRODUODENOSCOPY (EGD) WITH PROPOFOL  N/A 08/05/2022   Procedure: ESOPHAGOGASTRODUODENOSCOPY (EGD) WITH PROPOFOL ;  Surgeon: Cindie Dunnings POUR, DO;  [facility]: [facility];  Service: Endoscopy;  Laterality: N/A;   FINGER SURGERY     left ring   INCISION AND DRAINAGE OF WOUND Left 04/20/2016   Procedure: LEFT THIGH DEBRIDEMENT;  Surgeon: Herlene Beverley Bureau, MD;  Location: [facility] OR;  Service: General;  Laterality: Left;   MALONEY DILATION N/A 12/08/2016   Procedure: AGAPITO HODGKIN;  Surgeon: Shaaron Lamar HERO, MD;  Location: [facility];  Service: Endoscopy;  Laterality: N/A;   POLYPECTOMY  12/08/2016   Procedure: POLYPECTOMY;  Surgeon: Shaaron Lamar HERO, MD;  [facility]: [facility];  Service: Endoscopy;;  colon   POLYPECTOMY  12/25/2020   Procedure: POLYPECTOMY;  Surgeon: Cindie Dunnings POUR, DO;  [facility];  Service: Endoscopy;;   POLYPECTOMY  08/05/2022   Procedure: POLYPECTOMY;  Surgeon: Cindie Dunnings POUR, DO;  [facility]: [facility];  Service: Endoscopy;;   Social History:  reports that he has been smoking cigars. He has been exposed to tobacco smoke. He has never used smokeless tobacco. He reports that he does not currently use alcohol . He reports that he does not use drugs.  No Known Allergies  Family History  Problem Relation Age of Onset   Heart failure Mother    Hypertension Mother    Hypertension Father    Stroke Father    Prostate cancer Father    Stroke Brother    Prostate cancer Brother    Colon cancer Neg Hx    Colon polyps Neg Hx     Prior to Admission medications   Medication Sig Start Date End Date Taking? Authorizing Provider  acetaminophen  (TYLENOL )  500 MG tablet Take 1,000 mg by mouth every 6 (six) hours as needed for moderate pain.   Yes [provider]  Allopurinol  200 MG TABS Take 1 tablet by mouth daily. 07/01/23  Yes [provider]  apixaban  (ELIQUIS ) 5 MG TABS tablet Take 1 tablet (5 mg total) by mouth 2 (two) times daily. 06/19/21  Yes Johnson, Clanford L, MD  atorvastatin  (LIPITOR) 20 MG tablet Take 20 mg by mouth daily.   Yes [provider]  B-D UF III MINI PEN NEEDLES 31G X 5 MM MISC  01/31/23  Yes [provider]  diltiazem  (TIAZAC ) 360 MG 24 hr capsule Take 1 capsule (360 mg total) by mouth daily. 02/12/23  Yes Ricky Fines, MD  ferrous sulfate  325 (65 FE) MG EC tablet Take 325 mg by mouth daily. 04/30/22  Yes [provider]  finasteride  (PROSCAR ) 5 MG tablet Take 5 mg by mouth daily.   Yes [provider]  furosemide  (LASIX ) 40 MG tablet Take 40 mg by mouth 2 (two) times daily.  Yes [provider]  hydrALAZINE  (APRESOLINE ) 100 MG tablet Take 100 mg by mouth 2 (two) times daily.   Yes [provider]  losartan  (COZAAR ) 100 MG tablet Take 1 tablet (100 mg total) by mouth daily. 10/27/20  Yes Strader, Laymon HERO, PA-C  metoprolol  tartrate (LOPRESSOR ) 50 MG tablet Take 1 tablet (50 mg total) by mouth 2 (two) times daily. 02/12/23  Yes Ricky Fines, MD  pantoprazole  (PROTONIX ) 40 MG tablet Take 1 tablet (40 mg total) by mouth 2 (two) times daily before a meal. 30 minutes before breakfast 12/25/20  Yes Cindie Dunnings K, DO  TRESIBA  FLEXTOUCH 100 UNIT/ML FlexTouch Pen Inject 40 Units into the skin every evening. 12/26/23  Yes [provider]  ALPRAZolam  (XANAX ) 1 MG tablet Take one tablet by mouth 1 hour prior to MRI Patient not taking: Reported on 01/27/2024 04/13/23   Katragadda, Sreedhar, MD  calcitRIOL (ROCALTROL) 0.25 MCG capsule Take 0.25 mcg by mouth 2 (two) times a week. Patient not taking: Reported on 01/27/2024 03/30/23   [provider]   cephALEXin  (KEFLEX ) 500 MG capsule Take 500 mg by mouth 3 (three) times daily. Patient not taking: Reported on 01/27/2024 10/10/23   [provider]  FARXIGA  5 MG TABS tablet Take 5 mg by mouth daily. Patient not taking: Reported on 01/27/2024 11/29/23   [provider]  ferrous gluconate  (FERGON) 324 MG tablet Take 1 tablet (324 mg total) by mouth daily with breakfast. Patient not taking: Reported on 01/27/2024 11/02/23   Geofm Delon BRAVO, NP  LANTUS  SOLOSTAR 100 UNIT/ML Solostar Pen Inject 40 Units into the skin at bedtime. Patient not taking: Reported on 01/27/2024 05/18/22   [provider]    Physical Exam: Vitals:   01/27/24 1000 01/27/24 1007 01/27/24 1015 01/27/24 1330  BP: 123/73  103/79 (!) 114/95  Pulse: 72  73 (!) 54  Resp: 15  15 18   Temp:  98.1 F (36.7 C)    TempSrc:  Oral    SpO2: 98%  99% 98%  Weight:      Height:       General: Vital signs reviewed.  Patient is morbidly obese, in no acute distress and cooperative with exam.  Head: Normocephalic and atraumatic. Eyes: EOMI, conjunctivae normal, no scleral icterus.  Neck: Supple, trachea midline, normal ROM,  Cardiovascular: Irregular with borderline bradycardia Pulmonary/Chest: Clear to auscultation bilaterally, no wheezes, rales, or rhonchi. Abdominal: Soft, non-tender, non-distended, BS +,  Extremities: No lower extremity edema bilaterally,  pulses symmetric and intact bilaterally. No cyanosis or clubbing. Neurological: A&O x3, Strength is normal and symmetric bilaterally, cranial nerve II-XII are grossly intact, no focal motor deficit, sensory intact to light touch bilaterally.  Skin: Warm, dry and intact. No rashes or erythema. Psychiatric: Normal mood and affect.   Data Reviewed: Prior data reviewed as mentioned above.  Assessment and Plan: * Acute CVA (cerebrovascular accident) (HCC) Initial CT head negative but MRI did show a patchy acute infarct in the posterior right MCA  territory, no hemorrhagic transformation or mass effect.  Did show chronic infarcts in the left MCA, left PCA and left cerebellar artery territories.  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  Symptoms improved.  No tPA needed. Neurology was consulted from ED.  - Admit to medical telemetry -Complete stroke workup, lipid panel, A1c, PT and OT evaluation and echocardiogram ordered. - Carotid ultrasound  ordered -Continuing home statin -No aspirin  ordered as patient is on Eliquis  -Follow-up neurology recommendations  Paroxysmal atrial flutter (HCC) EKG with atrial flutter.  Rate controlled. - Continuing home Eliquis  -Holding Cardizem  and metoprolol  as heart rate was in 50s and also allowing permissive hypertension. -Will restart home medications if rate started trending up.  Essential hypertension Blood pressure currently within goal. Is on multiple antihypertensives at home which are currently being held due to permissive hypertension.  Diabetes mellitus, type 2 (HCC) CBG elevated. -Check A1c -Semglee  at 20 units -Moderate SSI  Chronic kidney disease, stage 3a (HCC) Renal function within his baseline. - Monitor renal function -Avoid nephrotoxins  Morbid obesity (HCC) Estimated body mass index is 52.12 kg/m as calculated from the following:   Height as of this encounter: 6' 1 (1.854 m).   Weight as of this encounter: 179.2 kg.   - This will complicate overall prognosis -Encouraged weight loss  GERD (gastroesophageal reflux disease) - Continue home dose of Protonix   OSA (obstructive sleep apnea) Per patient he does not tolerate CPAP.  Gout - Continue home allopurinol     Advance Care Planning:   Code Status: Full Code   Consults: Neurology  Family Communication: Discussed with patient, no family at bedside.  Severity  of Illness: The appropriate patient status for this patient is OBSERVATION. Observation status is judged to be reasonable and necessary in order to provide the required intensity of service to ensure the patient's safety. The patient's presenting symptoms, physical exam findings, and initial radiographic and laboratory data in the context of their medical condition is felt to place them at decreased risk for further clinical deterioration. Furthermore, it is anticipated that the patient will be medically stable for discharge from the hospital within 2 midnights of admission.   This record has been created using Conservation officer, historic buildings. Errors have been sought and corrected,but may not always be located. Such creation errors do not reflect on the standard of care.   Author: Amaryllis Dare, MD 01/27/2024 2:19 PM  For on call review [email or web address].

## 2024-01-27 NOTE — Assessment & Plan Note (Signed)
 Symptoms continue to improve  Carotid US  with mild to moderate plaque at the level of the right carotid bulb and proximal right ICA. Estimated right ICA stenosis is less than 50%  Significant calcified and non calcified plaque at the left carotid bifurcation.   Echocardiogram with preserved LV systolic function, EF 50 to 55%, severe LVH, RV systolic function preserved, no significant valvular disease.  No signs of embolic phenomena.   Continue apixaban  and statin therapy Resume blood pressure control with losartan , metoprolol  and hydralazine   Follow up with Neurology final recommendations.

## 2024-01-27 NOTE — ED Triage Notes (Signed)
 Pt reports around 0400 this morning waking up and feeling like my blood pressure was up. Pt states his left arm just fell straight to his side and tried to bring his arm to his mouth and just could get it to his forehead symptoms occurring for about an hour. Pt reports symptoms have resolved but he wants to be checked out. Pt reports he has had episodes of intermittent my eye goes black but I have been checked out for that. EDP at bedside during triage.

## 2024-01-27 NOTE — Assessment & Plan Note (Signed)
 Stable renal function with serum cr at 1,31 with K at 3,9 and serum bicarbonate at 26  Na 139

## 2024-01-27 NOTE — ED Notes (Signed)
 Patient transported to MRI

## 2024-01-27 NOTE — ED Notes (Signed)
Pt having echo at this time

## 2024-01-27 NOTE — ED Provider Notes (Signed)
 [place] EMERGENCY DEPARTMENT AT [facility] Provider Note   CSN: [id number] Arrival date & time: 01/27/24  9061     Patient presents with: Stroke Like Symptoms   Kevin Goodman is a 73 y.o. male.   Pt is a 73 yo male with pmhx significant for DM2, HTN, afib (on Eliquis ), Gout, sleep apnea, morbid obesity, and pulmonary htn.  Pt went to sleep around 2300 last night.  He woke up around 0400 not feeling right.  He got up and walked around and realized his left arm was not working right.  He denies visual disturbances or difficulty swallowing.  His sx lasted about 1 hr.  He is back to normal now.       Prior to Admission medications   Medication Sig Start Date End Date Taking? Authorizing Provider  acetaminophen  (TYLENOL ) 500 MG tablet Take 1,000 mg by mouth every 6 (six) hours as needed for moderate pain.   Yes [provider]  Allopurinol  200 MG TABS Take 1 tablet by mouth daily. 07/01/23  Yes [provider]  apixaban  (ELIQUIS ) 5 MG TABS tablet Take 1 tablet (5 mg total) by mouth 2 (two) times daily. 06/19/21  Yes Johnson, Clanford L, MD  atorvastatin  (LIPITOR) 20 MG tablet Take 20 mg by mouth daily.   Yes [provider]  B-D UF III MINI PEN NEEDLES 31G X 5 MM MISC  01/31/23  Yes [provider]  diltiazem  (TIAZAC ) 360 MG 24 hr capsule Take 1 capsule (360 mg total) by mouth daily. 02/12/23  Yes Ricky Fines, MD  ferrous sulfate  325 (65 FE) MG EC tablet Take 325 mg by mouth daily. 04/30/22  Yes [provider]  finasteride  (PROSCAR ) 5 MG tablet Take 5 mg by mouth daily.   Yes [provider]  furosemide  (LASIX ) 40 MG tablet Take 40 mg by mouth 2 (two) times daily.   Yes [provider]  hydrALAZINE  (APRESOLINE ) 100 MG tablet Take 100 mg by mouth 2 (two) times daily.   Yes [provider]  losartan  (COZAAR ) 100 MG tablet Take 1 tablet (100 mg total) by mouth daily. 10/27/20  Yes Strader, Brittany M,  PA-C  metoprolol  tartrate (LOPRESSOR ) 50 MG tablet Take 1 tablet (50 mg total) by mouth 2 (two) times daily. 02/12/23  Yes Ricky Fines, MD  pantoprazole  (PROTONIX ) 40 MG tablet Take 1 tablet (40 mg total) by mouth 2 (two) times daily before a meal. 30 minutes before breakfast 12/25/20  Yes Cindie Dunnings K, DO  TRESIBA  FLEXTOUCH 100 UNIT/ML FlexTouch Pen Inject 40 Units into the skin every evening. 12/26/23  Yes [provider]  ALPRAZolam  (XANAX ) 1 MG tablet Take one tablet by mouth 1 hour prior to MRI Patient not taking: Reported on 01/27/2024 04/13/23   Katragadda, Sreedhar, MD  calcitRIOL (ROCALTROL) 0.25 MCG capsule Take 0.25 mcg by mouth 2 (two) times a week. Patient not taking: Reported on 01/27/2024 03/30/23   [provider]  cephALEXin  (KEFLEX ) 500 MG capsule Take 500 mg by mouth 3 (three) times daily. Patient not taking: Reported on 01/27/2024 10/10/23   [provider]  FARXIGA  5 MG TABS tablet Take 5 mg by mouth daily. Patient not taking: Reported on 01/27/2024 11/29/23   [provider]  ferrous gluconate  (FERGON) 324 MG tablet Take 1 tablet (324 mg total) by mouth daily with breakfast. Patient not taking: Reported on 01/27/2024 11/02/23   Geofm Delon BRAVO, NP  LANTUS  SOLOSTAR 100 UNIT/ML Solostar Pen Inject  40 Units into the skin at bedtime. Patient not taking: Reported on 01/27/2024 05/18/22   [provider]    Allergies: Patient has no known allergies.    Review of Systems  Neurological:  Positive for weakness and numbness.  All other systems reviewed and are negative.   Updated Vital Signs BP 103/79   Pulse 73   Temp 98.1 F (36.7 C) (Oral)   Resp 15   Ht 6' 1 (1.854 m)   Wt (!) 179.2 kg   SpO2 99%   BMI 52.12 kg/m   Physical Exam Vitals and nursing note reviewed.  Constitutional:      Appearance: Normal appearance. He is obese.  HENT:     Head: Normocephalic and atraumatic.     Right Ear: External ear normal.      Left Ear: External ear normal.     Nose: Nose normal.     Mouth/Throat:     Mouth: Mucous membranes are moist.     Pharynx: Oropharynx is clear.  Eyes:     Extraocular Movements: Extraocular movements intact.     Conjunctiva/sclera: Conjunctivae normal.     Pupils: Pupils are equal, round, and reactive to light.  Cardiovascular:     Rate and Rhythm: Normal rate. Rhythm irregular.     Pulses: Normal pulses.     Heart sounds: Normal heart sounds.  Pulmonary:     Effort: Pulmonary effort is normal.     Breath sounds: Normal breath sounds.  Abdominal:     General: Abdomen is flat. Bowel sounds are normal.     Palpations: Abdomen is soft.  Musculoskeletal:        General: Normal range of motion.     Cervical back: Normal range of motion and neck supple.  Skin:    General: Skin is warm.     Capillary Refill: Capillary refill takes less than 2 seconds.  Neurological:     General: No focal deficit present.     Mental Status: He is alert and oriented to person, place, and time.  Psychiatric:        Mood and Affect: Mood normal.        Behavior: Behavior normal.     (all labs ordered are listed, but only abnormal results are displayed) Labs Reviewed  PROTIME-INR - Abnormal; Notable for the following components:      Result Value   Prothrombin Time 16.5 (*)    INR 1.3 (*)    All other components within normal limits  APTT - Abnormal; Notable for the following components:   aPTT 39 (*)    All other components within normal limits  CBC - Abnormal; Notable for the following components:   WBC 10.9 (*)    All other components within normal limits  COMPREHENSIVE METABOLIC PANEL WITH GFR - Abnormal; Notable for the following components:   Glucose, Bld 269 (*)    Creatinine, Ser 1.45 (*)    GFR, Estimated 52 (*)    All other components within normal limits  CBG MONITORING, ED - Abnormal; Notable for the following components:   Glucose-Capillary 290 (*)    All other components  within normal limits  DIFFERENTIAL  ETHANOL  URINE DRUG SCREEN  CBG MONITORING, ED    EKG: EKG Interpretation Date/Time:  Friday January 27 2024 10:01:02 EST Ventricular Rate:  70 PR Interval:    QRS Duration:  104 QT Interval:  427 QTC Calculation: 461 R Axis:   193  Text Interpretation: Atrial  fibrillation Right axis deviation Low voltage, precordial leads Since last tracing rate slower Confirmed by Dean Clarity [phone]) on 01/27/2024 10:06:20 AM  Radiology: MR BRAIN WO CONTRAST Result Date: 01/27/2024 EXAM: MRI BRAIN WITHOUT CONTRAST 01/27/2024 12:53:48 PM TECHNIQUE: Multiplanar multisequence MRI of the head/brain was performed without the administration of intravenous contrast. COMPARISON: Head CT and CTA head and neck 01/27/2024 reported separately. Previous brain MRI 12/08/2021. CLINICAL HISTORY: 73 year old male with acute neuro deficit, stroke suspected. FINDINGS: BRAIN AND VENTRICLES: Patchy acute infarct in the posterior right MCA territory, perirolandic area, involving the superior central gyrus, characterized by a 2 cm area of cortical and subcortical white matter restricted diffusion on series 5 image 42 and early T2 and FLAIR hyperintensity on series 11 image 42. No contralateral or posterior fossa restricted diffusion identified. No hemorrhagic transformation or mass effect. Chronic cortical encephalomalacia in the left middle femoral gyrus, posterior left parietal lobe, left occipital lobe, and also left lateral cerebellum are stable since 2022-02-06. A small chronic lacunar infarct in the dorsal left thalamus is stable. Chronic confluent periventricular white matter T2 and FLAIR hyperintensity is stable. No chronic cerebral blood products identified. Otherwise, mild age-related signal heterogeneity in the deep gray nuclei. The brainstem appears negative. Motion degraded sagittal T1 imaging. Chronically abnormal left ICA siphon. But the left ICA flow void at the skull base appears  more abnormal compared to February 06, 2022. See CTA findings reported separately today. No intracranial hemorrhage. No mass. No midline shift. No hydrocephalus. The sella is unremarkable. ORBITS: No acute abnormality. SINUSES AND MASTOIDS: No acute abnormality. BONES AND SOFT TISSUES: Normal marrow signal. No acute soft tissue abnormality. IMPRESSION: 1. Patchy acute infarct in the posterior right MCA territory, perirolandic area. No hemorrhagic transformation or mass effect. 2. Otherwise stable chronic ischemic disease since a 02/06/2022 MRI, most pronounced in the left MCA, left PCA, and left cerebellar artery territories. 3. Abnormal LICA flow void, see CTA findings from today. Electronically signed by: Helayne Hurst MD 01/27/2024 01:09 PM EST RP Workstation: [id number]   CT ANGIO HEAD NECK W WO CM Result Date: 01/27/2024 EXAM: CTA HEAD AND NECK WITHOUT AND WITH 01/27/2024 10:45:08 AM TECHNIQUE: CTA of the head and neck was performed without and with the administration of 75 mL of iohexol  (OMNIPAQUE ) 350 MG/ML injection. Multiplanar 2D and/or 3D reformatted images are provided for review. Automated exposure control, iterative reconstruction, and/or weight based adjustment of the mA/kV was utilized to reduce the radiation dose to as low as reasonably achievable. Stenosis of the internal carotid arteries measured using NASCET criteria. COMPARISON: MRI brain 12/08/2021. CLINICAL HISTORY: Neuro deficit, acute, stroke suspected. FINDINGS: CTA NECK: AORTIC ARCH AND ARCH VESSELS: Aberrant right subclavian artery. Common origin of the right brachiocephalic and left common carotid arteries. No dissection or arterial injury. No significant stenosis of the brachiocephalic or subclavian arteries. CERVICAL CAROTID ARTERIES: Critical stenosis of the proximal left cervical ICA due to predominantly soft plaque seen on axial image 79 series 6. Diminutive appearance of the mid and distal cervical ICA. Mild calcified plaque along the right carotid  bulb and proximal right cervical ICA without significant stenosis. No dissection or arterial injury. CERVICAL VERTEBRAL ARTERIES: Left dominant vertebral artery. Limited evaluation of the vertebral artery origins due to streak artifact from the shoulders. No dissection, arterial injury, or significant stenosis of the cervical segments. LUNGS AND MEDIASTINUM: Unremarkable. SOFT TISSUES: No acute abnormality. BONES: Multilevel cervical spondylosis without high grade spinal canal stenosis. No suspicious bone lesion. CTA HEAD: ANTERIOR CIRCULATION: At least moderate  stenosis of the ophthalmic segment of the left ICA due to mixed plaque. Reconstitution of the left ICA terminus. Hypoplastic A1 segment of the left ACA. No significant stenosis of the right ICA. No significant stenosis of the right ACA. No significant stenosis of the middle cerebral arteries. No aneurysm. POSTERIOR CIRCULATION: Mild calcified plaque along the V4 segment of the nondominant right vertebral artery. No significant stenosis of the posterior cerebral arteries. No significant stenosis of the basilar artery. No significant stenosis of the vertebral arteries. No aneurysm. OTHER: No dural venous sinus thrombosis on this non-dedicated study. IMPRESSION: 1. No acute large vessel occlusion. 2. Critical stenosis of the proximal left cervical ICA due to predominantly soft plaque, with diminutive appearance of the mid and distal cervical ICA. 3. At least moderate stenosis of the ophthalmic segment of the left ICA due to mixed plaque. Electronically signed by: Ryan Chess MD 01/27/2024 11:15 AM EST RP Workstation: [id number]   CT HEAD WO CONTRAST Result Date: 01/27/2024 EXAM: CT HEAD WITHOUT CONTRAST 01/27/2024 10:36:40 AM TECHNIQUE: CT of the head was performed without the administration of intravenous contrast. Automated exposure control, iterative reconstruction, and/or weight based adjustment of the mA/kV was utilized to reduce the radiation dose  to as low as reasonably achievable. COMPARISON: CT head 12/08/2021. MRI brain 12/08/2021. CLINICAL HISTORY: Neuro deficit, acute, stroke suspected. FINDINGS: BRAIN AND VENTRICLES: No acute hemorrhage. No evidence of acute infarct. Chronic infarct in the left occipital lobe. Chronic infarct along the left middle frontal gyrus. Moderate chronic microvascular ischemic change. No hydrocephalus. No extra-axial collection. No mass effect or midline shift. [place] Stroke Program Early CT Score (ASPECTS) ----- Ganglionic (caudate, IC, lentiform nucleus, insula, M1-M3): 7 Supraganglionic (M4-M6): 3 Total: 10 ORBITS: No acute abnormality. SINUSES: No acute abnormality. SOFT TISSUES AND SKULL: No acute soft tissue abnormality. No skull fracture. IMPRESSION: 1. No acute intracranial abnormality. ASPECTS: 10. 2. Chronic infarct in the left occipital lobe and along the left middle frontal gyrus. Electronically signed by: Ryan Chess MD 01/27/2024 11:06 AM EST RP Workstation: [id number]     Procedures   Medications Ordered in the ED  iohexol  (OMNIPAQUE ) 350 MG/ML injection 100 mL (75 mLs Intravenous Contrast Given 01/27/24 1039)  LORazepam  (ATIVAN ) injection 2 mg (2 mg Intravenous Given 01/27/24 1224)  sodium chloride  0.9 % bolus 500 mL (500 mLs Intravenous Bolus 01/27/24 1328)                                    Medical Decision Making Amount and/or Complexity of Data Reviewed Labs: ordered. Radiology: ordered.  Risk Prescription drug management. Decision regarding hospitalization.   This patient presents to the ED for concern of left arm weakness/numbness, this involves an extensive number of treatment options, and is a complaint that carries with it a high risk of complications and morbidity.  The differential diagnosis includes tia, cva, head bleed   Co morbidities that complicate the patient evaluation  DM2, HTN, afib (on Eliquis ), Gout, sleep apnea, morbid obesity, and pulmonary  htn   Additional history obtained:  Additional history obtained from epic chart review  Lab Tests:  I Ordered, and personally interpreted labs.  The pertinent results include:  cbc with wbc sl elevated at 10.9, cmp nl other than glucose elevated at 269 and cr sl elevated at 1.45; inr 1.3; etoh neg   Imaging Studies ordered:  I ordered imaging studies including ct head, cta head/neck, mri  I independently visualized and interpreted imaging which showed  CT head: No acute intracranial abnormality. ASPECTS: 10.  2. Chronic infarct in the left occipital lobe and along the left middle frontal  gyrus.  CTA head/neck: No acute large vessel occlusion.  2. Critical stenosis of the proximal left cervical ICA due to predominantly  soft plaque, with diminutive appearance of the mid and distal cervical ICA.  3. At least moderate stenosis of the ophthalmic segment of the left ICA due to  mixed plaque.  MRI:  Patchy acute infarct in the posterior right MCA territory, perirolandic  area. No hemorrhagic transformation or mass effect.  2. Otherwise stable chronic ischemic disease since a Feb 06, 2022 MRI, most pronounced  in the left MCA, left PCA, and left cerebellar artery territories.  3. Abnormal LICA flow void, see CTA findings from today.   I agree with the radiologist interpretation   Cardiac Monitoring:  The patient was maintained on a cardiac monitor.  I personally viewed and interpreted the cardiac monitored which showed an underlying rhythm of: afib   Medicines ordered and prescription drug management:  I have reviewed the patients home medicines and have made adjustments as needed   Test Considered:  Cta/mri   Critical Interventions:  mri   Consultations Obtained:  I requested consultation with the neurologist (Dr. Merrianne),  and discussed lab and imaging findings as well as pertinent plan - he said it's ok for pt to stay here.  He will need admission for stroke work  up. Pt d/w Dr. Caleen ([facility]ad) for admission.   Problem List / ED Course:  CVA:  sx resolved and he's on Eliquis , so no indication for tpa.  Neurology recommends continuing Eliquis   Afib:  Neurology recommends continuing Eliquis  CHA2DS2/VAS Stroke Risk Points  Current as of about an hour ago     4 >= 2 Points: High Risk  1 to 1.99 Points: Medium Risk  0 Points: Low Risk    Last Change: N/A      Details    This score determines the patient's risk of having a stroke if the  patient has atrial fibrillation.       Points Metrics  1 Has Congestive Heart Failure:  Yes    Current as of about an hour ago  0 Has Vascular Disease:  No    Current as of about an hour ago  1 Has Hypertension:  Yes    Current as of about an hour ago  1 Age:  32    Current as of about an hour ago  1 Has Diabetes Excluding Gestational Diabetes:  Yes    Current as of about an hour ago  0 Had Stroke:  No  Had TIA:  No  Had Thromboembolism:  No    Current as of about an hour ago  0 Male:  No    Current as of about an hour ago             Reevaluation:  After the interventions noted above, I reevaluated the patient and found that they have :improved   Social Determinants of Health:  Lives at home   Dispostion:  After consideration of the diagnostic results and the patients response to treatment, I feel that the patent would benefit from admission.  CRITICAL CARE Performed by: Mliss Boyers   Total critical care time: 30 minutes  Critical care time was exclusive of separately billable procedures and treating other patients.  Critical care was necessary to treat  or prevent imminent or life-threatening deterioration.  Critical care was time spent personally by me on the following activities: development of treatment plan with patient and/or surrogate as well as nursing, discussions with consultants, evaluation of patient's response to treatment, examination of patient, obtaining history from  patient or surrogate, ordering and performing treatments and interventions, ordering and review of laboratory studies, ordering and review of radiographic studies, pulse oximetry and re-evaluation of patient's condition.        Final diagnoses:  Cerebrovascular accident (CVA), unspecified mechanism (HCC)  Atherosclerosis of left carotid artery  Paroxysmal atrial fibrillation [facility])    ED Discharge Orders     None          Dean Clarity, MD 01/27/24 1331

## 2024-01-27 NOTE — Assessment & Plan Note (Signed)
 Continue glucose cover and monitoring with insulin  sliding scale  Basal insulin  with 20 units glargine  Fasting glucose today 157 mg/dl

## 2024-01-28 ENCOUNTER — Other Ambulatory Visit (HOSPITAL_COMMUNITY): Payer: Self-pay | Admitting: *Deleted

## 2024-01-28 ENCOUNTER — Observation Stay (HOSPITAL_COMMUNITY)

## 2024-01-28 DIAGNOSIS — I10 Essential (primary) hypertension: Secondary | ICD-10-CM | POA: Diagnosis not present

## 2024-01-28 DIAGNOSIS — E1122 Type 2 diabetes mellitus with diabetic chronic kidney disease: Secondary | ICD-10-CM | POA: Diagnosis not present

## 2024-01-28 DIAGNOSIS — I4891 Unspecified atrial fibrillation: Secondary | ICD-10-CM | POA: Diagnosis not present

## 2024-01-28 DIAGNOSIS — E66813 Obesity, class 3: Secondary | ICD-10-CM

## 2024-01-28 DIAGNOSIS — K219 Gastro-esophageal reflux disease without esophagitis: Secondary | ICD-10-CM | POA: Diagnosis not present

## 2024-01-28 DIAGNOSIS — I4892 Unspecified atrial flutter: Secondary | ICD-10-CM | POA: Diagnosis not present

## 2024-01-28 DIAGNOSIS — I639 Cerebral infarction, unspecified: Secondary | ICD-10-CM | POA: Diagnosis not present

## 2024-01-28 LAB — LIPID PANEL
Cholesterol: 98 mg/dL (ref 0–200)
HDL: 30 mg/dL — ABNORMAL LOW (ref >40)
LDL Cholesterol: 41 mg/dL (ref 0–99)
Total CHOL/HDL Ratio: 3.3 ratio
Triglycerides: 137 mg/dL (ref <150)
VLDL: 27 mg/dL (ref 0–40)

## 2024-01-28 LAB — CBC
HCT: 43.7 % (ref 39.0–52.0)
Hemoglobin: 14.4 g/dL (ref 13.0–17.0)
MCH: 31.7 pg (ref 26.0–34.0)
MCHC: 33 g/dL (ref 30.0–36.0)
MCV: 96.3 fL (ref 80.0–100.0)
Platelets: 245 K/uL (ref 150–400)
RBC: 4.54 MIL/uL (ref 4.22–5.81)
RDW: 14.3 % (ref 11.5–15.5)
WBC: 11 K/uL — ABNORMAL HIGH (ref 4.0–10.5)
nRBC: 0 % (ref 0.0–0.2)

## 2024-01-28 LAB — BASIC METABOLIC PANEL WITH GFR
Anion gap: 10 (ref 5–15)
BUN: 18 mg/dL (ref 8–23)
CO2: 26 mmol/L (ref 22–32)
Calcium: 9.1 mg/dL (ref 8.9–10.3)
Chloride: 103 mmol/L (ref 98–111)
Creatinine, Ser: 1.21 mg/dL (ref 0.61–1.24)
GFR, Estimated: >60 mL/min (ref >60)
Glucose, Bld: 157 mg/dL — ABNORMAL HIGH (ref 70–99)
Potassium: 3.8 mmol/L (ref 3.5–5.1)
Sodium: 139 mmol/L (ref 135–145)

## 2024-01-28 LAB — ECHOCARDIOGRAM COMPLETE
Area-P 1/2: 5.84 cm2
Height: 73 in
S' Lateral: 2.8 cm
Weight: 6437.43 [oz_av]

## 2024-01-28 LAB — GLUCOSE, CAPILLARY
Glucose-Capillary: 142 mg/dL — ABNORMAL HIGH (ref 70–99)
Glucose-Capillary: 209 mg/dL — ABNORMAL HIGH (ref 70–99)
Glucose-Capillary: 152 mg/dL — ABNORMAL HIGH (ref 70–99)
Glucose-Capillary: 172 mg/dL — ABNORMAL HIGH (ref 70–99)

## 2024-01-28 LAB — HEMOGLOBIN A1C
Hgb A1c MFr Bld: 9.1 % — ABNORMAL HIGH (ref 4.8–5.6)
Mean Plasma Glucose: 214.47 mg/dL

## 2024-01-28 MED ORDER — HYDRALAZINE HCL 100 MG PO TABS: 100.0000 mg | ORAL_TABLET | Freq: Two times a day (BID) | ORAL | Status: DC

## 2024-01-28 MED ORDER — LOSARTAN POTASSIUM 50 MG PO TABS
100.0000 mg | ORAL_TABLET | Freq: Every day | ORAL | Status: DC
  Administered 2024-01-28 – 2024-01-30 (×3): 100 mg via ORAL
  Filled 2024-01-28 (×3): qty 2

## 2024-01-28 MED ORDER — HYDRALAZINE HCL 50 MG PO TABS
50.0000 mg | ORAL_TABLET | Freq: Two times a day (BID) | ORAL | Status: DC
  Administered 2024-01-28 – 2024-01-30 (×4): 50 mg via ORAL
  Filled 2024-01-28 (×4): qty 1

## 2024-01-28 MED ORDER — METOPROLOL TARTRATE 50 MG PO TABS
50.0000 mg | ORAL_TABLET | Freq: Two times a day (BID) | ORAL | Status: DC
  Administered 2024-01-28 – 2024-01-30 (×5): 50 mg via ORAL
  Filled 2024-01-28 (×5): qty 1

## 2024-01-28 NOTE — Plan of Care (Signed)
  Problem: Education: Goal: Knowledge of General Education information will improve Description: Including pain rating scale, medication(s)/side effects and non-pharmacologic comfort measures Outcome: Progressing   Problem: Health Behavior/Discharge Planning: Goal: Ability to manage health-related needs will improve Outcome: Progressing   Problem: Clinical Measurements: Goal: Ability to maintain clinical measurements within normal limits will improve Outcome: Progressing Goal: Will remain free from infection Outcome: Progressing Goal: Diagnostic test results will improve Outcome: Progressing Goal: Respiratory complications will improve Outcome: Progressing Goal: Cardiovascular complication will be avoided Outcome: Progressing   Problem: Activity: Goal: Risk for activity intolerance will decrease Outcome: Progressing   Problem: Nutrition: Goal: Adequate nutrition will be maintained Outcome: Progressing   Problem: Coping: Goal: Level of anxiety will decrease Outcome: Progressing   Problem: Elimination: Goal: Will not experience complications related to bowel motility Outcome: Progressing Goal: Will not experience complications related to urinary retention Outcome: Progressing   Problem: Pain Managment: Goal: General experience of comfort will improve and/or be controlled Outcome: Progressing   Problem: Safety: Goal: Ability to remain free from injury will improve Outcome: Progressing   Problem: Skin Integrity: Goal: Risk for impaired skin integrity will decrease Outcome: Progressing   Problem: Coping: Goal: Ability to adjust to condition or change in health will improve Outcome: Progressing   Problem: Fluid Volume: Goal: Ability to maintain a balanced intake and output will improve Outcome: Progressing   Problem: Health Behavior/Discharge Planning: Goal: Ability to identify and utilize available resources and services will improve Outcome: Progressing Goal:  Ability to manage health-related needs will improve Outcome: Progressing   Problem: Metabolic: Goal: Ability to maintain appropriate glucose levels will improve Outcome: Progressing   Problem: Nutritional: Goal: Maintenance of adequate nutrition will improve Outcome: Progressing Goal: Progress toward achieving an optimal weight will improve Outcome: Progressing   Problem: Skin Integrity: Goal: Risk for impaired skin integrity will decrease Outcome: Progressing   Problem: Education: Goal: Knowledge of disease or condition will improve Outcome: Progressing Goal: Knowledge of secondary prevention will improve (MUST DOCUMENT ALL) Outcome: Progressing Goal: Knowledge of patient specific risk factors will improve (DELETE if not current risk factor) Outcome: Progressing   Problem: Ischemic Stroke/TIA Tissue Perfusion: Goal: Complications of ischemic stroke/TIA will be minimized Outcome: Progressing   Problem: Coping: Goal: Will verbalize positive feelings about self Outcome: Progressing Goal: Will identify appropriate support needs Outcome: Progressing   Problem: Health Behavior/Discharge Planning: Goal: Ability to manage health-related needs will improve Outcome: Progressing Goal: Goals will be collaboratively established with patient/family Outcome: Progressing   Problem: Nutrition: Goal: Risk of aspiration will decrease Outcome: Progressing Goal: Dietary intake will improve Outcome: Progressing

## 2024-01-28 NOTE — Plan of Care (Signed)
  Problem: Acute Rehab PT Goals(only PT should resolve) Goal: Pt Will Go Supine/Side To Sit Outcome: Progressing Flowsheets (Taken 01/28/2024 1113) Pt will go Supine/Side to Sit: Independently Goal: Patient Will Transfer Sit To/From Stand Outcome: Progressing Flowsheets (Taken 01/28/2024 1113) Patient will transfer sit to/from stand: with modified independence Goal: Pt Will Transfer Bed To Chair/Chair To Bed Outcome: Progressing Flowsheets (Taken 01/28/2024 1113) Pt will Transfer Bed to Chair/Chair to Bed: with modified independence Goal: Pt Will Ambulate Outcome: Progressing Flowsheets (Taken 01/28/2024 1113) Pt will Ambulate:  with modified independence  with least restrictive assistive device  50 feet Goal: Pt Will Go Up/Down Stairs Outcome: Progressing Flowsheets (Taken 01/28/2024 1113) Pt will Go Up / Down Stairs:  6-9 stairs  with rail(s)  with supervision   11:13 AM, 01/28/24 Rosaria Settler, PT, DPT [place] with [facility]

## 2024-01-28 NOTE — Evaluation (Addendum)
 Physical Therapy Evaluation Patient Details Name: Kevin Goodman MRN: [id number] DOB: 28-Apr-1950 Today's Date: 01/28/2024  History of Present Illness  Kevin Goodman is a 73 y.o. 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     Patient went to bed at his baseline around 11 PM.  He woke up around 4 AM and was not feeling well, thinking that his blood pressure might be high, also noticed weakness of left arm and to lesser extent to the left leg.  No change in vision or headache.  Patient called his son who called the EMS.     Patient denies any chest pain, shortness of breath, any other recent illness.  No recent change in appetite or bowel habit.  No urinary symptoms.     Patient denies any smoking or alcohol  use.   Clinical Impression  Patient agreeable to PT evaluation. Family present at beginning of session but departs early on. 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 This date, patient is modified independent with bed mobility, and CGA/supervision for transfers and ambulation. Pt requires total assist to donn socks, reports he wears slide in shoes at baseline and does not usually put on socks. Patient demonstrates no stark/concerning differences between RUE/LUE sensation, ROM, strength, grip, or coordination (see details below). No stark difference between RLE/LLE sensation and coordination either but pt does present with some differences in RLE/LLE strength, and ROM, which pt reports as baseline due to past LLE surgery. Pt is unsteady on his feet, heavily encouraged use of SPC once home for balance. Pt reports understanding. Pt becomes SOB during session as well, SpO2 measured right after mobility as 98% on RA with HR 101 bpm. Pt reports having oxygen at home that  he does not use regularly. Overall, pt reports feeling near his baseline. Pt tolerates sitting in chair at end of session, call button in reach, and all needs met. Patient will benefit from continued skilled physical therapy acutely and in recommended venue in order to address current deficits and improve overall function.        If plan is discharge home, recommend the following: A little help with walking and/or transfers;Assist for transportation;Help with stairs or ramp for entrance;Assistance with cooking/housework   Can travel by private vehicle        Equipment Recommendations None recommended by PT  Recommendations for Other Services       Functional Status Assessment Patient has had a recent decline in their functional status and demonstrates the ability to make significant improvements in function in a reasonable and predictable amount of time.     Precautions / Restrictions Precautions Precautions: Fall Recall of Precautions/Restrictions: Intact Restrictions Weight Bearing Restrictions Per Provider Order: No      Mobility  Bed Mobility Overal bed mobility: Modified Independent     General bed mobility comments: HOB flattened, use of railings, inc time required due to labored movement, no physical assist    Transfers Overall transfer level: Needs assistance Equipment used: None Transfers: Sit to/from Stand, Bed to chair/wheelchair/BSC Sit to Stand: Contact guard assist, Supervision   Step pivot transfers: Contact guard assist, Supervision       General transfer comment: first STS from bed required 2 attempts, STS from recliner with improvement - 1 attempt, STS from std  toilet with use of railings, step pivot from bed to chair pt uses arm rests of chair for support due to mild unsteadiness/LE weakness, reports this as baseline when he first gets up in the mornings    Ambulation/Gait Ambulation/Gait assistance: Contact guard assist, Supervision Gait Distance  (Feet): 20 Feet Assistive device: None Gait Pattern/deviations: Step-through pattern, Decreased step length - right, Decreased step length - left, Decreased stride length, Staggering right Gait velocity: Dec     General Gait Details: Pt ambulates in room without AD, and CGA/supervision throughout, pt unsteady most with initial steps but improves with duration of task until last few steps where pt drifts/stumbles to R some and uses Wall for stability. Reports no dizziness or lightheadedness but does demonstrate some SOB  Stairs            Wheelchair Mobility     Tilt Bed    Modified Rankin (Stroke Patients Only)       Balance Overall balance assessment: Mild deficits observed, not formally tested               Pertinent Vitals/Pain Pain Assessment Pain Assessment: No/denies pain    Home Living Family/patient expects to be discharged to:: Private residence Living Arrangements: Children Available Help at Discharge: Family;Available PRN/intermittently Type of Home: House Home Access: Stairs to enter Entrance Stairs-Rails: Right Entrance Stairs-Number of Steps: 8   Home Layout: Two level;Able to live on main level with bedroom/bathroom Home Equipment: [place] - single point Additional Comments: Pt reports no change in home set up since last hospital admission. Step son lives with him. Has SPC but does not use.    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 equal bilat, ankle DF MMT 4+/5 bilat, knee extension AROM equal, hip flexion AROM limited on LLE compared to RLE (pt reports as baseline d/t past injury/surgery) - MMT on RLE 4-/5 on LLE 3+/5, alternating foot taps WNL, heel to shin impaired w LLE)    Cervical / Trunk Assessment Cervical / Trunk Assessment: Normal  Communication   Communication Communication: No apparent difficulties    Cognition Arousal: Alert Behavior During Therapy: WFL for tasks assessed/performed   PT - Cognitive impairments: No apparent impairments         Following commands: Intact       Cueing Cueing Techniques: Verbal cues, Tactile cues, Visual cues     General Comments      Exercises     Assessment/Plan    PT Assessment Patient needs continued PT services;All further PT needs can be met in the next venue of care  PT Problem List Decreased strength;Decreased activity tolerance;Decreased balance       PT Treatment Interventions DME instruction;Gait training;Stair training;Functional mobility training;Therapeutic activities;Therapeutic exercise;Balance training;Patient/family education    PT Goals (Current goals can be found in the Care Plan section)  Acute Rehab PT Goals Patient Stated Goal:  return home PT Goal Formulation: With patient Time For Goal Achievement: 01/30/24 Potential to Achieve Goals: Good    Frequency Min 3X/week     Co-evaluation               AM-PAC PT 6 Clicks Mobility  Outcome Measure Help needed turning from your back to your side while in a flat bed without using bedrails?: A Little Help needed moving from lying on your back to sitting on the side of a flat bed without using bedrails?: A Lot Help needed moving to and from a bed to a chair (including a wheelchair)?: A Little Help needed standing  up from a chair using your arms (e.g., wheelchair or bedside chair)?: A Little Help needed to walk in hospital room?: A Little Help needed climbing 3-5 steps with a railing? : A Lot 6 Click Score: 16    End of Session   Activity Tolerance: Patient tolerated treatment well;Patient limited by fatigue Patient left: in chair;with call bell/phone within reach   PT Visit Diagnosis: Unsteadiness on feet (R26.81);Other symptoms and signs involving the nervous system (R29.898);Muscle weakness (generalized) (M62.81)    Time: 9144-9078 PT Time Calculation (min) (ACUTE ONLY): 26 min   Charges:   PT Evaluation $PT Eval Moderate Complexity: 1 Mod   PT General Charges $$ ACUTE PT VISIT: 1 Visit       11:10 AM, 01/28/24 Rosaria Settler, PT, DPT [place] with [facility]

## 2024-01-28 NOTE — TOC Initial Note (Signed)
 Transition of Care [facility]) - Initial/Assessment Note    Patient Details  Name: Kevin Goodman MRN: [id number] Date of Birth: 1950/12/02  Transition of Care [facility]) CM/SW Contact:    Lucie Lunger, LCSWA Phone Number: 01/28/2024, 11:43 AM  Clinical Narrative:                 CSW updated that PT is recommending HH PT for pt at D/C. CSW met with pt at bedside to review recommendation. Pt states he is agreeable to [facility] being arranged and does not have an agency preference. CSW to refer [facility] out. Pt states he has a cane to use when needed. TOC to follow.   Expected Discharge Plan: Home w Home Health Services Barriers to Discharge: Continued Medical Work up   Patient Goals and CMS Choice Patient states their goals for this hospitalization and ongoing recovery are:: get better CMS Medicare.gov Compare Post Acute Care list provided to:: Patient Choice offered to / list presented to : Patient      Expected Discharge Plan and Services In-house Referral: Clinical Social Work Discharge Planning Services: CM Consult Post Acute Care Choice: Home Health Living arrangements for the past 2 months: Single Family Home                                      Prior Living Arrangements/Services Living arrangements for the past 2 months: Single Family Home Lives with:: Self Patient language and need for interpreter reviewed:: Yes Do you feel safe going back to the place where you live?: Yes      Need for Family Participation in Patient Care: Yes (Comment) Care giver support system in place?: Yes (comment)   Criminal Activity/Legal Involvement Pertinent to Current Situation/Hospitalization: No - Comment as needed  Activities of Daily Living   ADL Screening (condition at time of admission) Independently performs ADLs?: Yes (appropriate for developmental age) Is the patient deaf or have difficulty hearing?: No Does the patient have difficulty seeing, even when wearing glasses/contacts?:  No Does the patient have difficulty concentrating, remembering, or making decisions?: No  Permission Sought/Granted                  Emotional Assessment Appearance:: Appears stated age Attitude/Demeanor/Rapport: Engaged Affect (typically observed): Accepting Orientation: : Oriented to Self, Oriented to Place, Oriented to  Time, Oriented to Situation Alcohol  / Substance Use: Not Applicable Psych Involvement: No (comment)  Admission diagnosis:  Paroxysmal atrial fibrillation (HCC) [I48.0] Atherosclerosis of left carotid artery [I65.22] Acute CVA (cerebrovascular accident) (HCC) [I63.9] Cerebrovascular accident (CVA), unspecified mechanism (HCC) [I63.9] Patient Active Problem List   Diagnosis Date Noted   Acute CVA (cerebrovascular accident) (HCC) 01/27/2024   Atherosclerosis of left carotid artery 01/27/2024   Chronic kidney disease, stage 3a (HCC)    OSA (obstructive sleep apnea)    Gout    Low iron 11/02/2023   Acute cystitis 09/07/2023   Morbid obesity (HCC) 09/07/2023   Stage 3b chronic kidney disease (CKD) (HCC) 09/07/2023   Generalized weakness 09/07/2023   Leukocytosis 09/07/2023   Accidental overdose 02/11/2023   Bradycardia 02/10/2023   Rectal bleeding 06/07/2022   Low ferritin 06/07/2022   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   History of CVA (cerebrovascular accident) 12/11/2021   AKI (acute kidney injury) 12/09/2021   Tachycardia 12/09/2021   Atrial fibrillation with RVR (HCC) 12/09/2021   Class 3 obesity (HCC) 12/09/2021   Diabetes mellitus,  type 2 (HCC) 12/09/2021   Essential hypertension 12/09/2021   Elevated LFTs 12/08/2020   Constipation 12/08/2020   Pneumonia due to COVID-19 virus 06/11/2020   Uncontrolled type 2 diabetes mellitus with hyperglycemia, with long-term current use of insulin  (HCC) 06/11/2020   Paroxysmal atrial flutter (HCC)    Colon adenomas 06/10/2020   GERD (gastroesophageal reflux disease) 06/10/2020   A-fib (HCC) 06/10/2020   Dysphagia  11/11/2016   Encounter for screening colonoscopy 11/11/2016   Abscess 04/19/2016   Long term (current) use of anticoagulants 07/29/2011   Type 2 diabetes mellitus (HCC) 07/20/2011   Atrial fibrillation (HCC) 07/19/2011   Hypertension 07/19/2011   Obesity, Class III, BMI 40-49.9 (morbid obesity) (HCC) 07/19/2011   PCP:  Carlette Benita Area, MD Pharmacy:   [facility] [address], [place] - [address] [place] [address] Phone: [phone] Fax: [phone]     Social Drivers of Health (SDOH) Social History: SDOH Screenings   Food Insecurity: No Food Insecurity (01/27/2024)  Housing: Low Risk  (01/27/2024)  Transportation Needs: No Transportation Needs (01/27/2024)  Utilities: Not At Risk (01/27/2024)  Depression (PHQ2-9): Low Risk  (11/02/2023)  Social Connections: Moderately Isolated (01/27/2024)  Tobacco Use: High Risk (01/27/2024)   SDOH Interventions:     Readmission Risk Interventions    09/08/2023   12:14 PM  Readmission Risk Prevention Plan  Post Dischage Appt Complete  Medication Screening Complete  Transportation Screening Complete

## 2024-01-28 NOTE — Progress Notes (Signed)
*  PRELIMINARY RESULTS* Echocardiogram 2D Echocardiogram has been performed with saline bubble study.  Kevin Goodman 01/28/2024, 11:32 AM

## 2024-01-28 NOTE — Progress Notes (Signed)
 Progress Note   Patient: Kevin Goodman FMW:[id number] DOB: 1950/05/30 DOA: 01/27/2024     0 DOS: the patient was seen and examined on 01/28/2024   Brief hospital course: [name] was admitted to the hospital with the working diagnosis of acute CVA   73 yo male with the past medical history of hypertension, and prior CVA who presented with left sided weakness. On the day of admission around 4 am patient woke up feeling left arm weakness. On his initial physical examination blood pressure 114/95, HR 54, RR 15 and 02 saturation 98%  Lungs with no wheezing or rhonchi, heart with S1 and S2 present, irregularly, abdomen soft and no lower extremity edema.  Brain MRI with patchy acute infarct in the posterior right MCA territory, perirolandic area. No hemorrhagic transformation or mass effect.  Stable chronic ischemic disease since 2022-02-16 MRI, most pronounced in the left MCA, left PCA and left cerebellar artery territories.    CTA head no acute large vessel occlusion. 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  At least moderate stenosis of the ophthalmic segment of the left ICA due to mixed plaque.    EKG 70 bpm, left axis, qtc 461, sinus rhythm with no significant ST segment or T wave changes.   Assessment and Plan: * Acute CVA (cerebrovascular accident) (HCC) Symptoms continue to improve  Carotid US  with mild to moderate plaque at the level of the right carotid bulb and proximal right ICA. Estimated right ICA stenosis is less than 50%  Significant calcified and non calcified plaque at the left carotid bifurcation.   Echocardiogram with preserved LV systolic function, EF 50 to 55%, severe LVH, RV systolic function preserved, no significant valvular disease.  No signs of embolic phenomena.   Continue apixaban  and statin therapy Resume blood pressure control with losartan , metoprolol  and hydralazine   Follow up with Neurology final  recommendations.   Paroxysmal atrial flutter (HCC) Resume rate control with metoprolol , continue holding diltiazem , to avoid bradycardia.  Continue anticoagulation with apixaban .   Essential hypertension Resume blood pressure control with metoprolol , hydralazine  and losartan    Diabetes mellitus, type 2 (HCC) Continue glucose cover and monitoring with insulin  sliding scale  Basal insulin  with 20 units glargine  Fasting glucose today 157 mg/dl   Chronic kidney disease, stage 3a (HCC) Renal function stable with serum cr at 1.21 with K at 3.8 and serum bicarbonate at 26  Na 139  Plan to continue close monitoring renal function and electrolytes.   Gout - Continue home allopurinol   GERD (gastroesophageal reflux disease) - Continue home dose of Protonix   OSA (obstructive sleep apnea) Per patient he does not tolerate CPAP.  Obesity, class 3 (HCC) Calculated BMI is 53.0         Subjective: Patient with improvement in strength on the left side, no chest pain or dyspnea, tolerating po well.   Physical Exam: Vitals:   01/27/24 1750 01/27/24 1946 01/27/24 2359 01/28/24 0408  BP: (!) 143/73 (!) 143/82 122/65 (!) 184/77  Pulse: 79 71 82 64  Resp: 18 20 20  (!) 22  Temp: (!) 97.5 F (36.4 C) 97.8 F (36.6 C) 97.7 F (36.5 C) 98.7 F (37.1 C)  TempSrc: Oral Oral Oral Oral  SpO2: 96% 100% 95% 94%  Weight:      Height:       Neurology awake and alert, symmetric strength 4/4 upper and lower extremities.  ENT with mild pallor Cardiovascular with S1 and S2 present  and regular with no gallops, rubs or murmurs Respiratory with no rales or wheezing, no rhonchi  Abdomen with no distention  No lower extremity edema  Data Reviewed:    Family Communication: no family at the bedside   Disposition: Status is: Observation The patient remains OBS appropriate and will d/c before 2 midnights.  Planned Discharge Destination: Home      Author: Elidia Toribio Furnace,  MD 01/28/2024 4:50 PM  For on call review [email or web address].

## 2024-01-28 NOTE — Care Management Obs Status (Signed)
 MEDICARE OBSERVATION STATUS NOTIFICATION   Patient Details  Name: Kevin Goodman MRN: [id number] Date of Birth: 07-28-1950   Medicare Observation Status Notification Given:  Yes    Lucie Lunger, LCSWA 01/28/2024, 9:05 AM

## 2024-01-28 NOTE — Plan of Care (Signed)
  Problem: Education: Goal: Knowledge of General Education information will improve Description: Including pain rating scale, medication(s)/side effects and non-pharmacologic comfort measures Outcome: Progressing   Problem: Health Behavior/Discharge Planning: Goal: Ability to manage health-related needs will improve Outcome: Progressing   Problem: Clinical Measurements: Goal: Ability to maintain clinical measurements within normal limits will improve Outcome: Progressing Goal: Will remain free from infection Outcome: Progressing   Problem: Fluid Volume: Goal: Ability to maintain a balanced intake and output will improve Outcome: Progressing   Problem: Education: Goal: Knowledge of disease or condition will improve Outcome: Progressing Goal: Knowledge of secondary prevention will improve (MUST DOCUMENT ALL) Outcome: Progressing Goal: Knowledge of patient specific risk factors will improve (DELETE if not current risk factor) Outcome: Progressing   Problem: Ischemic Stroke/TIA Tissue Perfusion: Goal: Complications of ischemic stroke/TIA will be minimized Outcome: Progressing   Problem: Coping: Goal: Will verbalize positive feelings about self Outcome: Progressing Goal: Will identify appropriate support needs Outcome: Progressing   Problem: Health Behavior/Discharge Planning: Goal: Ability to manage health-related needs will improve Outcome: Progressing Goal: Goals will be collaboratively established with patient/family Outcome: Progressing   Problem: Self-Care: Goal: Ability to participate in self-care as condition permits will improve Outcome: Progressing Goal: Verbalization of feelings and concerns over difficulty with self-care will improve Outcome: Progressing Goal: Ability to communicate needs accurately will improve Outcome: Progressing   Problem: Nutrition: Goal: Risk of aspiration will decrease Outcome: Progressing Goal: Dietary intake will improve Outcome:  Progressing

## 2024-01-28 NOTE — Hospital Course (Addendum)
 Mr. Rone was admitted to the hospital with the working diagnosis of acute CVA   73 yo male with the past medical history of hypertension, and prior CVA who presented with left sided weakness. On the day of admission around 4 am patient woke up feeling left arm weakness. On his initial physical examination blood pressure 114/95, HR 54, RR 15 and 02 saturation 98%  Lungs with no wheezing or rhonchi, heart with S1 and S2 present, irregularly, abdomen soft and no lower extremity edema. Neurologically patient had no focal weakness.   Na 136, K 4.4 Cl 101 bicarbonate 22, glucose 269, bun 21 cr 1,45  AST 23 ALT 19  Wbc 11.0 hgb 14.4 plt 245  LDL 41, HDL 30 total cholesterol 98 and triglycerides 137  Toxicology screen negative   CT head with no acute intracranial abnormalities. Chronic infarct in the left occipital lobe and along the middle frontal gyrus.   Brain MRI with patchy acute infarct in the posterior right MCA territory, perirolandic area. No hemorrhagic transformation or mass effect.  Stable chronic ischemic disease since 2022/02/09 MRI, most pronounced in the left MCA, left PCA and left cerebellar artery territories.    CTA head no acute large vessel occlusion. 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  At least moderate stenosis of the ophthalmic segment of the left ICA due to mixed plaque.    EKG 70 bpm, left axis, qtc 461, atrial fibrillation, with no significant ST segment or T wave changes.   Clinically improving, pending Neurology consultation.

## 2024-01-29 DIAGNOSIS — G4733 Obstructive sleep apnea (adult) (pediatric): Secondary | ICD-10-CM | POA: Diagnosis not present

## 2024-01-29 DIAGNOSIS — N1831 Chronic kidney disease, stage 3a: Secondary | ICD-10-CM | POA: Diagnosis not present

## 2024-01-29 DIAGNOSIS — E66813 Obesity, class 3: Secondary | ICD-10-CM | POA: Diagnosis not present

## 2024-01-29 DIAGNOSIS — E1122 Type 2 diabetes mellitus with diabetic chronic kidney disease: Secondary | ICD-10-CM | POA: Diagnosis not present

## 2024-01-29 DIAGNOSIS — I10 Essential (primary) hypertension: Secondary | ICD-10-CM | POA: Diagnosis not present

## 2024-01-29 DIAGNOSIS — K219 Gastro-esophageal reflux disease without esophagitis: Secondary | ICD-10-CM | POA: Diagnosis not present

## 2024-01-29 DIAGNOSIS — I4892 Unspecified atrial flutter: Secondary | ICD-10-CM | POA: Diagnosis not present

## 2024-01-29 DIAGNOSIS — Z794 Long term (current) use of insulin: Secondary | ICD-10-CM | POA: Diagnosis not present

## 2024-01-29 DIAGNOSIS — I639 Cerebral infarction, unspecified: Secondary | ICD-10-CM | POA: Diagnosis not present

## 2024-01-29 LAB — GLUCOSE, CAPILLARY
Glucose-Capillary: 149 mg/dL — ABNORMAL HIGH (ref 70–99)
Glucose-Capillary: 157 mg/dL — ABNORMAL HIGH (ref 70–99)
Glucose-Capillary: 216 mg/dL — ABNORMAL HIGH (ref 70–99)
Glucose-Capillary: 116 mg/dL — ABNORMAL HIGH (ref 70–99)

## 2024-01-29 LAB — BASIC METABOLIC PANEL WITH GFR
Anion gap: 10 (ref 5–15)
BUN: 14 mg/dL (ref 8–23)
CO2: 26 mmol/L (ref 22–32)
Calcium: 9.2 mg/dL (ref 8.9–10.3)
Chloride: 103 mmol/L (ref 98–111)
Creatinine, Ser: 1.31 mg/dL — ABNORMAL HIGH (ref 0.61–1.24)
GFR, Estimated: 58 mL/min — ABNORMAL LOW (ref >60)
Glucose, Bld: 107 mg/dL — ABNORMAL HIGH (ref 70–99)
Potassium: 3.9 mmol/L (ref 3.5–5.1)
Sodium: 139 mmol/L (ref 135–145)

## 2024-01-29 MED ORDER — MELATONIN 3 MG PO TABS
6.0000 mg | ORAL_TABLET | Freq: Every evening | ORAL | Status: DC | PRN
  Administered 2024-01-29: 6 mg via ORAL
  Filled 2024-01-29: qty 2

## 2024-01-29 NOTE — Plan of Care (Signed)
   Problem: Health Behavior/Discharge Planning: Goal: Ability to manage health-related needs will improve Outcome: Progressing   Problem: Clinical Measurements: Goal: Ability to maintain clinical measurements within normal limits will improve Outcome: Progressing Goal: Will remain free from infection Outcome: Progressing Goal: Diagnostic test results will improve Outcome: Progressing

## 2024-01-29 NOTE — Progress Notes (Addendum)
 Progress Note   Patient: Kevin Goodman FMW:[id number] DOB: March 18, 1950 DOA: 01/27/2024     0 DOS: the patient was seen and examined on 01/29/2024   Brief hospital course: Mr. Korol was admitted to the hospital with the working diagnosis of acute CVA   73 yo male with the past medical history of hypertension, and prior CVA who presented with left sided weakness. On the day of admission around 4 am patient woke up feeling left arm weakness. On his initial physical examination blood pressure 114/95, HR 54, RR 15 and 02 saturation 98%  Lungs with no wheezing or rhonchi, heart with S1 and S2 present, irregularly, abdomen soft and no lower extremity edema. Neurologically patient had no focal weakness.   Na 136, K 4.4 Cl 101 bicarbonate 22, glucose 269, bun 21 cr 1,45  AST 23 ALT 19  Wbc 11.0 hgb 14.4 plt 245  LDL 41, HDL 30 total cholesterol 98 and triglycerides 137  Toxicology screen negative   CT head with no acute intracranial abnormalities. Chronic infarct in the left occipital lobe and along the middle frontal gyrus.   Brain MRI with patchy acute infarct in the posterior right MCA territory, perirolandic area. No hemorrhagic transformation or mass effect.  Stable chronic ischemic disease since 02/17/22 MRI, most pronounced in the left MCA, left PCA and left cerebellar artery territories.    CTA head no acute large vessel occlusion. 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  At least moderate stenosis of the ophthalmic segment of the left ICA due to mixed plaque.    EKG 70 bpm, left axis, qtc 461, atrial fibrillation, with no significant ST segment or T wave changes.   Clinically improving, pending Neurology consultation.   Assessment and Plan: * Acute CVA (cerebrovascular accident) (HCC) Symptoms continue to improve  Carotid US  with mild to moderate plaque at the level of the right carotid bulb and proximal right ICA. Estimated right ICA  stenosis is less than 50%  Significant calcified and non calcified plaque at the left carotid bifurcation.   Echocardiogram with preserved LV systolic function, EF 50 to 55%, severe LVH, RV systolic function preserved, no significant valvular disease.  No signs of embolic phenomena.   Continue apixaban  and statin therapy Continue blood pressure control with losartan , metoprolol  and hydralazine   Follow up with Neurology final recommendations (no neurology coverage for routine consults on the weekend).   Paroxysmal atrial flutter (HCC) Resume rate control with metoprolol , .  Continue anticoagulation with apixaban .   Essential hypertension Continue blood pressure control with metoprolol , hydralazine  and losartan    Diabetes mellitus, type 2 (HCC) Continue glucose cover and monitoring with insulin  sliding scale  Basal insulin  with 20 units glargine   Chronic kidney disease, stage 3a (HCC) Stable renal function with serum cr at 1,31 with K at 3,9 and serum bicarbonate at 26  Na 139   Gout No acute flare Continue with allopurinol .   GERD (gastroesophageal reflux disease) Continue with pantoprazole .   OSA (obstructive sleep apnea) Per patient he does not tolerate CPAP.  Obesity, class 3 (HCC) Calculated BMI is 53.0       Subjective: Patient with improvement on neurologic deficit, no chest pain and no dyspnea.   Physical Exam: Vitals:   01/28/24 2330 01/29/24 0317 01/29/24 0845 01/29/24 1319  BP: (!) 154/83 130/79 122/69 (!) 105/53  Pulse: 84 83 79 76  Resp: (!) 22 20 18 19   Temp: 98.1 F (36.7 C) 97.9  F (36.6 C) (!) 97.5 F (36.4 C) 98 F (36.7 C)  TempSrc: Oral Oral Oral Oral  SpO2: 95% 92% 96% 96%  Weight:      Height:       Neurology awake and alert ENT with mild pallor Cardiovascular with S1 and S2 present, irregularly irregular  with no gallops, rubs or murmurs Respiratory with no rales or wheezing, no rhonchi  Abdomen with no distention  No lower  extremity edema   Data Reviewed:    Family Communication: no family at the bedside   Disposition: Status is: Observation The patient remains OBS appropriate and will d/c before 2 midnights.  Planned Discharge Destination: Home    Author: Elidia Toribio Furnace, MD 01/29/2024 1:49 PM  For on call review [email or web address].

## 2024-01-29 NOTE — Plan of Care (Signed)
  Problem: Education: Goal: Knowledge of General Education information will improve Description: Including pain rating scale, medication(s)/side effects and non-pharmacologic comfort measures Outcome: Progressing   Problem: Health Behavior/Discharge Planning: Goal: Ability to manage health-related needs will improve Outcome: Progressing   Problem: Clinical Measurements: Goal: Ability to maintain clinical measurements within normal limits will improve Outcome: Progressing Goal: Will remain free from infection Outcome: Progressing Goal: Diagnostic test results will improve Outcome: Progressing   Problem: Fluid Volume: Goal: Ability to maintain a balanced intake and output will improve Outcome: Progressing   Problem: Health Behavior/Discharge Planning: Goal: Ability to identify and utilize available resources and services will improve Outcome: Progressing Goal: Ability to manage health-related needs will improve Outcome: Progressing   Problem: Education: Goal: Knowledge of disease or condition will improve Outcome: Progressing Goal: Knowledge of secondary prevention will improve (MUST DOCUMENT ALL) Outcome: Progressing Goal: Knowledge of patient specific risk factors will improve (DELETE if not current risk factor) Outcome: Progressing   Problem: Ischemic Stroke/TIA Tissue Perfusion: Goal: Complications of ischemic stroke/TIA will be minimized Outcome: Progressing   Problem: Coping: Goal: Will verbalize positive feelings about self Outcome: Progressing Goal: Will identify appropriate support needs Outcome: Progressing   Problem: Health Behavior/Discharge Planning: Goal: Ability to manage health-related needs will improve Outcome: Progressing Goal: Goals will be collaboratively established with patient/family Outcome: Progressing   Problem: Self-Care: Goal: Ability to participate in self-care as condition permits will improve Outcome: Progressing Goal: Verbalization  of feelings and concerns over difficulty with self-care will improve Outcome: Progressing Goal: Ability to communicate needs accurately will improve Outcome: Progressing   Problem: Nutrition: Goal: Risk of aspiration will decrease Outcome: Progressing Goal: Dietary intake will improve Outcome: Progressing

## 2024-01-30 DIAGNOSIS — I639 Cerebral infarction, unspecified: Secondary | ICD-10-CM | POA: Diagnosis not present

## 2024-01-30 DIAGNOSIS — R29700 NIHSS score 0: Secondary | ICD-10-CM

## 2024-01-30 DIAGNOSIS — K219 Gastro-esophageal reflux disease without esophagitis: Secondary | ICD-10-CM | POA: Diagnosis not present

## 2024-01-30 DIAGNOSIS — N1831 Chronic kidney disease, stage 3a: Secondary | ICD-10-CM | POA: Diagnosis not present

## 2024-01-30 DIAGNOSIS — I4891 Unspecified atrial fibrillation: Secondary | ICD-10-CM

## 2024-01-30 DIAGNOSIS — M1A9XX0 Chronic gout, unspecified, without tophus (tophi): Secondary | ICD-10-CM | POA: Diagnosis not present

## 2024-01-30 DIAGNOSIS — Z7901 Long term (current) use of anticoagulants: Secondary | ICD-10-CM

## 2024-01-30 DIAGNOSIS — I6522 Occlusion and stenosis of left carotid artery: Secondary | ICD-10-CM | POA: Diagnosis not present

## 2024-01-30 DIAGNOSIS — I6389 Other cerebral infarction: Principal | ICD-10-CM

## 2024-01-30 DIAGNOSIS — I10 Essential (primary) hypertension: Secondary | ICD-10-CM | POA: Diagnosis not present

## 2024-01-30 DIAGNOSIS — Z794 Long term (current) use of insulin: Secondary | ICD-10-CM | POA: Diagnosis not present

## 2024-01-30 DIAGNOSIS — E1122 Type 2 diabetes mellitus with diabetic chronic kidney disease: Secondary | ICD-10-CM | POA: Diagnosis not present

## 2024-01-30 DIAGNOSIS — G4733 Obstructive sleep apnea (adult) (pediatric): Secondary | ICD-10-CM | POA: Diagnosis not present

## 2024-01-30 DIAGNOSIS — E119 Type 2 diabetes mellitus without complications: Secondary | ICD-10-CM

## 2024-01-30 DIAGNOSIS — E66813 Obesity, class 3: Secondary | ICD-10-CM | POA: Diagnosis not present

## 2024-01-30 LAB — GLUCOSE, CAPILLARY
Glucose-Capillary: 136 mg/dL — ABNORMAL HIGH (ref 70–99)
Glucose-Capillary: 138 mg/dL — ABNORMAL HIGH (ref 70–99)

## 2024-01-30 MED ORDER — ATORVASTATIN CALCIUM 40 MG PO TABS: 40.0000 mg | ORAL_TABLET | Freq: Every day | ORAL | 2 refills | Status: AC

## 2024-01-30 NOTE — TOC Transition Note (Signed)
 Transition of Care [facility]) - Discharge Note   Patient Details  Name: Kevin Goodman MRN: [id number] Date of Birth: 01/05/1951  Transition of [facility] CM/SW Contact:  Sharlyne Stabs, RN Phone Number: 01/30/2024, 2:31 PM   Clinical Narrative:   Patient discharging home with [facility] health, orders placed, [facility] updated.     Final next level of care: Home w Home Health Services Barriers to Discharge: Barriers Resolved   Patient Goals and CMS Choice Patient states their goals for this hospitalization and ongoing recovery are:: get better CMS Medicare.gov Compare Post Acute Care list provided to:: Patient Choice offered to / list presented to : Patient      Discharge Placement          Patient and family notified of of transfer: 01/30/24  Discharge Plan and Services Additional resources added to the After Visit Summary for   In-house Referral: Clinical Social Work Discharge Planning Services: CM Consult Post Acute Care Choice: Home Health                 Social Drivers of Health (SDOH) Interventions SDOH Screenings   Food Insecurity: No Food Insecurity (01/27/2024)  Housing: Low Risk  (01/27/2024)  Transportation Needs: No Transportation Needs (01/27/2024)  Utilities: Not At Risk (01/27/2024)  Depression (PHQ2-9): Low Risk  (11/02/2023)  Social Connections: Moderately Isolated (01/27/2024)  Tobacco Use: High Risk (01/27/2024)    Readmission Risk Interventions    09/08/2023   12:14 PM  Readmission Risk Prevention Plan  Post Dischage Appt Complete  Medication Screening Complete  Transportation Screening Complete

## 2024-01-30 NOTE — Plan of Care (Signed)
   Problem: Education: Goal: Knowledge of General Education information will improve Description Including pain rating scale, medication(s)/side effects and non-pharmacologic comfort measures Outcome: Progressing   Problem: Health Behavior/Discharge Planning: Goal: Ability to manage health-related needs will improve Outcome: Progressing

## 2024-01-30 NOTE — Discharge Summary (Signed)
 Physician Discharge Summary   Patient: Kevin Goodman MRN: [id number] DOB: 1951/02/19  Admit date:     01/27/2024  Discharge date: 01/30/24  Discharge Physician: Eric Nunnery   PCP: Carlette Benita Area, MD   Recommendations at discharge:  Repeat basic metabolic panel to follow electrolytes and renal function Please make sure patient follow-up/arrange outpatient visit with vascular surgery as instructed in quadrant to have further evaluation for left carotid artery disease. Continue to assist patient with weight loss management Reassess blood pressure and adjust medication as needed.  Discharge Diagnoses: Principal Problem:   Acute CVA (cerebrovascular accident) [facility]) Active Problems:   Paroxysmal atrial flutter (HCC)   Essential hypertension   Diabetes mellitus, type 2 (HCC)   Chronic kidney disease, stage 3a (HCC)   Gout   GERD (gastroesophageal reflux disease)   OSA (obstructive sleep apnea)   Obesity, class 3 (HCC)  Brief Hospital admission narrative: As per H&P written by Dr. Caleen on 01/27/2024 Kevin Goodman is a 73 y.o. 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   Patient went to bed at his baseline around 11 PM.  He woke up around 4 AM and was not feeling well, thinking that his blood pressure might be high, also noticed weakness of left arm and to lesser extent to the left leg.  No change in vision or headache.  Patient called his son who called the EMS.   Patient denies any chest pain, shortness of breath, any other recent illness.  No recent change in appetite or bowel habit.  No urinary symptoms.   Patient denies any smoking or alcohol  use.   ED course.  On presentation hemodynamically stable, normotensive with heart rate mostly in 50s, left-sided weakness resolved.  Labs with mild  leukocytosis at 10.9, creatinine of 1.45 which is around his baseline. Initial CT head was negative.  MRI brain with acute infarct in the posterior right MCA territory and did show chronic ischemic disease and prior infarcts in the left MCA, left PCA and left cerebellar artery territory. 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 EKG, personally reviewed, atrial flutter with heart rate in 50s.   Neurology was consulted from ED and they recommended obtaining carotid Doppler for further evaluation of left cervical ICA stenosis.   Patient did not receive any aspirin  as he was on Eliquis .   TRH was consulted for admission to complete the stroke workup.  Assessment and Plan: * Acute CVA (cerebrovascular accident) (HCC) Symptoms continue to improve  Carotid US  with mild to moderate plaque at the level of the right carotid bulb and proximal right ICA. Estimated right ICA stenosis is less than 50%  Significant calcified and non calcified plaque at the left carotid bifurcation.   Echocardiogram with preserved LV systolic function, EF 50 to 55%, severe LVH, RV systolic function preserved, no significant valvular disease.  No signs of embolic phenomena.   Continue apixaban  and statin therapy Continue blood pressure control with losartan , metoprolol  and hydralazine    Following final recommendations by neurology service outpatient follow-up with vascular surgery has been recommended in order to further evaluate and decide future treatments regarding left carotid artery stenosis disease.  Patient statin's dose has been adjusted for better control.  Continue risk factor modifications  Paroxysmal atrial flutter (HCC) -Continue metoprolol  for rate control - Continue apixaban  for secondary prevention.  Essential hypertension -Continue home antihypertensive agents - Heart  healthy/low-sodium diet discussed with patient.  Diabetes mellitus, type 2 (HCC) -Modified carbohydrate diet discussed with patient - Resume home hypoglycemic regimen - Continue patient follow-up to further assess medications as required.  Chronic kidney disease, stage 3a (HCC) -Stable and currently at baseline - Continue to follow renal function trend/stability - Minimize nephrotoxic agents.  Gout -No acute flare -Continue with allopurinol .   GERD (gastroesophageal reflux disease) -Continue with pantoprazole .   OSA (obstructive sleep apnea) -Per patient he does not tolerate CPAP. - Continue weight loss management.  Obesity, class 3 (HCC) -Calculated BMI is 53.0  - Low-calorie diet, portion control and increase physical activity discussed with patient.  Consultants: Neurology service Procedures performed: See below for x-ray reports Disposition: Home with home health PT Diet recommendation: Heart healthy/modified carbohydrate diet and low calorie diet.  DISCHARGE MEDICATION: Allergies as of 01/30/2024   No Known Allergies      Medication List     STOP taking these medications    ALPRAZolam  1 MG tablet Commonly known as: Xanax    calcitRIOL 0.25 MCG capsule Commonly known as: ROCALTROL   cephALEXin  500 MG capsule Commonly known as: KEFLEX    Farxiga  5 MG Tabs tablet Generic drug: dapagliflozin  propanediol   ferrous gluconate  324 MG tablet Commonly known as: FERGON   Lantus  SoloStar 100 UNIT/ML Solostar Pen Generic drug: insulin  glargine       TAKE these medications    acetaminophen  500 MG tablet Commonly known as: TYLENOL  Take 1,000 mg by mouth every 6 (six) hours as needed for moderate pain.   Allopurinol  200 MG Tabs Take 1 tablet by mouth daily.   apixaban  5 MG Tabs tablet Commonly known as: ELIQUIS  Take 1 tablet (5 mg total) by mouth 2 (two) times daily.   atorvastatin  40 MG tablet Commonly known as: LIPITOR Take 1 tablet (40 mg total) by  mouth daily. What changed:  medication strength how much to take   B-D UF III MINI PEN NEEDLES 31G X 5 MM Misc Generic drug: Insulin  Pen Needle   diltiazem  360 MG 24 hr capsule Commonly known as: TIAZAC  Take 1 capsule (360 mg total) by mouth daily.   ferrous sulfate  325 (65 FE) MG EC tablet Take 325 mg by mouth daily.   finasteride  5 MG tablet Commonly known as: PROSCAR  Take 5 mg by mouth daily.   furosemide  40 MG tablet Commonly known as: LASIX  Take 40 mg by mouth 2 (two) times daily.   hydrALAZINE  100 MG tablet Commonly known as: APRESOLINE  Take 100 mg by mouth 2 (two) times daily.   losartan  100 MG tablet Commonly known as: COZAAR  Take 1 tablet (100 mg total) by mouth daily.   metoprolol  tartrate 50 MG tablet Commonly known as: LOPRESSOR  Take 1 tablet (50 mg total) by mouth 2 (two) times daily.   pantoprazole  40 MG tablet Commonly known as: PROTONIX  Take 1 tablet (40 mg total) by mouth 2 (two) times daily before a meal. 30 minutes before breakfast   Tresiba  FlexTouch 100 UNIT/ML FlexTouch Pen Generic drug: insulin  degludec Inject 40 Units into the skin every evening.        Follow-up Information     Fanta, Benita Area, MD Follow up in 10 day(s).   Specialty: Internal Medicine Contact information: [address] [place] [address] [phone]  Sheree Penne Bruckner, MD. Schedule an appointment as soon as possible for a visit in 4 week(s).   Specialties: Vascular Surgery, Cardiology Why: for further evaluation on built up plaque in carotid arteries. Contact information: [address] [place] [address] [phone]                Discharge Exam: Fredricka Weights   01/27/24 0958 01/27/24 1448  Weight: (!) 179.2 kg (!) 182.5 kg   General exam: Alert, awake, oriented x 3 Respiratory system: Clear to auscultation. Respiratory effort normal.  Good saturation on room air. Cardiovascular system:RRR. No  murmurs, rubs, gallops. Gastrointestinal system: Abdomen is soft, nondistended, soft and nontender. No organomegaly or masses felt. Normal bowel sounds heard. Central nervous system: No focal neurological deficits. Extremities: No cyanosis or clubbing. Skin: No rashes, lesions or ulcers Psychiatry: Judgement and insight appear normal. Mood & affect appropriate.    Condition at discharge: Stable and improved.  The results of significant diagnostics from this hospitalization (including imaging, microbiology, ancillary and laboratory) are listed below for reference.   Imaging Studies: ECHOCARDIOGRAM COMPLETE Result Date: 01/28/2024    ECHOCARDIOGRAM REPORT   Patient Name:   NNAMDI DACUS Date of Exam: 01/28/2024 Medical Rec #:  [id number]        Height:       73.0 in Accession #:    [id number]       Weight:       402.3 lb Date of Birth:  1950/09/15         BSA:          2.896 m Patient Age:    73 years         BP:           184/77 mmHg Patient Gender: M                HR:           64 bpm. Exam Location:  [facility] Procedure: 2D Echo, Cardiac Doppler, Color Doppler and Saline Contrast Bubble            Study (Both Spectral and Color Flow Doppler were utilized during            procedure). Indications:    Stroke l63.9  History:        Patient has prior history of Echocardiogram examinations, most                 recent 06/18/2021. Arrythmias:Atrial Fibrillation and Atrial                 Flutter; Risk Factors:Former Smoker, Diabetes and Hypertension.  Sonographer:    Aida Pizza RCS Referring Phys: [id number] SUMAYYA AMIN IMPRESSIONS  1. Left ventricular ejection fraction, by estimation, is 50 to 55%. The left ventricle has low normal function. The left ventricle has no regional wall motion abnormalities. There is severe left ventricular hypertrophy. Left ventricular diastolic parameters are indeterminate.  2. Right ventricular systolic function is normal. The right ventricular size is normal.  3. Left  atrial size was moderately dilated.  4. Right atrial size was moderately dilated.  5. The mitral valve is normal in structure. Trivial mitral valve regurgitation. No evidence of mitral stenosis. Moderate mitral annular calcification.  6. The aortic valve is tricuspid. Aortic valve regurgitation is not visualized. No aortic stenosis is present.  7. The inferior vena cava is normal in size with greater than 50% respiratory variability, suggesting right atrial pressure of 3 mmHg.  8. Agitated saline contrast bubble study was negative, with no evidence of any interatrial shunt. FINDINGS  Left Ventricle: Left ventricular ejection fraction, by estimation, is 50 to 55%. The left ventricle has low normal function. The left ventricle has no regional wall motion abnormalities. The left ventricular internal cavity size was normal in size. There is severe left ventricular hypertrophy. Left ventricular diastolic function could not be evaluated due to atrial fibrillation. Left ventricular diastolic parameters are indeterminate. Right Ventricle: The right ventricular size is normal. Right ventricular systolic function is normal. Left Atrium: Left atrial size was moderately dilated. Right Atrium: Right atrial size was moderately dilated. Pericardium: There is no evidence of pericardial effusion. Mitral Valve: The mitral valve is normal in structure. Moderate mitral annular calcification. Trivial mitral valve regurgitation. No evidence of mitral valve stenosis. Tricuspid Valve: The tricuspid valve is normal in structure. Tricuspid valve regurgitation is trivial. No evidence of tricuspid stenosis. Aortic Valve: The aortic valve is tricuspid. Aortic valve regurgitation is not visualized. No aortic stenosis is present. Pulmonic Valve: The pulmonic valve was normal in structure. Pulmonic valve regurgitation is not visualized. No evidence of pulmonic stenosis. Aorta: The aortic root is normal in size and structure. Venous: The inferior  vena cava is normal in size with greater than 50% respiratory variability, suggesting right atrial pressure of 3 mmHg. IAS/Shunts: No atrial level shunt detected by color flow Doppler. Agitated saline contrast was given intravenously to evaluate for intracardiac shunting. Agitated saline contrast bubble study was negative, with no evidence of any interatrial shunt.  LEFT VENTRICLE PLAX 2D LVIDd:         4.40 cm LVIDs:         2.80 cm LV PW:         1.40 cm LV IVS:        1.50 cm LVOT diam:     2.00 cm LV SV:         46 LV SV Index:   16 LVOT Area:     3.14 cm  RIGHT VENTRICLE TAPSE (M-mode): 1.7 cm LEFT ATRIUM             Index        RIGHT ATRIUM           Index LA diam:        5.00 cm 1.73 cm/m   RA Area:     20.50 cm LA Vol (A2C):   84.2 ml 29.08 ml/m  RA Volume:   60.10 ml  20.75 ml/m LA Vol (A4C):   87.6 ml 30.25 ml/m LA Biplane Vol: 88.3 ml 30.49 ml/m  AORTIC VALVE LVOT Vmax:   69.83 cm/s LVOT Vmean:  52.300 cm/s LVOT VTI:    0.147 m  AORTA Ao Root diam: 3.70 cm MITRAL VALVE MV Area (PHT): 5.84 cm     SHUNTS MV Decel Time: 130 msec     Systemic VTI:  0.15 m MV E velocity: 109.00 cm/s  Systemic Diam: 2.00 cm Redell Shallow MD Electronically signed by Redell Shallow MD Signature Date/Time: 01/28/2024/11:45:55 AM    Final    US  Carotid Bilateral (at [facility] and [facility] only) Result Date: 01/27/2024 CLINICAL DATA:  Cerebral infarction, left-sided weakness, hypertension, hyperlipidemia and smoking history. EXAM: BILATERAL CAROTID DUPLEX ULTRASOUND TECHNIQUE: Elnor scale imaging, color Doppler and duplex ultrasound were performed of bilateral carotid and vertebral arteries in the neck. COMPARISON:  CTA neck earlier today. FINDINGS: Criteria: Quantification of carotid stenosis is based on velocity parameters that correlate the  residual internal carotid diameter with NASCET-based stenosis levels, using the diameter of the distal internal carotid lumen as the denominator for stenosis measurement. The following  velocity measurements were obtained: RIGHT ICA:  71/13 cm/sec CCA:  103/18 cm/sec SYSTOLIC ICA/CCA RATIO:  0.7 DIASTOLIC ICA/CCA RATIO: ECA:  84 cm/sec LEFT ICA: Flow not detected by duplex ultrasound but very limited visualization of the ICA in the neck beyond proximal segment. Cm/sec CCA:  63/6 cm/sec ECA:  129 cm/sec RIGHT CAROTID ARTERY: Mild to moderate calcified plaque at the level of the carotid bulb and proximal right ICA. Estimated right ICA stenosis is less than 50%. RIGHT VERTEBRAL ARTERY: Antegrade flow with normal waveform and velocity. LEFT CAROTID ARTERY: Significant calcified and noncalcified plaque at the left carotid bifurcation. No flow is visible in the proximal left ICA by duplex ultrasound but visualization. By CTA, there clearly is near occlusion with some preserved flow present. LEFT VERTEBRAL ARTERY: Antegrade flow with normal waveform and velocity. IMPRESSION: 1. Mild to moderate plaque at the level of the right carotid bulb and proximal right ICA. Estimated right ICA stenosis is less than 50%. 2. Significant calcified and noncalcified plaque at the left carotid bifurcation. No flow is visible in the proximal left ICA by duplex ultrasound but visualization is very limited. By CTA, there clearly is near occlusion with some preserved flow present. Electronically Signed   By: Marcey Moan M.D.   On: 01/27/2024 16:16   DG Chest Portable 1 View Result Date: 01/27/2024 EXAM: 1 VIEW(S) XRAY OF THE CHEST 01/27/2024 01:29:00 PM COMPARISON: 02/10/2023 CLINICAL HISTORY: cva FINDINGS: LUNGS AND PLEURA: Low lung volumes. No focal pulmonary opacity. No pleural effusion. No pneumothorax. HEART AND MEDIASTINUM: No acute abnormality of the cardiac and mediastinal silhouettes. BONES AND SOFT TISSUES: No acute osseous abnormality. IMPRESSION: 1. Low lung volumes. 2. No acute findings. Electronically signed by: Donnice Mania MD 01/27/2024 01:51 PM EST RP Workstation: [id number]   MR BRAIN WO  CONTRAST Result Date: 01/27/2024 EXAM: MRI BRAIN WITHOUT CONTRAST 01/27/2024 12:53:48 PM TECHNIQUE: Multiplanar multisequence MRI of the head/brain was performed without the administration of intravenous contrast. COMPARISON: Head CT and CTA head and neck 01/27/2024 reported separately. Previous brain MRI 12/08/2021. CLINICAL HISTORY: 73 year old male with acute neuro deficit, stroke suspected. FINDINGS: BRAIN AND VENTRICLES: Patchy acute infarct in the posterior right MCA territory, perirolandic area, involving the superior central gyrus, characterized by a 2 cm area of cortical and subcortical white matter restricted diffusion on series 5 image 42 and early T2 and FLAIR hyperintensity on series 11 image 42. No contralateral or posterior fossa restricted diffusion identified. No hemorrhagic transformation or mass effect. Chronic cortical encephalomalacia in the left middle femoral gyrus, posterior left parietal lobe, left occipital lobe, and also left lateral cerebellum are stable since 2023. A small chronic lacunar infarct in the dorsal left thalamus is stable. Chronic confluent periventricular white matter T2 and FLAIR hyperintensity is stable. No chronic cerebral blood products identified. Otherwise, mild age-related signal heterogeneity in the deep gray nuclei. The brainstem appears negative. Motion degraded sagittal T1 imaging. Chronically abnormal left ICA siphon. But the left ICA flow void at the skull base appears more abnormal compared to 2023. See CTA findings reported separately today. No intracranial hemorrhage. No mass. No midline shift. No hydrocephalus. The sella is unremarkable. ORBITS: No acute abnormality. SINUSES AND MASTOIDS: No acute abnormality. BONES AND SOFT TISSUES: Normal marrow signal. No acute soft tissue abnormality. IMPRESSION: 1. Patchy acute infarct in the posterior right MCA territory, perirolandic  area. No hemorrhagic transformation or mass effect. 2. Otherwise stable chronic  ischemic disease since a 02-12-2022 MRI, most pronounced in the left MCA, left PCA, and left cerebellar artery territories. 3. Abnormal LICA flow void, see CTA findings from today. Electronically signed by: Helayne Hurst MD 01/27/2024 01:09 PM EST RP Workstation: [id number]   CT ANGIO HEAD NECK W WO CM Result Date: 01/27/2024 EXAM: CTA HEAD AND NECK WITHOUT AND WITH 01/27/2024 10:45:08 AM TECHNIQUE: CTA of the head and neck was performed without and with the administration of 75 mL of iohexol  (OMNIPAQUE ) 350 MG/ML injection. Multiplanar 2D and/or 3D reformatted images are provided for review. Automated exposure control, iterative reconstruction, and/or weight based adjustment of the mA/kV was utilized to reduce the radiation dose to as low as reasonably achievable. Stenosis of the internal carotid arteries measured using NASCET criteria. COMPARISON: MRI brain 12/08/2021. CLINICAL HISTORY: Neuro deficit, acute, stroke suspected. FINDINGS: CTA NECK: AORTIC ARCH AND ARCH VESSELS: Aberrant right subclavian artery. Common origin of the right brachiocephalic and left common carotid arteries. No dissection or arterial injury. No significant stenosis of the brachiocephalic or subclavian arteries. CERVICAL CAROTID ARTERIES: Critical stenosis of the proximal left cervical ICA due to predominantly soft plaque seen on axial image 79 series 6. Diminutive appearance of the mid and distal cervical ICA. Mild calcified plaque along the right carotid bulb and proximal right cervical ICA without significant stenosis. No dissection or arterial injury. CERVICAL VERTEBRAL ARTERIES: Left dominant vertebral artery. Limited evaluation of the vertebral artery origins due to streak artifact from the shoulders. No dissection, arterial injury, or significant stenosis of the cervical segments. LUNGS AND MEDIASTINUM: Unremarkable. SOFT TISSUES: No acute abnormality. BONES: Multilevel cervical spondylosis without high grade spinal canal stenosis. No  suspicious bone lesion. CTA HEAD: ANTERIOR CIRCULATION: At least moderate stenosis of the ophthalmic segment of the left ICA due to mixed plaque. Reconstitution of the left ICA terminus. Hypoplastic A1 segment of the left ACA. No significant stenosis of the right ICA. No significant stenosis of the right ACA. No significant stenosis of the middle cerebral arteries. No aneurysm. POSTERIOR CIRCULATION: Mild calcified plaque along the V4 segment of the nondominant right vertebral artery. No significant stenosis of the posterior cerebral arteries. No significant stenosis of the basilar artery. No significant stenosis of the vertebral arteries. No aneurysm. OTHER: No dural venous sinus thrombosis on this non-dedicated study. IMPRESSION: 1. No acute large vessel occlusion. 2. Critical stenosis of the proximal left cervical ICA due to predominantly soft plaque, with diminutive appearance of the mid and distal cervical ICA. 3. At least moderate stenosis of the ophthalmic segment of the left ICA due to mixed plaque. Electronically signed by: Ryan Chess MD 01/27/2024 11:15 AM EST RP Workstation: [id number]   CT HEAD WO CONTRAST Result Date: 01/27/2024 EXAM: CT HEAD WITHOUT CONTRAST 01/27/2024 10:36:40 AM TECHNIQUE: CT of the head was performed without the administration of intravenous contrast. Automated exposure control, iterative reconstruction, and/or weight based adjustment of the mA/kV was utilized to reduce the radiation dose to as low as reasonably achievable. COMPARISON: CT head 12/08/2021. MRI brain 12/08/2021. CLINICAL HISTORY: Neuro deficit, acute, stroke suspected. FINDINGS: BRAIN AND VENTRICLES: No acute hemorrhage. No evidence of acute infarct. Chronic infarct in the left occipital lobe. Chronic infarct along the left middle frontal gyrus. Moderate chronic microvascular ischemic change. No hydrocephalus. No extra-axial collection. No mass effect or midline shift. [place] Stroke Program Early CT Score  (ASPECTS) ----- Ganglionic (caudate, IC, lentiform nucleus, insula, M1-M3): 7 Supraganglionic (M4-M6):  3 Total: 10 ORBITS: No acute abnormality. SINUSES: No acute abnormality. SOFT TISSUES AND SKULL: No acute soft tissue abnormality. No skull fracture. IMPRESSION: 1. No acute intracranial abnormality. ASPECTS: 10. 2. Chronic infarct in the left occipital lobe and along the left middle frontal gyrus. Electronically signed by: Ryan Chess MD 01/27/2024 11:06 AM EST RP Workstation: [id number]    Microbiology: Results for orders placed or performed during the hospital encounter of 10/13/23  Urine Culture     Status: Abnormal   Collection Time: 10/13/23  6:35 PM   Specimen: Urine, Clean Catch  Result Value Ref Range Status   Specimen Description   Final    URINE, CLEAN CATCH Performed at [facility], [address], [place], [place] [address]    Special Requests   Final    NONE Performed at [facility], [address] [place], [place] [address]    Culture (A)  Final    <10,000 COLONIES/mL INSIGNIFICANT GROWTH Performed at [facility], [address] [place], [place] [address]    Report Status 10/14/2023 FINAL  Final    Labs: CBC: Recent Labs  Lab 01/27/24 1002 01/28/24 0419  WBC 10.9* 11.0*  NEUTROABS 6.5  --   HGB 14.8 14.4  HCT 45.9 43.7  MCV 96.8 96.3  PLT 276 245   Basic Metabolic Panel: Recent Labs  Lab 01/27/24 1002 01/28/24 0419 01/29/24 0412  NA 136 139 139  K 4.4 3.8 3.9  CL 101 103 103  CO2 22 26 26   GLUCOSE 269* 157* 107*  BUN 21 18 14   CREATININE 1.45* 1.21 1.31*  CALCIUM  9.6 9.1 9.2   Liver Function Tests: Recent Labs  Lab 01/27/24 1002  AST 23  ALT 19  ALKPHOS 93  BILITOT 0.6  PROT 7.1  ALBUMIN 4.3   CBG: Recent Labs  Lab 01/29/24 1118 01/29/24 1600 01/29/24 2118 01/30/24 0740 01/30/24 1121  GLUCAP 157* 216* 116* 136* 138*    Discharge time spent:  35 minutes.  Signed: Eric Nunnery, MD [facility] 01/30/2024

## 2024-01-30 NOTE — Plan of Care (Signed)

## 2024-01-30 NOTE — Evaluation (Addendum)
 Occupational Therapy Evaluation Patient Details Name: Kevin Goodman MRN: [id number] DOB: 1950-05-13 Today's Date: 01/30/2024   History of Present Illness   Kevin Goodman is a 73 y.o. 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     Patient went to bed at his baseline around 11 PM.  He woke up around 4 AM and was not feeling well, thinking that his blood pressure might be high, also noticed weakness of left arm and to lesser extent to the left leg.  No change in vision or headache.  Patient called his son who called the EMS.     Patient denies any chest pain, shortness of breath, any other recent illness.  No recent change in appetite or bowel habit.  No urinary symptoms.     Patient denies any smoking or alcohol  use.     Clinical Impressions Pt admitted for concerns listed above. PTA pt reported independence with all ADL's and IADL's, including working in security. At this time, pt does feel that his L hand is weaker than his right, however upon gross testing his grip is [facility] and his sensation in intact. Pt also demonstrates good coordination and balance. He requires no further skilled OT at this time and will be discharged from OT.      If plan is discharge home, recommend the following:         Functional Status Assessment   Patient has had a recent decline in their functional status and demonstrates the ability to make significant improvements in function in a reasonable and predictable amount of time.     Equipment Recommendations   None recommended by OT     Recommendations for Other Services         Precautions/Restrictions   Precautions Precautions: Fall Recall of Precautions/Restrictions: Intact Restrictions Weight Bearing Restrictions Per Provider Order: No     Mobility Bed  Mobility Overal bed mobility: Modified Independent             General bed mobility comments: No concerns    Transfers Overall transfer level: Modified independent Equipment used: None Transfers: Sit to/from Stand Sit to Stand: Independent           General transfer comment: No assist required, mild effort to power up      Balance Overall balance assessment: Mild deficits observed, not formally tested                                         ADL either performed or assessed with clinical judgement   ADL Overall ADL's : At baseline;Independent                                       General ADL Comments: Pt is Indep with all ADL's     Vision Baseline Vision/History: 1 Wears glasses Ability to See in Adequate Light: 0 Adequate Patient Visual Report: No change from baseline Vision Assessment?: No apparent visual deficits     Perception         Praxis         Pertinent Vitals/Pain Pain Assessment Pain Assessment: No/denies pain     Extremity/Trunk  Assessment Upper Extremity Assessment Upper Extremity Assessment: Overall WFL for tasks assessed   Lower Extremity Assessment Lower Extremity Assessment: Overall WFL for tasks assessed   Cervical / Trunk Assessment Cervical / Trunk Assessment: Normal   Communication Communication Communication: No apparent difficulties   Cognition Arousal: Alert Behavior During Therapy: WFL for tasks assessed/performed Cognition: No apparent impairments                               Following commands: Intact       Cueing  General Comments   Cueing Techniques: Verbal cues;Tactile cues;Visual cues  VSS on RA   Exercises     Shoulder Instructions      Home Living Family/patient expects to be discharged to:: Private residence Living Arrangements: Children Available Help at Discharge: Family;Available PRN/intermittently Type of Home: House Home Access: Stairs to  enter [employer] of Steps: 8 Entrance Stairs-Rails: Right Home Layout: Two level;Able to live on main level with bedroom/bathroom     Bathroom Shower/Tub: Chief Strategy Officer: Standard Bathroom Accessibility: Yes How Accessible: Accessible via walker Home Equipment: Cane - single point          Prior Functioning/Environment Prior Level of Function : Independent/Modified Independent;Working/employed;Driving             Mobility Comments: 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, still working full time hours, still driving, reports no falls ADLs Comments: Independent with all ADLs, son assists with iADLs    OT Problem List: Decreased activity tolerance;Decreased coordination   OT Treatment/Interventions:        OT Goals(Current goals can be found in the care plan section)   Acute Rehab OT Goals Patient Stated Goal: I'm ready to go home OT Goal Formulation: With patient Time For Goal Achievement: 01/30/24 Potential to Achieve Goals: Good   OT Frequency:       Co-evaluation              AM-PAC OT 6 Clicks Daily Activity     Outcome Measure Help from another person eating meals?: None Help from another person taking care of personal grooming?: None Help from another person toileting, which includes using toliet, bedpan, or urinal?: None Help from another person bathing (including washing, rinsing, drying)?: None Help from another person to put on and taking off regular upper body clothing?: None Help from another person to put on and taking off regular lower body clothing?: None 6 Click Score: 24   End of Session Nurse Communication: Mobility status  Activity Tolerance: Patient tolerated treatment well Patient left: in bed;with call bell/phone within reach  OT Visit Diagnosis: Unsteadiness on feet (R26.81);Other abnormalities of gait and mobility (R26.89)                Time: 9181-9161 OT Time Calculation (min): 20  min Charges:  OT General Charges $OT Visit: 1 Visit OT Evaluation $OT Eval Low Complexity: 1 Low  Kevin Goodman, OTR/L [facility] Goodman Acute Rehab  Kevin Goodman 01/30/2024, 3:11 PM

## 2024-01-30 NOTE — Progress Notes (Signed)
 SLP Cancellation Note  Patient Details Name: Kevin Goodman MRN: [id number] DOB: [birth year]   Cancelled treatment:       Reason Eval/Treat Not Completed: SLP screened, no needs identified, will sign off; SLP screened Pt in room. Pt denies any changes in swallowing, speech, language, or cognition. SLE will be deferred at this time. Reconsult if indicated. SLP will sign off.   Thank you,  Lamar Candy, CCC-SLP [phone]  Sunil Hue 01/30/2024, 3:10 PM

## 2024-01-30 NOTE — Progress Notes (Signed)
 Mobility Specialist Progress Note:    01/30/24 1020  Mobility  Activity Ambulated with assistance  Level of Assistance Modified independent, requires aide device or extra time  Assistive Device None  Distance Ambulated (ft) 170 ft  Range of Motion/Exercises Active;All extremities  Activity Response Tolerated well  Mobility Referral Yes  Mobility visit 1 Mobility  Mobility Specialist Start Time (ACUTE ONLY) 1020  Mobility Specialist Stop Time (ACUTE ONLY) 1040  Mobility Specialist Time Calculation (min) (ACUTE ONLY) 20 min   Pt received in bed, agreeable to mobility. ModI to stand and ambulate with no AD. Tolerated well, slight SOB that pt states is normal. Returned supine, all needs met.  Kevin Goodman Mobility Specialist Please contact via Special Educational Needs Teacher or  Rehab office at [phone]

## 2024-01-30 NOTE — Consult Note (Signed)
 I connected with  Kevin Goodman on 01/30/24 by a video enabled telemedicine application and verified that I am speaking with the correct person using two identifiers.   I discussed the limitations of evaluation and management by telemedicine. The patient expressed understanding and agreed to proceed.  NEUROLOGY CONSULT NOTE   Date of service: January 30, 2024 Patient Name: Kevin Goodman MRN:  [id number] DOB:  March 29, 1950 Chief Complaint: Transient LUE numbness  Requesting Provider: No att. providers found  History of Present Illness  Kevin Goodman is a 73 y.o. male with hx of afib (on Eliquis ), DM, HTN, OSA who presented with transient LUE numbness on 01/27/2024, found to have acute infarct on MRI.  Patient reports he went to sleep at 11pm. He woke up at 4am and didn't feel right, thinking his blood pressure was high. When he got up, he noticed that his left arm wasn't moving normally. He denies weakness but rather felt numbness of his arm. Denies left leg involvement too. However, chart review reports LUE>LLE weakness.   MRI brain in ED with acute infarct in posterior R MCA. Prior infarcts in the L MCA, L PCA and L cerebellar territories noted, however patient reports not having experienced any stroke-like symptoms prior to this.  Denied any missed doses of Eliquis .   Neurology was teleconsulted on 11/24.  LKW: 11pm 01/26/2024 Modified rankin score: 0-Completely asymptomatic and back to baseline post- stroke IV Thrombolysis: No (resolved, out of window) EVT: No (resolved, out of window) ICH Score:N/A  NIHSS components Score: Comment  1a Level of Conscious 0[x]  1[]  2[]  3[]      1b LOC Questions 0[x]  1[]  2[]       1c LOC Commands 0[x]  1[]  2[]       2 Best Gaze 0[x]  1[]  2[]       3 Visual 0[x]  1[]  2[]  3[]      4 Facial Palsy 0[x]  1[]  2[]  3[]      5a Motor Arm - left 0[x]  1[]  2[]  3[]  4[]  UN[]    5b Motor Arm - Right 0[x]  1[]  2[]  3[]  4[]  UN[]    6a Motor Leg - Left 0[x]  1[]  2[]  3[]   4[]  UN[]    6b Motor Leg - Right 0[x]  1[]  2[]  3[]  4[]  UN[]    7 Limb Ataxia 0[x]  1[]  2[]  UN[]      8 Sensory 0[x]  1[]  2[]  UN[]      9 Best Language 0[x]  1[]  2[]  3[]      10 Dysarthria 0[x]  1[]  2[]  UN[]      11 Extinct. and Inattention 0[x]  1[]  2[]       TOTAL:       ROS  Comprehensive ROS performed and pertinent positives documented in HPI    Past History   Past Medical History:  Diagnosis Date   Atrial fibrillation (HCC)    a. initialy occurring in 2013  b. recurrence in 06/2020 in the setting of COVID-19 --> s/p DCCV in 07/2020   Chronic kidney disease, stage 3a (HCC)    Diabetes mellitus, type 2 (HCC)    Dysrhythmia    GERD (gastroesophageal reflux disease)    Gout    Hypertension    Mild pulmonary hypertension (HCC)    Morbid obesity (HCC)    OSA (obstructive sleep apnea)    Paroxysmal atrial flutter (HCC)    Sleep apnea     Past Surgical History:  Procedure Laterality Date   BALLOON DILATION N/A 12/25/2020   Procedure: BALLOON DILATION;  Surgeon: Cindie Carlin POUR, DO;  Location: [facility];  Service: Endoscopy;  Laterality: N/A;   BIOPSY  12/25/2020   Procedure: BIOPSY;  Surgeon: Cindie Carlin POUR, DO;  [facility];  Service: Endoscopy;;   BIOPSY  08/05/2022   Procedure: BIOPSY;  Surgeon: Cindie Carlin POUR, DO;  [facility]: [facility];  Service: Endoscopy;;   CARDIOVERSION N/A 07/09/2020   Procedure: CARDIOVERSION;  Surgeon: Alvan Dorn FALCON, MD;  Location: [facility];  Service: Endoscopy;  Laterality: N/A;   COLONOSCOPY N/A 12/08/2016   sigmoid and descending colon diverticula, four semi-pedunculated polyps in descending and ascendign colon, few polyps in descending and ascending colon (tubular adenomas). 3 year surveillance.    COLONOSCOPY WITH PROPOFOL  N/A 12/25/2020   Surgeon: Cindie Carlin K, DO; Nonbleeding internal hemorrhoids, pancolonic diverticulosis, 2 mm tubular adenoma.  Recommended repeat in 5 years.   COLONOSCOPY WITH PROPOFOL  N/A 08/05/2022    Procedure: COLONOSCOPY WITH PROPOFOL ;  Surgeon: Cindie Carlin POUR, DO;  [facility]: [facility];  Service: Endoscopy;  Laterality: N/A;  7:30 am, ASA 3   ESOPHAGOGASTRODUODENOSCOPY N/A 12/08/2016   normal esophagus s/p empiric dilation, small hiatal hernia.    ESOPHAGOGASTRODUODENOSCOPY (EGD) WITH PROPOFOL  N/A 12/25/2020   Surgeon: Cindie Carlin POUR, DO;  Normal esophagus s/p empiric dilation, gastritis biopsied, normal examined duodenum.  Pathology with slight chronic inflammation, no H. pylori.   ESOPHAGOGASTRODUODENOSCOPY (EGD) WITH PROPOFOL  N/A 08/05/2022   Procedure: ESOPHAGOGASTRODUODENOSCOPY (EGD) WITH PROPOFOL ;  Surgeon: Cindie Carlin POUR, DO;  [facility];  Service: Endoscopy;  Laterality: N/A;   FINGER SURGERY     left ring   INCISION AND DRAINAGE OF WOUND Left 04/20/2016   Procedure: LEFT THIGH DEBRIDEMENT;  Surgeon: Herlene Beverley Bureau, MD;  Location: [facility];  Service: General;  Laterality: Left;   MALONEY DILATION N/A 12/08/2016   Procedure: AGAPITO HODGKIN;  Surgeon: Shaaron Lamar HERO, MD;  [facility];  Service: Endoscopy;  Laterality: N/A;   POLYPECTOMY  12/08/2016   Procedure: POLYPECTOMY;  Surgeon: Shaaron Lamar HERO, MD;  [facility];  Service: Endoscopy;;  colon   POLYPECTOMY  12/25/2020   Procedure: POLYPECTOMY;  Surgeon: Cindie Carlin POUR, DO;  [facility];  Service: Endoscopy;;   POLYPECTOMY  08/05/2022   Procedure: POLYPECTOMY;  Surgeon: Cindie Carlin POUR, DO;  [facility];  Service: Endoscopy;;    Family History: Family History  Problem Relation Age of Onset   Heart failure Mother    Hypertension Mother    Hypertension Father    Stroke Father    Prostate cancer Father    Stroke Brother    Prostate cancer Brother    Colon cancer Neg Hx    Colon polyps Neg Hx     Social History  reports that he has been smoking cigars. He has been exposed to tobacco smoke. He has never used smokeless tobacco. He  reports that he does not currently use alcohol . He reports that he does not use drugs.  No Known Allergies  Medications   Current Facility-Administered Medications:    acetaminophen  (TYLENOL ) tablet 650 mg, 650 mg, Oral, Q6H PRN, 650 mg at 01/30/24 0611 **OR** acetaminophen  (TYLENOL ) suppository 650 mg, 650 mg, Rectal, Q6H PRN, Amin, Sumayya, MD   allopurinol  (ZYLOPRIM ) tablet 200 mg, 200 mg, Oral, Daily, Amin, Sumayya, MD, 200 mg at 01/30/24 0854   apixaban  (ELIQUIS ) tablet 5 mg, 5 mg, Oral, BID, Amin, Sumayya, MD, 5 mg at 01/30/24 0854   atorvastatin  (LIPITOR) tablet 20 mg, 20 mg, Oral, Daily, Amin, Sumayya,  MD, 20 mg at 01/30/24 0854   finasteride  (PROSCAR ) tablet 5 mg, 5 mg, Oral, Daily, Amin, Sumayya, MD, 5 mg at 01/30/24 9143   hydrALAZINE  (APRESOLINE ) tablet 50 mg, 50 mg, Oral, BID, Arrien, Mauricio Daniel, MD, 50 mg at 01/30/24 9145   insulin  aspart (novoLOG ) injection 0-15 Units, 0-15 Units, Subcutaneous, TID WC, Amin, Sumayya, MD, 2 Units at 01/30/24 1138   insulin  aspart (novoLOG ) injection 0-5 Units, 0-5 Units, Subcutaneous, QHS, Amin, Sumayya, MD, 2 Units at 01/27/24 2213   insulin  glargine-yfgn (SEMGLEE ) injection 20 Units, 20 Units, Subcutaneous, QHS, Amin, Sumayya, MD, 20 Units at 01/29/24 2204   losartan  (COZAAR ) tablet 100 mg, 100 mg, Oral, Daily, Arrien, Mauricio Daniel, MD, 100 mg at 01/30/24 0854   melatonin tablet 6 mg, 6 mg, Oral, QHS PRN, Shona Laurence N, DO, 6 mg at 01/29/24 2347   metoprolol  tartrate (LOPRESSOR ) tablet 50 mg, 50 mg, Oral, BID, Arrien, Mauricio Daniel, MD, 50 mg at 01/30/24 9144   Muscle Rub CREA, , Topical, PRN, Amin, Sumayya, MD, 1 Application at 01/27/24 2213   ondansetron  (ZOFRAN ) tablet 4 mg, 4 mg, Oral, Q6H PRN, 4 mg at 01/30/24 0613 **OR** ondansetron  (ZOFRAN ) injection 4 mg, 4 mg, Intravenous, Q6H PRN, Amin, Sumayya, MD   Oral care mouth rinse, 15 mL, Mouth Rinse, PRN, Amin, Sumayya, MD   pantoprazole  (PROTONIX ) EC tablet 40 mg, 40 mg, Oral, BID  AC, Amin, Sumayya, MD, 40 mg at 01/30/24 0854   polyethylene glycol (MIRALAX  / GLYCOLAX ) packet 17 g, 17 g, Oral, Daily PRN, Amin, Sumayya, MD  Current Outpatient Medications:    acetaminophen  (TYLENOL ) 500 MG tablet, Take 1,000 mg by mouth every 6 (six) hours as needed for moderate pain., Disp: , Rfl:    Allopurinol  200 MG TABS, Take 1 tablet by mouth daily., Disp: , Rfl:    apixaban  (ELIQUIS ) 5 MG TABS tablet, Take 1 tablet (5 mg total) by mouth 2 (two) times daily., Disp: 60 tablet, Rfl: 1   B-D UF III MINI PEN NEEDLES 31G X 5 MM MISC, , Disp: , Rfl:    diltiazem  (TIAZAC ) 360 MG 24 hr capsule, Take 1 capsule (360 mg total) by mouth daily., Disp: , Rfl:    ferrous sulfate  325 (65 FE) MG EC tablet, Take 325 mg by mouth daily., Disp: , Rfl:    finasteride  (PROSCAR ) 5 MG tablet, Take 5 mg by mouth daily., Disp: , Rfl:    furosemide  (LASIX ) 40 MG tablet, Take 40 mg by mouth 2 (two) times daily., Disp: , Rfl:    hydrALAZINE  (APRESOLINE ) 100 MG tablet, Take 100 mg by mouth 2 (two) times daily., Disp: , Rfl:    losartan  (COZAAR ) 100 MG tablet, Take 1 tablet (100 mg total) by mouth daily., Disp: 90 tablet, Rfl: 3   metoprolol  tartrate (LOPRESSOR ) 50 MG tablet, Take 1 tablet (50 mg total) by mouth 2 (two) times daily., Disp: , Rfl:    pantoprazole  (PROTONIX ) 40 MG tablet, Take 1 tablet (40 mg total) by mouth 2 (two) times daily before a meal. 30 minutes before breakfast, Disp: 180 tablet, Rfl: 1   TRESIBA  FLEXTOUCH 100 UNIT/ML FlexTouch Pen, Inject 40 Units into the skin every evening., Disp: , Rfl:    atorvastatin  (LIPITOR) 40 MG tablet, Take 1 tablet (40 mg total) by mouth daily., Disp: 30 tablet, Rfl: 2  Vitals   Vitals:   01/29/24 2127 01/30/24 0000 01/30/24 0434 01/30/24 0854  BP: 130/75 124/74 130/87 132/83  Pulse:  (!) 101 85  99  Resp:  18 20   Temp:  98.6 F (37 C) 97.9 F (36.6 C)   TempSrc:  Oral Oral   SpO2:  96% 94% 93%  Weight:      Height:        Body mass index is 53.08  kg/m.   Physical Exam  Limited by nature of teleconsult.  Constitutional: Appears well-developed and well-nourished. In no acute distress. Psych: Affect appropriate to situation.  Eyes: No scleral injection.  HENT: No OP obstruction.  Head: Normocephalic.  Cardiovascular: Appears well perfused.  Respiratory: Effort normal, non-labored breathing.  GI: Deferred Skin: WDI.   Neurologic Examination   Mental status: Alert and oriented x4. Able to provide history. Speech: No dysarthria or aphasia.  Cranial nerves: EOMI. Denies diplopia. Face appears symmetric at rest and with activation. Denies any facial numbness. Hearing grossly intact. Shoulders elevated symmetrically. Tongue protrudes midline with full ROM.  No pronator drift. Denies any weakness. Denies any numbness to light touch.  No limb ataxia when reaching for objects.  Labs/Imaging/Neurodiagnostic studies   CBC:  Recent Labs  Lab Jan 28, 2024 1002 01/28/24 0419  WBC 10.9* 11.0*  NEUTROABS 6.5  --   HGB 14.8 14.4  HCT 45.9 43.7  MCV 96.8 96.3  PLT 276 245   Basic Metabolic Panel:  Lab Results  Component Value Date   NA 139 01/29/2024   K 3.9 01/29/2024   CO2 26 01/29/2024   GLUCOSE 107 (H) 01/29/2024   BUN 14 01/29/2024   CREATININE 1.31 (H) 01/29/2024   CALCIUM  9.2 01/29/2024   GFRNONAA 58 (L) 01/29/2024   GFRAA >60 04/22/2016   Lipid Panel:  Lab Results  Component Value Date   LDLCALC 41 01/28/2024   HgbA1c:  Lab Results  Component Value Date   HGBA1C 9.1 (H) 01/28/2024   Urine Drug Screen:     Component Value Date/Time   LABOPIA NEGATIVE 01-28-2024 1317   COCAINSCRNUR NEGATIVE 01/28/24 1317   LABBENZ NEGATIVE 01-28-2024 1317   AMPHETMU NEGATIVE 2024/01/28 1317   THCU NEGATIVE January 28, 2024 1317   LABBARB NEGATIVE Jan 28, 2024 1317    Alcohol  Level     Component Value Date/Time   ETH <15 01-28-2024 1001   INR  Lab Results  Component Value Date   INR 1.3 (H) 01/28/2024   APTT   Lab Results  Component Value Date   APTT 39 (H) 01/28/2024   AED levels: No results found for: PHENYTOIN, ZONISAMIDE, LAMOTRIGINE, LEVETIRACETA  MRI brain w/o contrast January 28, 2024, Personally reviewed): DWI with ADC correlate of punctate infarcts in the R perirolandic region.  Encephalomalacia in L middle femoral gyrus, L parietal, L occipital and L cerebellum.  CT angio Head and Neck with contrast(11/21, Personally reviewed): No LVO. Severe stenosis of L cervical ICA. Mild to moderate of R proximal ICA.   Carotid US  2024/01/28): IMPRESSION: 1. Mild to moderate plaque at the level of the right carotid bulb and proximal right ICA. Estimated right ICA stenosis is less than 50%. 2. Significant calcified and noncalcified plaque at the left carotid bifurcation. No flow is visible in the proximal left ICA by duplex ultrasound but visualization is very limited. By CTA, there clearly is near occlusion with some preserved flow present.   ASSESSMENT   Kevin Goodman is a 73 y.o. male with hx of afib (on Eliquis ), DM, HTN, OSA who presented with left arm numbness with documented weakness, now resolved, found to have acute infarcts on MRI of the right perirolandic region.  Stroke risk factors: afib, DM, HTN, prior strokes (incidentally found on imaging), carotid disease.  CTA neck with severe stenosis and US  carotids also showing significant stenosis obtained on 11/21. Patient has been admitted since then and kept over the weekend, essentially being monitored for stability of plaque, with improving exam without right sided deficits.  Echocardiogram without thrombus.  RECOMMENDATIONS  - Continue Eliquis  - Increase atorvastatin  to high intensity (40mg )  - LDL 41 and therefore at goal, however due to age would recommend increasing dose - A1C 9.1  - Recommend increased control as per PCP - Referral to Vascular  Surgery ______________________________________________________________________    Signed, Normie CHRISTELLA Blower, MD [facility]

## 2024-04-25 ENCOUNTER — Inpatient Hospital Stay

## 2024-05-02 ENCOUNTER — Inpatient Hospital Stay: Admitting: Oncology

## 2024-05-02 ENCOUNTER — Ambulatory Visit: Admitting: Oncology
